# Patient Record
Sex: Female | Born: 1950 | Race: Black or African American | Hispanic: No | Smoking: Former smoker
Health system: Southern US, Community
[De-identification: ages and names within clinical notes are randomized; demographics above are authoritative.]

## PROBLEM LIST (undated history)

## (undated) DIAGNOSIS — I10 Essential (primary) hypertension: Secondary | ICD-10-CM

## (undated) DIAGNOSIS — E119 Type 2 diabetes mellitus without complications: Secondary | ICD-10-CM

## (undated) HISTORY — PX: REPLACEMENT TOTAL KNEE: SUR1224

## (undated) HISTORY — PX: TONSILLECTOMY: SUR1361

---

## 1998-08-17 ENCOUNTER — Encounter: Admission: RE | Admit: 1998-08-17 | Discharge: 1998-11-15 | Payer: Self-pay | Admitting: Internal Medicine

## 1999-12-26 ENCOUNTER — Encounter: Admission: RE | Admit: 1999-12-26 | Discharge: 1999-12-26 | Payer: Self-pay | Admitting: Orthopedic Surgery

## 1999-12-26 ENCOUNTER — Encounter: Payer: Self-pay | Admitting: Orthopedic Surgery

## 2001-02-07 ENCOUNTER — Emergency Department (HOSPITAL_COMMUNITY): Admission: EM | Admit: 2001-02-07 | Discharge: 2001-02-07 | Payer: Self-pay | Admitting: Emergency Medicine

## 2001-02-07 ENCOUNTER — Encounter: Payer: Self-pay | Admitting: Emergency Medicine

## 2001-02-19 ENCOUNTER — Encounter: Payer: Self-pay | Admitting: Internal Medicine

## 2001-02-19 ENCOUNTER — Ambulatory Visit (HOSPITAL_COMMUNITY): Admission: RE | Admit: 2001-02-19 | Discharge: 2001-02-19 | Payer: Self-pay | Admitting: Internal Medicine

## 2001-06-11 ENCOUNTER — Encounter: Payer: Self-pay | Admitting: Internal Medicine

## 2001-06-11 ENCOUNTER — Encounter: Admission: RE | Admit: 2001-06-11 | Discharge: 2001-06-11 | Payer: Self-pay | Admitting: Internal Medicine

## 2001-09-16 ENCOUNTER — Emergency Department (HOSPITAL_COMMUNITY): Admission: EM | Admit: 2001-09-16 | Discharge: 2001-09-16 | Payer: Self-pay | Admitting: Emergency Medicine

## 2001-10-19 ENCOUNTER — Emergency Department (HOSPITAL_COMMUNITY): Admission: EM | Admit: 2001-10-19 | Discharge: 2001-10-19 | Payer: Self-pay | Admitting: *Deleted

## 2002-05-11 ENCOUNTER — Ambulatory Visit (HOSPITAL_COMMUNITY): Admission: RE | Admit: 2002-05-11 | Discharge: 2002-05-11 | Payer: Self-pay | Admitting: Oncology

## 2002-05-11 ENCOUNTER — Encounter (HOSPITAL_COMMUNITY): Payer: Self-pay | Admitting: Oncology

## 2002-05-27 ENCOUNTER — Ambulatory Visit: Admission: RE | Admit: 2002-05-27 | Discharge: 2002-05-27 | Payer: Self-pay | Admitting: Oncology

## 2002-07-03 ENCOUNTER — Ambulatory Visit (HOSPITAL_BASED_OUTPATIENT_CLINIC_OR_DEPARTMENT_OTHER): Admission: RE | Admit: 2002-07-03 | Discharge: 2002-07-03 | Payer: Self-pay | Admitting: Oncology

## 2002-09-09 ENCOUNTER — Encounter: Payer: Self-pay | Admitting: Internal Medicine

## 2002-09-09 ENCOUNTER — Encounter: Admission: RE | Admit: 2002-09-09 | Discharge: 2002-09-09 | Payer: Self-pay | Admitting: Internal Medicine

## 2002-09-23 ENCOUNTER — Other Ambulatory Visit: Admission: RE | Admit: 2002-09-23 | Discharge: 2002-09-23 | Payer: Self-pay | Admitting: Internal Medicine

## 2003-08-19 ENCOUNTER — Encounter: Admission: RE | Admit: 2003-08-19 | Discharge: 2003-08-19 | Payer: Self-pay | Admitting: Orthopedic Surgery

## 2003-11-14 ENCOUNTER — Ambulatory Visit: Admission: RE | Admit: 2003-11-14 | Discharge: 2004-01-08 | Payer: Self-pay | Admitting: *Deleted

## 2003-11-21 ENCOUNTER — Emergency Department (HOSPITAL_COMMUNITY): Admission: EM | Admit: 2003-11-21 | Discharge: 2003-11-21 | Payer: Self-pay | Admitting: *Deleted

## 2005-04-08 ENCOUNTER — Emergency Department (HOSPITAL_COMMUNITY): Admission: EM | Admit: 2005-04-08 | Discharge: 2005-04-08 | Payer: Self-pay | Admitting: Emergency Medicine

## 2007-01-07 ENCOUNTER — Emergency Department (HOSPITAL_COMMUNITY): Admission: EM | Admit: 2007-01-07 | Discharge: 2007-01-07 | Payer: Self-pay | Admitting: Emergency Medicine

## 2007-01-25 ENCOUNTER — Emergency Department (HOSPITAL_COMMUNITY): Admission: EM | Admit: 2007-01-25 | Discharge: 2007-01-25 | Payer: Self-pay | Admitting: Emergency Medicine

## 2007-12-26 ENCOUNTER — Encounter: Admission: RE | Admit: 2007-12-26 | Discharge: 2007-12-26 | Payer: Self-pay | Admitting: Orthopaedic Surgery

## 2008-08-19 ENCOUNTER — Encounter: Admission: RE | Admit: 2008-08-19 | Discharge: 2008-08-19 | Payer: Self-pay | Admitting: Orthopaedic Surgery

## 2008-12-21 ENCOUNTER — Encounter: Admission: RE | Admit: 2008-12-21 | Discharge: 2008-12-21 | Payer: Self-pay | Admitting: Internal Medicine

## 2009-06-17 ENCOUNTER — Emergency Department (HOSPITAL_COMMUNITY): Admission: EM | Admit: 2009-06-17 | Discharge: 2009-06-17 | Payer: Self-pay | Admitting: Emergency Medicine

## 2009-10-23 ENCOUNTER — Inpatient Hospital Stay (HOSPITAL_COMMUNITY): Admission: RE | Admit: 2009-10-23 | Discharge: 2009-10-28 | Payer: Self-pay | Admitting: Orthopaedic Surgery

## 2009-12-25 ENCOUNTER — Ambulatory Visit (HOSPITAL_COMMUNITY): Admission: RE | Admit: 2009-12-25 | Discharge: 2009-12-26 | Payer: Self-pay | Admitting: Orthopaedic Surgery

## 2010-10-21 LAB — COMPREHENSIVE METABOLIC PANEL
ALT: 24 U/L (ref 0–35)
CO2: 27 mEq/L (ref 19–32)
Calcium: 9.1 mg/dL (ref 8.4–10.5)
Chloride: 102 mEq/L (ref 96–112)
Creatinine, Ser: 0.72 mg/dL (ref 0.4–1.2)
GFR calc non Af Amer: >60 mL/min (ref >60)
Glucose, Bld: 167 mg/dL — ABNORMAL HIGH (ref 70–99)
Total Bilirubin: 0.6 mg/dL (ref 0.3–1.2)

## 2010-10-21 LAB — GLUCOSE, CAPILLARY
Glucose-Capillary: 109 mg/dL — ABNORMAL HIGH (ref 70–99)
Glucose-Capillary: 142 mg/dL — ABNORMAL HIGH (ref 70–99)

## 2010-10-25 LAB — CBC
HCT: 40 % (ref 36.0–46.0)
HCT: 42.1 % (ref 36.0–46.0)
HCT: 49.4 % — ABNORMAL HIGH (ref 36.0–46.0)
Hemoglobin: 13.2 g/dL (ref 12.0–15.0)
MCHC: 33.8 g/dL (ref 30.0–36.0)
MCV: 88.4 fL (ref 78.0–100.0)
MCV: 89.1 fL (ref 78.0–100.0)
MCV: 89.4 fL (ref 78.0–100.0)
Platelets: 185 10*3/uL (ref 150–400)
Platelets: 203 10*3/uL (ref 150–400)
Platelets: 223 10*3/uL (ref 150–400)
RBC: 4.11 MIL/uL (ref 3.87–5.11)
RDW: 12.4 % (ref 11.5–15.5)
RDW: 12.7 % (ref 11.5–15.5)
RDW: 12.9 % (ref 11.5–15.5)
WBC: 11.6 10*3/uL — ABNORMAL HIGH (ref 4.0–10.5)
WBC: 12.9 10*3/uL — ABNORMAL HIGH (ref 4.0–10.5)
WBC: 9.1 10*3/uL (ref 4.0–10.5)

## 2010-10-25 LAB — DIFFERENTIAL
Basophils Absolute: 0 10*3/uL (ref 0.0–0.1)
Basophils Relative: 0 % (ref 0–1)
Eosinophils Absolute: 0.1 10*3/uL (ref 0.0–0.7)
Eosinophils Relative: 1 % (ref 0–5)
Lymphocytes Relative: 19 % (ref 12–46)
Lymphs Abs: 1.7 10*3/uL (ref 0.7–4.0)
Monocytes Absolute: 0.6 10*3/uL (ref 0.1–1.0)
Monocytes Relative: 7 % (ref 3–12)
Neutro Abs: 6.7 10*3/uL (ref 1.7–7.7)
Neutrophils Relative %: 73 % (ref 43–77)

## 2010-10-25 LAB — GLUCOSE, CAPILLARY
Glucose-Capillary: 102 mg/dL — ABNORMAL HIGH (ref 70–99)
Glucose-Capillary: 143 mg/dL — ABNORMAL HIGH (ref 70–99)
Glucose-Capillary: 152 mg/dL — ABNORMAL HIGH (ref 70–99)
Glucose-Capillary: 181 mg/dL — ABNORMAL HIGH (ref 70–99)
Glucose-Capillary: 197 mg/dL — ABNORMAL HIGH (ref 70–99)
Glucose-Capillary: 221 mg/dL — ABNORMAL HIGH (ref 70–99)
Glucose-Capillary: 251 mg/dL — ABNORMAL HIGH (ref 70–99)
Glucose-Capillary: 253 mg/dL — ABNORMAL HIGH (ref 70–99)
Glucose-Capillary: 253 mg/dL — ABNORMAL HIGH (ref 70–99)
Glucose-Capillary: 257 mg/dL — ABNORMAL HIGH (ref 70–99)
Glucose-Capillary: 327 mg/dL — ABNORMAL HIGH (ref 70–99)
Glucose-Capillary: 65 mg/dL — ABNORMAL LOW (ref 70–99)
Glucose-Capillary: 77 mg/dL (ref 70–99)
Glucose-Capillary: 90 mg/dL (ref 70–99)
Glucose-Capillary: 92 mg/dL (ref 70–99)

## 2010-10-25 LAB — PROTIME-INR
INR: 1.19 (ref 0.00–1.49)
INR: 2.29 — ABNORMAL HIGH (ref 0.00–1.49)
Prothrombin Time: 14.5 seconds (ref 11.6–15.2)
Prothrombin Time: 15 seconds (ref 11.6–15.2)
Prothrombin Time: 18.7 seconds — ABNORMAL HIGH (ref 11.6–15.2)
Prothrombin Time: 20.8 seconds — ABNORMAL HIGH (ref 11.6–15.2)

## 2010-10-25 LAB — BASIC METABOLIC PANEL
BUN: 14 mg/dL (ref 6–23)
BUN: 14 mg/dL (ref 6–23)
BUN: 9 mg/dL (ref 6–23)
CO2: 30 mEq/L (ref 19–32)
Calcium: 8.5 mg/dL (ref 8.4–10.5)
Calcium: 9.8 mg/dL (ref 8.4–10.5)
Chloride: 97 mEq/L (ref 96–112)
Chloride: 98 mEq/L (ref 96–112)
Creatinine, Ser: 0.66 mg/dL (ref 0.4–1.2)
Creatinine, Ser: 0.86 mg/dL (ref 0.4–1.2)
GFR calc non Af Amer: >60 mL/min (ref >60)
GFR calc non Af Amer: >60 mL/min (ref >60)
GFR calc non Af Amer: >60 mL/min (ref >60)
Glucose, Bld: 144 mg/dL — ABNORMAL HIGH (ref 70–99)
Glucose, Bld: 269 mg/dL — ABNORMAL HIGH (ref 70–99)
Glucose, Bld: 270 mg/dL — ABNORMAL HIGH (ref 70–99)
Potassium: 4 mEq/L (ref 3.5–5.1)
Potassium: 5.3 mEq/L — ABNORMAL HIGH (ref 3.5–5.1)
Sodium: 135 mEq/L (ref 135–145)
Sodium: 136 mEq/L (ref 135–145)

## 2010-10-25 LAB — URINALYSIS, ROUTINE W REFLEX MICROSCOPIC
Bilirubin Urine: NEGATIVE
Glucose, UA: NEGATIVE mg/dL
Hgb urine dipstick: NEGATIVE
Ketones, ur: NEGATIVE mg/dL
Nitrite: NEGATIVE
Protein, ur: NEGATIVE mg/dL
Specific Gravity, Urine: 1.021 (ref 1.005–1.030)
Urobilinogen, UA: 1 mg/dL (ref 0.0–1.0)
pH: 6 (ref 5.0–8.0)

## 2010-10-25 LAB — URINE MICROSCOPIC-ADD ON

## 2011-05-21 LAB — URINALYSIS, ROUTINE W REFLEX MICROSCOPIC
Bilirubin Urine: NEGATIVE
Ketones, ur: NEGATIVE
Nitrite: NEGATIVE
Specific Gravity, Urine: 1.035 — ABNORMAL HIGH
Urobilinogen, UA: 0.2

## 2011-05-21 LAB — BASIC METABOLIC PANEL
BUN: 13
Chloride: 98
Glucose, Bld: 307 — ABNORMAL HIGH
Potassium: 4.4

## 2011-07-10 ENCOUNTER — Encounter (INDEPENDENT_AMBULATORY_CARE_PROVIDER_SITE_OTHER): Payer: Federal, State, Local not specified - PPO | Admitting: Ophthalmology

## 2011-07-10 DIAGNOSIS — H43819 Vitreous degeneration, unspecified eye: Secondary | ICD-10-CM

## 2011-07-10 DIAGNOSIS — E11319 Type 2 diabetes mellitus with unspecified diabetic retinopathy without macular edema: Secondary | ICD-10-CM

## 2011-07-10 DIAGNOSIS — E1139 Type 2 diabetes mellitus with other diabetic ophthalmic complication: Secondary | ICD-10-CM

## 2012-02-10 ENCOUNTER — Other Ambulatory Visit (HOSPITAL_COMMUNITY)
Admission: RE | Admit: 2012-02-10 | Discharge: 2012-02-10 | Disposition: A | Discharge status: Home / Self Care | Payer: Federal, State, Local not specified - PPO | Source: Ambulatory Visit | Attending: Internal Medicine | Admitting: Internal Medicine

## 2012-02-10 DIAGNOSIS — Z01419 Encounter for gynecological examination (general) (routine) without abnormal findings: Secondary | ICD-10-CM | POA: Insufficient documentation

## 2013-07-28 ENCOUNTER — Emergency Department (HOSPITAL_COMMUNITY)
Admission: EM | Admit: 2013-07-28 | Discharge: 2013-07-28 | Disposition: A | Discharge status: Home / Self Care | Payer: Federal, State, Local not specified - PPO | Attending: Emergency Medicine | Admitting: Emergency Medicine

## 2013-07-28 ENCOUNTER — Emergency Department (HOSPITAL_COMMUNITY): Payer: Federal, State, Local not specified - PPO

## 2013-07-28 ENCOUNTER — Encounter (HOSPITAL_COMMUNITY): Payer: Self-pay | Admitting: Emergency Medicine

## 2013-07-28 DIAGNOSIS — Z794 Long term (current) use of insulin: Secondary | ICD-10-CM | POA: Insufficient documentation

## 2013-07-28 DIAGNOSIS — J069 Acute upper respiratory infection, unspecified: Secondary | ICD-10-CM | POA: Insufficient documentation

## 2013-07-28 DIAGNOSIS — Z9089 Acquired absence of other organs: Secondary | ICD-10-CM | POA: Insufficient documentation

## 2013-07-28 DIAGNOSIS — E119 Type 2 diabetes mellitus without complications: Secondary | ICD-10-CM | POA: Insufficient documentation

## 2013-07-28 DIAGNOSIS — I10 Essential (primary) hypertension: Secondary | ICD-10-CM | POA: Insufficient documentation

## 2013-07-28 DIAGNOSIS — Z880 Allergy status to penicillin: Secondary | ICD-10-CM | POA: Insufficient documentation

## 2013-07-28 DIAGNOSIS — R110 Nausea: Secondary | ICD-10-CM | POA: Insufficient documentation

## 2013-07-28 DIAGNOSIS — Z79899 Other long term (current) drug therapy: Secondary | ICD-10-CM | POA: Insufficient documentation

## 2013-07-28 DIAGNOSIS — R0789 Other chest pain: Secondary | ICD-10-CM | POA: Insufficient documentation

## 2013-07-28 HISTORY — DX: Essential (primary) hypertension: I10

## 2013-07-28 HISTORY — DX: Type 2 diabetes mellitus without complications: E11.9

## 2013-07-28 LAB — BASIC METABOLIC PANEL
BUN: 15 mg/dL (ref 6–23)
Creatinine, Ser: 0.69 mg/dL (ref 0.50–1.10)
GFR calc Af Amer: >90 mL/min (ref >90)
GFR calc non Af Amer: >90 mL/min (ref >90)

## 2013-07-28 LAB — CBC
HCT: 48.6 % — ABNORMAL HIGH (ref 36.0–46.0)
MCH: 30.3 pg (ref 26.0–34.0)
MCHC: 34.8 g/dL (ref 30.0–36.0)
MCV: 87.3 fL (ref 78.0–100.0)
RDW: 12.1 % (ref 11.5–15.5)

## 2013-07-28 MED ORDER — IPRATROPIUM BROMIDE 0.02 % IN SOLN
0.5000 mg | Freq: Once | RESPIRATORY_TRACT | Status: AC
  Administered 2013-07-28: 0.5 mg via RESPIRATORY_TRACT
  Filled 2013-07-28: qty 2.5

## 2013-07-28 MED ORDER — BENZONATATE 100 MG PO CAPS
200.0000 mg | ORAL_CAPSULE | Freq: Once | ORAL | Status: AC
  Administered 2013-07-28: 200 mg via ORAL
  Filled 2013-07-28: qty 2

## 2013-07-28 MED ORDER — ALBUTEROL SULFATE HFA 108 (90 BASE) MCG/ACT IN AERS: 2.0000 | INHALATION_SPRAY | RESPIRATORY_TRACT | Status: DC | PRN

## 2013-07-28 MED ORDER — ASPIRIN 325 MG PO TABS
325.0000 mg | ORAL_TABLET | ORAL | Status: AC
  Administered 2013-07-28: 325 mg via ORAL
  Filled 2013-07-28: qty 1

## 2013-07-28 MED ORDER — ALBUTEROL SULFATE (5 MG/ML) 0.5% IN NEBU
2.5000 mg | INHALATION_SOLUTION | Freq: Once | RESPIRATORY_TRACT | Status: AC
  Administered 2013-07-28: 2.5 mg via RESPIRATORY_TRACT
  Filled 2013-07-28: qty 0.5

## 2013-07-28 MED ORDER — HYDROCOD POLST-CHLORPHEN POLST 10-8 MG/5ML PO LQCR: 5.0000 mL | Freq: Two times a day (BID) | ORAL | Status: DC | PRN

## 2013-07-28 NOTE — ED Notes (Signed)
Family at bedside. 

## 2013-07-28 NOTE — ED Notes (Signed)
Patient presents today with a chief complaint of shortness of breath, chest congestion, chest tightness, and nausea since Monday 07/25/13. Patient denies fever, vomiting, abdominal pain.  Patient has been taking Delsym OTC without relief of cough.

## 2013-07-28 NOTE — ED Provider Notes (Signed)
CSN: [id number]     Arrival date & time 07/28/13  1643 History   First MD Initiated Contact with Patient 07/28/13 1709     Chief Complaint  Patient presents with  . Chest Pain  . Cough   (Consider location/radiation/quality/duration/timing/severity/associated sxs/prior Treatment) HPI  62 year old female with a history of hypertension and diabetes presents today with chief complaint of shortness of breath chest congestion and chest tightness and nausea since Monday. Patient has had these symptoms ongoing for approximately 4 days now. She has a cough with no production. With her cough she feels a tightness in her chest and has had some mild nausea. Also with her cuff has had some associated shortness of breath. Fever or vomiting. She denies any abdominal pain. She's been taking over-the-counter decongestants without any relief. She presents today for continued symptoms. Patient has also had some generalized myalgias which she states are chronic.  Past Medical History  Diagnosis Date  . Hypertension   . Diabetes mellitus without complication    Past Surgical History  Procedure Laterality Date  . Replacement total knee    . Tonsillectomy     No family history on file. History  Substance Use Topics  . Smoking status: Never Smoker   . Smokeless tobacco: Not on file  . Alcohol Use: Yes   OB History   Grav Para Term Preterm Abortions TAB SAB Ect Mult Living                 Review of Systems  Constitutional: Negative for fever and chills.  HENT: Negative for sore throat.   Eyes: Negative for pain.  Respiratory: Positive for cough, chest tightness and shortness of breath.   Cardiovascular: Negative for chest pain.  Gastrointestinal: Positive for nausea. Negative for vomiting, abdominal pain and diarrhea.  Genitourinary: Negative for dysuria.  Musculoskeletal: Negative for back pain.  Skin: Negative for rash.  Neurological: Negative for numbness and headaches.    Allergies   Amoxicillin and Penicillins  Home Medications   Current Outpatient Rx  Name  Route  Sig  Dispense  Refill  . amLODipine (NORVASC) 10 MG tablet   Oral   Take 10 mg by mouth daily.         Marland Kitchen dextromethorphan (DELSYM) 30 MG/5ML liquid   Oral   Take 30 mg by mouth as needed for cough.         . Diclofenac Sodium POWD   Topical   Apply 1 application topically 3 (three) times daily as needed.         . glyBURIDE-metformin (GLUCOVANCE) 5-500 MG per tablet   Oral   Take 1 tablet by mouth 3 (three) times daily.         Marland Kitchen guaiFENesin (MUCINEX) 600 MG 12 hr tablet   Oral   Take by mouth 2 (two) times daily.         Marland Kitchen LANTUS SOLOSTAR 100 UNIT/ML SOPN   Subcutaneous   Inject 50 Units into the skin at bedtime.         Marland Kitchen lisinopril (PRINIVIL,ZESTRIL) 20 MG tablet   Oral   Take 20 mg by mouth daily.         . pravastatin (PRAVACHOL) 40 MG tablet   Oral   Take 40 mg by mouth daily.         Marland Kitchen albuterol (PROVENTIL HFA;VENTOLIN HFA) 108 (90 BASE) MCG/ACT inhaler   Inhalation   Inhale 2 puffs into the lungs every 4 (four) hours as  needed for wheezing or shortness of breath.   1 Inhaler   1   . chlorpheniramine-HYDROcodone (TUSSIONEX PENNKINETIC ER) 10-8 MG/5ML LQCR   Oral   Take 5 mLs by mouth every 12 (twelve) hours as needed for cough.   115 mL   0    BP 146/62  Pulse 70  Temp(Src) 98.4 F (36.9 C) (Oral)  Resp 9  Ht 5\' 3"  (1.6 m)  Wt 264 lb 12.8 oz (120.112 kg)  BMI 46.92 kg/m2  SpO2 98% Physical Exam  Constitutional: She is oriented to person, place, and time. She appears well-developed and well-nourished. No distress.  HENT:  Head: Normocephalic and atraumatic.  Nose: Mucosal edema present.  Eyes: Pupils are equal, round, and reactive to light. Right eye exhibits no discharge. Left eye exhibits no discharge.  Neck: Normal range of motion.  Cardiovascular: Normal rate, regular rhythm and normal heart sounds.   Pulmonary/Chest: Effort normal and  breath sounds normal. Not tachypneic. No respiratory distress. She has no decreased breath sounds. She has no wheezes. She has no rhonchi.  Abdominal: Soft. She exhibits no distension. There is no tenderness.  Musculoskeletal: Normal range of motion.  Neurological: She is alert and oriented to person, place, and time.  Skin: Skin is warm. She is not diaphoretic.   ED Course  Procedures (including critical care time) Labs Review Labs Reviewed  CBC - Abnormal; Notable for the following:    RBC 5.57 (*)    Hemoglobin 16.9 (*)    HCT 48.6 (*)    All other components within normal limits  BASIC METABOLIC PANEL - Abnormal; Notable for the following:    Glucose, Bld 236 (*)    All other components within normal limits  PRO B NATRIURETIC PEPTIDE  POCT I-STAT TROPONIN I   Imaging Review Dg Chest 2 View  07/28/2013   CLINICAL DATA:  Chest tightness.  Cough for 3 days.  EXAM: CHEST  2 VIEW  COMPARISON:  None.  FINDINGS: Cardiopericardial silhouette within normal limits. Aortic arch atherosclerosis is present. There is no airspace disease or pleural effusion. Trachea midline.  IMPRESSION: No active cardiopulmonary disease.   Electronically Signed   By: Andreas Newport M.D.   On: 07/28/2013 18:01    EKG Interpretation    Date/Time:  Thursday July 28 2013 16:48:08 EST Ventricular Rate:  90 PR Interval:  178 QRS Duration: 72 QT Interval:  340 QTC Calculation: 415 R Axis:   81 Text Interpretation:  Sinus rhythm with occasional Premature ventricular complexes ST \\T \ T wave abnormality, consider inferior ischemia ST \\T \ T wave abnormality, consider anterolateral ischemia Abnormal ECG Baseline wander When compared with ECG of 10/18/2009, T wave inversion is now Present Inferior leads Confirmed by [facility]  MD, DAVID ([id number]) on 07/28/2013 5:26:50 PM            MDM   1. Viral URI with cough    62 year old female with a history of diabetes presents today with complaints of shortness of  breath chest congestion, chest tightness, nausea. Patient without any fever, vomiting, abdominal pain.  Patient with a mild prolonged exhalation phase and some mild wheezing appreciated on exam. EKG as above demonstrates a new T wave inversions as compared to EKG from 3 years ago. Patient given trial of duoneb, with mild improvement in symptoms. Patient feels comfortable with discharge. Likely viral URI. Doubt ACS, PNA, esophageal pathology, PE. Recommended albuterol inhaler, though patient states she does not want to use it. Discharged with tussinex, albuterol  inhaler rxs. Strict return precautions given. Patient seen and evaluated by myself and my attending, Dr. Preston Fleeting.     Imagene Sheller, MD 07/28/13 [phone]

## 2013-07-28 NOTE — ED Notes (Signed)
Patient transported to X-ray 

## 2013-07-28 NOTE — ED Notes (Signed)
Pt ambulated to restroom and back to room unassisted with steady gait. Pt denies increased shortness of breath, pt O2 saturation 95% on RA

## 2013-07-28 NOTE — ED Provider Notes (Signed)
62 year old female history of diabetes comes in with a three-day history of a nonproductive cough and tight feeling in her chest. A tight feeling is constant her cough is worse when supine. She denies fever chills. No sick contacts. There was some slight improvement with over-the-counter Delsym, but symptoms started getting worse in spite of continuing the medication. On exam, lungs have a slightly prolonged exhalation phase and some wheezing is noted with forced exhalation. Chest x-ray is unremarkable as is laboratory workup. ECG has new T-wave inversions the inferior leads but this is compared to a tracing of several years old. Her symptoms are not consistent with cardiac disease. You begin a therapeutic trial of albuterol with ipratropium. However, she should have followup with regarding the change in her ECG and may need to consider outpatient stress testing.  I saw and evaluated the patient, reviewed the resident's note and I agree with the findings and plan.  EKG Interpretation    Date/Time:  Thursday July 28 2013 16:48:08 EST Ventricular Rate:  90 PR Interval:  178 QRS Duration: 72 QT Interval:  340 QTC Calculation: 415 R Axis:   81 Text Interpretation:  Sinus rhythm with occasional Premature ventricular complexes ST \\T \ T wave abnormality, consider inferior ischemia ST \\T \ T wave abnormality, consider anterolateral ischemia Abnormal ECG Baseline wander When compared with ECG of 10/18/2009, T wave inversion is now Present Inferior leads Confirmed by Preston Fleeting  MD, Essynce Munsch ([id number]) on 07/28/2013 5:26:50 PM              Dione Booze, MD 07/28/13 1940

## 2014-03-01 DIAGNOSIS — B182 Chronic viral hepatitis C: Secondary | ICD-10-CM | POA: Insufficient documentation

## 2014-05-01 ENCOUNTER — Other Ambulatory Visit: Payer: Self-pay | Admitting: Internal Medicine

## 2014-05-01 DIAGNOSIS — N644 Mastodynia: Secondary | ICD-10-CM

## 2014-05-05 ENCOUNTER — Ambulatory Visit
Admission: RE | Admit: 2014-05-05 | Discharge: 2014-05-05 | Disposition: A | Discharge status: Home / Self Care | Payer: Federal, State, Local not specified - PPO | Source: Ambulatory Visit | Attending: Internal Medicine | Admitting: Internal Medicine

## 2014-05-05 DIAGNOSIS — N644 Mastodynia: Secondary | ICD-10-CM

## 2016-01-24 DIAGNOSIS — E785 Hyperlipidemia, unspecified: Secondary | ICD-10-CM | POA: Insufficient documentation

## 2016-01-24 DIAGNOSIS — I10 Essential (primary) hypertension: Secondary | ICD-10-CM | POA: Insufficient documentation

## 2016-01-24 DIAGNOSIS — E782 Mixed hyperlipidemia: Secondary | ICD-10-CM | POA: Insufficient documentation

## 2016-02-06 ENCOUNTER — Ambulatory Visit (HOSPITAL_COMMUNITY)
Admission: RE | Admit: 2016-02-06 | Discharge: 2016-02-06 | Disposition: A | Discharge status: Home / Self Care | Payer: Federal, State, Local not specified - PPO | Source: Ambulatory Visit | Attending: Orthopaedic Surgery | Admitting: Orthopaedic Surgery

## 2016-02-06 ENCOUNTER — Other Ambulatory Visit (HOSPITAL_COMMUNITY): Payer: Self-pay | Admitting: Orthopaedic Surgery

## 2016-02-06 DIAGNOSIS — I10 Essential (primary) hypertension: Secondary | ICD-10-CM | POA: Insufficient documentation

## 2016-02-06 DIAGNOSIS — E119 Type 2 diabetes mellitus without complications: Secondary | ICD-10-CM | POA: Diagnosis not present

## 2016-02-06 DIAGNOSIS — M7989 Other specified soft tissue disorders: Secondary | ICD-10-CM | POA: Diagnosis not present

## 2016-02-06 DIAGNOSIS — M79604 Pain in right leg: Secondary | ICD-10-CM | POA: Diagnosis not present

## 2016-02-06 DIAGNOSIS — M79661 Pain in right lower leg: Secondary | ICD-10-CM

## 2016-02-06 NOTE — Progress Notes (Signed)
*  Preliminary Results* Right lower extremity venous duplex completed. Study was technically difficult due to edema and patient body habitus. Visualized veins of the right lower extremity are negative for deep vein thrombosis. There is no evidence of right Baker's cyst.  Preliminary results discussed with Caryl Pina of Dr. Trevor Mace office.  02/06/2016 11:16 AM  Maudry Mayhew, RVT, RDCS, RDMS

## 2017-04-14 ENCOUNTER — Encounter: Payer: Federal, State, Local not specified - PPO | Attending: Internal Medicine | Admitting: Internal Medicine

## 2017-04-14 ENCOUNTER — Other Ambulatory Visit
Admission: RE | Admit: 2017-04-14 | Discharge: 2017-04-14 | Disposition: A | Discharge status: Home / Self Care | Payer: Federal, State, Local not specified - PPO | Source: Ambulatory Visit | Attending: Internal Medicine | Admitting: Internal Medicine

## 2017-04-14 DIAGNOSIS — Y839 Surgical procedure, unspecified as the cause of abnormal reaction of the patient, or of later complication, without mention of misadventure at the time of the procedure: Secondary | ICD-10-CM | POA: Insufficient documentation

## 2017-04-14 DIAGNOSIS — L97111 Non-pressure chronic ulcer of right thigh limited to breakdown of skin: Secondary | ICD-10-CM | POA: Insufficient documentation

## 2017-04-14 DIAGNOSIS — E119 Type 2 diabetes mellitus without complications: Secondary | ICD-10-CM | POA: Insufficient documentation

## 2017-04-14 DIAGNOSIS — Z880 Allergy status to penicillin: Secondary | ICD-10-CM | POA: Insufficient documentation

## 2017-04-14 DIAGNOSIS — M1990 Unspecified osteoarthritis, unspecified site: Secondary | ICD-10-CM | POA: Diagnosis not present

## 2017-04-14 DIAGNOSIS — I10 Essential (primary) hypertension: Secondary | ICD-10-CM | POA: Insufficient documentation

## 2017-04-14 DIAGNOSIS — Z794 Long term (current) use of insulin: Secondary | ICD-10-CM | POA: Diagnosis not present

## 2017-04-14 DIAGNOSIS — B999 Unspecified infectious disease: Secondary | ICD-10-CM | POA: Insufficient documentation

## 2017-04-14 DIAGNOSIS — Z87891 Personal history of nicotine dependence: Secondary | ICD-10-CM | POA: Insufficient documentation

## 2017-04-14 DIAGNOSIS — B182 Chronic viral hepatitis C: Secondary | ICD-10-CM | POA: Diagnosis not present

## 2017-04-14 DIAGNOSIS — T8131XD Disruption of external operation (surgical) wound, not elsewhere classified, subsequent encounter: Secondary | ICD-10-CM | POA: Insufficient documentation

## 2017-04-14 DIAGNOSIS — L02415 Cutaneous abscess of right lower limb: Secondary | ICD-10-CM | POA: Diagnosis not present

## 2017-04-16 NOTE — Progress Notes (Signed)
SHAVELLE, RUNKEL ([id number]) Visit Report for 04/14/2017 Chief Complaint Document Details Patient Name: Regina Mckay, Regina Mckay. Date of Service: 04/14/2017 10:30 AM Medical Record Patient Account Number: [id number] Number: Treating RN: Ahmed Prima [birth year] (66 y.o. Other Clinician: Date of Birth/Sex: Female) Treating Shanik [place]shire Primary Care Provider: Jani Gravel Provider/Extender: G Referring Provider: Carney Bern in Treatment: 0 Information Obtained from: Patient Chief Complaint 04/14/17; patient is here for review of a surgical IandD site in her right medial upper thigh Electronic Signature(s) Signed: 04/14/2017 3:57:41 PM By: Linton Ham MD Entered By: Linton Ham on 04/14/2017 12:20:08 [name] ([id number]) -------------------------------------------------------------------------------- HPI Details Patient Name: Regina Mckay. Date of Service: 04/14/2017 10:30 AM Medical Record Patient Account Number: [id number] Number: Treating RN: Ahmed Prima 1951-01-29 (66 y.o. Other Clinician: Date of Birth/Sex: Female) Treating Kaisy Severino Primary Care Provider: Jani Gravel Provider/Extender: G Referring Provider: Carney Bern in Treatment: 0 History of Present Illness HPI Description: 04/14/17; this is a 66 year old woman who is a type II diabetic on insulin. She presented to an urgent care on [address] in [place] on 9/4 with a 4 day history of a painful area on the right upper thigh. This apparently was already draining and underwent an IandD with 2 meals of "frank pus" obtained. The wound was packed with dry gauze and a BD pads and Xeroform. She was prescribed doxycycline for 10 days which she is still taking. She is here for our review of the surgical IandD site. She apparently had a similar area in April and saw the same doctor in the same location. There is no reference to a culture result from that time  frame. Culture results from this occasion showed negative anaerobic culture and negative aerobic culture. She is still taken the doxycycline. The patient does not give a history of recurrent skin abscesses other than the problem in April of this year. No noted areas in her axilla inframammary or groin area. She is a type II diabetic apparently with a last hemoglobin A1c of 8. There was very little to see and [place] link on this patient. She had cellulitis in the left leg in 2017 and a vascular ultrasound that was negative for DVT in 2017. She is listed as having chronic hep C Electronic Signature(s) Signed: 04/14/2017 3:57:41 PM By: Linton Ham MD Entered By: Linton Ham on 04/14/2017 12:23:56 [name] ([id number]) -------------------------------------------------------------------------------- Physical Exam Details Patient Name: Regina Mckay. Date of Service: 04/14/2017 10:30 AM Medical Record Patient Account Number: [id number] Number: Treating RN: Ahmed Prima Jun 30, 1951 (66 y.o. Other Clinician: Date of Birth/Sex: Female) Treating Kayn Haymore Primary Care Provider: Jani Gravel Provider/Extender: G Referring Provider: Carney Bern in Treatment: 0 Constitutional Patient is hypertensive.. Pulse regular and within target range for patient.Marland Kitchen Respirations regular, non-labored and within target range.. Temperature is normal and within the target range for the patient.Marland Kitchen appears in no distress. Eyes Conjunctivae clear. No discharge. Respiratory Respiratory effort is easy and symmetric bilaterally. Rate is normal at rest and on room air.. Bilateral breath sounds are clear and equal in all lobes with no wheezes, rales or rhonchi.. Cardiovascular Heart rhythm and rate regular, without murmur or gallop.. Femoral arteries without bruits and pulses strong.. Gastrointestinal (GI) Abdomen is soft and non-distended without masses or tenderness. Bowel  sounds active in all quadrants.. No liver or spleen enlargement or tenderness.. Genitourinary (GU) Bladder without fullness, masses or tenderness.. Lymphatic None palpable in the right inguinal area. Neurological Vibration  sense seemed to be normal in the right leg. Psychiatric No evidence of depression, anxiety, or agitation. Calm, cooperative, and communicative. Appropriate interactions and affect.. Notes Wound exam; small probing IandD site in the right upper medial thigh. Below this she has what appears to be tape injury from removing the dressing this is superficial. The entire area is firm but nontender a bit unusual if this was a chronic infection. A specimen for CNS was obtained but no particular drainage or tenderness was noted Electronic Signature(s) Signed: 04/14/2017 3:57:41 PM By: Linton Ham MD Entered By: Linton Ham on 04/14/2017 12:25:36 [name] ([id number]) -------------------------------------------------------------------------------- Physician Orders Details Patient Name: Mckay, Regina. Date of Service: 04/14/2017 10:30 AM Medical Record Patient Account Number: [id number] Number: Treating RN: Ahmed Prima 1951/02/14 (66 y.o. Other Clinician: Date of Birth/Sex: Female) Treating Oluwafemi Villella Primary Care Provider: Jani Gravel Provider/Extender: G Referring Provider: Carney Bern in Treatment: 0 Verbal / Phone Orders: Yes Clinician: Pinkerton, Debi Read Back and Verified: Yes Diagnosis Coding Wound Cleansing Wound #1 Right,Proximal,Medial Upper Leg o Clean wound with Normal Saline. Wound #2 Right,Distal,Medial Upper Leg o Clean wound with Normal Saline. Anesthetic Wound #1 Right,Proximal,Medial Upper Leg o Topical Lidocaine 4% cream applied to wound bed prior to debridement Wound #2 Right,Distal,Medial Upper Leg o Topical Lidocaine 4% cream applied to wound bed prior to debridement Skin  Barriers/Peri-Wound Care Wound #1 Right,Proximal,Medial Upper Leg o Skin Prep Wound #2 Right,Distal,Medial Upper Leg o Skin Prep Primary Wound Dressing Wound #1 Right,Proximal,Medial Upper Leg o Other: - SilverCel Rope Wound #2 Right,Distal,Medial Upper Leg o Other: - SilverCel Secondary Dressing Wound #1 Right,Proximal,Medial Upper Leg o Dry Gauze o Non-adherent pad - telfa island Wound #2 Right,Distal,Medial Upper Leg TALAYAH, PICARDI. ([id number]) o Dry Gauze o Non-adherent pad - telfa island Dressing Change Frequency Wound #1 Right,Proximal,Medial Upper Leg o Change dressing every other day. Wound #2 Right,Distal,Medial Upper Leg o Change dressing every other day. Follow-up Appointments Wound #1 Right,Proximal,Medial Upper Leg o Return Appointment in 1 week. Wound #2 Right,Distal,Medial Upper Leg o Return Appointment in 1 week. Additional Orders / Instructions Wound #1 Right,Proximal,Medial Upper Leg o Increase protein intake. Wound #2 Right,Distal,Medial Upper Leg o Increase protein intake. Medications-please add to medication list. Wound #1 Right,Proximal,Medial Upper Leg o Other: - Vitamin C, Zinc, Multivitamin Wound #2 Right,Distal,Medial Upper Leg o Other: - Vitamin C, Zinc, Multivitamin Laboratory o Bacteria identified in Wound by Culture (MICRO) - right proximal medial upper leg oooo LOINC Code: 2536-6 oooo Convenience Name: Wound culture routine Electronic Signature(s) Signed: 04/15/2017 4:34:43 PM By: Linton Ham MD Signed: 04/15/2017 4:54:17 PM By: Alric Quan Previous Signature: 04/14/2017 3:57:41 PM Version By: Linton Ham MD Entered By: Alric Quan on 04/15/2017 14:21:54 [name] ([id number]) -------------------------------------------------------------------------------- Problem List Details Patient Name: KOSISOCHUKWU, GOLDBERG. Date of Service: 04/14/2017 10:30 AM Medical Record Patient Account Number:  [id number] Number: Treating RN: Ahmed Prima 1951/02/25 (66 y.o. 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 Date of Service: 04/14/2017 10:30 AM Medical Record Patient Account Number: [id number] Number: Treating RN: Ahmed Prima Jan 29, 1951 (66 y.o. Other Clinician: Date of Birth/Sex: Female) Treating Antonieta Slaven Primary Care Provider: Jani Gravel Provider/Extender: G Referring Provider: Carney Bern in Treatment: 0 Subjective Chief Complaint Information obtained from Patient 04/14/17; patient is here for review of a surgical IandD site in her right medial upper thigh History of Present Illness (HPI) 04/14/17; this is a 66 year old woman who is a type II diabetic on insulin. She presented to an urgent care on [address] in [place] on 9/4 with a 4 day history of a painful area on the right upper thigh. This apparently was already draining and underwent an IandD with 2 meals of "frank pus" obtained. The wound was packed with dry gauze and a BD pads and Xeroform. She was prescribed doxycycline for 10 days which she is still  taking. She is here for our review of the surgical IandD site. She apparently had a similar area in April and saw the same doctor in the same location. There is no reference to a culture result from that time frame. Culture results from this occasion showed negative anaerobic culture and negative aerobic culture. She is still taken the doxycycline. The patient does not give a history of recurrent skin abscesses other than the problem in April of this year. No noted areas in her axilla inframammary or groin area. She is a type II diabetic apparently with a last hemoglobin A1c of 8. There was very little to see and [place] link on this patient. She had cellulitis in the left leg in 2017 and a vascular ultrasound that was negative for DVT in 2017. She is listed as having chronic hep C Wound History Patient presents with 1 open wound that has been present for approximately 04/07/17. Patient has been treating wound in the following manner: packing at urgent care. Laboratory tests have not been performed in the last month. Patient reportedly has tested positive for an antibiotic resistant organism. Patient reportedly has not tested positive for osteomyelitis. Patient reportedly has not had testing performed to evaluate circulation in the legs. Patient experiences the following problems associated with their wounds: swelling. Patient History Information obtained from Patient. Allergies penicillin VICKYE, ASTORINO ([id number]) Family History Diabetes - Mother, Siblings, Hypertension - Father, Lung Disease - Father, Stroke - Mother, No family history of Cancer, Heart Disease, Hereditary Spherocytosis, Kidney Disease, Seizures, Thyroid Problems, Tuberculosis. Social History Former smoker - back when she was 56-19, Marital Status - Divorced, Alcohol Use - Rarely, Drug Use - No History, Caffeine Use - Daily. Medical History Eyes Patient has history of Cataracts - surgery Cardiovascular Patient has  history of Hypertension Endocrine Patient has history of Type II Diabetes Musculoskeletal Patient has history of Osteoarthritis Patient is treated with Insulin, Oral Agents. Blood sugar is tested. Review of Systems (ROS) Constitutional Symptoms (General Health) The patient has no complaints or symptoms. Eyes Complains or has symptoms of Glasses / Contacts. Ear/Nose/Mouth/Throat The patient has no complaints or symptoms. Hematologic/Lymphatic The patient has no complaints or symptoms. Respiratory The patient has no complaints or symptoms. Gastrointestinal The patient has no complaints or symptoms. Genitourinary The patient has no complaints or symptoms. Immunological The patient has no complaints or symptoms. Integumentary (Skin) Complains or has symptoms of Wounds. Neurologic The patient has no complaints or symptoms. Oncologic The patient has no complaints or symptoms. Psychiatric The patient  has no complaints or symptoms. LIVIA, TARR. ([id number]) Objective Constitutional Patient is hypertensive.. Pulse regular and within target range for patient.Marland Kitchen Respirations regular, non-labored and within target range.. Temperature is normal and within the target range for the patient.Marland Kitchen appears in no distress. Vitals Time Taken: 10:37 AM, Height: 63 in, Source: Stated, Weight: 255.1 lbs, Source: Measured, BMI: 45.2, Temperature: 98.5 F, Pulse: 76 bpm, Respiratory Rate: 18 breaths/min, Blood Pressure: 158/67 mmHg. Eyes Conjunctivae clear. No discharge. Respiratory Respiratory effort is easy and symmetric bilaterally. Rate is normal at rest and on room air.. Bilateral breath sounds are clear and equal in all lobes with no wheezes, rales or rhonchi.. Cardiovascular Heart rhythm and rate regular, without murmur or gallop.. Femoral arteries without bruits and pulses strong.. Gastrointestinal (GI) Abdomen is soft and non-distended without masses or tenderness. Bowel sounds active  in all quadrants.. No liver or spleen enlargement or tenderness.. Genitourinary (GU) Bladder without fullness, masses or tenderness.. Lymphatic None palpable in the right inguinal area. Neurological Vibration sense seemed to be normal in the right leg. Psychiatric No evidence of depression, anxiety, or agitation. Calm, cooperative, and communicative. Appropriate interactions and affect.. General Notes: Wound exam; small probing IandD site in the right upper medial thigh. Below this she has what appears to be tape injury from removing the dressing this is superficial. The entire area is firm but nontender a bit unusual if this was a chronic infection. A specimen for CNS was obtained but no particular drainage or tenderness was noted Integumentary (Hair, Skin) Wound #1 status is Open. Original cause of wound was Gradually Appeared. The wound is located on the JASMON, MATTICE. ([id number]) Right,Proximal,Medial Upper Leg. The wound measures 0.5cm length x 1.5cm width x 1.2cm depth; 0.589cm^2 area and 0.707cm^3 volume. There is no tunneling or undermining noted. There is a large amount of serosanguineous drainage noted. The wound margin is distinct with the outline attached to the wound base. There is medium (34-66%) red granulation within the wound bed. There is a medium (34-66%) amount of necrotic tissue within the wound bed including Adherent Slough. Periwound temperature was noted as No Abnormality. The periwound has tenderness on palpation. Wound #2 status is Open. Original cause of wound was Gradually Appeared. The wound is located on the Right,Distal,Medial Upper Leg. The wound measures 0.3cm length x 2cm width x 0.1cm depth; 0.471cm^2 area and 0.047cm^3 volume. There is a large amount of serosanguineous drainage noted. The wound margin is distinct with the outline attached to the wound base. There is large (67-100%) red granulation within the wound bed. There is no necrotic tissue  within the wound bed. Periwound temperature was noted as No Abnormality. The periwound has tenderness on palpation. 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 Wound #2 Right,Distal,Medial Upper Leg: Clean wound with Normal Saline. Anesthetic: Wound #1 Right,Proximal,Medial Upper Leg: Topical Lidocaine 4% cream applied to wound bed prior to debridement Wound #2 Right,Distal,Medial Upper Leg: Topical Lidocaine 4% cream applied to wound bed prior to debridement Skin Barriers/Peri-Wound Care: Wound #1 Right,Proximal,Medial Upper Leg: Skin Prep Wound #2 Right,Distal,Medial Upper Leg: Skin Prep Primary Wound Dressing: ASIYA, CUTBIRTH ([id number]) Wound #1 Right,Proximal,Medial Upper Leg: Other: - SilverCel Rope Wound #2 Right,Distal,Medial Upper Leg: Other: - SilverCel Secondary Dressing:  Wound #1 Right,Proximal,Medial Upper Leg: Dry Gauze Non-adherent pad - telfa island Wound #2 Right,Distal,Medial Upper Leg: Dry Gauze Non-adherent pad - telfa island Dressing Change Frequency: Wound #1 Right,Proximal,Medial Upper Leg: Change dressing every other day. Wound #2 Right,Distal,Medial Upper Leg: Change dressing every other day. Follow-up Appointments: Wound #1 Right,Proximal,Medial Upper Leg: Return Appointment in 1 week. Wound #2 Right,Distal,Medial Upper Leg: Return Appointment in 1 week. Additional Orders / Instructions: Wound #1 Right,Proximal,Medial Upper Leg: Increase protein intake. Wound #2 Right,Distal,Medial Upper Leg: Increase protein intake. Medications-please add to medication list.: Wound #1 Right,Proximal,Medial Upper Leg: Other: - Vitamin  C, Zinc, Multivitamin Wound #2 Right,Distal,Medial Upper Leg: Other: - Vitamin C, Zinc, Multivitamin #1 silver alginate packing and silver alginate to the tape injury below it. #2 Telfa was the secondary dressing #3 the area surrounding this was firm but without any tenderness. If this was not an abscess one would wonder about a sebaceous cyst. #4 the patient expressed concerns about whether she will be able to dress this area given her size and lack of a person at home to help her. Hopefully she'll be able to do this. Electronic Signature(s) VINISHA, FAXON ([id number]) Signed: 04/14/2017 3:57:41 PM By: Linton Ham MD Entered By: Linton Ham on 04/14/2017 12:27:35 FINA, HEIZER ([id number]) -------------------------------------------------------------------------------- ROS/PFSH Details Patient Name: NYX, KEADY. Date of Service: 04/14/2017 10:30 AM Medical Record Patient Account Number: [id number] Number: Treating RN: Ahmed Prima 12-02-1950 (66 y.o. Other Clinician: Date of Birth/Sex: Female) Treating Katerina Zurn, [place] Primary Care Provider: Jani Gravel Provider/Extender: G Referring Provider: Carney Bern in Treatment: 0 Information Obtained From Patient Wound History Do you currently have one or more open woundso Yes How many open wounds do you currently haveo 1 Approximately how long have you had your woundso 04/07/17 How have you been treating your wound(s) until nowo packing at urgent care Has your wound(s) ever healed and then re-openedo No Have you had any lab work done in the past montho No Have you tested positive for an antibiotic resistant organism (MRSA, VRE)o Yes Have you tested positive for osteomyelitis (bone infection)o No Have you had any tests for circulation on your legso No Have you had other problems associated with your woundso Swelling Eyes Complaints and Symptoms: Positive for: Glasses / Contacts Medical  History: Positive for: Cataracts - surgery Integumentary (Skin) Complaints and Symptoms: Positive for: Wounds Constitutional Symptoms (General Health) Complaints and Symptoms: No Complaints or Symptoms Ear/Nose/Mouth/Throat Complaints and Symptoms: No Complaints or Symptoms Hematologic/Lymphatic MARGRETTE, WYNIA ([id number]) Complaints and Symptoms: No Complaints or Symptoms Respiratory Complaints and Symptoms: No Complaints or Symptoms Cardiovascular Medical History: Positive for: Hypertension Gastrointestinal Complaints and Symptoms: No Complaints or Symptoms Endocrine Medical History: Positive for: Type II Diabetes Time with diabetes: 10 yrs Treated with: Insulin, Oral agents Blood sugar tested every day: Yes Tested : Genitourinary Complaints and Symptoms: No Complaints or Symptoms Immunological Complaints and Symptoms: No Complaints or Symptoms Musculoskeletal Medical History: Positive for: Osteoarthritis Neurologic Complaints and Symptoms: No Complaints or Symptoms Oncologic Complaints and Symptoms: No Complaints or Symptoms SHAKILA, MAK ([id number]) Psychiatric Complaints and Symptoms: No Complaints or Symptoms HBO Extended History Items Eyes: Cataracts Immunizations Pneumococcal Vaccine: Received Pneumococcal Vaccination: Yes Family and Social History Cancer: No; Diabetes: Yes - Mother, Siblings; Heart Disease: No; Hereditary Spherocytosis: No; Hypertension: Yes - Father; Kidney Disease: No; Lung Disease: Yes - Father; Seizures: No; Stroke: Yes - Mother; Thyroid Problems: No; Tuberculosis: No; Former smoker - back when she was 4-19; Marital Status -  Divorced; Alcohol Use: Rarely; Drug Use: No History; Caffeine Use: Daily; Financial Concerns: No; Food, Clothing or Shelter Needs: No; Support System Lacking: No; Transportation Concerns: No; Advanced Directives: No; Patient does not want information on Advanced Directives; Do not resuscitate: No;  Living Will: No; Medical Power of Attorney: No Electronic Signature(s) Signed: 04/14/2017 3:57:41 PM By: Linton Ham MD Signed: 04/15/2017 4:54:17 PM By: Alric Quan Entered By: Alric Quan on 04/14/2017 10:44:36 [name] ([id number]) -------------------------------------------------------------------------------- [place] Details Patient Name: KATERI, BALCH. Date of Service: 04/14/2017 Medical Record Patient Account Number: [id number] Number: Treating RN: Ahmed Prima 02-18-1951 (66 y.o. 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 L02.415 Cutaneous abscess of right lower limb Facility Procedures CPT4 Code: [id number] Description: 99214 - WOUND CARE VISIT-LEV 4 EST PT Modifier: Quantity: 1 Physician Procedures CPT4: Description Modifier Quantity Code [id number] WC PHYS LEVEL 3 o NEW PT 1 ICD-10 Description Diagnosis T81.31XD Disruption of external operation (surgical) wound, not elsewhere classified, subsequent encounter L97.111 Non-pressure chronic ulcer of  right thigh limited to breakdown of skin Electronic Signature(s) Signed: 04/14/2017 3:57:41 PM By: Linton Ham MD Signed: 04/15/2017 4:54:17 PM By: Alric Quan Entered By: Alric Quan on 04/14/2017 12:34:30

## 2017-04-16 NOTE — Progress Notes (Signed)
AGNES, BRIGHTBILL ([id number]) Visit Report for 04/14/2017 Abuse/Suicide Risk Screen Details Patient Name: Regina Mckay, Regina Mckay. Date of Service: 04/14/2017 10:30 AM Medical Record Patient Account Number: [id number] Number: Treating RN: Ahmed Prima Jan 14, 1951 (66 y.o. Other Clinician: Date of Birth/Sex: Female) Treating ROBSON, MICHAEL Primary Care Amberlee Garvey: Jani Gravel Connelly [place]/Extender: G Referring Shirley Bolle: Carney Bern in Treatment: 0 Abuse/Suicide Risk Screen Items Answer ABUSE/SUICIDE RISK SCREEN: Has anyone close to you tried to hurt or harm you recentlyo No Do you feel uncomfortable with anyone in your familyo No Has anyone forced you do things that you didnot want to doo No Do you have any thoughts of harming yourselfo No Patient displays signs or symptoms of abuse and/or neglect. No Electronic Signature(s) Signed: 04/15/2017 4:54:17 PM By: Alric Quan Entered By: Alric Quan on 04/14/2017 10:44:43 Regina Mckay ([id number]) -------------------------------------------------------------------------------- Activities of Daily Living Details Patient Name: Regina Mckay. Date of Service: 04/14/2017 10:30 AM Medical Record Patient Account Number: [id number] Number: Treating RN: Ahmed Prima 1951-07-01 (66 y.o. Other Clinician: Date of Birth/Sex: Female) Treating ROBSON, MICHAEL Primary Care Tristina Sahagian: Jani Gravel Lizann Edelman/Extender: G Referring Kimiyo Carmicheal: Carney Bern in Treatment: 0 Activities of Daily Living Items Answer Activities of Daily Living (Please select one for each item) Drive Automobile Completely Able Take Medications Completely Able Use Telephone Completely Able Care for Appearance Completely Able Use Toilet Completely Able Bath / Shower Completely Able Dress Self Completely Able Feed Self Completely Able Walk Completely Able Get In / Out Bed Completely Able Housework Completely Able Prepare Meals  Completely Able Handle Money Completely Able Shop for Self Completely Able Electronic Signature(s) Signed: 04/15/2017 4:54:17 PM By: Alric Quan Entered By: Alric Quan on 04/14/2017 10:45:02 Regina Mckay ([id number]) -------------------------------------------------------------------------------- Education Assessment Details Patient Name: Regina Mckay. Date of Service: 04/14/2017 10:30 AM Medical Record Patient Account Number: [id number] Number: Treating RN: Ahmed Prima 1950-08-31 (66 y.o. Other Clinician: Date of Birth/Sex: Female) Treating ROBSON, [place] Primary Care Gustavo Meditz: Jani Gravel Aline Wesche/Extender: G Referring Tashina Credit: Carney Bern in Treatment: 0 Primary Learner Assessed: Patient Learning Preferences/Education Level/Primary Language Learning Preference: Explanation, Printed Material Highest Education Level: College or Above Cognitive Barrier Assessment/Beliefs Language Barrier: No Translator Needed: No Memory Deficit: No Emotional Barrier: No Cultural/Religious Beliefs Affecting Medical No Care: Physical Barrier Assessment Impaired Vision: Yes Glasses Impaired Hearing: No Decreased Hand dexterity: No Knowledge/Comprehension Assessment Knowledge Level: Medium Comprehension Level: Medium Ability to understand written Medium instructions: Ability to understand verbal Medium instructions: Motivation Assessment Anxiety Level: Calm Cooperation: Cooperative Education Importance: Acknowledges Need Interest in Health Problems: Asks Questions Perception: Coherent Willingness to Engage in Self- Medium Management Activities: Readiness to Engage in Self- Medium Management Activities: Regina Mckay ([id number]) Electronic Signature(s) Signed: 04/15/2017 4:54:17 PM By: Alric Quan Entered By: Alric Quan on 04/14/2017 10:45:26 RYNA, BECKSTROM  ([id number]) -------------------------------------------------------------------------------- Fall Risk Assessment Details Patient Name: Regina Mckay. Date of Service: 04/14/2017 10:30 AM Medical Record Patient Account Number: [id number] Number: Treating RN: Ahmed Prima March 22, 1951 (66 y.o. Other Clinician: Date of Birth/Sex: Female) Treating ROBSON, MICHAEL Primary Care Jiali Linney: Jani Gravel Jhane Lorio/Extender: G Referring Beula Joyner: Carney Bern in Treatment: 0 Fall Risk Assessment Items Have you had 2 or more falls in the last 12 monthso 0 No Have you had any fall that resulted in injury in the last 12 monthso 0 No FALL RISK ASSESSMENT: History of falling - immediate or within 3 months 0 No Secondary diagnosis 0 No Ambulatory aid None/bed rest/wheelchair/nurse 0 No Crutches/cane/walker  0 No Furniture 0 No IV Access/Saline Lock 0 No Gait/Training Normal/bed rest/immobile 0 No Weak 0 No Impaired 0 No Mental Status Oriented to own ability 0 Yes Electronic Signature(s) Signed: 04/15/2017 4:54:17 PM By: Alric Quan Entered By: Alric Quan on 04/14/2017 10:45:37 Regina Mckay ([id number]) -------------------------------------------------------------------------------- Foot Assessment Details Patient Name: Regina Mckay. Date of Service: 04/14/2017 10:30 AM Medical Record Patient Account Number: [id number] Number: Treating RN: Ahmed Prima 11-19-1950 (66 y.o. Other Clinician: Date of Birth/Sex: Female) Treating ROBSON, MICHAEL Primary Care Faigy Stretch: Jani Gravel Koi Yarbro/Extender: G Referring Cadin Luka: Carney Bern in Treatment: 0 Foot Assessment Items Site Locations + = Sensation present, - = Sensation absent, C = Callus, U = Ulcer R = Redness, W = Warmth, M = Maceration, PU = Pre-ulcerative lesion F = Fissure, S = Swelling, D = Dryness Assessment Right: Left: Other Deformity: No No Prior Foot Ulcer: No  No Prior Amputation: No No Charcot Joint: No No Ambulatory Status: Gait: Electronic Signature(s) Signed: 04/15/2017 4:54:17 PM By: Alric Quan Entered By: Alric Quan on 04/14/2017 10:46:18 Regina Mckay ([id number]) -------------------------------------------------------------------------------- Nutrition Risk Assessment Details Patient Name: Regina Mckay. Date of Service: 04/14/2017 10:30 AM Medical Record Patient Account Number: [id number] Number: Treating RN: Ahmed Prima [birth year] (66 y.o. Other Clinician: Date of Birth/Sex: Female) Treating ROBSON, MICHAEL Primary Care Amaar Oshita: Jani Gravel Sakari Raisanen/Extender: G Referring Shalen Petrak: Carney Bern in Treatment: 0 Height (in): 63 Weight (lbs): 255.1 Body Mass Index (BMI): 45.2 Nutrition Risk Assessment Items NUTRITION RISK SCREEN: I have an illness or condition that made me change the kind and/or 2 Yes amount of food I eat I eat fewer than two meals per day 0 No I eat few fruits and vegetables, or milk products 0 No I have three or more drinks of beer, liquor or wine almost every day 0 No I have tooth or mouth problems that make it hard for me to eat 0 No I don't always have enough money to buy the food I need 0 No I eat alone most of the time 0 No I take three or more different prescribed or over-the-counter drugs a 1 Yes day Without wanting to, I have lost or gained 10 pounds in the last six 0 No months I am not always physically able to shop, cook and/or feed myself 0 No Nutrition Protocols Good Risk Protocol Moderate Risk Protocol Electronic Signature(s) Signed: 04/15/2017 4:54:17 PM By: Alric Quan Entered By: Alric Quan on 04/14/2017 10:46:04

## 2017-04-16 NOTE — Progress Notes (Signed)
MERIDIAN, SCHERGER ([id number]) Visit Report for 04/14/2017 Allergy List Details Patient Name: Regina Mckay, Regina Mckay. Date of Service: 04/14/2017 10:30 AM Medical Record Patient Account Number: [id number] Number: Treating RN: Ahmed Prima [birth year] (66 y.o. Other Clinician: Date of Birth/Sex: Female) Treating ROBSON, MICHAEL Primary Care Kristilyn Coltrane: Jani Gravel Junell Cullifer/Extender: G Referring Khary Mckay: Teola Bradley Weeks in Treatment: 0 Allergies Active Allergies penicillin Allergy Notes Electronic Signature(s) Signed: 04/15/2017 4:54:17 PM By: Alric Quan Entered By: Alric Quan on 04/14/2017 10:38:11 Regina Mckay ([id number]) -------------------------------------------------------------------------------- Arrival Information Details Patient Name: Regina, Mckay. Date of Service: 04/14/2017 10:30 AM Medical Record Patient Account Number: [id number] Number: Treating RN: Ahmed Prima [birth year] (66 y.o. Other Clinician: Date of Birth/Sex: Female) Treating ROBSON, MICHAEL Primary Care Theophilus Walz: Jani Gravel Jahkai Yandell/Extender: G Referring Clytie Shetley: Carney Bern in Treatment: 0 Visit Information Patient Arrived: Ambulatory Arrival Time: 10:31 Accompanied By: self Transfer Assistance: None Patient Identification Verified: Yes Secondary Verification Process Yes Completed: Patient Requires Transmission-Based No Precautions: Patient Has Alerts: Yes Patient Alerts: DM II Electronic Signature(s) Signed: 04/15/2017 4:54:17 PM By: Alric Quan Entered By: Alric Quan on 04/14/2017 10:37:03 Regina Mckay ([id number]) -------------------------------------------------------------------------------- Clinic Level of Care Assessment Details Patient Name: Regina Mckay. Date of Service: 04/14/2017 10:30 AM Medical Record Patient Account Number: [id number] Number: Treating RN: Ahmed Prima [birth year] (66 y.o. Other  Clinician: Date of Birth/Sex: Female) Treating ROBSON, MICHAEL Primary Care Itai Barbian: Jani Gravel Flossie Wexler/Extender: G Referring Elly Haffey: Carney Bern in Treatment: 0 Clinic Level of Care Assessment Items TOOL 2 Quantity Score X - Use when only an EandM is performed on the INITIAL visit 1 0 ASSESSMENTS - Nursing Assessment / Reassessment X - General Physical Exam (combine w/ comprehensive assessment (listed just 1 20 below) when performed on new pt. evals) X - Comprehensive Assessment (HX, ROS, Risk Assessments, Wounds Hx, etc.) 1 25 ASSESSMENTS - Wound and Skin Assessment / Reassessment []  - Simple Wound Assessment / Reassessment - one wound 0 X - Complex Wound Assessment / Reassessment - multiple wounds 2 5 []  - Dermatologic / Skin Assessment (not related to wound area) 0 ASSESSMENTS - Ostomy and/or Continence Assessment and Care []  - Incontinence Assessment and Management 0 []  - Ostomy Care Assessment and Management (repouching, etc.) 0 PROCESS - Coordination of Care X - Simple Patient / Family Education for ongoing care 1 15 []  - Complex (extensive) Patient / Family Education for ongoing care 0 []  - Staff obtains Programmer, systems, Records, Test Results / Process Orders 0 []  - Staff telephones HHA, Nursing Homes / Clarify orders / etc 0 []  - Routine Transfer to another Facility (non-emergent condition) 0 []  - Routine Hospital Admission (non-emergent condition) 0 []  - New Admissions / Biomedical engineer / Ordering NPWT, Apligraf, etc. 0 []  - Emergency Hospital Admission (emergent condition) 0 KHILA, PAPP. ([id number]) X - Simple Discharge Coordination 1 10 []  - Complex (extensive) Discharge Coordination 0 PROCESS - Special Needs []  - Pediatric / Minor Patient Management 0 []  - Isolation Patient Management 0 []  - Hearing / Language / Visual special needs 0 []  - Assessment of Community assistance (transportation, D/C planning, etc.) 0 []  - Additional assistance /  Altered mentation 0 []  - Support Surface(s) Assessment (bed, cushion, seat, etc.) 0 INTERVENTIONS - Wound Cleansing / Measurement X - Wound Imaging (photographs - any number of wounds) 1 5 []  - Wound Tracing (instead of photographs) 0 []  - Simple Wound Measurement - one wound 0 X - Complex Wound Measurement - multiple wounds 2  5 []  - Simple Wound Cleansing - one wound 0 X - Complex Wound Cleansing - multiple wounds 2 5 INTERVENTIONS - Wound Dressings X - Small Wound Dressing one or multiple wounds 2 10 []  - Medium Wound Dressing one or multiple wounds 0 []  - Large Wound Dressing one or multiple wounds 0 []  - Application of Medications - injection 0 INTERVENTIONS - Miscellaneous []  - External ear exam 0 []  - Specimen Collection (cultures, biopsies, blood, body fluids, etc.) 0 []  - Specimen(s) / Culture(s) sent or taken to Lab for analysis 0 []  - Patient Transfer (multiple staff / Harrel Lemon Lift / Similar devices) 0 []  - Simple Staple / Suture removal (25 or less) 0 LAURISSA, COWPER. ([id number]) []  - Complex Staple / Suture removal (26 or more) 0 []  - Hypo / Hyperglycemic Management (close monitor of Blood Glucose) 0 []  - Ankle / Brachial Index (ABI) - do not check if billed separately 0 Has the patient been seen at the hospital within the last three years: Yes Total Score: 125 Level Of Care: New/Established - Level 4 Electronic Signature(s) Signed: 04/15/2017 4:54:17 PM By: Alric Quan Entered By: Alric Quan on 04/14/2017 12:34:20 Regina Mckay ([id number]) -------------------------------------------------------------------------------- Encounter Discharge Information Details Patient Name: Regina, Mckay. Date of Service: 04/14/2017 10:30 AM Medical Record Patient Account Number: [id number] Number: Treating RN: Ahmed Prima Nov 29, 1950 (66 y.o. Other Clinician: Date of Birth/Sex: Female) Treating ROBSON, MICHAEL Primary Care Leetta Hendriks: Jani Gravel Taylyn Brame/Extender: G Referring Tearsa Kowalewski: Carney Bern in Treatment: 0 Encounter Discharge Information Items Discharge Pain Level: 0 Discharge Condition: Stable Ambulatory Status: Ambulatory Discharge Destination: Home Transportation: Private Auto Accompanied By: self Schedule Follow-up Appointment: Yes Medication Reconciliation completed and provided to Patient/Care No Warrick Llera: Provided on Clinical Summary of Care: 04/14/2017 Form Type Recipient Paper Patient JW Electronic Signature(s) Signed: 04/15/2017 9:21:03 AM By: Ruthine Dose Entered By: Ruthine Dose on 04/14/2017 11:33:45 Regina Mckay ([id number]) -------------------------------------------------------------------------------- Lower Extremity Assessment Details Patient Name: TABATHA, RAZZANO. Date of Service: 04/14/2017 10:30 AM Medical Record Patient Account Number: [id number] Number: Treating RN: Ahmed Prima 08/28/1950 (66 y.o. Other Clinician: Date of Birth/Sex: Female) Treating ROBSON, MICHAEL Primary Care Alisha Bacus: Jani Gravel Syniyah Bourne/Extender: G Referring Khyla Mccumbers: Teola Bradley Weeks in Treatment: 0 Electronic Signature(s) Signed: 04/15/2017 4:54:17 PM By: Alric Quan Entered By: Alric Quan on 04/14/2017 10:46:36 Regina Mckay ([id number]) -------------------------------------------------------------------------------- Multi Wound Chart Details Patient Name: TENESHA, GARZA. Date of Service: 04/14/2017 10:30 AM Medical Record Patient Account Number: [id number] Number: Treating RN: Ahmed Prima [birth year] (66 y.o. 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] [N/A:N/A] Secondary Etiology: [1:Abscess] [2:Skin Tear] [N/A:N/A] Comorbid History: [1:Cataracts, Hypertension, Type II Diabetes, Osteoarthritis] [2:Cataracts, Hypertension, Type II Diabetes, Osteoarthritis] [N/A:N/A] Date Acquired: [1:04/07/2017] [2:04/14/2017] [N/A:N/A] Weeks of Treatment: [1:0] [2:0] [N/A:N/A] Wound Status: [1:Open] [2:Open] [N/A:N/A] Measurements L x W x D 0.5x1.5x1.2 [2:0.3x2x0.1] [N/A:N/A] (cm) Area (cm) : [1:0.589] [2:0.471] [N/A:N/A] Volume (cm) : [1:0.707] [2:0.047] [N/A:N/A] Classification: [1:Grade 1] [2:Grade 1] [N/A:N/A] Exudate Amount: [1:Large] [2:Large] [N/A:N/A] Exudate Type: [1:Serosanguineous] [  2:Serosanguineous] [N/A:N/A] Exudate Color: [1:red, brown] [2:red, brown] [N/A:N/A] Wound Margin: [1:Distinct, outline attached] [2:Distinct, outline attached] [N/A:N/A] Granulation Amount: [1:Medium (34-66%)] [2:Large (67-100%)] [N/A:N/A] Granulation Quality: [1:Red] [2:Red] [N/A:N/A] Necrotic Amount: [1:Medium (34-66%)] [2:None Present (0%)] [N/A:N/A] Epithelialization: [1:None] [2:N/A] [N/A:N/A] Periwound Skin Texture: No Abnormalities Noted [1:No Abnormalities Noted] [2:No Abnormalities Noted No Abnormalities Noted] [N/A:N/A N/A] Periwound Skin Moisture: Periwound Skin Color: No Abnormalities Noted No Abnormalities Noted N/A Temperature: No Abnormality No Abnormality N/A Tenderness on Yes Yes N/A Palpation: Wound Preparation: Ulcer Cleansing: Ulcer Cleansing: N/A Rinsed/Irrigated with Rinsed/Irrigated with Saline Saline Topical Anesthetic Topical Anesthetic Applied: Other:  lidocaine Applied: Other: lidocaine 4% 4% Treatment Notes Wound #1 (Right, Proximal, Medial Upper Leg) 1. Cleansed with: Clean wound with Normal Saline 2. Anesthetic Topical Lidocaine 4% cream to wound bed prior to debridement 3. Peri-wound Care: Skin Prep 4. Dressing Applied: Other dressing (specify in notes) 5. Secondary Dressing Applied [place] Notes SilverCel rope Wound #2 (Right, Distal, Medial Upper Leg) 1. Cleansed with: Clean wound with Normal Saline 2. Anesthetic Topical Lidocaine 4% cream to wound bed prior to debridement 4. Dressing Applied: Other dressing (specify in notes) 5. Secondary Dressing Applied Dry Gauze [place] Notes SilverCel Electronic Signature(s) Signed: 04/14/2017 3:57:41 PM By: Linton Ham MD Regina Mckay ([id number]) Entered By: Linton Ham on 04/14/2017 12:19:26 CRYSTALYN, DELIA ([id number]) -------------------------------------------------------------------------------- [place] Details Patient Name: ALAISA, MOFFITT. Date of Service: 04/14/2017 10:30 AM Medical Record Patient Account Number: [id number] Number: Treating RN: Ahmed Prima October 10, 1950 (66 y.o. Other Clinician: Date of Birth/Sex: Female) Treating ROBSON, MICHAEL Primary Care Allien Melberg: Jani Gravel Ayoub Arey/Extender: G Referring Candid Bovey: Carney Bern in Treatment: 0 Active Inactive ` Orientation to the Wound Care Program Nursing Diagnoses: Knowledge deficit related to the wound healing center program Goals: Patient/caregiver will verbalize understanding of the [place] Program Date Initiated: 04/14/2017 Target Resolution Date: 05/09/2017 Goal Status: Active Interventions: Provide education on orientation to the wound center Notes: ` Pain, Acute or Chronic Nursing Diagnoses: Pain, acute or chronic: actual or potential Potential alteration in comfort, pain Goals: Patient/caregiver will  verbalize adequate pain control between visits Date Initiated: 04/14/2017 Target Resolution Date: 08/08/2017 Goal Status: Active Interventions: Complete pain assessment as per visit requirements Notes: ` Wound/Skin Impairment KAEGAN, STIGLER ([id number]) Nursing Diagnoses: Impaired tissue integrity Knowledge deficit related to ulceration/compromised skin integrity Goals: Ulcer/skin breakdown will have a volume reduction of 80% by week 12 Date Initiated: 04/14/2017 Target Resolution Date: 07/04/2017 Goal Status: Active Interventions: Assess patient/caregiver ability to perform ulcer/skin care regimen upon admission and as needed Notes: Electronic Signature(s) Signed: 04/15/2017 4:54:17 PM By: Alric Quan Entered By: Alric Quan on 04/14/2017 11:09:39 Regina Mckay ([id number]) -------------------------------------------------------------------------------- Pain Assessment Details Patient Name: Regina Mckay. Date of Service: 04/14/2017 10:30 AM Medical Record Patient Account Number: [id number] Number: Treating RN: Ahmed Prima Sep 03, 1950 (66 y.o. Other Clinician: Date of Birth/Sex: Female) Treating ROBSON, MICHAEL Primary Care Atticus Wedin: Jani Gravel Alexiah Koroma/Extender: G Referring Vignesh Willert: Carney Bern in Treatment: 0 Active Problems Location of Pain Severity and Description of Pain Patient Has Paino No Site Locations Pain Management and Medication Current Pain Management: Electronic Signature(s) Signed: 04/15/2017 4:54:17 PM By: Alric Quan Entered By: Alric Quan on 04/14/2017 10:37:09 Regina Mckay ([id number]) -------------------------------------------------------------------------------- Patient/Caregiver Education Details Patient Name: LIZBETT, GARCIAGARCIA. Date of Service: 04/14/2017 10:30 AM Medical Record Patient Account Number: [id number] Number: Treating RN: Ahmed Prima [birth year] (66 y.o. Other  Clinician: Date of Birth/Gender: Female) Treating ROBSON,  MICHAEL Primary Care Physician: Jani Gravel Physician/Extender: G Referring Physician: Carney Bern in Treatment: 0 Education Assessment Education Provided To: Patient Education Topics Provided Nutrition: Handouts: Nutrition Methods: Explain/Verbal Responses: State content correctly Welcome To [facility] [place]: Handouts: Welcome To [facility] [place] Methods: Explain/Verbal Responses: State content correctly Wound/Skin Impairment: Handouts: Other: change dressing as ordered Methods: Demonstration, Explain/Verbal Responses: State content correctly Electronic Signature(s) Signed: 04/15/2017 4:54:17 PM By: Alric Quan Entered By: Alric Quan on 04/14/2017 11:18:10 Regina Mckay ([id number]) -------------------------------------------------------------------------------- Wound Assessment Details Patient Name: JADAMARIE, BUTSON. Date of Service: 04/14/2017 10:30 AM Medical Record Patient Account Number: [id number] Number: Treating RN: Ahmed Prima 22-Mar-1951 (66 y.o. Other Clinician: Date of Birth/Sex: Female) Treating ROBSON, [place] Primary Care Iara Monds: Jani Gravel Kadan Millstein/Extender: G Referring Josejuan Hoaglin: Carney Bern in Treatment: 0 Wound Status Wound Number: 1 Primary Diabetic Wound/Ulcer of the Lower Etiology: Extremity Wound Location: Right Upper Leg - Medial, Proximal Secondary Abscess Etiology: Wounding Event: Gradually Appeared Wound Status: Open Date Acquired: 04/07/2017 Comorbid Cataracts, Hypertension, Type II Weeks Of Treatment: 0 History: Diabetes, Osteoarthritis Clustered Wound: No Photos Photo Uploaded By: Gretta Cool, BSN, RN, CWS, Kim on 04/14/2017 16:00:00 Wound Measurements Length: (cm) 0.5 Width: (cm) 1.5 Depth: (cm) 1.2 Area: (cm) 0.589 Volume: (cm) 0.707 % Reduction in Area: % Reduction in Volume: Epithelialization: None Tunneling:  No Undermining: No Wound Description Classification: Grade 1 Foul Odor Aft Wound Margin: Distinct, outline attached Slough/Fibrin Exudate Amount: Large Exudate Type: Serosanguineous Exudate Color: red, brown er Cleansing: No o Yes Wound Bed Granulation Amount: Medium (34-66%) Granulation Quality: Red TERETHA, CHALUPA ([id number]) Necrotic Amount: Medium (34-66%) Necrotic Quality: Adherent Slough Periwound Skin Texture Texture Color No Abnormalities Noted: No No Abnormalities Noted: No Moisture Temperature / Pain No Abnormalities Noted: No Temperature: No Abnormality Tenderness on Palpation: Yes Wound Preparation Ulcer Cleansing: Rinsed/Irrigated with Saline Topical Anesthetic Applied: Other: lidocaine 4%, Treatment Notes Wound #1 (Right, Proximal, Medial Upper Leg) 1. Cleansed with: Clean wound with Normal Saline 2. Anesthetic Topical Lidocaine 4% cream to wound bed prior to debridement 3. Peri-wound Care: Skin Prep 4. Dressing Applied: Other dressing (specify in notes) 5. Secondary Dressing Applied Dry Gauze [place] Notes SilverCel rope Electronic Signature(s) Signed: 04/15/2017 4:54:17 PM By: Alric Quan Entered By: Alric Quan on 04/14/2017 10:56:17 TAKIERA, MAYO ([id number]) -------------------------------------------------------------------------------- Wound Assessment Details Patient Name: BREYANA, FOLLANSBEE. Date of Service: 04/14/2017 10:30 AM Medical Record Patient Account Number: [id number] Number: Treating RN: Ahmed Prima [birth year] (66 y.o. Other Clinician: Date of Birth/Sex: Female) Treating ROBSON, [place] Primary Care Creola Krotz: Jani Gravel Tyrique Sporn/Extender: G Referring Timothy Trudell: Carney Bern in Treatment: 0 Wound Status Wound Number: 2 Primary Diabetic Wound/Ulcer of the Lower Etiology: Extremity Wound Location: Right Upper Leg - Medial, Distal Secondary Skin Tear Etiology: Wounding Event: Gradually  Appeared Wound Status: Open Date Acquired: 04/14/2017 Comorbid Cataracts, Hypertension, Type II Weeks Of Treatment: 0 History: Diabetes, Osteoarthritis Clustered Wound: No Photos Photo Uploaded By: Gretta Cool, BSN, RN, CWS, Kim on 04/14/2017 16:00:01 Wound Measurements Length: (cm) Width: (cm) Depth: (cm) Area: (cm) Volume: (cm) 0.3 % Reduction in Area: 2 % Reduction in Volume: 0.1 0.471 0.047 Wound Description Classification: Grade 1 Foul Odor Aft Wound Margin: Distinct, outline attached Slough/Fibrin Exudate Amount: Large Exudate Type: Serosanguineous Exudate Color: red, brown er Cleansing: No o No Wound Bed Granulation Amount: Large (67-100%) Granulation Quality: DESHONNA, TRNKA. ([id number]) Necrotic Amount: None Present (0%) Periwound Skin Texture Texture Color No Abnormalities Noted: No No Abnormalities Noted:  No Moisture Temperature / Pain No Abnormalities Noted: No Temperature: No Abnormality Tenderness on Palpation: Yes Wound Preparation Ulcer Cleansing: Rinsed/Irrigated with Saline Topical Anesthetic Applied: Other: lidocaine 4%, Treatment Notes Wound #2 (Right, Distal, Medial Upper Leg) 1. Cleansed with: Clean wound with Normal Saline 2. Anesthetic Topical Lidocaine 4% cream to wound bed prior to debridement 4. Dressing Applied: Other dressing (specify in notes) 5. Secondary Dressing Applied Dry [place] Notes SilverCel Electronic Signature(s) Signed: 04/15/2017 4:54:17 PM By: Alric Quan Entered By: Alric Quan on 04/14/2017 10:58:24 Regina Mckay ([id number]) -------------------------------------------------------------------------------- [place] Details Patient Name: TIMARA, LOMA. Date of Service: 04/14/2017 10:30 AM Medical Record Patient Account Number: [id number] Number: Treating RN: Ahmed Prima 05-09-1951 (66 y.o. Other Clinician: Date of Birth/Sex: Female) Treating ROBSON, MICHAEL Primary Care  Horatio Bertz: Jani Gravel Salley Boxley/Extender: G Referring Keyden Pavlov: Carney Bern in Treatment: 0 Vital Signs Time Taken: 10:37 Temperature (F): 98.5 Height (in): 63 Pulse (bpm): 76 Source: Stated Respiratory Rate (breaths/min): 18 Weight (lbs): 255.1 Blood Pressure (mmHg): 158/67 Source: Measured Reference Range: 80 - 120 mg / dl Body Mass Index (BMI): 45.2 Electronic Signature(s) Signed: 04/15/2017 4:54:17 PM By: Alric Quan Entered By: Alric Quan on 04/14/2017 10:37:38

## 2017-04-18 LAB — AEROBIC CULTURE W GRAM STAIN (SUPERFICIAL SPECIMEN)

## 2017-04-21 ENCOUNTER — Encounter: Payer: Federal, State, Local not specified - PPO | Admitting: Internal Medicine

## 2017-04-21 DIAGNOSIS — L97111 Non-pressure chronic ulcer of right thigh limited to breakdown of skin: Secondary | ICD-10-CM | POA: Diagnosis not present

## 2017-04-22 NOTE — Progress Notes (Addendum)
JOYIA, RIEHLE ([id number]) Visit Report for 04/21/2017 HPI Details Patient Name: Regina Mckay, Regina Mckay. Date of Service: 04/21/2017 3:00 PM Medical Record Patient Account Number: [id number] Number: Treating RN: Cornell Barman 11/17/1950 (66 y.o. Other Clinician: Date of Birth/Sex: Female) Treating ,  Primary Care Provider: Jani Gravel Provider/Extender: G Referring Provider: Liborio Nixon in Treatment: 1 History of Present Illness HPI Description: 04/14/17; this is a 66 year old woman who is a type II diabetic on insulin. She presented to an urgent care on [address] in [place] on 9/4 with a 4 day history of a painful area on the right upper thigh. This apparently was already draining and underwent an IandD with 2 mls of "frank pus" obtained. The wound was packed with dry gauze and a BD pads and Xeroform. She was prescribed doxycycline for 10 days which she is still taking. She is here for our review of the surgical IandD site. She apparently had a similar area in April and saw the same doctor in the same location. There is no reference to a culture result from that time frame. Culture results from this occasion showed negative anaerobic culture and negative aerobic culture. She is still taken the doxycycline. The patient does not give a history of recurrent skin abscesses other than the problem in April of this year. No noted areas in her axilla inframammary or groin area. She is a type II diabetic apparently with a last hemoglobin A1c of 8. There was very little to see and [place] link on this patient. She had cellulitis in the left leg in 2017 and a vascular ultrasound that was negative for DVT in 2017. She is listed as having chronic hep C 04/21/17; culture I did last week was negative. She's been using silver alginate packing that her friend is helping her with. Depth is down slightly Electronic Signature(s) Signed: 04/21/2017 4:13:20 PM By: Linton Ham MD Entered By: Linton Ham on 04/21/2017 15:26:45 [name] ([id number]) -------------------------------------------------------------------------------- Physical Exam Details Patient Name: Regina Mckay, Regina Mckay. Date of Service: 04/21/2017 3:00 PM Medical Record Patient Account Number: [id number] Number: Treating RN: Cornell Barman [birth year] (66 y.o. Other Clinician: Date of Birth/Sex: Female) Treating ,  Primary Care Provider: Jani Gravel Provider/Extender: G Referring Provider: Liborio Nixon in Treatment: 1 Constitutional Patient is hypertensive.. Pulse regular and within target range for patient.Marland Kitchen Respirations regular, non-labored and within target range.. Temperature is normal and within the target range for the patient.Marland Kitchen appears in no distress. Eyes Conjunctivae clear. No discharge. Ears, Nose, Mouth, and Throat Auditory canals and tympanic membranes with normal landmarks. No effusions or erythema.Marland Kitchen Respiratory Respiratory effort is easy and symmetric bilaterally. Rate is normal at rest and on room air.Marland Kitchen Lymphatic None palpable in the inguinal area. Psychiatric No evidence of depression, anxiety, or agitation. Calm, cooperative, and communicative. Appropriate interactions and affect.. Notes Wound exam; small IandD site on the wound. There is surrounding firmness especially medially but absolutely no tenderness no drainage no pain to palpation. There is no surrounding lymphadenopathy. She apparently has now recurrent abscesses in this area although I can see no good reason for this Electronic Signature(s) Signed: 04/21/2017 4:13:20 PM By: Linton Ham MD Entered By: Linton Ham on 04/21/2017 15:29:39 [name] ([id number]) -------------------------------------------------------------------------------- Physician Orders Details Patient Name: Regina Mckay, Regina Mckay. Date of Service: 04/21/2017 3:00 PM Medical Record Patient Account  Number: [id number] Number: Treating RN: Cornell Barman January 19, 1951 (66 y.o. 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 Anesthetic Wound #1 Right,Proximal,Medial Upper Leg o Topical Lidocaine 4% cream applied to wound bed prior to debridement Skin Barriers/Peri-Wound Care Wound #1 Right,Proximal,Medial Upper Leg o Skin Prep Primary Wound Dressing Wound #1 Right,Proximal,Medial Upper Leg o Other: - SilverCel Rope Secondary Dressing Wound #1 Right,Proximal,Medial Upper Leg o Dry Gauze o Non-adherent pad - telfa island Dressing Change Frequency Wound #1 Right,Proximal,Medial Upper Leg o Change dressing every other day. Follow-up Appointments Regina Mckay, Regina Mckay. ([id number]) Wound #1 Right,Proximal,Medial Upper Leg o Return Appointment in 1 week. Additional Orders / Instructions Wound #1 Right,Proximal,Medial Upper Leg o Increase protein intake. Medications-please add to medication list. Wound #1 Right,Proximal,Medial Upper Leg o Other: - Vitamin C, Zinc, Multivitamin Electronic Signature(s) Signed: 04/21/2017 5:04:14 PM By: Gretta Cool, BSN, RN, CWS, Kim RN, BSN Signed: 04/22/2017 5:33:43 PM By: Linton Ham MD Entered By: Gretta Cool, BSN, RN, CWS, Kim on 04/21/2017 17:02:16 Regina Mckay, Regina Mckay ([id number]) -------------------------------------------------------------------------------- Problem List Details Patient Name: Regina Mckay, Regina Mckay. Date of Service: 04/21/2017 3:00 PM Medical Record Patient Account Number: [id number] Number: Treating RN: Cornell Barman 1951-06-16 (66 y.o. 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 Date of Service: 04/21/2017 3:00 PM Medical Record Patient Account Number: [id number] Number: Treating RN: Cornell Barman [birth year] (66 y.o. Other Clinician: Date of Birth/Sex: Female) Treating ,  Primary Care Provider: Jani Gravel Provider/Extender: G Referring Provider: Liborio Nixon in Treatment: 1 Subjective History of Present Illness (HPI) 04/14/17; this is a 66 year old woman who is a type II diabetic on insulin. She presented to an urgent care on [address] in [place] on 9/4 with a 4 day history of a painful area on the right upper thigh. This apparently was already draining and underwent an IandD with 2 mls of "frank pus" obtained. The wound was packed with dry gauze and a BD pads and Xeroform. She was prescribed doxycycline for 10 days which she is still taking. She is here for our review of the surgical IandD site. She apparently had a similar area in  April and saw the same doctor in the same location. There is no reference to a culture result from that time frame. Culture results from this occasion showed negative anaerobic culture and negative aerobic culture. She is still taken the doxycycline. The patient does not give a history of recurrent skin abscesses other than the problem in April of this year. No noted areas in her axilla inframammary or groin area. She is a type II diabetic apparently with a last hemoglobin A1c of 8. There was very  little to see and [place] link on this patient. She had cellulitis in the left leg in 2017 and a vascular ultrasound that was negative for DVT in 2017. She is listed as having chronic hep C 04/21/17; culture I did last week was negative. She's been using silver alginate packing that her friend is helping her with. Depth is down slightly Objective Constitutional Patient is hypertensive.. Pulse regular and within target range for patient.Marland Kitchen Respirations regular, non-labored and within target range.. Temperature is normal and within the target range for the patient.Marland Kitchen appears in no distress. Vitals Time Taken: 3:03 PM, Height: 63 in, Weight: 255.1 lbs, BMI: 45.2, Temperature: 98.5 F, Pulse: 72 bpm, Respiratory Rate: 16 breaths/min, Blood Pressure: 176/78 mmHg. Regina Mckay, Regina Mckay ([id number]) Eyes Conjunctivae clear. No discharge. Ears, Nose, Mouth, and Throat Auditory canals and tympanic membranes with normal landmarks. No effusions or erythema.Marland Kitchen Respiratory Respiratory effort is easy and symmetric bilaterally. Rate is normal at rest and on room air.Marland Kitchen Lymphatic None palpable in the inguinal area. Psychiatric No evidence of depression, anxiety, or agitation. Calm, cooperative, and communicative. Appropriate interactions and affect.. General Notes: Wound exam; small IandD site on the wound. There is surrounding firmness especially medially but absolutely no tenderness no drainage no pain to  palpation. There is no surrounding lymphadenopathy. She apparently has now recurrent abscesses in this area although I can see no good reason for this Integumentary (Hair, Skin) Wound #1 status is Open. Original cause of wound was Gradually Appeared. The wound is located on the Right,Proximal,Medial Upper Leg. The wound measures 0.3cm length x 0.5cm width x 1.2cm depth; 0.118cm^2 area and 0.141cm^3 volume. There is no tunneling or undermining noted. There is a large amount of serosanguineous drainage noted. The wound margin is distinct with the outline attached to the wound base. There is no granulation within the wound bed. There is no necrotic tissue within the wound bed. Periwound temperature was noted as No Abnormality. The periwound has tenderness on palpation. General Notes: Unable to visualize wound bed due to size and location of wound opening. Wound #2 status is Healed - Epithelialized. Original cause of wound was Gradually Appeared. The wound is located on the Right,Distal,Medial Upper Leg. The wound measures 0cm length x 0cm width x 0cm depth; 0cm^2 area and 0cm^3 volume. There is a large amount of serosanguineous drainage noted. The wound margin is distinct with the outline attached to the wound base. There is large (67-100%) red granulation within the wound bed. There is no necrotic tissue within the wound bed. Periwound temperature was noted as No Abnormality. The periwound has tenderness on palpation. 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 ([id number]) Plan #1 we will continue with silver alginate packing #2 I reviewed the history here and available information she had 2 cc of purulent material aspirated but this did not culture.  This could be a recurrent sebaceous cyst if there is another reason for this I don't really see it at this point Electronic Signature(s) Signed: 04/21/2017 4:13:20 PM By: Linton Ham MD Entered By: Linton Ham on 04/21/2017 15:30:35 [name] ([id number]) -------------------------------------------------------------------------------- [place] Details Patient Name: Regina Mckay, Regina Mckay. Date of Service: 04/21/2017 Medical Record Patient Account Number: [id number] Number: Treating RN: Cornell Barman 12-Mar-1951 (66 y.o. 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 L02.415 Cutaneous abscess of right lower limb Facility Procedures CPT4 Code: [id number] Description: 14431 - WOUND CARE VISIT-LEV 2 EST PT Modifier: Quantity: 1 Physician Procedures CPT4: Description Modifier Quantity Code [id number] - WC PHYS LEVEL 3 - EST PT 1 ICD-10 Description Diagnosis T81.31XD Disruption of external operation (surgical) wound, not elsewhere classified, subsequent encounter L97.111 Non-pressure chronic ulcer  of right thigh limited to breakdown of skin Electronic Signature(s) Signed: 04/21/2017 5:04:14 PM By: Gretta Cool, BSN, RN, CWS, Kim RN, BSN Signed: 04/22/2017 5:33:43 PM By: Linton Ham MD Previous Signature: 04/21/2017 4:13:20 PM Version By: Linton Ham MD Entered By: Gretta Cool, BSN, RN, CWS, Kim on 04/21/2017 17:03:26

## 2017-04-22 NOTE — Progress Notes (Signed)
EARA, BURRUEL ([id number]) Visit Report for 04/21/2017 Arrival Information Details Patient Name: Regina Mckay, Regina Mckay. Date of Service: 04/21/2017 3:00 PM Medical Record Patient Account Number: [id number] Number: Treating RN: Cornell Barman 1951/01/10 (66 y.o. Other Clinician: Date of Birth/Sex: Female) Treating ROBSON, MICHAEL Primary Care Madelina Sanda: Jani Gravel Lucien Budney/Extender: G Referring Ramata Strothman: Liborio Nixon in Treatment: 1 Visit Information History Since Last Visit Added or deleted any medications: No Patient Arrived: Ambulatory Any new allergies or adverse reactions: No Arrival Time: 15:02 Had a fall or experienced change in No Accompanied By: self activities of daily living that may affect Transfer Assistance: None risk of falls: Patient Identification Verified: Yes Signs or symptoms of abuse/neglect since last No Secondary Verification Process Yes visito Completed: Hospitalized since last visit: No Patient Requires Transmission-Based No Has Dressing in Place as Prescribed: Yes Precautions: Pain Present Now: No Patient Has Alerts: Yes Patient Alerts: DM II Electronic Signature(s) Signed: 04/21/2017 4:59:35 PM By: Gretta Cool, BSN, RN, CWS, Kim RN, BSN Entered By: Gretta Cool, BSN, RN, CWS, Kim on 04/21/2017 15:03:20 [name] ([id number]) -------------------------------------------------------------------------------- Clinic Level of Care Assessment Details Patient Name: Regina Mckay, Regina Mckay. Date of Service: 04/21/2017 3:00 PM Medical Record Patient Account Number: [id number] Number: Treating RN: Cornell Barman 1951/07/09 (66 y.o. Other Clinician: Date of Birth/Sex: Female) Treating ROBSON, [place] Primary Care Analysia Dungee: Jani Gravel Laquandra Carrillo/Extender: G Referring Dannah Ryles: Liborio Nixon in Treatment: 1 Clinic Level of Care Assessment Items TOOL 4 Quantity Score []  - Use when only an EandM is performed on FOLLOW-UP visit 0 ASSESSMENTS - Nursing Assessment  / Reassessment []  - Reassessment of Co-morbidities (includes updates in patient status) 0 X - Reassessment of Adherence to Treatment Plan 1 5 ASSESSMENTS - Wound and Skin Assessment / Reassessment X - Simple Wound Assessment / Reassessment - one wound 1 5 []  - Complex Wound Assessment / Reassessment - multiple wounds 0 []  - Dermatologic / Skin Assessment (not related to wound area) 0 ASSESSMENTS - Focused Assessment []  - Circumferential Edema Measurements - multi extremities 0 []  - Nutritional Assessment / Counseling / Intervention 0 []  - Lower Extremity Assessment (monofilament, tuning fork, pulses) 0 []  - Peripheral Arterial Disease Assessment (using hand held doppler) 0 ASSESSMENTS - Ostomy and/or Continence Assessment and Care []  - Incontinence Assessment and Management 0 []  - Ostomy Care Assessment and Management (repouching, etc.) 0 PROCESS - Coordination of Care X - Simple Patient / Family Education for ongoing care 1 15 []  - Complex (extensive) Patient / Family Education for ongoing care 0 []  - Staff obtains Programmer, systems, Records, Test Results / Process Orders 0 []  - Staff telephones HHA, Nursing Homes / Clarify orders / etc 0 Regina Mckay, Regina Mckay ([id number]) []  - Routine Transfer to another Facility (non-emergent condition) 0 []  - Routine Hospital Admission (non-emergent condition) 0 []  - New Admissions / Biomedical engineer / Ordering NPWT, Apligraf, etc. 0 []  - Emergency Hospital Admission (emergent condition) 0 X - Simple Discharge Coordination 1 10 []  - Complex (extensive) Discharge Coordination 0 PROCESS - Special Needs []  - Pediatric / Minor Patient Management 0 []  - Isolation Patient Management 0 []  - Hearing / Language / Visual special needs 0 []  - Assessment of Community assistance (transportation, D/C planning, etc.) 0 []  - Additional assistance / Altered mentation 0 []  - Support Surface(s) Assessment (bed, cushion, seat, etc.) 0 INTERVENTIONS - Wound Cleansing /  Measurement X - Simple Wound Cleansing - one wound 1 5 []  - Complex Wound Cleansing - multiple wounds 0 X -  Wound Imaging (photographs - any number of wounds) 1 5 []  - Wound Tracing (instead of photographs) 0 X - Simple Wound Measurement - one wound 1 5 []  - Complex Wound Measurement - multiple wounds 0 INTERVENTIONS - Wound Dressings []  - Small Wound Dressing one or multiple wounds 0 X - Medium Wound Dressing one or multiple wounds 1 15 []  - Large Wound Dressing one or multiple wounds 0 []  - Application of Medications - topical 0 []  - Application of Medications - injection 0 Regina Mckay, Regina Mckay ([id number]) INTERVENTIONS - Miscellaneous []  - External ear exam 0 []  - Specimen Collection (cultures, biopsies, blood, body fluids, etc.) 0 []  - Specimen(s) / Culture(s) sent or taken to Lab for analysis 0 []  - Patient Transfer (multiple staff / Civil Service fast streamer / Similar devices) 0 []  - Simple Staple / Suture removal (25 or less) 0 []  - Complex Staple / Suture removal (26 or more) 0 []  - Hypo / Hyperglycemic Management (close monitor of Blood Glucose) 0 []  - Ankle / Brachial Index (ABI) - do not check if billed separately 0 X - Vital Signs 1 5 Has the patient been seen at the hospital within the last three years: Yes Total Score: 70 Level Of Care: New/Established - Level 2 Electronic Signature(s) Signed: 04/21/2017 5:04:14 PM By: Gretta Cool, BSN, RN, CWS, Kim RN, BSN Entered By: Gretta Cool, BSN, RN, CWS, Kim on 04/21/2017 17:03:14 [name] ([id number]) -------------------------------------------------------------------------------- Lower Extremity Assessment Details Patient Name: Regina Mckay, Regina Mckay. Date of Service: 04/21/2017 3:00 PM Medical Record Patient Account Number: [id number] Number: Treating RN: Cornell Barman February 12, 1951 (66 y.o. Other Clinician: Date of Birth/Sex: Female) Treating ROBSON, MICHAEL Primary Care Victorhugo Preis: Jani Gravel Leocadio Heal/Extender: G Referring Rubby Barbary: Liborio Nixon in Treatment: 1 Electronic Signature(s) Signed: 04/21/2017 5:04:14 PM By: Gretta Cool, BSN, RN, CWS, Kim RN, BSN Entered By: Gretta Cool, BSN, RN, CWS, Kim on 04/21/2017 17:01:38 ALAYSSA, FLINCHUM ([id number]) -------------------------------------------------------------------------------- Multi Wound Chart Details Patient Name: JUNG, INGERSON. Date of Service: 04/21/2017 3:00 PM Medical Record Patient Account Number: [id number] Number: Treating RN: Cornell Barman 1950/10/04 (66 y.o. Other Clinician: Date of Birth/Sex: Female) Treating ROBSON, MICHAEL Primary Care Kamrin Sibley: Jani Gravel Kurtis Anastasia/Extender: G Referring Wrigley Plasencia: Liborio Nixon in Treatment: 1 Vital Signs Height(in): 63 Pulse(bpm): 72 Weight(lbs): 255.1 Blood Pressure 176/78 (mmHg): Body Mass Index(BMI): 45 Temperature(F): 98.5 Respiratory Rate 16 (breaths/min): Photos: [N/A:N/A] Wound Location: Right Upper Leg - Medial, Right Upper Leg - Medial, N/A Proximal Distal Wounding Event: Gradually Appeared Gradually Appeared N/A Primary Etiology: Diabetic Wound/Ulcer of Diabetic Wound/Ulcer of N/A the Lower Extremity the Lower Extremity Secondary Etiology: Abscess Skin Tear N/A Comorbid History: Cataracts, Hypertension, Cataracts, Hypertension, N/A Type II Diabetes, Type II Diabetes, Osteoarthritis Osteoarthritis Date Acquired: 04/07/2017 04/14/2017 N/A Weeks of Treatment: 1 1 N/A Wound Status: Open Healed - Epithelialized N/A Measurements L x W x D 0.3x0.5x1.2 0x0x0 N/A (cm) Area (cm) : 0.118 0 N/A Volume (cm) : 0.141 0 N/A % Reduction in Area: 80.00% 100.00% N/A % Reduction in Volume: 80.10% 100.00% N/A Classification: Grade 1 Grade 1 N/A Exudate Amount: Large Large N/A Exudate Type: Serosanguineous Serosanguineous N/A Exudate Color: red, brown red, brown N/A ADDYSEN, LOUTH ([id number]) Wound Margin: Distinct, outline attached Distinct, outline attached N/A Granulation Amount: None Present (0%)  Large (67-100%) N/A Granulation Quality: N/A Red N/A Necrotic Amount: None Present (0%) None Present (0%) N/A Epithelialization: None N/A N/A Periwound Skin Texture: No Abnormalities Noted No Abnormalities Noted N/A Periwound Skin No Abnormalities Noted No Abnormalities  Noted N/A Moisture: Periwound Skin Color: No Abnormalities Noted No Abnormalities Noted N/A Temperature: No Abnormality No Abnormality N/A Tenderness on Yes Yes N/A Palpation: Wound Preparation: Ulcer Cleansing: Ulcer Cleansing: N/A Rinsed/Irrigated with Rinsed/Irrigated with Saline Saline Topical Anesthetic Topical Anesthetic Applied: Other: lidocaine Applied: Other: lidocaine 4% 4% Assessment Notes: Unable to visualize wound N/A N/A bed due to size and location of wound opening. Treatment Notes Electronic Signature(s) Signed: 04/21/2017 5:04:14 PM By: Gretta Cool, BSN, RN, CWS, Kim RN, BSN Previous Signature: 04/21/2017 4:13:20 PM Version By: Linton Ham MD Entered By: Gretta Cool, BSN, RN, CWS, Kim on 04/21/2017 17:01:56 Regina Mckay, Regina Mckay ([id number]) -------------------------------------------------------------------------------- [place] Details Patient Name: Regina Mckay, Regina Mckay. Date of Service: 04/21/2017 3:00 PM Medical Record Patient Account Number: [id number] Number: Treating RN: Cornell Barman [birth year] (66 y.o. Other Clinician: Date of Birth/Sex: Female) Treating ROBSON, MICHAEL Primary Care Carletha Dawn: Jani Gravel Hennessey Cantrell/Extender: G Referring Kamarah Bilotta: Liborio Nixon in Treatment: 1 Active Inactive ` Orientation to the Wound Care Program Nursing Diagnoses: Knowledge deficit related to the wound healing center program Goals: Patient/caregiver will verbalize understanding of the [place] Program Date Initiated: 04/14/2017 Target Resolution Date: 05/09/2017 Goal Status: Active Interventions: Provide education on orientation to the wound center Notes: ` Pain, Acute or  Chronic Nursing Diagnoses: Pain, acute or chronic: actual or potential Potential alteration in comfort, pain Goals: Patient/caregiver will verbalize adequate pain control between visits Date Initiated: 04/14/2017 Target Resolution Date: 08/08/2017 Goal Status: Active Interventions: Complete pain assessment as per visit requirements Notes: ` Wound/Skin Impairment Regina Mckay, Regina Mckay ([id number]) Nursing Diagnoses: Impaired tissue integrity Knowledge deficit related to ulceration/compromised skin integrity Goals: Ulcer/skin breakdown will have a volume reduction of 80% by week 12 Date Initiated: 04/14/2017 Target Resolution Date: 07/04/2017 Goal Status: Active Interventions: Assess patient/caregiver ability to perform ulcer/skin care regimen upon admission and as needed Notes: Electronic Signature(s) Signed: 04/21/2017 5:04:14 PM By: Gretta Cool, BSN, RN, CWS, Kim RN, BSN Entered By: Gretta Cool, BSN, RN, CWS, Kim on 04/21/2017 17:01:43 Regina Mckay, Regina Mckay ([id number]) -------------------------------------------------------------------------------- Pain Assessment Details Patient Name: Regina Mckay, Regina Mckay. Date of Service: 04/21/2017 3:00 PM Medical Record Patient Account Number: [id number] Number: Treating RN: Cornell Barman 01-Oct-1950 (66 y.o. Other Clinician: Date of Birth/Sex: Female) Treating ROBSON, [place] Primary Care Pricella Gaugh: Jani Gravel Mj Willis/Extender: G Referring Rhonin Trott: Liborio Nixon in Treatment: 1 Active Problems Location of Pain Severity and Description of Pain Patient Has Paino No Site Locations With Dressing Change: No Pain Management and Medication Current Pain Management: Goals for Pain Management Topical or injectable lidocaine is offered to patient for acute pain when surgical debridement is performed. If needed, Patient is instructed to use over the counter pain medication for the following 24-48 hours after debridement. Wound care MDs do not prescribed pain  medications. Patient has chronic pain or uncontrolled pain. Patient has been instructed to make an appointment with their Primary Care Physician for pain management. Electronic Signature(s) Signed: 04/21/2017 4:59:35 PM By: Gretta Cool, BSN, RN, CWS, Kim RN, BSN Entered By: Gretta Cool, BSN, RN, CWS, Kim on 04/21/2017 15:03:31 Regina Mckay, Regina Mckay ([id number]) -------------------------------------------------------------------------------- Wound Assessment Details Patient Name: Regina Mckay, Regina Mckay. Date of Service: 04/21/2017 3:00 PM Medical Record Patient Account Number: [id number] Number: Treating RN: Cornell Barman [birth year] (66 y.o. Other Clinician: Date of Birth/Sex: Female) Treating ROBSON, MICHAEL Primary Care Torra Pala: Jani Gravel Kobi Mario/Extender: G Referring Braeden Dolinski: Liborio Nixon in Treatment: 1 Wound Status Wound Number: 1 Primary Diabetic Wound/Ulcer of the Lower Etiology: Extremity Wound Location: Right Upper Leg - Medial,  Proximal Secondary Abscess Etiology: Wounding Event: Gradually Appeared Wound Status: Open Date Acquired: 04/07/2017 Comorbid Cataracts, Hypertension, Type II Weeks Of Treatment: 1 History: Diabetes, Osteoarthritis Clustered Wound: No Photos Wound Measurements Length: (cm) 0.3 Width: (cm) 0.5 Depth: (cm) 1.2 Area: (cm) 0.118 Volume: (cm) 0.141 % Reduction in Area: 80% % Reduction in Volume: 80.1% Epithelialization: None Tunneling: No Undermining: No Wound Description Classification: Grade 1 Foul Odor Afte Wound Margin: Distinct, outline attached Slough/Fibrino Exudate Amount: Large Exudate Type: Serosanguineous Exudate Color: red, brown r Cleansing: No No Wound Bed Granulation Amount: None Present (0%) Necrotic Amount: None Present (0%) Periwound Skin Texture Texture Color Regina Mckay, SILBA ([id number]) No Abnormalities Noted: No No Abnormalities Noted: No Moisture Temperature / Pain No Abnormalities Noted: No Temperature: No  Abnormality Tenderness on Palpation: Yes Wound Preparation Ulcer Cleansing: Rinsed/Irrigated with Saline Topical Anesthetic Applied: Other: lidocaine 4%, Assessment Notes Unable to visualize wound bed due to size and location of wound opening. Electronic Signature(s) Signed: 04/21/2017 4:59:35 PM By: Gretta Cool, BSN, RN, CWS, Kim RN, BSN Entered By: Gretta Cool, BSN, RN, CWS, Kim on 04/21/2017 15:13:29 KATELAN, HIRT ([id number]) -------------------------------------------------------------------------------- Wound Assessment Details Patient Name: KAYLYNN, CHAMBLIN. Date of Service: 04/21/2017 3:00 PM Medical Record Patient Account Number: [id number] Number: Treating RN: Cornell Barman 09/06/1950 (66 y.o. Other Clinician: Date of Birth/Sex: Female) Treating ROBSON, [place] Primary Care Roniesha Hollingshead: Jani Gravel Charm Stenner/Extender: G Referring Craig Ionescu: Liborio Nixon in Treatment: 1 Wound Status Wound Number: 2 Primary Diabetic Wound/Ulcer of the Lower Etiology: Extremity Wound Location: Right Upper Leg - Medial, Distal Secondary Skin Tear Etiology: Wounding Event: Gradually Appeared Wound Status: Healed - Epithelialized Date Acquired: 04/14/2017 Comorbid Cataracts, Hypertension, Type II Weeks Of Treatment: 1 History: Diabetes, Osteoarthritis Clustered Wound: No Photos Wound Measurements Length: (cm) 0 % Reducti Width: (cm) 0 % Reducti Depth: (cm) 0 Area: (cm) 0 Volume: (cm) 0 on in Area: 100% on in Volume: 100% Wound Description Classification: Grade 1 Wound Margin: Distinct, outline attached Exudate Amount: Large Exudate Type: Serosanguineous Exudate Color: red, brown Foul Odor After Cleansing: No Slough/Fibrino No Wound Bed Granulation Amount: Large (67-100%) Granulation Quality: Red Necrotic Amount: None Present (0%) Periwound Skin Texture TALAYAH, PICARDI ([id number]) Texture Color No Abnormalities Noted: No No Abnormalities Noted: No Moisture Temperature /  Pain No Abnormalities Noted: No Temperature: No Abnormality Tenderness on Palpation: Yes Wound Preparation Ulcer Cleansing: Rinsed/Irrigated with Saline Topical Anesthetic Applied: Other: lidocaine 4%, Electronic Signature(s) Signed: 04/21/2017 4:59:35 PM By: Gretta Cool, BSN, RN, CWS, Kim RN, BSN Entered By: Gretta Cool, BSN, RN, CWS, Kim on 04/21/2017 15:13:51 RAINNA, NEARHOOD ([id number]) -------------------------------------------------------------------------------- Vitals Details Patient Name: DERENDA, GIDDINGS. Date of Service: 04/21/2017 3:00 PM Medical Record Patient Account Number: [id number] Number: Treating RN: Cornell Barman [birth year] (66 y.o. Other Clinician: Date of Birth/Sex: Female) Treating ROBSON, MICHAEL Primary Care Lorana Maffeo: Jani Gravel Izetta Sakamoto/Extender: G Referring Tanza Pellot: Liborio Nixon in Treatment: 1 Vital Signs Time Taken: 15:03 Temperature (F): 98.5 Height (in): 63 Pulse (bpm): 72 Weight (lbs): 255.1 Respiratory Rate (breaths/min): 16 Body Mass Index (BMI): 45.2 Blood Pressure (mmHg): 176/78 Reference Range: 80 - 120 mg / dl Electronic Signature(s) Signed: 04/21/2017 4:59:35 PM By: Gretta Cool, BSN, RN, CWS, Kim RN, BSN Entered By: Gretta Cool, BSN, RN, CWS, Kim on 04/21/2017 15:04:13

## 2017-04-28 ENCOUNTER — Encounter: Payer: Federal, State, Local not specified - PPO | Admitting: Internal Medicine

## 2017-04-28 DIAGNOSIS — L97111 Non-pressure chronic ulcer of right thigh limited to breakdown of skin: Secondary | ICD-10-CM | POA: Diagnosis not present

## 2017-04-29 NOTE — Progress Notes (Signed)
Regina Mckay, Regina Mckay ([id number]) Visit Report for 04/28/2017 Arrival Information Details Patient Name: Regina Mckay, Regina Mckay. Date of Service: 04/28/2017 10:15 AM Medical Record Patient Account Number: [id number] Number: Treating RN: Regina Mckay 1950/08/28 (66 y.o. Other Clinician: Date of Birth/Sex: Female) Treating Regina Mckay Primary Care Regina Mckay: Regina Mckay Regina Mckay/Extender: Mckay Referring Regina Mckay: Regina Mckay in Treatment: 2 Visit Information History Since Last Visit Added or deleted any medications: No Patient Arrived: Ambulatory Any new allergies or adverse reactions: No Arrival Time: 10:10 Had a fall or experienced change in No Accompanied By: self activities of daily living that may affect Transfer Assistance: None risk of falls: Patient Identification Verified: Yes Signs or symptoms of abuse/neglect since last No Secondary Verification Process Yes visito Completed: Hospitalized since last visit: No Patient Requires Transmission-Based No Has Dressing in Place as Prescribed: Yes Precautions: Pain Present Now: No Patient Has Alerts: Yes Patient Alerts: DM II Electronic Signature(s) Signed: 04/28/2017 4:48:33 PM By: Regina Mckay, BSN, RN, CWS, Kim RN, BSN Entered By: Regina Mckay, BSN, RN, Regina Mckay on 04/28/2017 10:11:21 Regina Mckay ([id number]) -------------------------------------------------------------------------------- Clinic Level of Care Assessment Details Patient Name: [name], [name] Date of Service: 04/28/2017 10:15 AM Medical Record Patient Account Number: [id number] Number: Treating RN: Regina Mckay 1951/06/23 (66 y.o. Other Clinician: Date of Birth/Sex: Female) Treating Regina Mckay Primary Care Regina Mckay: Regina Mckay Regina Mckay Referring Eyanna Mcgonagle: Regina Mckay in Treatment: 2 Clinic Level of Care Assessment Items TOOL 4 Quantity Score []  - Use when only an EandM is performed on FOLLOW-UP visit 0 ASSESSMENTS - Nursing  Assessment / Reassessment []  - Reassessment of Co-morbidities (includes updates in patient status) 0 X - Reassessment of Adherence to Treatment Plan 1 5 ASSESSMENTS - Wound and Skin Assessment / Reassessment X - Simple Wound Assessment / Reassessment - one wound 1 5 []  - Complex Wound Assessment / Reassessment - multiple wounds 0 []  - Dermatologic / Skin Assessment (not related to wound area) 0 ASSESSMENTS - Focused Assessment []  - Circumferential Edema Measurements - multi extremities 0 []  - Nutritional Assessment / Counseling / Intervention 0 []  - Lower Extremity Assessment (monofilament, tuning fork, pulses) 0 []  - Peripheral Arterial Disease Assessment (using hand held doppler) 0 ASSESSMENTS - Ostomy and/or Continence Assessment and Care []  - Incontinence Assessment and Management 0 []  - Ostomy Care Assessment and Management (repouching, etc.) 0 PROCESS - Coordination of Care X - Simple Patient / Family Education for ongoing care 1 15 []  - Complex (extensive) Patient / Family Education for ongoing care 0 []  - Staff obtains Programmer, systems, Records, Test Results / Process Orders 0 []  - Staff telephones HHA, Nursing Homes / Clarify orders / etc 0 Regina Mckay, Regina Mckay ([id number]) []  - Routine Transfer to another Facility (non-emergent condition) 0 []  - Routine Hospital Admission (non-emergent condition) 0 []  - New Admissions / Biomedical engineer / Ordering NPWT, Apligraf, etc. 0 []  - Emergency Hospital Admission (emergent condition) 0 X - Simple Discharge Coordination 1 10 []  - Complex (extensive) Discharge Coordination 0 PROCESS - Special Needs []  - Pediatric / Minor Patient Management 0 []  - Isolation Patient Management 0 []  - Hearing / Language / Visual special needs 0 []  - Assessment of Community assistance (transportation, D/C planning, etc.) 0 []  - Additional assistance / Altered mentation 0 []  - Support Surface(s) Assessment (bed, cushion, seat, etc.) 0 INTERVENTIONS - Wound  Cleansing / Measurement X - Simple Wound Cleansing - one wound 1 5 []  - Complex Wound Cleansing - multiple wounds 0 X -  Wound Imaging (photographs - any number of wounds) 1 5 []  - Wound Tracing (instead of photographs) 0 X - Simple Wound Measurement - one wound 1 5 []  - Complex Wound Measurement - multiple wounds 0 INTERVENTIONS - Wound Dressings []  - Small Wound Dressing one or multiple wounds 0 X - Medium Wound Dressing one or multiple wounds 1 15 []  - Large Wound Dressing one or multiple wounds 0 []  - Application of Medications - topical 0 []  - Application of Medications - injection 0 Regina Mckay, Regina Mckay ([id number]) INTERVENTIONS - Miscellaneous []  - External ear exam 0 []  - Specimen Collection (cultures, biopsies, blood, body fluids, etc.) 0 []  - Specimen(s) / Culture(s) sent or taken to Lab for analysis 0 []  - Patient Transfer (multiple staff / Civil Service fast streamer / Similar devices) 0 []  - Simple Staple / Suture removal (25 or less) 0 []  - Complex Staple / Suture removal (26 or more) 0 []  - Hypo / Hyperglycemic Management (close monitor of Blood Glucose) 0 []  - Ankle / Brachial Index (ABI) - do not check if billed separately 0 X - Vital Signs 1 5 Has the patient been seen at the hospital within the last three years: Yes Total Score: 70 Level Of Care: New/Established - Level 2 Electronic Signature(s) Signed: 04/28/2017 4:48:33 PM By: Regina Mckay, BSN, RN, CWS, Kim RN, BSN Entered By: Regina Mckay, BSN, RN, Regina Mckay on 04/28/2017 10:55:22 Regina Mckay ([id number]) -------------------------------------------------------------------------------- Encounter Discharge Information Details Patient Name: [name], [name] Date of Service: 04/28/2017 10:15 AM Medical Record Patient Account Number: [id number] Number: Treating RN: Regina Mckay 05/31/1951 (66 y.o. Other Clinician: Date of Birth/Sex: Female) Treating Regina Mckay Primary Care Lillyanna Glandon: Regina Mckay Regina Mckay/Extender: Mckay Referring  Nazly Digilio: Regina Mckay in Treatment: 2 Encounter Discharge Information Items Discharge Pain Level: 0 Discharge Condition: Stable Ambulatory Status: Ambulatory Discharge Destination: Home Transportation: Private Auto Accompanied By: self Schedule Follow-up Appointment: Yes Medication Reconciliation completed and provided to Patient/Care Yes Katelyn Broadnax: Provided on Clinical Summary of Care: 04/28/2017 Form Type Recipient Paper Patient JW Electronic Signature(s) Signed: 04/28/2017 10:56:42 AM By: Regina Mckay, BSN, RN, CWS, Kim RN, BSN Entered By: Regina Mckay, BSN, RN, Regina Mckay on 04/28/2017 10:56:41 Regina Mckay ([id number]) -------------------------------------------------------------------------------- Lower Extremity Assessment Details Patient Name: COLANDRA, OHANIAN. Date of Service: 04/28/2017 10:15 AM Medical Record Patient Account Number: [id number] Number: Treating RN: Regina Mckay Dec 17, 1950 (66 y.o. Other Clinician: Date of Birth/Sex: Female) Treating Regina Mckay Primary Care Ritter Helsley: Regina Mckay Zanyla Klebba/Extender: Mckay Referring Lamin Chandley: Regina Mckay in Treatment: 2 Electronic Signature(s) Signed: 04/28/2017 4:48:33 PM By: Regina Mckay, BSN, RN, CWS, Kim RN, BSN Entered By: Regina Mckay, BSN, RN, Regina Mckay on 04/28/2017 10:18:17 Regina Mckay ([id number]) -------------------------------------------------------------------------------- Multi Wound Chart Details Patient Name: AUDREANA, HANCOX. Date of Service: 04/28/2017 10:15 AM Medical Record Patient Account Number: [id number] Number: Treating RN: Regina Mckay August 27, 1950 (66 y.o. Other Clinician: Date of Birth/Sex: Female) Treating Regina Mckay Primary Care Murl Golladay: Regina Mckay Dilynn Munroe/Extender: Mckay Referring Santresa Levett: Regina Mckay in Treatment: 2 Vital Signs Height(in): 63 Pulse(bpm): 80 Weight(lbs): 255.1 Blood Pressure 164/73 (mmHg): Body Mass Index(BMI): 45 Temperature(F): 98.5 Respiratory  Rate 16 (breaths/min): Photos: [N/A:N/A] Wound Location: Right Upper Leg - Medial, N/A N/A Proximal Wounding Event: Gradually Appeared N/A N/A Primary Etiology: Diabetic Wound/Ulcer of N/A N/A the Lower Extremity Secondary Etiology: Abscess N/A N/A Comorbid History: Cataracts, Hypertension, N/A N/A Type II Diabetes, Osteoarthritis Date Acquired: 04/07/2017 N/A N/A Weeks of Treatment: 2 N/A N/A Wound Status: Open N/A N/A Measurements L  x W x D 0.2x0.3x0.5 N/A N/A (cm) Area (cm) : 0.047 N/A N/A Volume (cm) : 0.024 N/A N/A % Reduction in Area: 92.00% N/A N/A % Reduction in Volume: 96.60% N/A N/A Classification: Grade 1 N/A N/A Exudate Amount: Medium N/A N/A Exudate Type: Serosanguineous N/A N/A Exudate Color: red, brown N/A N/A PAKOU, RAINBOW ([id number]) Wound Margin: Distinct, outline attached N/A N/A Granulation Amount: None Present (0%) N/A N/A Necrotic Amount: None Present (0%) N/A N/A Epithelialization: None N/A N/A Periwound Skin Texture: No Abnormalities Noted N/A N/A Periwound Skin No Abnormalities Noted N/A N/A Moisture: Periwound Skin Color: No Abnormalities Noted N/A N/A Temperature: No Abnormality N/A N/A Tenderness on Yes N/A N/A Palpation: Wound Preparation: Ulcer Cleansing: N/A N/A Rinsed/Irrigated with Saline Topical Anesthetic Applied: Other: lidocaine 4% Assessment Notes: Due to size and location, N/A N/A wound bed cannot be assessed. Treatment Notes Wound #1 (Right, Proximal, Medial Upper Leg) 1. Cleansed with: Clean wound with Normal Saline 3. Peri-wound Care: Skin Prep 4. Dressing Applied: Other dressing (specify in notes) 5. Secondary Dressing Applied Bordered Foam Dressing Notes SilverCel rope Electronic Signature(s) Signed: 04/28/2017 4:14:11 PM By: Linton Ham MD Entered By: Linton Ham on 04/28/2017 11:34:01 Regina Mckay, Regina Mckay  ([id number]) -------------------------------------------------------------------------------- [place] Details Patient Name: AMARRI, MICHAELSON. Date of Service: 04/28/2017 10:15 AM Medical Record Patient Account Number: [id number] Number: Treating RN: Regina Mckay [birth year] (66 y.o. Other Clinician: Date of Birth/Sex: Female) Treating Regina Mckay Primary Care Kolby Schara: Regina Mckay Weylin Plagge/Extender: Mckay Referring Kelicia Youtz: Regina Mckay in Treatment: 2 Active Inactive ` Orientation to the Wound Care Program Nursing Diagnoses: Knowledge deficit related to the wound healing center program Goals: Patient/caregiver will verbalize understanding of the [facility] Date Initiated: 04/14/2017 Target Resolution Date: 05/09/2017 Goal Status: Active Interventions: Provide education on orientation to the wound center Notes: ` Pain, Acute or Chronic Nursing Diagnoses: Pain, acute or chronic: actual or potential Potential alteration in comfort, pain Goals: Patient/caregiver will verbalize adequate pain control between visits Date Initiated: 04/14/2017 Target Resolution Date: 08/08/2017 Goal Status: Active Interventions: Complete pain assessment as per visit requirements Notes: ` Wound/Skin Impairment Regina Mckay, Regina Mckay ([id number]) Nursing Diagnoses: Impaired tissue integrity Knowledge deficit related to ulceration/compromised skin integrity Goals: Ulcer/skin breakdown will have a volume reduction of 80% by week 12 Date Initiated: 04/14/2017 Target Resolution Date: 07/04/2017 Goal Status: Active Interventions: Assess patient/caregiver ability to perform ulcer/skin care regimen upon admission and as needed Notes: Electronic Signature(s) Signed: 04/28/2017 4:48:33 PM By: Regina Mckay, BSN, RN, CWS, Kim RN, BSN Entered By: Regina Mckay, BSN, RN, Regina Mckay on 04/28/2017 10:18:34 Regina Mckay  ([id number]) -------------------------------------------------------------------------------- Pain Assessment Details Patient Name: Regina Mckay, Regina Mckay. Date of Service: 04/28/2017 10:15 AM Medical Record Patient Account Number: [id number] Number: Treating RN: Regina Mckay 1951/01/02 (66 y.o. Other Clinician: Date of Birth/Sex: Female) Treating ROBSON, Julian Primary Care Cystal Shannahan: Regina Mckay Gionni Vaca/Extender: Mckay Referring Markie Frith: Regina Mckay in Treatment: 2 Active Problems Location of Pain Severity and Description of Pain Patient Has Paino No Site Locations With Dressing Change: No Pain Management and Medication Current Pain Management: Goals for Pain Management Topical or injectable lidocaine is offered to patient for acute pain when surgical debridement is performed. If needed, Patient is instructed to use over the counter pain medication for the following 24-48 hours after debridement. Wound care MDs do not prescribed pain medications. Patient has chronic pain or uncontrolled pain. Patient has been instructed to make an appointment with their Primary Care Physician for pain management.  Electronic Signature(s) Signed: 04/28/2017 4:48:33 PM By: Regina Mckay, BSN, RN, CWS, Kim RN, BSN Entered By: Regina Mckay, BSN, RN, Regina Mckay on 04/28/2017 10:11:54 Regina Mckay ([id number]) -------------------------------------------------------------------------------- Patient/Caregiver Education Details Patient Name: Regina Mckay, Regina Mckay. Date of Service: 04/28/2017 10:15 AM Medical Record Patient Account Number: [id number] Number: Treating RN: Regina Mckay [birth year] (66 y.o. Other Clinician: Date of Birth/Gender: Female) Treating ROBSON, [place] Primary Care Physician: Regina Mckay Physician/Extender: Mckay Referring Physician: Liborio Mckay in Treatment: 2 Education Assessment Education Provided To: Caregiver Education Topics Provided Wound/Skin Impairment: Handouts: Caring for Your  Ulcer Methods: Demonstration Responses: State content correctly Electronic Signature(s) Signed: 04/28/2017 4:48:33 PM By: Regina Mckay, BSN, RN, CWS, Kim RN, BSN Entered By: Regina Mckay, BSN, RN, Regina Mckay on 04/28/2017 10:57:02 Regina Mckay, Regina Mckay ([id number]) -------------------------------------------------------------------------------- Wound Assessment Details Patient Name: Regina Mckay, Regina Mckay. Date of Service: 04/28/2017 10:15 AM Medical Record Patient Account Number: [id number] Number: Treating RN: Regina Mckay 11-28-1950 (66 y.o. Other Clinician: Date of Birth/Sex: Female) Treating ROBSON, [place] Primary Care Davianna Deutschman: Regina Mckay Aloha Bartok/Extender: Mckay Referring Elorah Dewing: Regina Mckay in Treatment: 2 Wound Status Wound Number: 1 Primary Diabetic Wound/Ulcer of the Lower Etiology: Extremity Wound Location: Right Upper Leg - Medial, Proximal Secondary Abscess Etiology: Wounding Event: Gradually Appeared Wound Status: Open Date Acquired: 04/07/2017 Comorbid Cataracts, Hypertension, Type II Weeks Of Treatment: 2 History: Diabetes, Osteoarthritis Clustered Wound: No Photos Photo Uploaded By: Regina Mckay, BSN, RN, Regina Mckay on 04/28/2017 10:50:34 Wound Measurements Length: (cm) 0.2 Width: (cm) 0.3 Depth: (cm) 0.5 Area: (cm) 0.047 Volume: (cm) 0.024 % Reduction in Area: 92% % Reduction in Volume: 96.6% Epithelialization: None Tunneling: No Undermining: No Wound Description Classification: Grade 1 Foul Odor Afte Wound Margin: Distinct, outline attached Slough/Fibrino Exudate Amount: Medium Exudate Type: Serosanguineous Exudate Color: red, brown r Cleansing: No No Wound Bed Granulation Amount: None Present (0%) Necrotic Amount: None Present (0%) Periwound Skin Texture Regina Mckay, Regina Mckay ([id number]) Texture Color No Abnormalities Noted: No No Abnormalities Noted: No Moisture Temperature / Pain No Abnormalities Noted: No Temperature: No Abnormality Tenderness on Palpation:  Yes Wound Preparation Ulcer Cleansing: Rinsed/Irrigated with Saline Topical Anesthetic Applied: Other: lidocaine 4%, Assessment Notes Due to size and location, wound bed cannot be assessed. Treatment Notes Wound #1 (Right, Proximal, Medial Upper Leg) 1. Cleansed with: Clean wound with Normal Saline 3. Peri-wound Care: Skin Prep 4. Dressing Applied: Other dressing (specify in notes) 5. Secondary Dressing Applied Bordered Foam Dressing Notes SilverCel rope Electronic Signature(s) Signed: 04/28/2017 4:48:33 PM By: Regina Mckay, BSN, RN, CWS, Kim RN, BSN Entered By: Regina Mckay, BSN, RN, Regina Mckay on 04/28/2017 10:18:10 Regina Mckay, Regina Mckay ([id number]) -------------------------------------------------------------------------------- [place] Details Patient Name: Regina Mckay, SCHIPPERS. Date of Service: 04/28/2017 10:15 AM Medical Record Patient Account Number: [id number] Number: Treating RN: Regina Mckay [birth year] (66 y.o. Other Clinician: Date of Birth/Sex: Female) Treating Regina Mckay Primary Care Antwain Caliendo: Regina Mckay Tanyla Stege/Extender: Mckay Referring Cebastian Neis: Regina Mckay in Treatment: 2 Vital Signs Time Taken: 10:11 Temperature (F): 98.5 Height (in): 63 Pulse (bpm): 80 Weight (lbs): 255.1 Respiratory Rate (breaths/min): 16 Body Mass Index (BMI): 45.2 Blood Pressure (mmHg): 164/73 Reference Range: 80 - 120 mg / dl Electronic Signature(s) Signed: 04/28/2017 4:48:33 PM By: Regina Mckay, BSN, RN, CWS, Kim RN, BSN Entered By: Regina Mckay, BSN, RN, Regina Mckay on 04/28/2017 10:12:12

## 2017-04-29 NOTE — Progress Notes (Signed)
Regina Mckay, Regina Mckay ([id number]) Visit Report for 04/28/2017 HPI Details Patient Name: Regina Mckay, Regina Mckay. Date of Service: 04/28/2017 10:15 AM Medical Record Patient Account Number: [id number] Number: Treating RN: Cornell Barman [birth year] (66 y.o. Other Clinician: Date of Birth/Sex: Female) Treating ROBSON, MICHAEL Primary Care Provider: Jani Gravel Provider/Extender: G Referring Provider: Liborio Nixon in Treatment: 2 History of Present Illness HPI Description: 04/14/17; this is a 66 year old woman who is a type II diabetic on insulin. She presented to an urgent care on [address] in [place] on 9/4 with a 4 day history of a painful area on the right upper thigh. This apparently was already draining and underwent an IandD with 2 mls of "frank pus" obtained. The wound was packed with dry gauze and a BD pads and Xeroform. She was prescribed doxycycline for 10 days which she is still taking. She is here for our review of the surgical IandD site. She apparently had a similar area in April and saw the same doctor in the same location. There is no reference to a culture result from that time frame. Culture results from this occasion showed negative anaerobic culture and negative aerobic culture. She is still taken the doxycycline. The patient does not give a history of recurrent skin abscesses other than the problem in April of this year. No noted areas in her axilla inframammary or groin area. She is a type II diabetic apparently with a last hemoglobin A1c of 8. There was very little to see and [place] link on this patient. She had cellulitis in the left leg in 2017 and a vascular ultrasound that was negative for DVT in 2017. She is listed as having chronic hep C 04/21/17; culture I did last week was negative. She's been using silver alginate packing that her friend is helping her with. Depth is down slightly fine 04/28/17; wound which is a small surgical area on the inner right  thigh for what was described as an abscess is now down to 0.5 cm. Nice improvement from the 1.2 cm last week Electronic Signature(s) Signed: 04/28/2017 4:14:11 PM By: Linton Ham MD Entered By: Linton Ham on 04/28/2017 11:34:56 Regina Mckay ([id number]) -------------------------------------------------------------------------------- Physical Exam Details Patient Name: Regina Mckay, Regina Mckay. Date of Service: 04/28/2017 10:15 AM Medical Record Patient Account Number: [id number] Number: Treating RN: Cornell Barman 05-13-1951 (66 y.o. Other Clinician: Date of Birth/Sex: Female) Treating ROBSON, MICHAEL Primary Care Provider: Jani Gravel Provider/Extender: G Referring Provider: Liborio Nixon in Treatment: 2 Constitutional Patient is hypertensive.. Pulse regular and within target range for patient.Marland Kitchen Respirations regular, non-labored and within target range.. Temperature is normal and within the target range for the patient.Marland Kitchen appears in no distress. Eyes Conjunctivae clear. No discharge. Respiratory Respiratory effort is easy and symmetric bilaterally. Rate is normal at rest and on room air.Marland Kitchen Lymphatic . None palpable in the right inguinal area. Psychiatric No evidence of depression, anxiety, or agitation. Calm, cooperative, and communicative. Appropriate interactions and affect.. Notes Wound exam; small IandD site wound on the upper right medial thigh. There is much less induration around the wound i.e. less firm tissue. The wound itself is smaller with a depth of 0.5 cm. No reason to alter current dressing Electronic Signature(s) Signed: 04/28/2017 4:14:11 PM By: Linton Ham MD Entered By: Linton Ham on 04/28/2017 11:36:24 Regina Mckay ([id number]) -------------------------------------------------------------------------------- Physician Orders Details Patient Name: Regina Mckay, Regina Mckay. Date of Service: 04/28/2017 10:15 AM Medical Record Patient Account Number:  [id number] Number: Treating RN: Gretta Cool,  Kim August 17, 1950 (66 y.o. Other Clinician: Date of Birth/Sex: Female) Treating ROBSON, MICHAEL Primary Care Provider: Jani Gravel Provider/Extender: G Referring Provider: Liborio Nixon in Treatment: 2 Verbal / Phone Orders: No Diagnosis Coding Wound Cleansing Wound #1 Right,Proximal,Medial Upper Leg o Clean wound with Normal Saline. Skin Barriers/Peri-Wound Care Wound #1 Right,Proximal,Medial Upper Leg o Skin Prep Primary Wound Dressing Wound #1 Right,Proximal,Medial Upper Leg o Other: - SilverCel Rope Secondary Dressing Wound #1 Right,Proximal,Medial Upper Leg o Boardered Foam Dressing Dressing Change Frequency Wound #1 Right,Proximal,Medial Upper Leg o Change dressing every other day. Follow-up Appointments Wound #1 Right,Proximal,Medial Upper Leg o Return Appointment in 2 weeks. Additional Orders / Instructions Wound #1 Right,Proximal,Medial Upper Leg o Increase protein intake. Medications-please add to medication list. Wound #1 Right,Proximal,Medial Upper Leg o Other: - Vitamin C, Zinc, Multivitamin Regina Mckay, Regina Mckay ([id number]) Electronic Signature(s) Signed: 04/28/2017 4:14:11 PM By: Linton Ham MD Signed: 04/28/2017 4:48:33 PM By: Gretta Cool, BSN, RN, CWS, [place] RN, BSN Entered By: Gretta Cool, BSN, RN, CWS, Kim on 04/28/2017 10:22:38 Regina Mckay, Regina Mckay ([id number]) -------------------------------------------------------------------------------- Problem List Details Patient Name: Regina Mckay, Regina Mckay. Date of Service: 04/28/2017 10:15 AM Medical Record Patient Account Number: [id number] Number: Treating RN: Cornell Barman Sep 15, 1950 (66 y.o. 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 Date of Service: 04/28/2017 10:15 AM Medical Record Patient Account Number: [id number] Number: Treating RN: Cornell Barman [birth year] (66 y.o. Other Clinician: Date of Birth/Sex: Female) Treating ROBSON, MICHAEL Primary Care Provider: Jani Gravel Provider/Extender: G Referring Provider: Liborio Nixon in Treatment: 2 Subjective History of Present Illness (HPI) 04/14/17; this is a 66 year old woman who is a type II diabetic on insulin. She presented to an urgent care on [address] in [place] on 9/4 with a 4 day history of a painful area on the right upper thigh. This apparently was already draining and underwent an IandD with 2 mls of "frank pus" obtained. The wound was packed with dry gauze and a BD pads and Xeroform. She was prescribed doxycycline for 10 days which she is still taking. She is here for our review of the surgical IandD site. She apparently had a similar area in April and saw the same doctor in the same location. There is no reference to a culture result from that time frame. Culture results from this occasion showed negative anaerobic culture and negative aerobic culture. She is still taken the doxycycline. The patient does not give a history of recurrent skin abscesses other than the problem in April of this year. No noted areas in her axilla inframammary or groin area. She is a  type II diabetic apparently with a last hemoglobin A1c of 8. There was very little to see and [place] link on this patient. She had cellulitis in the left leg in 2017 and a vascular ultrasound that was negative for DVT in 2017. She is listed as having chronic hep C 04/21/17; culture I did last week  was negative. She's been using silver alginate packing that her friend is helping her with. Depth is down slightly fine 04/28/17; wound which is a small surgical area on the inner right thigh for what was described as an abscess is now down to 0.5 cm. Nice improvement from the 1.2 cm last week Objective Constitutional Patient is hypertensive.. Pulse regular and within target range for patient.Marland Kitchen Respirations regular, non-labored and within target range.. Temperature is normal and within the target range for the patient.Marland Kitchen appears in no distress. Vitals Time Taken: 10:11 AM, Height: 63 in, Weight: 255.1 lbs, BMI: 45.2, Temperature: 98.5 F, Pulse: 80 bpm, Respiratory Rate: 16 breaths/min, Blood Pressure: 164/73 mmHg. Regina Mckay, Regina Mckay ([id number]) Eyes Conjunctivae clear. No discharge. Respiratory Respiratory effort is easy and symmetric bilaterally. Rate is normal at rest and on room air.Marland Kitchen Lymphatic None palpable in the right inguinal area. Psychiatric No evidence of depression, anxiety, or agitation. Calm, cooperative, and communicative. Appropriate interactions and affect.. General Notes: Wound exam; small IandD site wound on the upper right medial thigh. There is much less induration around the wound i.e. less firm tissue. The wound itself is smaller with a depth of 0.5 cm. No reason to alter current dressing Integumentary (Hair, Skin) Wound #1 status is Open. Original cause of wound was Gradually Appeared. The wound is located on the Right,Proximal,Medial Upper Leg. The wound measures 0.2cm length x 0.3cm width x 0.5cm depth; 0.047cm^2 area and 0.024cm^3 volume. There is no tunneling or  undermining noted. There is a medium amount of serosanguineous drainage noted. The wound margin is distinct with the outline attached to the wound base. There is no granulation within the wound bed. There is no necrotic tissue within the wound bed. Periwound temperature was noted as No Abnormality. The periwound has tenderness on palpation. General Notes: Due to size and location, wound bed cannot be assessed. 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 ([id number]) Wound #1 Right,Proximal,Medial Upper Leg: Clean wound with Normal Saline. Skin Barriers/Peri-Wound Care: Wound #1 Right,Proximal,Medial Upper Leg: Skin Prep Primary Wound Dressing: Wound #1 Right,Proximal,Medial Upper Leg: Other: - SilverCel Rope Secondary Dressing: Wound #1 Right,Proximal,Medial Upper Leg: Boardered Foam Dressing Dressing Change Frequency: Wound #1 Right,Proximal,Medial Upper Leg: Change dressing every other day. Follow-up Appointments: Wound #1 Right,Proximal,Medial Upper Leg: Return Appointment in 2 weeks. Additional Orders / Instructions: Wound #1 Right,Proximal,Medial Upper Leg: Increase protein intake. Medications-please add to medication list.: Wound #1 Right,Proximal,Medial Upper Leg: Other: - Vitamin C, Zinc, Multivitamin 1 continue silver alginate packing 2 Will follow the patient in 2 weeks' time Electronic Signature(s) Signed: 04/28/2017 4:14:11 PM By: Linton Ham MD Entered By: Linton Ham on 04/28/2017 11:37:06 Regina Mckay ([id number]) -------------------------------------------------------------------------------- SuperBill Details Patient Name: Regina Mckay. Date of Service: 04/28/2017 Medical Record  Patient Account Number: [id number] Number: Treating RN: Cornell Barman [birth year] (66 y.o. 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

## 2017-05-12 ENCOUNTER — Encounter: Payer: Federal, State, Local not specified - PPO | Attending: Internal Medicine | Admitting: Internal Medicine

## 2017-05-12 DIAGNOSIS — Z880 Allergy status to penicillin: Secondary | ICD-10-CM | POA: Diagnosis not present

## 2017-05-12 DIAGNOSIS — M1990 Unspecified osteoarthritis, unspecified site: Secondary | ICD-10-CM | POA: Insufficient documentation

## 2017-05-12 DIAGNOSIS — I10 Essential (primary) hypertension: Secondary | ICD-10-CM | POA: Insufficient documentation

## 2017-05-12 DIAGNOSIS — T8131XD Disruption of external operation (surgical) wound, not elsewhere classified, subsequent encounter: Secondary | ICD-10-CM | POA: Insufficient documentation

## 2017-05-12 DIAGNOSIS — B182 Chronic viral hepatitis C: Secondary | ICD-10-CM | POA: Diagnosis not present

## 2017-05-12 DIAGNOSIS — E119 Type 2 diabetes mellitus without complications: Secondary | ICD-10-CM | POA: Insufficient documentation

## 2017-05-12 DIAGNOSIS — L97111 Non-pressure chronic ulcer of right thigh limited to breakdown of skin: Secondary | ICD-10-CM | POA: Diagnosis not present

## 2017-05-12 DIAGNOSIS — Y839 Surgical procedure, unspecified as the cause of abnormal reaction of the patient, or of later complication, without mention of misadventure at the time of the procedure: Secondary | ICD-10-CM | POA: Insufficient documentation

## 2017-05-12 DIAGNOSIS — L02415 Cutaneous abscess of right lower limb: Secondary | ICD-10-CM | POA: Diagnosis not present

## 2017-05-12 DIAGNOSIS — Z87891 Personal history of nicotine dependence: Secondary | ICD-10-CM | POA: Diagnosis not present

## 2017-05-12 DIAGNOSIS — Z794 Long term (current) use of insulin: Secondary | ICD-10-CM | POA: Diagnosis not present

## 2017-05-13 NOTE — Progress Notes (Signed)
Regina Mckay, Regina Mckay ([id number]) Visit Report for 05/12/2017 HPI Details Patient Name: Regina Mckay, Regina Mckay. Date of Service: 05/12/2017 11:00 AM Medical Record Patient Account Number: [id number] Number: Treating RN: Regina Mckay [birth year] (66 y.o. Other Clinician: Date of Birth/Sex: Female) Treating Regina Mckay Primary Care Provider: Jani Mckay Provider/Extender: Regina Mckay Referring Provider: Liborio Mckay in Treatment: 4 History of Present Illness HPI Description: 04/14/17; this is a 66 year old woman who is a type II diabetic on insulin. She presented to an urgent care on [address] in [place] on 9/4 with a 4 day history of a painful area on the right upper thigh. This apparently was already draining and underwent an IandD with 2 mls of "frank pus" obtained. The wound was packed with dry gauze and a BD pads and Xeroform. She was prescribed doxycycline for 10 days which she is still taking. She is here for our review of the surgical IandD site. She apparently had a similar area in April and saw the same doctor in the same location. There is no reference to a culture result from that time frame. Culture results from this occasion showed negative anaerobic culture and negative aerobic culture. She is still taken the doxycycline. The patient does not give a history of recurrent skin abscesses other than the problem in April of this year. No noted areas in her axilla inframammary or groin area. She is a type II diabetic apparently with a last hemoglobin A1c of 8. There was very little to see and [place] link on this patient. She had cellulitis in the left leg in 2017 and a vascular ultrasound that was negative for DVT in 2017. She is listed as having chronic hep C 04/21/17; culture I did last week was negative. She's been using silver alginate packing that her friend is helping her with. Depth is down slightly fine 04/28/17; wound which is a small surgical area on the inner right  thigh for what was described as an abscess is now down to 0.5 cm. Nice improvement from the 1.2 cm last week 05/12/18; surgical area on the right inner thigh has closed. This was initially felt to be an abscess although no culture was positive. She took doxycycline initially. The area has closed with silver alginate packing. She is a type II diabetic. She had a previous area in the same location. Electronic Signature(s) Signed: 05/12/2017 4:33:30 PM By: Regina Ham MD Entered By: Regina Mckay on 05/12/2017 12:05:11 Regina Mckay ([id number]) -------------------------------------------------------------------------------- Physical Exam Details Patient Name: Regina Mckay, Regina Mckay. Date of Service: 05/12/2017 11:00 AM Medical Record Patient Account Number: [id number] Number: Treating RN: Regina Mckay [birth year] (66 y.o. Other Clinician: Date of Birth/Sex: Female) Treating Regina Mckay Primary Care Provider: Jani Mckay Provider/Extender: Regina Mckay Referring Provider: Liborio Mckay in Treatment: 4 Constitutional Patient is hypertensive.. Pulse regular and within target range for patient.Marland Kitchen Respirations regular, non-labored and within target range.. Temperature is normal and within the target range for the patient.Marland Kitchen appears in no distress. Eyes Conjunctivae clear. No discharge. Respiratory Respiratory effort is easy and symmetric bilaterally. Rate is normal at rest and on room air.. Genitourinary (GU) External genitalia carefully inspected there is no open lesions here, none in the groin area. Musculoskeletal Gait and station stable.. Integumentary (Hair, Skin) No skin rashes seen. No clear evidence of hidradenitis no scars. Psychiatric No evidence of depression, anxiety, or agitation. Calm, cooperative, and communicative. Appropriate interactions and affect.. Notes Wound exam; the surgical IandD site in the upper right medial thigh  is closed. There is no subcutaneous  firm tissue. We carefully looked around and compliment of our intake nurse no other worrisome areas were seen Electronic Signature(s) Signed: 05/12/2017 4:33:30 PM By: Regina Ham MD Entered By: Regina Mckay on 05/12/2017 12:11:52 Regina Mckay ([id number]) -------------------------------------------------------------------------------- Physician Orders Details Patient Name: Regina Mckay, Regina Mckay. Date of Service: 05/12/2017 11:00 AM Medical Record Patient Account Number: [id number] Number: Treating RN: Regina Mckay 1951/04/27 (66 y.o. Other Clinician: Date of Birth/Sex: Female) Treating Regina Mckay Primary Care Provider: Jani Mckay Provider/Extender: Regina Mckay Referring Provider: Liborio Mckay in Treatment: 4 Verbal / Phone Orders: No Diagnosis Coding Discharge From [facility] Services o Discharge from [place] complete Electronic Signature(s) Signed: 05/12/2017 11:00:30 AM By: Regina Cool, BSN, RN, CWS, Kim RN, BSN Signed: 05/12/2017 4:33:30 PM By: Regina Ham MD Entered By: Regina Mckay on 05/12/2017 11:00:29 Regina Mckay, Regina Mckay ([id number]) -------------------------------------------------------------------------------- Problem List Details Patient Name: [name], [name]. Date of Service: 05/12/2017 11:00 AM Medical Record Patient Account Number: [id number] Number: Treating RN: Regina Mckay 1950-11-20 (66 y.o. 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 Date of Service: 05/12/2017 11:00 AM Medical Record Patient Account Number: [id number] Number: Treating RN: Regina Mckay 1950/09/13 (66 y.o. Other Clinician: Date of Birth/Sex: Female) Treating Regina Mckay Primary Care Provider: Jani Mckay Provider/Extender: Regina Mckay Referring Provider: Liborio Mckay in Treatment: 4 Subjective History of Present Illness (HPI) 04/14/17; this is a 66 year old woman who is a type II diabetic on insulin. She presented to an urgent care on [address] in [place] on 9/4 with a 4 day history of a painful area on the right upper thigh. This apparently was already draining and underwent an IandD with 2 mls of "frank pus" obtained. The wound was packed with dry gauze and a BD pads and Xeroform. She was prescribed doxycycline for 10 days which she is still taking. She is here for our review of the surgical IandD site. She apparently had a similar area in April and saw the same doctor in the same location. There is no reference to a culture result from that time frame. Culture results from this occasion showed negative anaerobic culture and negative aerobic culture. She is still taken the doxycycline. The patient does not give a history of recurrent skin abscesses other than the problem in April of this year. No noted areas in her axilla inframammary or groin area. She is a type II diabetic apparently with a last hemoglobin A1c of 8. There was very little to see and  link on this patient. She had cellulitis in the left leg in 2017 and a vascular ultrasound that was negative for DVT in 2017. She is listed as having chronic hep  C 04/21/17; culture I did last week was negative. She's been using silver alginate packing that her friend is helping her with. Depth is down slightly fine 04/28/17; wound which is  a small surgical area on the inner right thigh for what was described as an abscess is now down to 0.5 cm. Nice improvement from the 1.2 cm last week 05/12/18; surgical area on the right inner thigh has closed. This was initially felt to be an abscess although no culture was positive. She took doxycycline initially. The area has closed with silver alginate packing. She is a type II diabetic. She had a previous area in the same location. Objective Constitutional Patient is hypertensive.. Pulse regular and within target range for patient.Marland Kitchen Respirations regular, non-labored and within target range.. Temperature is normal and within the target range for the patient.Marland Kitchen appears in no distress. Regina Mckay, Regina Mckay. ([id number]) Vitals Time Taken: 10:48 AM, Height: 63 in, Weight: 255.1 lbs, BMI: 45.2, Temperature: 98.2 F, Pulse: 82 bpm, Respiratory Rate: 16 breaths/min, Blood Pressure: 158/69 mmHg. Eyes Conjunctivae clear. No discharge. Respiratory Respiratory effort is easy and symmetric bilaterally. Rate is normal at rest and on room air.. Genitourinary (GU) External genitalia carefully inspected there is no open lesions here, none in the groin area. Musculoskeletal Gait and station stable.Marland Kitchen Psychiatric No evidence of depression, anxiety, or agitation. Calm, cooperative, and communicative. Appropriate interactions and affect.. General Notes: Wound exam; the surgical IandD site in the upper right medial thigh is closed. There is no subcutaneous firm tissue. We carefully looked around and compliment of our intake nurse no other worrisome areas were seen Integumentary (Hair, Skin) No skin rashes seen. No clear evidence of hidradenitis no scars. Wound #1 status is Healed - Epithelialized. Original cause of wound was  Gradually Appeared. The wound is located on the Right,Proximal,Medial Upper Leg. The wound measures 0cm length x 0cm width x 0cm depth; 0cm^2 area and 0cm^3 volume. There is no tunneling or undermining noted. There is a none present amount of drainage noted. The wound margin is indistinct and nonvisible. There is no granulation within the wound bed. There is no necrotic tissue within the wound bed. 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 There seems little reason to suspect hidradenitis Electronic Signature(s) Signed: 05/12/2017 4:33:30 PM By: Regina Ham MD Entered By: Regina Mckay on 05/12/2017 12:14:05 Regina Mckay ([id number]) -------------------------------------------------------------------------------- [place] Details Patient Name: Regina Mckay, Regina Mckay. Date of Service: 05/12/2017 Medical Record Patient Account Number: [id number] Number: Treating RN: Regina Mckay 07/23/1951 (66 y.o. 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  of skin L02.415 Cutaneous abscess of right lower limb Facility Procedures CPT4 Code: [id number] Description: 77824 - WOUND CARE VISIT-LEV 2 EST PT Modifier: Quantity: 1 Physician Procedures CPT4: Description Modifier Quantity Code [id number] 99213 - WC PHYS LEVEL 3 - EST PT 1 ICD-10 Description Diagnosis T81.31XD Disruption of external operation (surgical) wound, not elsewhere classified, subsequent encounter L97.111 Non-pressure  chronic ulcer  of right thigh limited to breakdown of skin Electronic Signature(s) Signed: 05/12/2017 12:28:13 PM By: Regina Cool, BSN, RN, CWS, Kim RN, BSN Signed: 05/12/2017 4:33:30 PM By: Regina Ham MD Entered By: Regina Mckay on 05/12/2017 12:28:13

## 2017-05-13 NOTE — Progress Notes (Signed)
DIJON, KOHLMAN ([id number]) Visit Report for 05/12/2017 Arrival Information Details Patient Name: Regina Mckay, Regina Mckay. Date of Service: 05/12/2017 11:00 AM Medical Record Patient Account Number: [id number] Number: Treating RN: Cornell Barman 11-Jan-1951 (66 y.o. Other Clinician: Date of Birth/Sex: Female) Treating ROBSON, MICHAEL Primary Care Seon Gaertner: Jani Gravel Lizett Chowning/Extender: G Referring Jay Kempe: Liborio Nixon in Treatment: 4 Visit Information History Since Last Visit Added or deleted any medications: No Patient Arrived: Ambulatory Any new allergies or adverse reactions: No Arrival Time: 10:46 Had a fall or experienced change in No Accompanied By: self activities of daily living that may affect Transfer Assistance: None risk of falls: Patient Identification Verified: Yes Signs or symptoms of abuse/neglect since last No Secondary Verification Process Yes visito Completed: Hospitalized since last visit: No Patient Requires Transmission-Based No Has Dressing in Place as Prescribed: Yes Precautions: Pain Present Now: No Patient Has Alerts: Yes Patient Alerts: DM II Electronic Signature(s) Signed: 05/12/2017 5:44:12 PM By: Gretta Cool, BSN, RN, CWS, Kim RN, BSN Entered By: Gretta Cool, BSN, RN, CWS, Kim on 05/12/2017 10:47:20 [name] ([id number]) -------------------------------------------------------------------------------- Clinic Level of Care Assessment Details Patient Name: Regina Mckay. Date of Service: 05/12/2017 11:00 AM Medical Record Patient Account Number: [id number] Number: Treating RN: Cornell Barman [birth year] (66 y.o. Other Clinician: Date of Birth/Sex: Female) Treating ROBSON, [place] Primary Care Jilleen Essner: Jani Gravel Matisse Roskelley/Extender: G Referring Diya Gervasi: Liborio Nixon in Treatment: 4 Clinic Level of Care Assessment Items TOOL 4 Quantity Score []  - Use when only an EandM is performed on FOLLOW-UP visit 0 ASSESSMENTS - Nursing  Assessment / Reassessment []  - Reassessment of Co-morbidities (includes updates in patient status) 0 X - Reassessment of Adherence to Treatment Plan 1 5 ASSESSMENTS - Wound and Skin Assessment / Reassessment X - Simple Wound Assessment / Reassessment - one wound 1 5 []  - Complex Wound Assessment / Reassessment - multiple wounds 0 []  - Dermatologic / Skin Assessment (not related to wound area) 0 ASSESSMENTS - Focused Assessment []  - Circumferential Edema Measurements - multi extremities 0 []  - Nutritional Assessment / Counseling / Intervention 0 []  - Lower Extremity Assessment (monofilament, tuning fork, pulses) 0 []  - Peripheral Arterial Disease Assessment (using hand held doppler) 0 ASSESSMENTS - Ostomy and/or Continence Assessment and Care []  - Incontinence Assessment and Management 0 []  - Ostomy Care Assessment and Management (repouching, etc.) 0 PROCESS - Coordination of Care X - Simple Patient / Family Education for ongoing care 1 15 []  - Complex (extensive) Patient / Family Education for ongoing care 0 X - Staff obtains Programmer, systems, Records, Test Results / Process Orders 1 10 []  - Staff telephones HHA, Nursing Homes / Clarify orders / etc 0 TAIYA, NUTTING ([id number]) []  - Routine Transfer to another Facility (non-emergent condition) 0 []  - Routine Hospital Admission (non-emergent condition) 0 []  - New Admissions / Biomedical engineer / Ordering NPWT, Apligraf, etc. 0 []  - Emergency Hospital Admission (emergent condition) 0 X - Simple Discharge Coordination 1 10 []  - Complex (extensive) Discharge Coordination 0 PROCESS - Special Needs []  - Pediatric / Minor Patient Management 0 []  - Isolation Patient Management 0 []  - Hearing / Language / Visual special needs 0 []  - Assessment of Community assistance (transportation, D/C planning, etc.) 0 []  - Additional assistance / Altered mentation 0 []  - Support Surface(s) Assessment (bed, cushion, seat, etc.) 0 INTERVENTIONS - Wound  Cleansing / Measurement X - Simple Wound Cleansing - one wound 1 5 []  - Complex Wound Cleansing - multiple wounds 0 X -  Wound Imaging (photographs - any number of wounds) 1 5 []  - Wound Tracing (instead of photographs) 0 X - Simple Wound Measurement - one wound 1 5 []  - Complex Wound Measurement - multiple wounds 0 INTERVENTIONS - Wound Dressings []  - Small Wound Dressing one or multiple wounds 0 []  - Medium Wound Dressing one or multiple wounds 0 []  - Large Wound Dressing one or multiple wounds 0 []  - Application of Medications - topical 0 []  - Application of Medications - injection 0 KARILYN, WIND ([id number]) INTERVENTIONS - Miscellaneous []  - External ear exam 0 []  - Specimen Collection (cultures, biopsies, blood, body fluids, etc.) 0 []  - Specimen(s) / Culture(s) sent or taken to Lab for analysis 0 []  - Patient Transfer (multiple staff / Civil Service fast streamer / Similar devices) 0 []  - Simple Staple / Suture removal (25 or less) 0 []  - Complex Staple / Suture removal (26 or more) 0 []  - Hypo / Hyperglycemic Management (close monitor of Blood Glucose) 0 []  - Ankle / Brachial Index (ABI) - do not check if billed separately 0 X - Vital Signs 1 5 Has the patient been seen at the hospital within the last three years: Yes Total Score: 65 Level Of Care: New/Established - Level 2 Electronic Signature(s) Signed: 05/12/2017 5:44:12 PM By: Gretta Cool, BSN, RN, CWS, Kim RN, BSN Entered By: Gretta Cool, BSN, RN, CWS, Kim on 05/12/2017 11:03:07 [name] ([id number]) -------------------------------------------------------------------------------- Encounter Discharge Information Details Patient Name: Regina Mckay. Date of Service: 05/12/2017 11:00 AM Medical Record Patient Account Number: [id number] Number: Treating RN: Cornell Barman [birth year] (66 y.o. Other Clinician: Date of Birth/Sex: Female) Treating ROBSON, MICHAEL Primary Care Kiylah Loyer: Jani Gravel Rea Kalama/Extender: G Referring Katheryn Culliton:  Liborio Nixon in Treatment: 4 Encounter Discharge Information Items Discharge Pain Level: 0 Discharge Condition: Stable Ambulatory Status: Ambulatory Discharge Destination: Home Transportation: Private Auto Accompanied By: self Schedule Follow-up Appointment: Yes Medication Reconciliation completed and provided to Patient/Care Yes Terresa Marlett: Provided on Clinical Summary of Care: 05/12/2017 Form Type Recipient Paper Patient JW Electronic Signature(s) Signed: 05/12/2017 11:03:35 AM By: Gretta Cool, BSN, RN, CWS, Kim RN, BSN Entered By: Gretta Cool, BSN, RN, CWS, Kim on 05/12/2017 11:03:35 [name] ([id number]) -------------------------------------------------------------------------------- Lower Extremity Assessment Details Patient Name: IDABELLE, MCPETERS. Date of Service: 05/12/2017 11:00 AM Medical Record Patient Account Number: [id number] Number: Treating RN: Cornell Barman [birth year] (66 y.o. Other Clinician: Date of Birth/Sex: Female) Treating ROBSON, MICHAEL Primary Care Hermelinda Diegel: Jani Gravel Yoel Kaufhold/Extender: G Referring Novak Stgermaine: Liborio Nixon in Treatment: 4 Electronic Signature(s) Signed: 05/12/2017 5:44:12 PM By: Gretta Cool, BSN, RN, CWS, Kim RN, BSN Entered By: Gretta Cool, BSN, RN, CWS, Kim on 05/12/2017 10:57:59 [name] ([id number]) -------------------------------------------------------------------------------- Multi Wound Chart Details Patient Name: TAYSHA, MAJEWSKI. Date of Service: 05/12/2017 11:00 AM Medical Record Patient Account Number: [id number] Number: Treating RN: Cornell Barman [birth year] (66 y.o. Other Clinician: Date of Birth/Sex: Female) Treating ROBSON, MICHAEL Primary Care Matasha Smigelski: Jani Gravel Nashawn Hillock/Extender: G Referring Virgil Lightner: Liborio Nixon in Treatment: 4 Vital Signs Height(in): 63 Pulse(bpm): 82 Weight(lbs): 255.1 Blood Pressure 158/69 (mmHg): Body Mass Index(BMI): 45 Temperature(F): 98.2 Respiratory  Rate 16 (breaths/min): Photos: [N/A:N/A] Wound Location: Right Upper Leg - Medial, N/A N/A Proximal Wounding Event: Gradually Appeared N/A N/A Primary Etiology: Diabetic Wound/Ulcer of N/A N/A the Lower Extremity Secondary Etiology: Abscess N/A N/A Comorbid History: Cataracts, Hypertension, N/A N/A Type II Diabetes, Osteoarthritis Date Acquired: 04/07/2017 N/A N/A Weeks of Treatment: 4 N/A N/A Wound Status: Healed - Epithelialized N/A N/A Measurements  L x W x D 0x0x0 N/A N/A (cm) Area (cm) : 0 N/A N/A Volume (cm) : 0 N/A N/A % Reduction in Area: 100.00% N/A N/A % Reduction in Volume: 100.00% N/A N/A Classification: Grade 1 N/A N/A Exudate Amount: None Present N/A N/A Wound Margin: Indistinct, nonvisible N/A N/A Granulation Amount: None Present (0%) N/A N/A MATTELYN, IMHOFF ([id number]) Necrotic Amount: None Present (0%) N/A N/A Exposed Structures: Fascia: No N/A N/A Fat Layer (Subcutaneous Tissue) Exposed: No Tendon: No Muscle: No Joint: No Bone: No Epithelialization: Large (67-100%) N/A N/A Periwound Skin Texture: No Abnormalities Noted N/A N/A Periwound Skin No Abnormalities Noted N/A N/A Moisture: Periwound Skin Color: No Abnormalities Noted N/A N/A Tenderness on No N/A N/A Palpation: Treatment Notes Electronic Signature(s) Signed: 05/12/2017 4:33:30 PM By: Linton Ham MD Previous Signature: 05/12/2017 11:00:37 AM Version By: Gretta Cool, BSN, RN, CWS, Kim RN, BSN Entered By: Linton Ham on 05/12/2017 12:03:49 [name] ([id number]) -------------------------------------------------------------------------------- Multi-Disciplinary Care Plan Details Patient Name: KRYSTIAN, FERRENTINO. Date of Service: 05/12/2017 11:00 AM Medical Record Patient Account Number: [id number] Number: Treating RN: Cornell Barman 03/02/1951 (66 y.o. Other Clinician: Date of Birth/Sex: Female) Treating ROBSON, MICHAEL Primary Care Machai Desmith: Jani Gravel Torey Reinard/Extender:  G Referring Jaedah Lords: Liborio Nixon in Treatment: 4 Active Inactive Electronic Signature(s) Signed: 05/12/2017 5:44:12 PM By: Gretta Cool, BSN, RN, CWS, Kim RN, BSN Entered By: Gretta Cool, BSN, RN, CWS, Kim on 05/12/2017 10:58:14 [name] ([id number]) -------------------------------------------------------------------------------- Pain Assessment Details Patient Name: ARNITRA, SOKOLOSKI. Date of Service: 05/12/2017 11:00 AM Medical Record Patient Account Number: [id number] Number: Treating RN: Cornell Barman February 04, 1951 (66 y.o. Other Clinician: Date of Birth/Sex: Female) Treating ROBSON, [place] Primary Care Sakura Denis: Jani Gravel Dominga Mcduffie/Extender: G Referring Liron Eissler: Liborio Nixon in Treatment: 4 Active Problems Location of Pain Severity and Description of Pain Patient Has Paino No Site Locations With Dressing Change: No Pain Management and Medication Current Pain Management: Goals for Pain Management Topical or injectable lidocaine is offered to patient for acute pain when surgical debridement is performed. If needed, Patient is instructed to use over the counter pain medication for the following 24-48 hours after debridement. Wound care MDs do not prescribed pain medications. Patient has chronic pain or uncontrolled pain. Patient has been instructed to make an appointment with their Primary Care Physician for pain management. Electronic Signature(s) Signed: 05/12/2017 5:44:12 PM By: Gretta Cool, BSN, RN, CWS, Kim RN, BSN Entered By: Gretta Cool, BSN, RN, CWS, Kim on 05/12/2017 10:47:31 [name] ([id number]) -------------------------------------------------------------------------------- Patient/Caregiver Education Details Patient Name: TIONDRA, FANG. Date of Service: 05/12/2017 11:00 AM Medical Record Patient Account Number: [id number] Number: Treating RN: Cornell Barman 07/31/1951 (66 y.o. Other Clinician: Date of Birth/Gender: Female) Treating ROBSON,  [place] Primary Care Physician: Jani Gravel Physician/Extender: G Referring Physician: Liborio Nixon in Treatment: 4 Education Assessment Education Provided To: Patient Education Topics Provided Basic Hygiene: Handouts: Other: keep skin clean and dry Methods: Explain/Verbal Responses: State content correctly Electronic Signature(s) Signed: 05/12/2017 5:44:12 PM By: Gretta Cool, BSN, RN, CWS, Kim RN, BSN Entered By: Gretta Cool, BSN, RN, CWS, Kim on 05/12/2017 11:05:58 [name] ([id number]) -------------------------------------------------------------------------------- Wound Assessment Details Patient Name: ROSELLEN, LICHTENBERGER. Date of Service: 05/12/2017 11:00 AM Medical Record Patient Account Number: [id number] Number: Treating RN: Cornell Barman 24-Aug-1950 (66 y.o. Other Clinician: Date of Birth/Sex: Female) Treating ROBSON, MICHAEL Primary Care Kaliope Quinonez: Jani Gravel Milayna Rotenberg/Extender: G Referring Alexandera Kuntzman: Liborio Nixon in Treatment: 4 Wound Status Wound Number: 1 Primary Diabetic Wound/Ulcer of the Lower Etiology:  Extremity Wound Location: Right Upper Leg - Medial, Proximal Secondary Abscess Etiology: Wounding Event: Gradually Appeared Wound Status: Healed - Epithelialized Date Acquired: 04/07/2017 Comorbid Cataracts, Hypertension, Type II Weeks Of Treatment: 4 History: Diabetes, Osteoarthritis Clustered Wound: No Photos Wound Measurements Length: (cm) 0 % Reduction i Width: (cm) 0 % Reduction i Depth: (cm) 0 Epithelializa Area: (cm) 0 Tunneling: Volume: (cm) 0 Undermining: n Area: 100% n Volume: 100% tion: Large (67-100%) No No Wound Description Classification: Grade 1 Wound Margin: Indistinct, nonvisible Exudate Amount: None Present Foul Odor After Cleansing: No Slough/Fibrino No Wound Bed Granulation Amount: None Present (0%) Exposed Structure Necrotic Amount: None Present (0%) Fascia Exposed: No Fat Layer (Subcutaneous Tissue) Exposed:  No Tendon Exposed: No Muscle Exposed: No Joint Exposed: No Bone Exposed: No NORA, SABEY ([id number]) Periwound Skin Texture Texture Color No Abnormalities Noted: No No Abnormalities Noted: No Moisture No Abnormalities Noted: No Electronic Signature(s) Signed: 05/12/2017 5:44:12 PM By: Gretta Cool, BSN, RN, CWS, Kim RN, BSN Entered By: Gretta Cool, BSN, RN, CWS, Kim on 05/12/2017 10:57:45 [name] ([id number]) -------------------------------------------------------------------------------- [place] Details Patient Name: [name] Date of Service: 05/12/2017 11:00 AM Medical Record Patient Account Number: [id number] Number: Treating RN: Cornell Barman 05/15/1951 (66 y.o. Other Clinician: Date of Birth/Sex: Female) Treating ROBSON, MICHAEL Primary Care Azelyn Batie: Jani Gravel Crosley Stejskal/Extender: G Referring Zofia Peckinpaugh: Liborio Nixon in Treatment: 4 Vital Signs Time Taken: 10:48 Temperature (F): 98.2 Height (in): 63 Pulse (bpm): 82 Weight (lbs): 255.1 Respiratory Rate (breaths/min): 16 Body Mass Index (BMI): 45.2 Blood Pressure (mmHg): 158/69 Reference Range: 80 - 120 mg / dl Electronic Signature(s) Signed: 05/12/2017 5:44:12 PM By: Gretta Cool, BSN, RN, CWS, Kim RN, BSN Entered By: Gretta Cool, BSN, RN, CWS, Kim on 05/12/2017 10:49:27

## 2019-05-17 ENCOUNTER — Emergency Department (HOSPITAL_COMMUNITY): Payer: Federal, State, Local not specified - PPO

## 2019-05-17 ENCOUNTER — Other Ambulatory Visit: Payer: Self-pay

## 2019-05-17 ENCOUNTER — Emergency Department (HOSPITAL_COMMUNITY)
Admission: EM | Admit: 2019-05-17 | Discharge: 2019-05-17 | Disposition: A | Discharge status: Home / Self Care | Payer: Federal, State, Local not specified - PPO | Attending: Emergency Medicine | Admitting: Emergency Medicine

## 2019-05-17 ENCOUNTER — Encounter (HOSPITAL_COMMUNITY): Payer: Self-pay | Admitting: Emergency Medicine

## 2019-05-17 DIAGNOSIS — I10 Essential (primary) hypertension: Secondary | ICD-10-CM | POA: Diagnosis not present

## 2019-05-17 DIAGNOSIS — R1031 Right lower quadrant pain: Secondary | ICD-10-CM | POA: Insufficient documentation

## 2019-05-17 DIAGNOSIS — Z79899 Other long term (current) drug therapy: Secondary | ICD-10-CM | POA: Diagnosis not present

## 2019-05-17 DIAGNOSIS — E860 Dehydration: Secondary | ICD-10-CM | POA: Diagnosis not present

## 2019-05-17 DIAGNOSIS — R1084 Generalized abdominal pain: Secondary | ICD-10-CM

## 2019-05-17 DIAGNOSIS — Z794 Long term (current) use of insulin: Secondary | ICD-10-CM | POA: Diagnosis not present

## 2019-05-17 DIAGNOSIS — E669 Obesity, unspecified: Secondary | ICD-10-CM | POA: Diagnosis not present

## 2019-05-17 DIAGNOSIS — E119 Type 2 diabetes mellitus without complications: Secondary | ICD-10-CM | POA: Insufficient documentation

## 2019-05-17 DIAGNOSIS — R112 Nausea with vomiting, unspecified: Secondary | ICD-10-CM

## 2019-05-17 LAB — URINALYSIS, ROUTINE W REFLEX MICROSCOPIC
Bilirubin Urine: NEGATIVE
Glucose, UA: >=500 mg/dL — AB
Hgb urine dipstick: NEGATIVE
Ketones, ur: 20 mg/dL — AB
Leukocytes,Ua: NEGATIVE
Nitrite: NEGATIVE
Protein, ur: >=300 mg/dL — AB
Specific Gravity, Urine: 1.022 (ref 1.005–1.030)
pH: 5 (ref 5.0–8.0)

## 2019-05-17 LAB — COMPREHENSIVE METABOLIC PANEL
ALT: 15 U/L (ref 0–44)
AST: 18 U/L (ref 15–41)
Albumin: 4.6 g/dL (ref 3.5–5.0)
Alkaline Phosphatase: 77 U/L (ref 38–126)
Anion gap: 17 — ABNORMAL HIGH (ref 5–15)
BUN: 21 mg/dL (ref 8–23)
CO2: 22 mmol/L (ref 22–32)
Calcium: 9.6 mg/dL (ref 8.9–10.3)
Chloride: 94 mmol/L — ABNORMAL LOW (ref 98–111)
Creatinine, Ser: 0.84 mg/dL (ref 0.44–1.00)
GFR calc Af Amer: >60 mL/min (ref >60)
GFR calc non Af Amer: >60 mL/min (ref >60)
Glucose, Bld: 293 mg/dL — ABNORMAL HIGH (ref 70–99)
Potassium: 4.2 mmol/L (ref 3.5–5.1)
Sodium: 133 mmol/L — ABNORMAL LOW (ref 135–145)
Total Bilirubin: 3.2 mg/dL — ABNORMAL HIGH (ref 0.3–1.2)
Total Protein: 8.7 g/dL — ABNORMAL HIGH (ref 6.5–8.1)

## 2019-05-17 LAB — CBC
HCT: 55.4 % — ABNORMAL HIGH (ref 36.0–46.0)
Hemoglobin: 18.8 g/dL — ABNORMAL HIGH (ref 12.0–15.0)
MCH: 29.2 pg (ref 26.0–34.0)
MCHC: 33.9 g/dL (ref 30.0–36.0)
MCV: 86.2 fL (ref 80.0–100.0)
Platelets: 262 10*3/uL (ref 150–400)
RBC: 6.43 MIL/uL — ABNORMAL HIGH (ref 3.87–5.11)
RDW: 11.8 % (ref 11.5–15.5)
WBC: 9.9 10*3/uL (ref 4.0–10.5)
nRBC: 0 % (ref 0.0–0.2)

## 2019-05-17 LAB — LIPASE, BLOOD: Lipase: 23 U/L (ref 11–51)

## 2019-05-17 MED ORDER — ONDANSETRON HCL 4 MG/2ML IJ SOLN
4.0000 mg | Freq: Once | INTRAMUSCULAR | Status: AC
Start: 2019-05-17 — End: 2019-05-17
  Administered 2019-05-17: 06:00:00 4 mg via INTRAVENOUS
  Filled 2019-05-17: qty 2

## 2019-05-17 MED ORDER — ONDANSETRON 8 MG PO TBDP: 8.0000 mg | ORAL_TABLET | Freq: Three times a day (TID) | ORAL | 0 refills | Status: DC | PRN

## 2019-05-17 MED ORDER — SODIUM CHLORIDE 0.9 % IV BOLUS
1000.0000 mL | Freq: Once | INTRAVENOUS | Status: AC
  Administered 2019-05-17: 1000 mL via INTRAVENOUS

## 2019-05-17 MED ORDER — SODIUM CHLORIDE 0.9% FLUSH
3.0000 mL | Freq: Once | INTRAVENOUS | Status: AC
  Administered 2019-05-17: 06:00:00 3 mL via INTRAVENOUS

## 2019-05-17 MED ORDER — SODIUM CHLORIDE (PF) 0.9 % IJ SOLN
INTRAMUSCULAR | Status: AC
  Filled 2019-05-17: qty 50

## 2019-05-17 MED ORDER — IOHEXOL 300 MG/ML  SOLN
100.0000 mL | Freq: Once | INTRAMUSCULAR | Status: AC | PRN
  Administered 2019-05-17: 07:00:00 100 mL via INTRAVENOUS

## 2019-05-17 MED ORDER — MORPHINE SULFATE (PF) 4 MG/ML IV SOLN
4.0000 mg | Freq: Once | INTRAVENOUS | Status: AC
Start: 2019-05-17 — End: 2019-05-17
  Administered 2019-05-17: 4 mg via INTRAVENOUS
  Filled 2019-05-17: qty 1

## 2019-05-17 NOTE — ED Provider Notes (Signed)
  Physical Exam  BP (!) 194/87   Pulse 80   Temp 98.9 F (37.2 C) (Oral)   Resp 13   SpO2 99%   Physical Exam  ED Course/Procedures     Procedures   Patient was awaiting completion of her bolus.  Dr. Dina Rich had already informed the patient of the CT findings and lab results, and she was aware that she will be discharged after IV fluids. IV fluids have been completed.  Patient's BP is elevated, I have advised her to go home and take BP meds.  She has no chest pain, neurologic symptoms, headache.       Varney Biles, MD 05/17/19 [phone]

## 2019-05-17 NOTE — ED Notes (Signed)
Pt ambulated to the bathroom without assistance. Gait steady  

## 2019-05-17 NOTE — ED Notes (Signed)
Urine and culture sent to lab  

## 2019-05-17 NOTE — ED Triage Notes (Signed)
Patient here from home with complaints of right lower quadrant abd pain, n/v that started Saturday.

## 2019-05-17 NOTE — Discharge Instructions (Addendum)
We saw you in the ER for  abdominal pain, nausea. All the results in the ER are normal, labs and CT scan imaging. We are not sure what is causing your symptoms. We did note that you were dehydrated and gave you some fluid.  The workup in the ER is not complete, and is limited to screening for life threatening and emergent conditions only, so please see a primary care doctor for further evaluation.  Please return to the ER if your symptoms worsen; you have increased pain, fevers, chills, inability to keep any medications down, confusion. Otherwise see the outpatient doctor as requested.

## 2019-05-17 NOTE — ED Provider Notes (Signed)
[facility] DEPT Provider Note   CSN: [id number] Arrival date & time: 05/17/19  0145     History   Chief Complaint Chief Complaint  Patient presents with   Abdominal Pain   Nausea   Emesis    HPI Regina Mckay is a 68 y.o. female.     HPI  This is a 68 year old female with a history of diabetes and hypertension who presents with abdominal pain.  Patient reports worsening abdominal pain over the last 2 to 3 days.  She reports that she has had multiple episodes of nonbilious, nonbloody emesis.  She initially states that over 1 week ago she was having issues with constipation.  She took something for her constipation and then had diarrhea that "would not stop."  She then took an Imodium and has not had a bowel movement since.  She reports right-sided back pain that migrated to the right lower quadrant.  She denies hematuria or dysuria.  No known history of kidney stones.  She denies fever.  Past Medical History:  Diagnosis Date   Diabetes mellitus without complication ([place])    Hypertension     There are no active problems to display for this patient.   Past Surgical History:  Procedure Laterality Date   REPLACEMENT TOTAL KNEE     TONSILLECTOMY       OB History   No obstetric history on file.      Home Medications    Prior to Admission medications   Medication Sig Start Date End Date Taking? Authorizing Provider  albuterol (PROVENTIL HFA;VENTOLIN HFA) 108 (90 BASE) MCG/ACT inhaler Inhale 2 puffs into the lungs every 4 (four) hours as needed for wheezing or shortness of breath. 07/28/13   Freddi Che, MD  amLODipine (NORVASC) 10 MG tablet Take 10 mg by mouth daily. 06/21/13   [provider]  chlorpheniramine-HYDROcodone (TUSSIONEX PENNKINETIC ER) 10-8 MG/5ML LQCR Take 5 mLs by mouth every 12 (twelve) hours as needed for cough. 07/28/13   Freddi Che, MD  dextromethorphan (DELSYM) 30 MG/5ML liquid Take 30 mg by mouth  as needed for cough.    [provider]  Diclofenac Sodium POWD Apply 1 application topically 3 (three) times daily as needed. 07/14/13   [provider]  glyBURIDE-metformin (GLUCOVANCE) 5-500 MG per tablet Take 1 tablet by mouth 3 (three) times daily. 05/04/13   [provider]  guaiFENesin (MUCINEX) 600 MG 12 hr tablet Take by mouth 2 (two) times daily.    [provider]  LANTUS SOLOSTAR 100 UNIT/ML SOPN Inject 50 Units into the skin at bedtime. 06/23/13   [provider]  lisinopril (PRINIVIL,ZESTRIL) 20 MG tablet Take 20 mg by mouth daily. 05/03/13   [provider]  pravastatin (PRAVACHOL) 40 MG tablet Take 40 mg by mouth daily. 06/21/13   [provider]    Family History No family history on file.  Social History Social History   Tobacco Use   Smoking status: Never Smoker   Smokeless tobacco: Never Used  Substance Use Topics   Alcohol use: Yes   Drug use: No     Allergies   Amoxicillin and Penicillins   Review of Systems Review of Systems  Constitutional: Negative for fever.  Respiratory: Negative for shortness of breath.   Cardiovascular: Negative for chest pain.  Gastrointestinal: Positive for abdominal pain, nausea and vomiting. Negative for constipation and diarrhea.  Genitourinary: Negative for dysuria and hematuria.  Musculoskeletal: Positive for back pain.  All  other systems reviewed and are negative.    Physical Exam Updated Vital Signs BP (!) 186/99 (BP Location: Right Arm)    Pulse 100    Temp 98.9 F (37.2 C) (Oral)    Resp 15    SpO2 98%   Physical Exam Vitals signs and nursing note reviewed.  Constitutional:      Appearance: She is well-developed. She is obese. She is not ill-appearing.  HENT:     Head: Normocephalic and atraumatic.  Neck:     Musculoskeletal: Neck supple.  Cardiovascular:     Rate and Rhythm: Normal rate and regular rhythm.     Heart sounds: Normal heart  sounds.  Pulmonary:     Effort: Pulmonary effort is normal. No respiratory distress.     Breath sounds: No wheezing.  Abdominal:     General: Bowel sounds are normal.     Palpations: Abdomen is soft.     Tenderness: There is abdominal tenderness in the right lower quadrant. There is no right CVA tenderness, left CVA tenderness, guarding or rebound. Negative signs include Rovsing's sign.  Musculoskeletal:     Right lower leg: No edema.     Left lower leg: No edema.  Skin:    General: Skin is warm and dry.  Neurological:     Mental Status: She is alert and oriented to person, place, and time.  Psychiatric:        Mood and Affect: Mood normal.      ED Treatments / Results  Labs (all labs ordered are listed, but only abnormal results are displayed) Labs Reviewed  COMPREHENSIVE METABOLIC PANEL - Abnormal; Notable for the following components:      Result Value   Sodium 133 (*)    Chloride 94 (*)    Glucose, Bld 293 (*)    Total Protein 8.7 (*)    Total Bilirubin 3.2 (*)    Anion gap 17 (*)    All other components within normal limits  CBC - Abnormal; Notable for the following components:   RBC 6.43 (*)    Hemoglobin 18.8 (*)    HCT 55.4 (*)    All other components within normal limits  URINALYSIS, ROUTINE W REFLEX MICROSCOPIC - Abnormal; Notable for the following components:   APPearance HAZY (*)    Glucose, UA >=500 (*)    Ketones, ur 20 (*)    Protein, ur >=300 (*)    Bacteria, UA RARE (*)    All other components within normal limits  LIPASE, BLOOD    EKG None  Radiology Ct Abdomen Pelvis W Contrast  Result Date: 05/17/2019 CLINICAL DATA:  Right lower quadrant pain, nausea and vomiting for 3-4 days. EXAM: CT ABDOMEN AND PELVIS WITH CONTRAST TECHNIQUE: Multidetector CT imaging of the abdomen and pelvis was performed using the standard protocol following bolus administration of intravenous contrast. CONTRAST:  100 ML OMNIPAQUE IOHEXOL 300 MG/ML  SOLN COMPARISON:  CT  abdomen and pelvis 01/27/2014. FINDINGS: Lower chest: Lung bases clear.  No pleural or pericardial effusion. Hepatobiliary: There is sludge and a few small stones in the gallbladder but no evidence of cholecystitis. Single calcification in left hepatic lobe is incidentally noted. The liver otherwise appears normal. Biliary tree is unremarkable. Pancreas: Unremarkable. No pancreatic ductal dilatation or surrounding inflammatory changes. Spleen: Normal in size without focal abnormality. Adrenals/Urinary Tract: Single small nonobstructing stones are seen in the lower pole of each kidneys. There is a small left renal cyst. The kidneys otherwise appear normal.  Ureters and urinary bladder are unremarkable. The adrenal glands appear normal. Stomach/Bowel: Stomach is within normal limits. Appendix appears normal. No evidence of bowel wall thickening, distention, or inflammatory changes. Small hiatal hernia noted. Vascular/Lymphatic: Aortic atherosclerosis. No enlarged abdominal or pelvic lymph nodes. Reproductive: Uterus and bilateral adnexa are unremarkable. Other: Small fat containing umbilical hernia is seen. Musculoskeletal: No acute abnormality. No lytic or sclerotic lesion. Degenerative disc disease T12-L1 and L4-5 noted. IMPRESSION: No acute abnormality or finding to explain the patient's symptoms. Small hiatal hernia. Single, small bilateral nonobstructing renal stones. Atherosclerosis. Tiny gallstones and gallbladder sludge without evidence of cholecystitis. Thoracic and lumbar degenerative disease. Electronically Signed   By: Inge Rise M.D.   On: 05/17/2019 07:39    Procedures Procedures (including critical care time)  Medications Ordered in ED Medications  sodium chloride (PF) 0.9 % injection (has no administration in time range)  sodium chloride 0.9 % bolus 1,000 mL (has no administration in time range)  sodium chloride flush (NS) 0.9 % injection 3 mL (3 mLs Intravenous Given 05/17/19 0553)    morphine 4 MG/ML injection 4 mg (4 mg Intravenous Given 05/17/19 0625)  ondansetron (ZOFRAN) injection 4 mg (4 mg Intravenous Given 05/17/19 0623)  iohexol (OMNIPAQUE) 300 MG/ML solution 100 mL (100 mLs Intravenous Contrast Given 05/17/19 0659)     Initial Impression / Assessment and Plan / ED Course  I have reviewed the triage vital signs and the nursing notes.  Pertinent labs & imaging results that were available during my care of the patient were reviewed by me and considered in my medical decision making (see chart for details).        Patient presents with right lower quadrant pain, back pain, nausea, vomiting.  She is overall nontoxic-appearing.  She does have some tenderness in the right lower quadrant.  No CVA tenderness.  Considerations include appendicitis, kidney stone, UTI.  Doubt ovarian pathology.  She could also have a more generalized gastritis.  She does not have any tenderness in the right upper quadrant.  Labs obtained.  Patient given pain and nausea medication.  Lab work notable for mild hyponatremia and hypochloremia resulting in an anion gap of 16.  Suspect this is related to dehydration and vomiting.  Bicarb is normal.  Doubt DKA.  She also has a hemoglobin of 18.8 which will suggest dehydration.  CT scan of the abdomen obtained.  She has nonobstructing stones and evidence of gallbladder sludge but no evidence of cholecystitis.  No evidence of appendicitis.  On reexam, patient states she feels somewhat better after pain medication.  I have ordered for fluids for dehydration.  Do not see an indication for obtaining abdominal ultrasound at this time.  After fluids the patient remains comfortable, she can be discharged home with follow-up with her primary physician.  Final Clinical Impressions(s) / ED Diagnoses   Final diagnoses:  None    ED Discharge Orders    None       Moana Munford, Barbette Hair, MD 05/17/19 [phone]

## 2020-10-09 ENCOUNTER — Emergency Department (HOSPITAL_COMMUNITY): Payer: Medicare Other

## 2020-10-09 ENCOUNTER — Encounter (HOSPITAL_COMMUNITY): Payer: Self-pay

## 2020-10-09 ENCOUNTER — Other Ambulatory Visit: Payer: Self-pay

## 2020-10-09 ENCOUNTER — Inpatient Hospital Stay (HOSPITAL_COMMUNITY)
Admission: EM | Admit: 2020-10-09 | Discharge: 2020-10-20 | DRG: 025 | Disposition: A | Discharge status: Home Health | Payer: Medicare Other | Attending: Internal Medicine | Admitting: Internal Medicine

## 2020-10-09 ENCOUNTER — Ambulatory Visit: Payer: Self-pay

## 2020-10-09 DIAGNOSIS — Z6841 Body Mass Index (BMI) 40.0 and over, adult: Secondary | ICD-10-CM

## 2020-10-09 DIAGNOSIS — R2990 Unspecified symptoms and signs involving the nervous system: Secondary | ICD-10-CM

## 2020-10-09 DIAGNOSIS — D329 Benign neoplasm of meninges, unspecified: Secondary | ICD-10-CM | POA: Diagnosis not present

## 2020-10-09 DIAGNOSIS — I10 Essential (primary) hypertension: Secondary | ICD-10-CM | POA: Diagnosis present

## 2020-10-09 DIAGNOSIS — E1165 Type 2 diabetes mellitus with hyperglycemia: Secondary | ICD-10-CM

## 2020-10-09 DIAGNOSIS — D320 Benign neoplasm of cerebral meninges: Secondary | ICD-10-CM | POA: Diagnosis not present

## 2020-10-09 DIAGNOSIS — Z7984 Long term (current) use of oral hypoglycemic drugs: Secondary | ICD-10-CM

## 2020-10-09 DIAGNOSIS — F419 Anxiety disorder, unspecified: Secondary | ICD-10-CM | POA: Diagnosis present

## 2020-10-09 DIAGNOSIS — H6121 Impacted cerumen, right ear: Secondary | ICD-10-CM | POA: Diagnosis present

## 2020-10-09 DIAGNOSIS — Z01810 Encounter for preprocedural cardiovascular examination: Secondary | ICD-10-CM

## 2020-10-09 DIAGNOSIS — I160 Hypertensive urgency: Secondary | ICD-10-CM | POA: Diagnosis not present

## 2020-10-09 DIAGNOSIS — R479 Unspecified speech disturbances: Secondary | ICD-10-CM | POA: Diagnosis present

## 2020-10-09 DIAGNOSIS — I611 Nontraumatic intracerebral hemorrhage in hemisphere, cortical: Secondary | ICD-10-CM | POA: Diagnosis not present

## 2020-10-09 DIAGNOSIS — D751 Secondary polycythemia: Secondary | ICD-10-CM

## 2020-10-09 DIAGNOSIS — R4701 Aphasia: Secondary | ICD-10-CM | POA: Diagnosis present

## 2020-10-09 DIAGNOSIS — E113211 Type 2 diabetes mellitus with mild nonproliferative diabetic retinopathy with macular edema, right eye: Secondary | ICD-10-CM | POA: Diagnosis present

## 2020-10-09 DIAGNOSIS — Z96659 Presence of unspecified artificial knee joint: Secondary | ICD-10-CM | POA: Diagnosis present

## 2020-10-09 DIAGNOSIS — R4702 Dysphasia: Secondary | ICD-10-CM | POA: Diagnosis present

## 2020-10-09 DIAGNOSIS — Z79899 Other long term (current) drug therapy: Secondary | ICD-10-CM

## 2020-10-09 DIAGNOSIS — G40209 Localization-related (focal) (partial) symptomatic epilepsy and epileptic syndromes with complex partial seizures, not intractable, without status epilepticus: Secondary | ICD-10-CM | POA: Diagnosis present

## 2020-10-09 DIAGNOSIS — Z794 Long term (current) use of insulin: Secondary | ICD-10-CM

## 2020-10-09 DIAGNOSIS — T380X5A Adverse effect of glucocorticoids and synthetic analogues, initial encounter: Secondary | ICD-10-CM | POA: Diagnosis not present

## 2020-10-09 DIAGNOSIS — Z20822 Contact with and (suspected) exposure to covid-19: Secondary | ICD-10-CM | POA: Diagnosis present

## 2020-10-09 LAB — CBC
HCT: 53.9 % — ABNORMAL HIGH (ref 36.0–46.0)
Hemoglobin: 17.9 g/dL — ABNORMAL HIGH (ref 12.0–15.0)
MCH: 28.7 pg (ref 26.0–34.0)
MCHC: 33.2 g/dL (ref 30.0–36.0)
MCV: 86.4 fL (ref 80.0–100.0)
Platelets: 218 10*3/uL (ref 150–400)
RBC: 6.24 MIL/uL — ABNORMAL HIGH (ref 3.87–5.11)
RDW: 11.7 % (ref 11.5–15.5)
WBC: 7.8 10*3/uL (ref 4.0–10.5)
nRBC: 0 % (ref 0.0–0.2)

## 2020-10-09 LAB — DIFFERENTIAL
Abs Immature Granulocytes: 0.03 10*3/uL (ref 0.00–0.07)
Basophils Absolute: 0 10*3/uL (ref 0.0–0.1)
Basophils Relative: 0 %
Eosinophils Absolute: 0 10*3/uL (ref 0.0–0.5)
Eosinophils Relative: 0 %
Immature Granulocytes: 0 %
Lymphocytes Relative: 21 %
Lymphs Abs: 1.6 10*3/uL (ref 0.7–4.0)
Monocytes Absolute: 0.6 10*3/uL (ref 0.1–1.0)
Monocytes Relative: 8 %
Neutro Abs: 5.5 10*3/uL (ref 1.7–7.7)
Neutrophils Relative %: 71 %

## 2020-10-09 LAB — RESP PANEL BY RT-PCR (FLU A&B, COVID) ARPGX2
Influenza A by PCR: NEGATIVE
Influenza B by PCR: NEGATIVE
SARS Coronavirus 2 by RT PCR: NEGATIVE

## 2020-10-09 LAB — COMPREHENSIVE METABOLIC PANEL
ALT: 18 U/L (ref 0–44)
AST: 17 U/L (ref 15–41)
Albumin: 4.1 g/dL (ref 3.5–5.0)
Alkaline Phosphatase: 67 U/L (ref 38–126)
Anion gap: 15 (ref 5–15)
BUN: 9 mg/dL (ref 8–23)
CO2: 24 mmol/L (ref 22–32)
Calcium: 9.4 mg/dL (ref 8.9–10.3)
Chloride: 94 mmol/L — ABNORMAL LOW (ref 98–111)
Creatinine, Ser: 0.89 mg/dL (ref 0.44–1.00)
GFR, Estimated: >60 mL/min (ref >60)
Glucose, Bld: 416 mg/dL — ABNORMAL HIGH (ref 70–99)
Potassium: 3.8 mmol/L (ref 3.5–5.1)
Sodium: 133 mmol/L — ABNORMAL LOW (ref 135–145)
Total Bilirubin: 1.4 mg/dL — ABNORMAL HIGH (ref 0.3–1.2)
Total Protein: 7.7 g/dL (ref 6.5–8.1)

## 2020-10-09 LAB — APTT: aPTT: 26 seconds (ref 24–36)

## 2020-10-09 LAB — CBG MONITORING, ED
Glucose-Capillary: 419 mg/dL — ABNORMAL HIGH (ref 70–99)
Glucose-Capillary: 297 mg/dL — ABNORMAL HIGH (ref 70–99)

## 2020-10-09 LAB — PROTIME-INR
INR: 1.1 (ref 0.8–1.2)
Prothrombin Time: 13.3 seconds (ref 11.4–15.2)

## 2020-10-09 MED ORDER — INSULIN ASPART 100 UNIT/ML ~~LOC~~ SOLN
0.0000 [IU] | Freq: Three times a day (TID) | SUBCUTANEOUS | Status: DC
  Administered 2020-10-10 (×2): 5 [IU] via SUBCUTANEOUS
  Administered 2020-10-10: 11 [IU] via SUBCUTANEOUS

## 2020-10-09 MED ORDER — GADOBUTROL 1 MMOL/ML IV SOLN
10.5000 mL | Freq: Once | INTRAVENOUS | Status: AC | PRN
  Administered 2020-10-09: 10.5 mL via INTRAVENOUS

## 2020-10-09 MED ORDER — AMLODIPINE BESYLATE 10 MG PO TABS
10.0000 mg | ORAL_TABLET | Freq: Every day | ORAL | Status: DC
  Administered 2020-10-09 – 2020-10-20 (×11): 10 mg via ORAL
  Filled 2020-10-09 (×8): qty 1
  Filled 2020-10-09: qty 2
  Filled 2020-10-09: qty 1
  Filled 2020-10-09: qty 2

## 2020-10-09 MED ORDER — LORAZEPAM 2 MG/ML IJ SOLN: 0.5000 mg | INTRAMUSCULAR | Status: DC | PRN

## 2020-10-09 MED ORDER — ACETAMINOPHEN 650 MG RE SUPP: 650.0000 mg | Freq: Four times a day (QID) | RECTAL | Status: DC | PRN

## 2020-10-09 MED ORDER — LISINOPRIL 20 MG PO TABS
20.0000 mg | ORAL_TABLET | Freq: Every day | ORAL | Status: DC
  Administered 2020-10-09 – 2020-10-10 (×2): 20 mg via ORAL
  Filled 2020-10-09 (×2): qty 1

## 2020-10-09 MED ORDER — POLYETHYLENE GLYCOL 3350 17 G PO PACK: 17.0000 g | PACK | Freq: Every day | ORAL | Status: DC | PRN

## 2020-10-09 MED ORDER — INSULIN ASPART 100 UNIT/ML ~~LOC~~ SOLN
10.0000 [IU] | Freq: Once | SUBCUTANEOUS | Status: AC
  Administered 2020-10-09: 10 [IU] via SUBCUTANEOUS

## 2020-10-09 MED ORDER — AMLODIPINE BESYLATE 5 MG PO TABS: 10.0000 mg | ORAL_TABLET | Freq: Once | ORAL | Status: DC

## 2020-10-09 MED ORDER — ACETAMINOPHEN 325 MG PO TABS: 650.0000 mg | ORAL_TABLET | Freq: Four times a day (QID) | ORAL | Status: DC | PRN

## 2020-10-09 MED ORDER — PRAVASTATIN SODIUM 40 MG PO TABS
40.0000 mg | ORAL_TABLET | Freq: Every day | ORAL | Status: DC
  Administered 2020-10-10 – 2020-10-19 (×9): 40 mg via ORAL
  Filled 2020-10-09 (×9): qty 1

## 2020-10-09 MED ORDER — SODIUM CHLORIDE 0.9 % IV SOLN: 250.0000 mL | INTRAVENOUS | Status: DC | PRN

## 2020-10-09 MED ORDER — LABETALOL HCL 5 MG/ML IV SOLN: 10.0000 mg | INTRAVENOUS | Status: DC | PRN

## 2020-10-09 MED ORDER — SODIUM CHLORIDE 0.9% FLUSH
3.0000 mL | Freq: Once | INTRAVENOUS | Status: AC
Start: 2020-10-09 — End: 2020-10-09
  Administered 2020-10-09: 3 mL via INTRAVENOUS

## 2020-10-09 MED ORDER — INSULIN GLARGINE 100 UNIT/ML ~~LOC~~ SOLN
25.0000 [IU] | Freq: Every day | SUBCUTANEOUS | Status: DC
  Filled 2020-10-09 (×2): qty 0.25

## 2020-10-09 MED ORDER — ONDANSETRON HCL 4 MG/2ML IJ SOLN
4.0000 mg | Freq: Four times a day (QID) | INTRAMUSCULAR | Status: DC | PRN
  Administered 2020-10-11: 4 mg via INTRAVENOUS
  Filled 2020-10-09: qty 2

## 2020-10-09 MED ORDER — INSULIN ASPART 100 UNIT/ML ~~LOC~~ SOLN
0.0000 [IU] | Freq: Every day | SUBCUTANEOUS | Status: DC
  Administered 2020-10-09: 3 [IU] via SUBCUTANEOUS
  Administered 2020-10-10: 5 [IU] via SUBCUTANEOUS

## 2020-10-09 MED ORDER — LORAZEPAM 2 MG/ML IJ SOLN
0.5000 mg | Freq: Once | INTRAMUSCULAR | Status: AC | PRN
  Administered 2020-10-09: 0.5 mg via INTRAVENOUS
  Filled 2020-10-09: qty 1

## 2020-10-09 MED ORDER — ONDANSETRON HCL 4 MG PO TABS: 4.0000 mg | ORAL_TABLET | Freq: Four times a day (QID) | ORAL | Status: DC | PRN

## 2020-10-09 MED ORDER — INSULIN ASPART 100 UNIT/ML ~~LOC~~ SOLN
4.0000 [IU] | Freq: Three times a day (TID) | SUBCUTANEOUS | Status: DC
  Administered 2020-10-10 (×3): 4 [IU] via SUBCUTANEOUS

## 2020-10-09 MED ORDER — LISINOPRIL 10 MG PO TABS: 10.0000 mg | ORAL_TABLET | Freq: Once | ORAL | Status: DC

## 2020-10-09 MED ORDER — LABETALOL HCL 5 MG/ML IV SOLN: 10.0000 mg | Freq: Once | INTRAVENOUS | Status: DC

## 2020-10-09 NOTE — ED Notes (Signed)
Patient transported to MRI 

## 2020-10-09 NOTE — H&P (Signed)
History and Physical    MARELI ANTUNES [id number] DOB: [birth year] DOA: 10/09/2020  PCP: Jani Gravel, MD   Patient coming from: home   Chief Complaint: Right hand weakness, speech difficulty   HPI: Regina Mckay is a very pleasant right-handed 70 y.o. female with medical history significant for type 2 diabetes mellitus, hypertension, and BMI 41, now presenting to the emergency department for evaluation of right hand weakness and speech disturbance.  Patient reports noticing weakness involving her right hand over the past 3 days that seems to wax and wane, and she has also had numerous episodes each of the past 3 days marked by a sensation of breathlessness and difficulty speaking.  The episodes last roughly 3 to 4 minutes at a time and she feels back to baseline between them.  There is no associated chest pain or palpitations, no associated lightheadedness or loss of consciousness, and no associated falls or shaking.  She denies any headaches, change in vision or hearing, or numbness or tingling.  She reports some chronic leg weakness that is unchanged and states that the new weakness seems to be isolated to the right hand.  She denies any recent fevers or chills.  She had never experienced these episodes until 3 days ago.  She has been out some of her medications for the past couple weeks including her antihypertensives.  ED Course: Upon arrival to the ED, patient is found to be afebrile, saturating well on room air, tachycardic in the 110s initially, and with blood pressure as high as 250/110.  EKG features sinus tachycardia with rate 112 and nonspecific ST-T abnormality similar to prior.  Chemistry panel notable for glucose of 116 and CBC features a hemoglobin of 17.9.  CT head was notable for abnormal appearance of the left temporal lobe and this was followed by MRI brain that reveals a dural based lesion in the left middle cranial fossa most consistent with meningioma and causing mild displacement  of adjacent temporal lobe.  Neurology was consulted by the ED physician and the patient was treated with 10 units NovoLog and she was given 0.5 mg IV Ativan for the MRI.    Review of Systems:  All other systems reviewed and apart from HPI, are negative.  Past Medical History:  Diagnosis Date  . Diabetes mellitus without complication ([place])   . Hypertension     Past Surgical History:  Procedure Laterality Date  . REPLACEMENT TOTAL KNEE    . TONSILLECTOMY      Social History:   reports that she has never smoked. She has never used smokeless tobacco. She reports current alcohol use. She reports that she does not use drugs.  Allergies  Allergen Reactions  . Amoxicillin Shortness Of Breath  . Penicillins Shortness Of Breath    Did it involve swelling of the face/tongue/throat, SOB, or low BP? Yes Did it involve sudden or severe rash/hives, skin peeling, or any reaction on the inside of your mouth or nose? No Did you need to seek medical attention at a hospital or doctor's office? Yes When did it last happen? If all above answers are "NO", may proceed with cephalosporin use.     History reviewed. No pertinent family history.   Prior to Admission medications   Medication Sig Start Date End Date Taking? Authorizing Provider  acetaminophen (TYLENOL) 500 MG tablet Take 500 mg by mouth every 6 (six) hours as needed.   Yes [provider]  glyBURIDE-metformin (GLUCOVANCE) 5-500 MG per tablet Take  1 tablet by mouth 3 (three) times daily. 05/04/13  Yes [provider]  Insulin Glargine (BASAGLAR KWIKPEN) 100 UNIT/ML Inject 50 Units into the skin at bedtime.   Yes [provider]  lisinopril (PRINIVIL,ZESTRIL) 20 MG tablet Take 20 mg by mouth daily. 05/03/13  Yes [provider]  Multiple Vitamins-Minerals (ZINC PO) Take 1 tablet by mouth daily.   Yes [provider]  pravastatin (PRAVACHOL) 40 MG tablet Take 40 mg by mouth daily. 06/21/13   Yes [provider]  vitamin B-12 (CYANOCOBALAMIN) 1000 MCG tablet Take 1,000 mcg by mouth daily.   Yes [provider]  vitamin C (ASCORBIC ACID) 500 MG tablet Take 500 mg by mouth daily.   Yes [provider]  amLODipine (NORVASC) 10 MG tablet Take 10 mg by mouth daily. 06/21/13   [provider]    Physical Exam: Vitals:   10/09/20 1806 10/09/20 1931 10/09/20 2045 10/09/20 2115  BP: (!) 174/76 (!) 186/92 (!) 198/141 (!) 195/87  Pulse: 85 80 81 83  Resp: 16 17 11 13   Temp: 98.5 F (36.9 C) 98.4 F (36.9 C)    TempSrc: Oral Oral    SpO2: 99% 97% 97% 97%  Weight:      Height:        Constitutional: NAD, calm  Eyes: PERTLA, lids and conjunctivae normal ENMT: Mucous membranes are moist. Posterior pharynx clear of any exudate or lesions.   Neck: normal, supple, no masses, no thyromegaly Respiratory: no wheezing, no crackles. No accessory muscle use.  Cardiovascular: S1 & S2 heard, regular rate and rhythm. No extremity edema.   Abdomen: No distension, no tenderness, soft. Bowel sounds active.  Musculoskeletal: no clubbing / cyanosis. No joint deformity upper and lower extremities.   Skin: no significant rashes, lesions, ulcers. Warm, dry, well-perfused. Neurologic: CN 2-12 grossly intact. Sensation intact. Strength 5/5 in all 4 limbs.  Psychiatric: Alert and oriented to person, place, and situation. Very pleasant and cooperative.    Labs and Imaging on Admission: I have personally reviewed following labs and imaging studies  CBC: Recent Labs  Lab 10/09/20 1337  WBC 7.8  NEUTROABS 5.5  HGB 17.9*  HCT 53.9*  MCV 86.4  PLT 885   Basic Metabolic Panel: Recent Labs  Lab 10/09/20 1337  NA 133*  K 3.8  CL 94*  CO2 24  GLUCOSE 416*  BUN 9  CREATININE 0.89  CALCIUM 9.4   GFR: Estimated Creatinine Clearance: 69.6 mL/min (by C-G formula based on SCr of 0.89 mg/dL). Liver Function Tests: Recent Labs  Lab 10/09/20 1337  AST 17  ALT  18  ALKPHOS 67  BILITOT 1.4*  PROT 7.7  ALBUMIN 4.1   No results for input(s): LIPASE, AMYLASE in the last 168 hours. No results for input(s): AMMONIA in the last 168 hours. Coagulation Profile: Recent Labs  Lab 10/09/20 1337  INR 1.1   Cardiac Enzymes: No results for input(s): CKTOTAL, CKMB, CKMBINDEX, TROPONINI in the last 168 hours. BNP (last 3 results) No results for input(s): PROBNP in the last 8760 hours. HbA1C: No results for input(s): HGBA1C in the last 72 hours. CBG: Recent Labs  Lab 10/09/20 1404  GLUCAP 419*   Lipid Profile: No results for input(s): CHOL, HDL, LDLCALC, TRIG, CHOLHDL, LDLDIRECT in the last 72 hours. Thyroid Function Tests: No results for input(s): TSH, T4TOTAL, FREET4, T3FREE, THYROIDAB in the last 72 hours. Anemia Panel: No results for input(s): VITAMINB12, FOLATE, FERRITIN, TIBC, IRON, RETICCTPCT in the last 72  hours. Urine analysis:    Component Value Date/Time   COLORURINE YELLOW 05/17/2019 0555   APPEARANCEUR HAZY (A) 05/17/2019 0555   LABSPEC 1.022 05/17/2019 0555   PHURINE 5.0 05/17/2019 0555   GLUCOSEU >=500 (A) 05/17/2019 0555   HGBUR NEGATIVE 05/17/2019 0555   BILIRUBINUR NEGATIVE 05/17/2019 0555   KETONESUR 20 (A) 05/17/2019 0555   PROTEINUR >=300 (A) 05/17/2019 0555   UROBILINOGEN 1.0 10/18/2009 1052   NITRITE NEGATIVE 05/17/2019 0555   LEUKOCYTESUR NEGATIVE 05/17/2019 0555   Sepsis Labs: @LABRCNTIP (procalcitonin:4,lacticidven:4) )No results found for this or any previous visit (from the past 240 hour(s)).   Radiological Exams on Admission: CT HEAD WO CONTRAST  Result Date: 10/09/2020 CLINICAL DATA:  Three day history of right-sided weakness. History of hypertension. EXAM: CT HEAD WITHOUT CONTRAST TECHNIQUE: Contiguous axial images were obtained from the base of the skull through the vertex without intravenous contrast. COMPARISON:  Prior study from 2006. FINDINGS: Brain: The ventricles are in the midline without mass effect  or shift. They are normal in size and configuration for the patient's age and degree of cerebral atrophy. No extra-axial fluid collections are identified. The gray-white differentiation is maintained. There is an abnormal appearance of the left temporal lobe. The sulci are compressed and the left temporal horn is also compressed. Vague low-attenuation noted in the central portion of the temporal lobe. This could be a subacute infarct. Neoplasm would be another possibility. In the right clinical situation herpes would be a consideration also. Recommend MRI brain without and with contrast for further evaluation. Vascular: No hyperdense vessels or obvious aneurysm. Moderate atherosclerotic calcifications. Skull: No skull lesions or fractures. Mild hyperostosis frontalis interna. Sinuses/Orbits: The paranasal sinuses and mastoid air cells are clear. The globes are intact. Other: No scalp lesions or scalp hematoma. IMPRESSION: 1. Abnormal appearance of the left temporal lobe. This could be a subacute infarct. Neoplasm would be another possibility. In the right clinical situation herpes would be a consideration also. Recommend MRI brain without and with contrast for further evaluation. 2. No other significant findings. Electronically Signed   By: Marijo Sanes M.D.   On: 10/09/2020 15:10   MR Brain W and Wo Contrast  Result Date: 10/09/2020 CLINICAL DATA:  Abnormal CT, hypertensive with right-sided weakness EXAM: MRI HEAD WITHOUT AND WITH CONTRAST TECHNIQUE: Multiplanar, multiecho pulse sequences of the brain and surrounding structures were obtained without and with intravenous contrast. CONTRAST:  10.24mL GADAVIST GADOBUTROL 1 MMOL/ML IV SOLN COMPARISON:  Correlation made with same day head CT FINDINGS: Brain: Relatively homogeneously enhancing dural-based lesion is present within the left middle cranial fossa. Measures approximately 3.6 x 3.8 x 2.1 cm. There is some associated susceptibility medially likely reflecting  mineralization. Dural enhancement is present along the adjacent tentorial incisura. No evidence of cavernous sinus or orbital apex involvement. Mild mass effect on the left temporal lobe but no apparent parenchymal edema. There is no acute infarction. No additional mass or abnormal enhancement. Ventricles and sulci are normal in size and configuration. Minimal patchy foci of T2 hyperintensity in the supratentorial white matter are nonspecific but may reflect minor chronic microvascular ischemic changes. Vascular: Major vessel flow voids at the skull base are preserved. Skull and upper cervical spine: Normal marrow signal is preserved. Sinuses/Orbits: Paranasal sinuses are aerated. Orbits are unremarkable. Other: Sella is unremarkable.  Mastoid air cells are clear. IMPRESSION: Dural-based lesion of the left middle cranial fossa most consistent with a meningioma. Mild displacement of the adjacent temporal lobe without parenchymal edema. Electronically Signed  By: Macy Mis M.D.   On: 10/09/2020 20:30    EKG: Independently reviewed. Sinus tachycardia, rate 112, non-specific ST-T abnormalities similar to prior.   Assessment/Plan   1. Transient speech disturbance; right hand weakness; left middle cranial fossa lesion  - Presents with 3 days of right hand weakness and recurrent episodes of speech difficulty and is found to have dural-based lesion with mild displacement of left temporal lobe  - Neurology consulting and much appreciated, will follow-up on recommendations     2. Hypertensive urgency  - BP as high as 250/110 in ED in setting of anxiety and running out of her antihypertensives  - Improved with Ativan in ED  - Resume Norvasc and lisinopril now, then use labetalol IVPs if needed    3. Uncontrolled type II DM  - Serum glucose is 416 in ED without DKA  - She has been out of her oral diabetes medications and had not used insulin in a couple days  - She was given 10 units Novolog in ED  -  CBG checks, insulin   4. Polycythemia  - Hgb 17.9 on admission  - Patient not hypovolemic, denies smoking or lung disease, denies chest pain, and no CVA on MRI  - Check serum epo    DVT prophylaxis: SCDs  Code Status: Full  Level of Care: Level of care: Telemetry Medical Family Communication: Family on speaker phone in ED  Disposition Plan:  Patient is from: home  Anticipated d/c is to: Home  Anticipated d/c date is: Possibly as early as 10/10/20 Patient currently: Pending neurology consultation, BP control, EEG, and possibly NSG consultation  Consults called: Neurology  Admission status: Observation     Vianne Bulls, MD [facility]  10/09/2020, 10:04 PM

## 2020-10-09 NOTE — ED Triage Notes (Addendum)
Pt reports she started having stroke like s/s x3 days. Pt is hypertensive in triage. Pt reports she is weaker on the right side. Pt reports she also having trouble finding her words.

## 2020-10-09 NOTE — ED Notes (Signed)
Back to room at this time.

## 2020-10-09 NOTE — ED Provider Notes (Addendum)
[facility] EMERGENCY DEPARTMENT Provider Note   CSN: [id number] Arrival date & time: 10/09/20  1322     History Chief Complaint  Patient presents with  . stroke like s/s    Regina Mckay is a 70 y.o. female.  HPI Patient reports about 3 days she has not been feeling right.  She reports she is getting episodes where she will feel somewhat short of breath and like someone that has "sucked the wind out of her".  She reports when that happens she cannot seem to verbalize anything.  She reports she can kind of stutter or clear her throat until the episode passes.  She reports this happened maybe 8 times in the past 3 days.  She denies she is experiencing any chest pain.  She does feel extra fatigue but does not notice that she is getting significant exertional shortness of breath.  No fever no cough.  Patient denies she is getting blurred vision or double vision.  No partial loss of vision.  No focal weakness numbness or tingling of extremities.  Patient reports that she has been off of several medications for about 2 weeks.  She reports that she ran out and then her doctors office told her that she had not been seen for a long time and that her doctor left, thereby she was no longer patient with the clinic.  She has been able to establish a follow-up appointment coming up in the next 6 days.  She did run out of her glyburide\Metformin and amlodipine.  She has been able to continue with her injectable insulin and lisinopril.  Patient also notes that she has made some dietary changes.  She has eliminated a lot of meat from her diet.  She reports she has had some loose stools since that time.  She has not been having any abdominal pain.    Past Medical History:  Diagnosis Date  . Diabetes mellitus without complication ([place])   . Hypertension     There are no problems to display for this patient.   Past Surgical History:  Procedure Laterality Date  . REPLACEMENT TOTAL KNEE     . TONSILLECTOMY       OB History   No obstetric history on file.     History reviewed. No pertinent family history.  Social History   Tobacco Use  . Smoking status: Never Smoker  . Smokeless tobacco: Never Used  Substance Use Topics  . Alcohol use: Yes    Comment: occassional  . Drug use: No    Comment: occassional just a puff    Home Medications Prior to Admission medications   Medication Sig Start Date End Date Taking? Authorizing Provider  acetaminophen (TYLENOL) 500 MG tablet Take 500 mg by mouth every 6 (six) hours as needed.   Yes [provider]  glyBURIDE-metformin (GLUCOVANCE) 5-500 MG per tablet Take 1 tablet by mouth 3 (three) times daily. 05/04/13  Yes [provider]  Insulin Glargine (BASAGLAR KWIKPEN) 100 UNIT/ML Inject 50 Units into the skin at bedtime.   Yes [provider]  lisinopril (PRINIVIL,ZESTRIL) 20 MG tablet Take 20 mg by mouth daily. 05/03/13  Yes [provider]  Multiple Vitamins-Minerals (ZINC PO) Take 1 tablet by mouth daily.   Yes [provider]  pravastatin (PRAVACHOL) 40 MG tablet Take 40 mg by mouth daily. 06/21/13  Yes [provider]  vitamin B-12 (CYANOCOBALAMIN) 1000 MCG tablet Take 1,000 mcg by mouth daily.   Yes  [provider]  vitamin C (ASCORBIC ACID) 500 MG tablet Take 500 mg by mouth daily.   Yes [provider]  albuterol (PROVENTIL HFA;VENTOLIN HFA) 108 (90 BASE) MCG/ACT inhaler Inhale 2 puffs into the lungs every 4 (four) hours as needed for wheezing or shortness of breath. Patient not taking: No sig reported 07/28/13   Freddi Che, MD  amLODipine (NORVASC) 10 MG tablet Take 10 mg by mouth daily. 06/21/13   [provider]  chlorpheniramine-HYDROcodone (TUSSIONEX PENNKINETIC ER) 10-8 MG/5ML LQCR Take 5 mLs by mouth every 12 (twelve) hours as needed for cough. Patient not taking: No sig reported 07/28/13   Freddi Che, MD  fluticasone [facility]) 50  MCG/ACT nasal spray Place 2 sprays into both nostrils at bedtime. Patient not taking: No sig reported 03/18/19   [provider]  ondansetron (ZOFRAN ODT) 8 MG disintegrating tablet Take 1 tablet (8 mg total) by mouth every 8 (eight) hours as needed for nausea. Patient not taking: No sig reported 05/17/19   Varney Biles, MD    Allergies    Amoxicillin and Penicillins  Review of Systems   Review of Systems 10 systems reviewed and negative except as per HPI Physical Exam Updated Vital Signs BP (!) 166/97   Pulse 92   Temp 98.6 F (37 C)   Resp 17   Ht 5\' 4"  (1.626 m)   Wt 108 kg   SpO2 96%   BMI 40.85 kg/m   Physical Exam Constitutional:      Comments: Alert nontoxic.  No respiratory distress.  Mental status clear.  HENT:     Head: Normocephalic and atraumatic.     Mouth/Throat:     Mouth: Mucous membranes are moist.     Pharynx: Oropharynx is clear.  Eyes:     Extraocular Movements: Extraocular movements intact.     Pupils: Pupils are equal, round, and reactive to light.  Cardiovascular:     Rate and Rhythm: Normal rate and regular rhythm.  Pulmonary:     Effort: Pulmonary effort is normal.     Breath sounds: Normal breath sounds.  Abdominal:     General: There is no distension.     Palpations: Abdomen is soft.     Tenderness: There is no abdominal tenderness. There is no guarding.  Musculoskeletal:        General: No swelling or tenderness. Normal range of motion.     Right lower leg: No edema.     Left lower leg: No edema.  Skin:    General: Skin is warm and dry.  Neurological:     General: No focal deficit present.     Mental Status: She is oriented to person, place, and time.     Cranial Nerves: No cranial nerve deficit.     Motor: No weakness.     Coordination: Coordination normal.     Comments: Speech is clear with normal content.  Shock" no tach.  No cranial nerve deficit.  Motor strength 5\5 upper and lower extremities.  Movements are  coordinated purposeful symmetric.  Psychiatric:        Mood and Affect: Mood normal.     ED Results / Procedures / Treatments   Labs (all labs ordered are listed, but only abnormal results are displayed) Labs Reviewed  CBC - Abnormal; Notable for the following components:      Result Value   RBC 6.24 (*)    Hemoglobin 17.9 (*)    HCT 53.9 (*)  All other components within normal limits  COMPREHENSIVE METABOLIC PANEL - Abnormal; Notable for the following components:   Sodium 133 (*)    Chloride 94 (*)    Glucose, Bld 416 (*)    Total Bilirubin 1.4 (*)    All other components within normal limits  CBG MONITORING, ED - Abnormal; Notable for the following components:   Glucose-Capillary 419 (*)    All other components within normal limits  PROTIME-INR  APTT  DIFFERENTIAL    EKG EKG Interpretation  Date/Time:  Tuesday October 09 2020 13:26:09 EST Ventricular Rate:  112 PR Interval:  176 QRS Duration: 64 QT Interval:  314 QTC Calculation: 428 R Axis:   72 Text Interpretation: Sinus tachycardia ST & T wave abnormality, consider inferior ischemia Abnormal ECG no change from previous Confirmed by Charlesetta Shanks [phone]) on 10/09/2020 3:05:22 PM   Radiology CT HEAD WO CONTRAST  Result Date: 10/09/2020 CLINICAL DATA:  Three day history of right-sided weakness. History of hypertension. EXAM: CT HEAD WITHOUT CONTRAST TECHNIQUE: Contiguous axial images were obtained from the base of the skull through the vertex without intravenous contrast. COMPARISON:  Prior study from 2006. FINDINGS: Brain: The ventricles are in the midline without mass effect or shift. They are normal in size and configuration for the patient's age and degree of cerebral atrophy. No extra-axial fluid collections are identified. The gray-white differentiation is maintained. There is an abnormal appearance of the left temporal lobe. The sulci are compressed and the left temporal horn is also compressed. Vague  low-attenuation noted in the central portion of the temporal lobe. This could be a subacute infarct. Neoplasm would be another possibility. In the right clinical situation herpes would be a consideration also. Recommend MRI brain without and with contrast for further evaluation. Vascular: No hyperdense vessels or obvious aneurysm. Moderate atherosclerotic calcifications. Skull: No skull lesions or fractures. Mild hyperostosis frontalis interna. Sinuses/Orbits: The paranasal sinuses and mastoid air cells are clear. The globes are intact. Other: No scalp lesions or scalp hematoma. IMPRESSION: 1. Abnormal appearance of the left temporal lobe. This could be a subacute infarct. Neoplasm would be another possibility. In the right clinical situation herpes would be a consideration also. Recommend MRI brain without and with contrast for further evaluation. 2. No other significant findings. Electronically Signed   By: Marijo Sanes M.D.   On: 10/09/2020 15:10    Procedures Procedures   Medications Ordered in ED Medications  LORazepam (ATIVAN) injection 0.5 mg (has no administration in time range)  LORazepam (ATIVAN) injection 0.5 mg (has no administration in time range)  sodium chloride flush (NS) 0.9 % injection 3 mL (3 mLs Intravenous Given 10/09/20 1625)  insulin aspart (novoLOG) injection 10 Units (10 Units Subcutaneous Given 10/09/20 1600)    ED Course  I have reviewed the triage vital signs and the nursing notes.  Pertinent labs & imaging results that were available during my care of the patient were reviewed by me and considered in my medical decision making (see chart for details).    MDM Rules/Calculators/A&P                           Patient has had waxing and waning symptoms over the past 3 days.  She is experiencing periods where she feels that her breath is leaving her and she cannot generate speech.  During examination, patient is alert with clear speech and intact cognitive function.  She  has not exhibiting any  expressive or receptive aphasia.  On arrival, blood pressures are significantly elevated.  Patient reports she has been out of medications for several days.  Examining her pill bottles, she is out of amlodipine but does still have lisinopril.  She is also out of her glyburide\Metformin but reports she does have her insulin autoinjector that she is still using.   Blood pressures have been trending down to systolics in the 471E and 170s without medication.  Will hold BP meds until diagnostic evaluation for stroke is completed.  Patient CT head shows area of concern for possible subacute stroke versus other lesion with recommendation for MRI.  I have rechecked on the patient, she remains alert and appropriate.  She does not have any focal neurologic deficits.  Blood pressures are in the 160s over 90s.  She is stable for MRI.  She reports she would like a sedative medication for MRI feeling that she will be claustrophobic.  Dr. Melina Copa will review MRI results and assess patient for final disposition.   Final Clinical Impression(s) / ED Diagnoses Final diagnoses:  Essential hypertension  Abnormal CT scan of head    Rx / DC Orders ED Discharge Orders    None       Charlesetta Shanks, MD 10/09/20 1659    Charlesetta Shanks, MD 10/09/20 1701

## 2020-10-09 NOTE — ED Notes (Addendum)
Messaged Dr opyd regarding 6hr covid swab on pt.

## 2020-10-09 NOTE — Consult Note (Signed)
NEURO HOSPITALIST CONSULT NOTE   Requestig physician: Dr. Melina Copa  Reason for Consult: Transient spells of speechlessness  History obtained from:  Patient and Chart     HPI:                                                                                                                                          Regina Mckay is an 70 y.o. female with a PMHx of DM and HTN who presents to the ED with recurrent spells of expressive dysphasia over the past 6 days. She was hypertensive in Triage and recurrent TIAs were initially suspected. However, MRI revealed a large left anterior temporal lobe dural based, lobulated and avidly enhancing mass appearing most consistent with a meningioma.   She describes the dysphasic spells as beginning with a sense of inability to get a full breath followed by word-finding difficulty and halting speech. She estimates that she has had about 15 such spells since her symptoms started on Thursday. The spells have varied in duration, the longest lasting for about 4 minutes. She endorses having right hand weakness and incoordination at the same time as the spells, but this symptom has been longer in duration, lasting up to 30 minutes. She endorses some left hand incoordination as well, but it is not as prominent as on the right. She denies any jerking, twitching, facial droop, lip smacking or other automatisms, leg weakness or sensory numbness.   Additional history obtained by EDP has been reviewed: "Patient reports about 3 days she has not been feeling right.  She reports she is getting episodes where she will feel somewhat short of breath and like someone that has "sucked the wind out of her".  She reports when that happens she cannot seem to verbalize anything.  She reports she can kind of stutter or clear her throat until the episode passes.  She reports this happened maybe 8 times in the past 3 days.  She denies she is experiencing any chest pain.  She  does feel extra fatigue but does not notice that she is getting significant exertional shortness of breath.  No fever no cough.  Patient denies she is getting blurred vision or double vision.  No partial loss of vision.  No focal weakness numbness or tingling of extremities.  Patient reports that she has been off of several medications for about 2 weeks.  She reports that she ran out and then her doctors office told her that she had not been seen for a long time and that her doctor left, thereby she was no longer patient with the clinic.  She has been able to establish a follow-up appointment coming up in the next 6 days.  She did run out of her glyburide\Metformin and amlodipine.  She  has been able to continue with her injectable insulin and lisinopril.  Patient also notes that she has made some dietary changes.  She has eliminated a lot of meat from her diet.  She reports she has had some loose stools since that time.  She has not been having any abdominal pain."  Past Medical History:  Diagnosis Date  . Diabetes mellitus without complication ([place])   . Hypertension     Past Surgical History:  Procedure Laterality Date  . REPLACEMENT TOTAL KNEE    . TONSILLECTOMY      History reviewed. No pertinent family history.            Social History:  reports that she has never smoked. She has never used smokeless tobacco. She reports current alcohol use. She reports that she does not use drugs.  Allergies  Allergen Reactions  . Amoxicillin Shortness Of Breath  . Penicillins Shortness Of Breath    Did it involve swelling of the face/tongue/throat, SOB, or low BP? Yes Did it involve sudden or severe rash/hives, skin peeling, or any reaction on the inside of your mouth or nose? No Did you need to seek medical attention at a hospital or doctor's office? Yes When did it last happen? If all above answers are "NO", may proceed with cephalosporin use.     MEDICATIONS:                                                                                                                      No current facility-administered medications on file prior to encounter.   Current Outpatient Medications on File Prior to Encounter  Medication Sig Dispense Refill  . acetaminophen (TYLENOL) 500 MG tablet Take 500 mg by mouth every 6 (six) hours as needed.    . glyBURIDE-metformin (GLUCOVANCE) 5-500 MG per tablet Take 1 tablet by mouth 3 (three) times daily.    . Insulin Glargine (BASAGLAR KWIKPEN) 100 UNIT/ML Inject 50 Units into the skin at bedtime.    Marland Kitchen lisinopril (PRINIVIL,ZESTRIL) 20 MG tablet Take 20 mg by mouth daily.    . Multiple Vitamins-Minerals (ZINC PO) Take 1 tablet by mouth daily.    . pravastatin (PRAVACHOL) 40 MG tablet Take 40 mg by mouth daily.    . vitamin B-12 (CYANOCOBALAMIN) 1000 MCG tablet Take 1,000 mcg by mouth daily.    . vitamin C (ASCORBIC ACID) 500 MG tablet Take 500 mg by mouth daily.    Marland Kitchen amLODipine (NORVASC) 10 MG tablet Take 10 mg by mouth daily.       ROS:  Has had bilateral hand weakness and incoordination with the spells of dysphasia, right hand worse than left. Has also had some symptoms of dysphagia for about 2 months. Denies vision changes, facial weakness, leg weakness, proximal arm weakness, limb numbness, gait unsteadiness and incoordination. Other symptoms as per HPI with comprehensive ROS otherwise negative.   Blood pressure (!) 198/141, pulse 81, temperature 98.4 F (36.9 C), temperature source Oral, resp. rate 11, height 5\' 4"  (1.626 m), weight 108 kg, SpO2 97 %.   General Examination:                                                                                                       Physical Exam  General - Morbidly obese HEENT-  [place]/AT   Lungs- Respirations unlabored Extremities- No edema  Neurological  Examination Mental Status: Alert, fully oriented, thought content appropriate.  Speech fluent without evidence of aphasia at the time of exam.  Able to follow all commands without difficulty. Cranial Nerves: II: Temporal viisual fields intact with no extinction to DSS. PERRL III,IV, VI: No ptosis. EOMI. No nystagmus.  V,VII: Smile symmetric, facial temp sensation decreased on the right VIII: Hearing intact to conversation IX,X: No hoarseness or hypophonia XI: Symmetric XII: Midline tongue extension Motor: Right : Upper extremity   5/5    Left:     Upper extremity   5/5  Lower extremity   5/5     Lower extremity   5/5 Normal tone throughout; no atrophy noted Sensory: Light touch intact throughout, bilaterally. No extinction to DSS. Temp sensation equal to BUE, but decreased to RLE relative to the left.  Deep Tendon Reflexes: 1+ bilateral brachioradialis. Unable to elicit patellar or achilles reflexes.  Plantars: Right: downgoing   Left: downgoing Cerebellar: No ataxia with FNF bilaterally  Gait: Deferred   Lab Results: Basic Metabolic Panel: Recent Labs  Lab 10/09/20 1337  NA 133*  K 3.8  CL 94*  CO2 24  GLUCOSE 416*  BUN 9  CREATININE 0.89  CALCIUM 9.4    CBC: Recent Labs  Lab 10/09/20 1337  WBC 7.8  NEUTROABS 5.5  HGB 17.9*  HCT 53.9*  MCV 86.4  PLT 218    Cardiac Enzymes: No results for input(s): CKTOTAL, CKMB, CKMBINDEX, TROPONINI in the last 168 hours.  Lipid Panel: No results for input(s): CHOL, TRIG, HDL, CHOLHDL, VLDL, LDLCALC in the last 168 hours.  Imaging: CT HEAD WO CONTRAST  Result Date: 10/09/2020 CLINICAL DATA:  Three day history of right-sided weakness. History of hypertension. EXAM: CT HEAD WITHOUT CONTRAST TECHNIQUE: Contiguous axial images were obtained from the base of the skull through the vertex without intravenous contrast. COMPARISON:  Prior study from 2006. FINDINGS: Brain: The ventricles are in the midline without mass effect or shift.  They are normal in size and configuration for the patient's age and degree of cerebral atrophy. No extra-axial fluid collections are identified. The gray-white differentiation is maintained. There is an abnormal appearance of the left temporal lobe. The sulci are compressed and the left temporal horn is also compressed. Vague low-attenuation  noted in the central portion of the temporal lobe. This could be a subacute infarct. Neoplasm would be another possibility. In the right clinical situation herpes would be a consideration also. Recommend MRI brain without and with contrast for further evaluation. Vascular: No hyperdense vessels or obvious aneurysm. Moderate atherosclerotic calcifications. Skull: No skull lesions or fractures. Mild hyperostosis frontalis interna. Sinuses/Orbits: The paranasal sinuses and mastoid air cells are clear. The globes are intact. Other: No scalp lesions or scalp hematoma. IMPRESSION: 1. Abnormal appearance of the left temporal lobe. This could be a subacute infarct. Neoplasm would be another possibility. In the right clinical situation herpes would be a consideration also. Recommend MRI brain without and with contrast for further evaluation. 2. No other significant findings. Electronically Signed   By: Marijo Sanes M.D.   On: 10/09/2020 15:10   MR Brain W and Wo Contrast  Result Date: 10/09/2020 CLINICAL DATA:  Abnormal CT, hypertensive with right-sided weakness EXAM: MRI HEAD WITHOUT AND WITH CONTRAST TECHNIQUE: Multiplanar, multiecho pulse sequences of the brain and surrounding structures were obtained without and with intravenous contrast. CONTRAST:  10.88mL GADAVIST GADOBUTROL 1 MMOL/ML IV SOLN COMPARISON:  Correlation made with same day head CT FINDINGS: Brain: Relatively homogeneously enhancing dural-based lesion is present within the left middle cranial fossa. Measures approximately 3.6 x 3.8 x 2.1 cm. There is some associated susceptibility medially likely reflecting  mineralization. Dural enhancement is present along the adjacent tentorial incisura. No evidence of cavernous sinus or orbital apex involvement. Mild mass effect on the left temporal lobe but no apparent parenchymal edema. There is no acute infarction. No additional mass or abnormal enhancement. Ventricles and sulci are normal in size and configuration. Minimal patchy foci of T2 hyperintensity in the supratentorial white matter are nonspecific but may reflect minor chronic microvascular ischemic changes. Vascular: Major vessel flow voids at the skull base are preserved. Skull and upper cervical spine: Normal marrow signal is preserved. Sinuses/Orbits: Paranasal sinuses are aerated. Orbits are unremarkable. Other: Sella is unremarkable.  Mastoid air cells are clear. IMPRESSION: Dural-based lesion of the left middle cranial fossa most consistent with a meningioma. Mild displacement of the adjacent temporal lobe without parenchymal edema. Electronically Signed   By: Macy Mis M.D.   On: 10/09/2020 20:30    Assessment: 70 year old female with a PMHx of DM and HTN who presents to the ED with recurrent spells of expressive dysphasia over the past 6 days.  1. Neurological exam is nonfocal. 2. MRI brain: Dural-based lesion of the left middle cranial fossa most consistent with a meningioma. Mild displacement of the adjacent temporal lobe without parenchymal edema. 3. 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   Recommendations: 1. EEG in the AM 2. Seizure precautions 3. Neurosurgery consult 4. Most likely will need to start an anticonvulsant after EEG, as the stereotyped semiology of her events in conjunction with the imaged left temporal lobe lesion most likely presents sufficient clinical information to diagnose her with partial complex seizures, even if the EEG is negative. However we are holding any anticonvulsant  until after EEG in order to increase the sensitivity of this test.   Electronically signed: Dr. Kerney Elbe 10/09/2020, 9:01 PM

## 2020-10-09 NOTE — ED Notes (Signed)
Pt IS NOT IN MRI at this time. Still waiting for transport.

## 2020-10-09 NOTE — ED Provider Notes (Signed)
Signout from Dr. Vallery Ridge.  70 year old female complaining of difficulty getting out her words.  She had some subtle abnormality in her head CT and is pending an MRI.  Disposition depends on results of MRI.  If no significant abnormalities can be discharged to follow-up with PCP. Physical Exam  BP (!) 186/92 (BP Location: Left Arm)   Pulse 80   Temp 98.4 F (36.9 C) (Oral)   Resp 17   Ht 5\' 4"  (1.626 m)   Wt 108 kg   SpO2 97%   BMI 40.85 kg/m   Physical Exam  ED Course/Procedures     Procedures  MDM  Reviewed case and image findings with Dr. Cheral Marker from neurology.  He is recommending admission to the hospital, EEG in the morning, neuro consult to follow probably will also need neurosurgery input tomorrow.  Patient is updated and agreeable to plan.  9:30 PM discussed with [facility] hospitalist Dr. Myna Hidalgo who will evaluate the patient for admission.       Hayden Rasmussen, MD 10/09/20 2130

## 2020-10-10 ENCOUNTER — Observation Stay (HOSPITAL_COMMUNITY): Payer: Medicare Other

## 2020-10-10 ENCOUNTER — Inpatient Hospital Stay (HOSPITAL_COMMUNITY): Payer: Medicare Other

## 2020-10-10 ENCOUNTER — Other Ambulatory Visit: Payer: Self-pay | Admitting: Neurosurgery

## 2020-10-10 DIAGNOSIS — D329 Benign neoplasm of meninges, unspecified: Secondary | ICD-10-CM | POA: Diagnosis not present

## 2020-10-10 DIAGNOSIS — Z79899 Other long term (current) drug therapy: Secondary | ICD-10-CM | POA: Diagnosis not present

## 2020-10-10 DIAGNOSIS — R479 Unspecified speech disturbances: Secondary | ICD-10-CM | POA: Diagnosis present

## 2020-10-10 DIAGNOSIS — R2990 Unspecified symptoms and signs involving the nervous system: Secondary | ICD-10-CM | POA: Diagnosis not present

## 2020-10-10 DIAGNOSIS — Z7984 Long term (current) use of oral hypoglycemic drugs: Secondary | ICD-10-CM | POA: Diagnosis not present

## 2020-10-10 DIAGNOSIS — Z96659 Presence of unspecified artificial knee joint: Secondary | ICD-10-CM | POA: Diagnosis present

## 2020-10-10 DIAGNOSIS — G40209 Localization-related (focal) (partial) symptomatic epilepsy and epileptic syndromes with complex partial seizures, not intractable, without status epilepticus: Secondary | ICD-10-CM | POA: Diagnosis not present

## 2020-10-10 DIAGNOSIS — F419 Anxiety disorder, unspecified: Secondary | ICD-10-CM | POA: Diagnosis present

## 2020-10-10 DIAGNOSIS — I160 Hypertensive urgency: Secondary | ICD-10-CM | POA: Diagnosis not present

## 2020-10-10 DIAGNOSIS — E1165 Type 2 diabetes mellitus with hyperglycemia: Secondary | ICD-10-CM | POA: Diagnosis present

## 2020-10-10 DIAGNOSIS — R4701 Aphasia: Secondary | ICD-10-CM | POA: Diagnosis not present

## 2020-10-10 DIAGNOSIS — Z6841 Body Mass Index (BMI) 40.0 and over, adult: Secondary | ICD-10-CM | POA: Diagnosis not present

## 2020-10-10 DIAGNOSIS — I10 Essential (primary) hypertension: Secondary | ICD-10-CM | POA: Diagnosis not present

## 2020-10-10 DIAGNOSIS — D751 Secondary polycythemia: Secondary | ICD-10-CM | POA: Diagnosis not present

## 2020-10-10 DIAGNOSIS — Z20822 Contact with and (suspected) exposure to covid-19: Secondary | ICD-10-CM | POA: Diagnosis not present

## 2020-10-10 DIAGNOSIS — R4702 Dysphasia: Secondary | ICD-10-CM | POA: Diagnosis present

## 2020-10-10 DIAGNOSIS — I611 Nontraumatic intracerebral hemorrhage in hemisphere, cortical: Secondary | ICD-10-CM | POA: Diagnosis not present

## 2020-10-10 DIAGNOSIS — D320 Benign neoplasm of cerebral meninges: Secondary | ICD-10-CM | POA: Diagnosis not present

## 2020-10-10 DIAGNOSIS — H6121 Impacted cerumen, right ear: Secondary | ICD-10-CM | POA: Diagnosis present

## 2020-10-10 DIAGNOSIS — T380X5A Adverse effect of glucocorticoids and synthetic analogues, initial encounter: Secondary | ICD-10-CM | POA: Diagnosis not present

## 2020-10-10 DIAGNOSIS — Z794 Long term (current) use of insulin: Secondary | ICD-10-CM | POA: Diagnosis not present

## 2020-10-10 LAB — BASIC METABOLIC PANEL
Anion gap: 12 (ref 5–15)
BUN: 10 mg/dL (ref 8–23)
CO2: 25 mmol/L (ref 22–32)
Calcium: 9.5 mg/dL (ref 8.9–10.3)
Chloride: 96 mmol/L — ABNORMAL LOW (ref 98–111)
Creatinine, Ser: 0.8 mg/dL (ref 0.44–1.00)
GFR, Estimated: >60 mL/min (ref >60)
Glucose, Bld: 325 mg/dL — ABNORMAL HIGH (ref 70–99)
Potassium: 3.8 mmol/L (ref 3.5–5.1)
Sodium: 133 mmol/L — ABNORMAL LOW (ref 135–145)

## 2020-10-10 LAB — CBG MONITORING, ED
Glucose-Capillary: 322 mg/dL — ABNORMAL HIGH (ref 70–99)
Glucose-Capillary: 221 mg/dL — ABNORMAL HIGH (ref 70–99)
Glucose-Capillary: 246 mg/dL — ABNORMAL HIGH (ref 70–99)
Glucose-Capillary: 242 mg/dL — ABNORMAL HIGH (ref 70–99)
Glucose-Capillary: 389 mg/dL — ABNORMAL HIGH (ref 70–99)

## 2020-10-10 LAB — CBC
HCT: 51.2 % — ABNORMAL HIGH (ref 36.0–46.0)
Hemoglobin: 17.2 g/dL — ABNORMAL HIGH (ref 12.0–15.0)
MCH: 29 pg (ref 26.0–34.0)
MCHC: 33.6 g/dL (ref 30.0–36.0)
MCV: 86.2 fL (ref 80.0–100.0)
Platelets: 203 10*3/uL (ref 150–400)
RBC: 5.94 MIL/uL — ABNORMAL HIGH (ref 3.87–5.11)
RDW: 11.9 % (ref 11.5–15.5)
WBC: 6.1 10*3/uL (ref 4.0–10.5)
nRBC: 0 % (ref 0.0–0.2)

## 2020-10-10 LAB — HEMOGLOBIN A1C
Hgb A1c MFr Bld: 13.6 % — ABNORMAL HIGH (ref 4.8–5.6)
Mean Plasma Glucose: 343.62 mg/dL

## 2020-10-10 MED ORDER — GADOBUTROL 1 MMOL/ML IV SOLN
10.0000 mL | Freq: Once | INTRAVENOUS | Status: AC | PRN
  Administered 2020-10-10: 10 mL via INTRAVENOUS

## 2020-10-10 MED ORDER — LORAZEPAM 0.5 MG PO TABS
0.5000 mg | ORAL_TABLET | Freq: Four times a day (QID) | ORAL | Status: AC | PRN
  Administered 2020-10-10 – 2020-10-14 (×2): 0.5 mg via ORAL
  Filled 2020-10-10 (×2): qty 1

## 2020-10-10 MED ORDER — LEVETIRACETAM 500 MG PO TABS
500.0000 mg | ORAL_TABLET | Freq: Two times a day (BID) | ORAL | Status: DC
  Administered 2020-10-10 – 2020-10-20 (×19): 500 mg via ORAL
  Filled 2020-10-10 (×19): qty 1

## 2020-10-10 MED ORDER — POTASSIUM CHLORIDE IN NACL 20-0.9 MEQ/L-% IV SOLN
INTRAVENOUS | Status: DC
  Filled 2020-10-10: qty 1000

## 2020-10-10 MED ORDER — INSULIN GLARGINE 100 UNIT/ML ~~LOC~~ SOLN
25.0000 [IU] | Freq: Every day | SUBCUTANEOUS | Status: DC
  Administered 2020-10-10 – 2020-10-12 (×2): 25 [IU] via SUBCUTANEOUS
  Filled 2020-10-10 (×4): qty 0.25

## 2020-10-10 MED ORDER — VANCOMYCIN HCL 1500 MG/300ML IV SOLN
1500.0000 mg | INTRAVENOUS | Status: DC
  Filled 2020-10-10: qty 300

## 2020-10-10 NOTE — Consult Note (Signed)
CC: speech difficulty  HPI:     Patient is a 70 y.o. female with DM presented to the ER for transient episodes of expressive dysphasia and right hand weakness for the past 6 days.  These episodes would last 3-4 minutes long and she would return to normal in between them.  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  She was found to be hypertensive in the ER.   Patient Active Problem List   Diagnosis Date Noted  . Episode of transient neurologic symptoms 10/09/2020  . Uncontrolled diabetes mellitus with hyperglycemia ([place]) 10/09/2020  . Polycythemia 10/09/2020  . Hypertensive urgency 10/09/2020  . Meningioma [facility])    Past Medical History:  Diagnosis Date  . Diabetes mellitus without complication ([place])   . Hypertension     Past Surgical History:  Procedure Laterality Date  . REPLACEMENT TOTAL KNEE    . TONSILLECTOMY      (Not in a hospital admission)  Allergies  Allergen Reactions  . Amoxicillin Shortness Of Breath  . Penicillins Shortness Of Breath    Did it involve swelling of the face/tongue/throat, SOB, or low BP? Yes Did it involve sudden or severe rash/hives, skin peeling, or any reaction on the inside of your mouth or nose? No Did you need to seek medical attention at a hospital or doctor's office? Yes When did it last happen? If all above answers are "NO", may proceed with cephalosporin use.     Social History   Tobacco Use  . Smoking status: Never Smoker  . Smokeless tobacco: Never Used  Substance Use Topics  . Alcohol use: Yes    Comment: occassional    History reviewed. No pertinent family history.   Review of Systems Pertinent items are noted in HPI.  Objective:   Patient Vitals for the past 8 hrs:  BP Temp Temp src Pulse Resp SpO2  10/10/20 1040 (!) 171/73 98.1 F (36.7 C) Oral 94 16 98 %  10/10/20 0935 (!) 162/77 - - - - -  10/10/20 0933 (!) 162/77 - - - - -  10/10/20 0833 - 98.7 F (37.1 C) Oral  - - -  10/10/20 0630 137/65 - - 81 20 94 %  10/10/20 0533 (!) 144/54 - - 81 (!) 21 95 %   I/O last 3 completed shifts: In: 3 [I.V.:3] Out: -  No intake/output data recorded.      General : Alert, cooperative, no distress, appears stated age   Head:  Normocephalic/atraumatic    Eyes: PERRL, conjunctiva/corneas clear, EOM's intact. Fundi could not be visualized Neck: Supple Chest:  Respirations unlabored Chest wall: no tenderness or deformity Heart: Regular rate and rhythm Abdomen: Soft, nontender and nondistended Extremities: warm and well-perfused Skin: normal turgor, color and texture Neurologic:  Alert, oriented x 3.  Eyes open spontaneously. PERRL, EOMI, VFC, no facial droop. V1-3 intact.  No dysarthria, tongue protrusion symmetric.  CNII-XII intact. Normal strength, sensation and reflexes throughout.  Mild right drift.        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 Fairly broad dural attachment seen.  Assessment:  70 yo F with DM, HTN who presented with seizures who has a left middle fossa meningioma   Principal Problem:   Episode of transient neurologic symptoms Active Problems:   Uncontrolled diabetes mellitus with hyperglycemia (HCC)   Polycythemia   Hypertensive urgency   Plan:   -  I had a long discussion with the patient.  I discussed the natural history of meningiomas and treatment options and rationale.  Given the significant size of the meningioma, I do recommend resection.  Risks, benefits, alternatives, and expected convalescence were discussed.  She wished to proceed.

## 2020-10-10 NOTE — Progress Notes (Signed)
NEUROLOGY PROGRESS NOTE  Subjective: Patient resting comfortably in the room. She states that earlier today she had an episode of speech difficulty when the diabetic educator was in the room. Different AED options were discussed. She was informed about Keppra and possible side effects including irritation, drowsiness and exacerbation of depression. She states that she has been depressed in the past but never clinically diagnosed or placed on medication for it. Being depressed has never left her debilitated and unable to function/complete ADLs.   Exam: Vitals:   10/10/20 1400 10/10/20 1708  BP: (!) 128/52 140/68  Pulse: 91 91  Resp: (!) 22 18  Temp:    SpO2: 92% 97%   Physical Exam  Constitutional: Appears well-developed and well-nourished.  Psych: Affect appropriate to situation Cardiovascular: Normal rate and regular rhythm.   Neuro:  Mental Status: Alert, oriented, thought content appropriate, follows commands  Speech: Fluent with repetition and naming intact  Cranial Nerves: EOMI, VFF, Face symmetric, Sensation intact to touch to V1/V2/V3 Tongue midline, Shoulder shrug intact  Tone and bulk: Normal tone throughout; no atrophy noted Sensory: Pinprick and light touch intact throughout, bilaterally Deep Tendon Reflexes: Deferred  Cerebellar: FNF intact  Gait: Deferred    Pertinent Labs/Diagnostics:  CT Head WO Contrast 10/09/20 1. Abnormal appearance of the left temporal lobe. This could be a subacute infarct. Neoplasm would be another possibility. In the right clinical situation herpes would be a consideration also. Recommend MRI brain without and with contrast for further evaluation. 2. No other significant findings.  Routine EEG 10/10/20 This study is suggestive of nonspecific cortical dysfunction in left temporal region.  No seizures or epileptiform discharges were seen throughout the recording.  MRI Brain 10/09/20 Dural-based lesion of the left middle cranial fossa most  consistent with a meningioma. Mild displacement of the adjacent temporal lobe without parenchymal edema.  Assessment and Plan:  Regina Mckay is a 70 year old female w/pmh DMII and HTN who presents with focal seizures w/expressive aphasia + right hand weakness and incoordination in the setting of a large left middle fossa meningioma that was found this admission via CTH/MRI Brain. Though her routine EEG does not show epileptiform discharges her meningioma raises her propensity to seize and she has continued to have focal seizures.   Recommendations - Initiate Keppra 500 mg BID, patient instructed that she can take B6 100 mg QD with Keppra if she experiences irritability  - Outpatient neurology follow up at discharge with [facility] (referral placed) for future AED management   Neurology will sign off, thank you for consulting. Please reach out via Amion with any questions.   Horrace Hanak,NP  [facility] 10/10/2020, 5:42 PM

## 2020-10-10 NOTE — ED Notes (Signed)
To MRI via stretcher  

## 2020-10-10 NOTE — Progress Notes (Signed)
Inpatient Diabetes Program Recommendations  AACE/ADA: New Consensus Statement on Inpatient Glycemic Control (2015)  Target Ranges:  Prepandial:   less than 140 mg/dL      Peak postprandial:   less than 180 mg/dL (1-2 hours)      Critically ill patients:  140 - 180 mg/dL   Lab Results  Component Value Date   GLUCAP 221 (H) 10/10/2020   HGBA1C 13.6 (H) 10/10/2020    Review of Glycemic Control  Diabetes history: DM 2 (was seeing Dr. Maudie Mercury, will make new appt with PCP in that office) Outpatient Diabetes medications: Glucovance 5-500 mg tid, Basaglar 50 units qhs Current orders for Inpatient glycemic control:  Lantus 25 units Novolog 0-15 units tid + hs Novolog 4 units tid meal coverage  A1c 13.6% this admission  Spoke with pt at bedside. Pt reports needing a new PCP after not seeing hers for awhile. Had a new appt scheduled that she has to cancel due to surgery and recovery. Pt confirmed being out of medications and not being able to obtain refills due to the length of time it had been seeing a doctor.  Pt reports having plenty of Basaglar but is out of her Glucovance and Amlodipine. Pt will need refills for discharge. Pt also mentioned she needs a new meter at time of d/c.  Discussed A1c to pt. Spoke with pt about glucose control while in the hospital and for recovery at time of d/c.  D/c: Blood glucose meter kit order # [id number] Refill on Glucovance Refill on Amlodipine  Thanks,  Tama Headings RN, MSN, BC-ADM Inpatient Diabetes Coordinator Team Pager [phone] (8a-5p)

## 2020-10-10 NOTE — Procedures (Signed)
Patient Name: IRAN KIEVIT  MRN: [id number]  Epilepsy Attending: Lora Havens  Referring Physician/Provider: Dr. Mitzi Hansen Date: 10/10/2020 Duration: 22.40 mins  Patient history: 70 year old female with a PMHx of DM and HTN who presents to the ED with recurrent spells of expressive dysphasia over the past 6 days. EEG to evaluate for seizures  Level of alertness: Awake, asleep  AEDs during EEG study: None  Technical aspects: This EEG study was done with scalp electrodes positioned according to the 10-20 International system of electrode placement. Electrical activity was acquired at a sampling rate of 500Hz  and reviewed with a high frequency filter of 70Hz  and a low frequency filter of 1Hz . EEG data were recorded continuously and digitally stored.   Description: The posterior dominant rhythm consists of 9-10 Hz activity of moderate voltage (25-35 uV) seen predominantly in posterior head regions, symmetric and reactive to eye opening and eye closing. Sleep was characterized by vertex waves, sleep spindles (12 to 14 Hz), maximal frontocentral region. EEG showed intermittent  3 to 6 Hz theta-delta slowing in left temporal region. Hyperventilation did not show any EEG change. Physiologic photic driving was not seen during photic stimulation.   ABNORMALITY -Intermittent slow, left temporal region  IMPRESSION: This study is suggestive of nonspecific cortical dysfunction in left temporal region.  No seizures or epileptiform discharges were seen throughout the recording.  Amedee Cerrone Barbra Sarks

## 2020-10-10 NOTE — ED Notes (Signed)
EEG in process via EEG staff

## 2020-10-10 NOTE — Progress Notes (Signed)
PROGRESS NOTE    Regina Mckay  NLG:[id number] DOB: 03/30/1951 DOA: 10/09/2020 PCP: Jani Gravel, MD   Chief Complaint  Patient presents with  . stroke like s/s    Brief Narrative:   Regina Mckay is Regina Mckay very pleasant right-handed 70 y.o. female with medical history significant for type 2 diabetes mellitus, hypertension, and BMI 36, now presenting to the emergency department for evaluation of right hand weakness and speech disturbance.   Imaging notable for dural based lesion of the left middle cranial fossal most c/w meningioma.  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  Neurosurgery c/s pending.    Assessment & Plan:   Principal Problem:   Episode of transient neurologic symptoms Active Problems:   Uncontrolled diabetes mellitus with hyperglycemia (HCC)   Polycythemia   Hypertensive urgency  1. Transient speech disturbance  right hand weakness  left middle cranial fossa lesion  - Neurology consulting and much appreciated -> concerning for new onset of partial complex seizures secondary to left temporal lobe meningioma - recommended EEG in AM, seizure precautions, neurosurgery consult  - MRI with dural based lesion of left middle cranial fossa most c/w meningioma, mild displacement of adjacent temporal lobe without parenchymal edema - appreciate neurosurgery recommendations  2. Hypertensive urgency  - BP as high as 250/110 in ED in setting of anxiety and running out of her antihypertensives  - Improved with Ativan in ED  - Resume Norvasc and lisinopril now - improved at this time, follow   3. Uncontrolled type II DM  - Serum glucose is 416 in ED without DKA  - A1c 13.6 - She has been out of her oral diabetes medications and had not used insulin in [place] couple days  - resume home insulin regimen, adjust and follow   4. Polycythemia  - Hgb 17.9 on admission  - Patient not hypovolemic, denies smoking or  lung disease, denies chest pain, and no CVA on MRI  - Check serum epo  - workup further outpatient  Hasn't seen her PCP for several years, will probably need refills of most meds at discharged.  She's got appt with new doc in practice.   DVT prophylaxis: SCD Code Status: full  Family Communication: none at bedside Disposition:   Status is: Observation  The patient will require care spanning > 2 midnights and should be moved to inpatient because: Inpatient level of care appropriate due to severity of illness  Dispo: The patient is from: Home              Anticipated d/c is to: Home              Patient currently is not medically stable to d/c.   Difficult to place patient No       Consultants:   Neurology  neurosurgery  Procedures:  EEG pending  Antimicrobials: Anti-infectives (From admission, onward)   None         Subjective: Notes R hand weakness, transient this AM Speech difficulties intermittently since Thursday  Objective: Vitals:   10/10/20 0315 10/10/20 0533 10/10/20 0630 10/10/20 0833  BP: (!) 129/55 (!) 144/54 137/65   Pulse: 85 81 81   Resp: 18 (!) 21 20   Temp:    98.7 F (37.1 C)  TempSrc:    Oral  SpO2: 98% 95% 94%   Weight:      Height:        Intake/Output  Summary (Last 24 hours) at 10/10/2020 0834 Last data filed at 10/09/2020 1625 Gross per 24 hour  Intake 3 ml  Output --  Net 3 ml   Filed Weights   10/09/20 1401  Weight: 108 kg    Examination:  General exam: Appears calm and comfortable  Respiratory system: Clear to auscultation. Respiratory effort normal. Cardiovascular system: S1 & S2 heard, RRR.  Gastrointestinal system: Abdomen is nondistended, soft and nontender.  Central nervous system: Alert and oriented. CN 2-12 intact.  No sensory deficits to light touch. Extremities: Symmetric 5 x 5 power. Skin: No rashes, lesions or ulcers Psychiatry: Judgement and insight appear normal. Mood & affect appropriate.     Data  Reviewed: I have personally reviewed following labs and imaging studies  CBC: Recent Labs  Lab 10/09/20 1337 10/10/20 0443  WBC 7.8 6.1  NEUTROABS 5.5  --   HGB 17.9* 17.2*  HCT 53.9* 51.2*  MCV 86.4 86.2  PLT 218 431    Basic Metabolic Panel: Recent Labs  Lab 10/09/20 1337 10/10/20 0443  NA 133* 133*  K 3.8 3.8  CL 94* 96*  CO2 24 25  GLUCOSE 416* 325*  BUN 9 10  CREATININE 0.89 0.80  CALCIUM 9.4 9.5    GFR: Estimated Creatinine Clearance: 77.4 mL/min (by C-G formula based on SCr of 0.8 mg/dL).  Liver Function Tests: Recent Labs  Lab 10/09/20 1337  AST 17  ALT 18  ALKPHOS 67  BILITOT 1.4*  PROT 7.7  ALBUMIN 4.1    CBG: Recent Labs  Lab 10/09/20 1404 10/09/20 2204 10/10/20 0745  GLUCAP 419* 297* 322*     Recent Results (from the past 240 hour(s))  Resp Panel by RT-PCR (Flu Aquarius Latouche&B, Covid) Nasopharyngeal Swab     Status: None   Collection Time: 10/09/20  9:09 PM   Specimen: Nasopharyngeal Swab; Nasopharyngeal(NP) swabs in vial transport medium  Result Value Ref Range Status   SARS Coronavirus 2 by RT PCR NEGATIVE NEGATIVE Final    Comment: (NOTE) SARS-CoV-2 target nucleic acids are NOT DETECTED.  The SARS-CoV-2 RNA is generally detectable in upper respiratory specimens during the acute phase of infection. The lowest concentration of SARS-CoV-2 viral copies this assay can detect is 138 copies/mL. Emran Molzahn negative result does not preclude SARS-Cov-2 infection and should not be used as the sole basis for treatment or other patient management decisions. Marsheila Alejo negative result may occur with  improper specimen collection/handling, submission of specimen other than nasopharyngeal swab, presence of viral mutation(s) within the areas targeted by this assay, and inadequate number of viral copies(<138 copies/mL). Aitanna Haubner negative result must be combined with clinical observations, patient history, and epidemiological information. The expected result is Negative.  Fact  Sheet for Patients:  EntrepreneurPulse.com.au  Fact Sheet for Healthcare Providers:  IncredibleEmployment.be  This test is no t yet approved or cleared by the [place] FDA and  has been authorized for detection and/or diagnosis of SARS-CoV-2 by FDA under an Emergency Use Authorization (EUA). This EUA will remain  in effect (meaning this test can be used) for the duration of the COVID-19 declaration under Section 564(b)(1) of the Act, 21 U.S.C.section 360bbb-3(b)(1), unless the authorization is terminated  or revoked sooner.       Influenza Allysa Governale by PCR NEGATIVE NEGATIVE Final   Influenza B by PCR NEGATIVE NEGATIVE Final    Comment: (NOTE) The Xpert Xpress SARS-CoV-2/FLU/RSV plus assay is intended as an aid in the diagnosis of influenza from Nasopharyngeal swab specimens and should not  be used as Tiearra Colwell sole basis for treatment. Nasal washings and aspirates are unacceptable for Xpert Xpress SARS-CoV-2/FLU/RSV testing.  Fact Sheet for Patients: EntrepreneurPulse.com.au  Fact Sheet for Healthcare Providers: IncredibleEmployment.be  This test is not yet approved or cleared by the [place] FDA and has been authorized for detection and/or diagnosis of SARS-CoV-2 by FDA under an Emergency Use Authorization (EUA). This EUA will remain in effect (meaning this test can be used) for the duration of the COVID-19 declaration under Section 564(b)(1) of the Act, 21 U.S.C. section 360bbb-3(b)(1), unless the authorization is terminated or revoked.  Performed at [facility], [place] [address] [place], [place] [address]          Radiology Studies: CT HEAD WO CONTRAST  Result Date: 10/09/2020 CLINICAL DATA:  Three day history of right-sided weakness. History of hypertension. EXAM: CT HEAD WITHOUT CONTRAST TECHNIQUE: Contiguous axial images were obtained from the base of the skull through the vertex without  intravenous contrast. COMPARISON:  Prior study from 2006. FINDINGS: Brain: The ventricles are in the midline without mass effect or shift. They are normal in size and configuration for the patient's age and degree of cerebral atrophy. No extra-axial fluid collections are identified. The gray-white differentiation is maintained. There is an abnormal appearance of the left temporal lobe. The sulci are compressed and the left temporal horn is also compressed. Vague low-attenuation noted in the central portion of the temporal lobe. This could be Delsin Copen subacute infarct. Neoplasm would be another possibility. In the right clinical situation herpes would be Kinzey Sheriff consideration also. Recommend MRI brain without and with contrast for further evaluation. Vascular: No hyperdense vessels or obvious aneurysm. Moderate atherosclerotic calcifications. Skull: No skull lesions or fractures. Mild hyperostosis frontalis interna. Sinuses/Orbits: The paranasal sinuses and mastoid air cells are clear. The globes are intact. Other: No scalp lesions or scalp hematoma. IMPRESSION: 1. Abnormal appearance of the left temporal lobe. This could be Jamelyn Bovard subacute infarct. Neoplasm would be another possibility. In the right clinical situation herpes would be Schneur Crowson consideration also. Recommend MRI brain without and with contrast for further evaluation. 2. No other significant findings. Electronically Signed   By: Marijo Sanes M.D.   On: 10/09/2020 15:10   MR Brain W and Wo Contrast  Result Date: 10/09/2020 CLINICAL DATA:  Abnormal CT, hypertensive with right-sided weakness EXAM: MRI HEAD WITHOUT AND WITH CONTRAST TECHNIQUE: Multiplanar, multiecho pulse sequences of the brain and surrounding structures were obtained without and with intravenous contrast. CONTRAST:  10.40mL GADAVIST GADOBUTROL 1 MMOL/ML IV SOLN COMPARISON:  Correlation made with same day head CT FINDINGS: Brain: Relatively homogeneously enhancing dural-based lesion is present within the left  middle cranial fossa. Measures approximately 3.6 x 3.8 x 2.1 cm. There is some associated susceptibility medially likely reflecting mineralization. Dural enhancement is present along the adjacent tentorial incisura. No evidence of cavernous sinus or orbital apex involvement. Mild mass effect on the left temporal lobe but no apparent parenchymal edema. There is no acute infarction. No additional mass or abnormal enhancement. Ventricles and sulci are normal in size and configuration. Minimal patchy foci of T2 hyperintensity in the supratentorial white matter are nonspecific but may reflect minor chronic microvascular ischemic changes. Vascular: Major vessel flow voids at the skull base are preserved. Skull and upper cervical spine: Normal marrow signal is preserved. Sinuses/Orbits: Paranasal sinuses are aerated. Orbits are unremarkable. Other: Sella is unremarkable.  Mastoid air cells are clear. IMPRESSION: Dural-based lesion of the left middle cranial fossa most consistent  with Gale Klar meningioma. Mild displacement of the adjacent temporal lobe without parenchymal edema. Electronically Signed   By: Macy Mis M.D.   On: 10/09/2020 20:30        Scheduled Meds: . amLODipine  10 mg Oral Daily  . insulin aspart  0-15 Units Subcutaneous TID WC  . insulin aspart  0-5 Units Subcutaneous QHS  . insulin aspart  4 Units Subcutaneous TID WC  . insulin glargine  25 Units Subcutaneous QHS  . lisinopril  20 mg Oral Daily  . pravastatin  40 mg Oral q1800   Continuous Infusions: . sodium chloride       LOS: 0 days    Time spent: over 30 min    Fayrene Helper, MD [facility]   To contact the attending provider between 7A-7P or the covering provider during after hours 7P-7A, please log into the web site [email or web address] and access using universal [place] password for that web site. If you do not have the password, please call the hospital operator.  10/10/2020, 8:34 AM

## 2020-10-10 NOTE — Progress Notes (Signed)
EEG complete - results pending 

## 2020-10-10 NOTE — ED Notes (Signed)
States doesn't need anything now.  "  Going to MRI soon"

## 2020-10-11 ENCOUNTER — Inpatient Hospital Stay (HOSPITAL_COMMUNITY): Payer: Medicare Other | Admitting: Certified Registered"

## 2020-10-11 ENCOUNTER — Encounter (HOSPITAL_COMMUNITY): Payer: Self-pay | Admitting: Family Medicine

## 2020-10-11 ENCOUNTER — Inpatient Hospital Stay (HOSPITAL_COMMUNITY)
Admission: EM | Disposition: A | Discharge status: Home Health | Payer: Self-pay | Source: Home / Self Care | Attending: Family Medicine

## 2020-10-11 DIAGNOSIS — I160 Hypertensive urgency: Secondary | ICD-10-CM | POA: Diagnosis not present

## 2020-10-11 HISTORY — PX: CRANIOTOMY: SHX93

## 2020-10-11 HISTORY — PX: APPLICATION OF CRANIAL NAVIGATION: SHX6578

## 2020-10-11 LAB — POCT I-STAT, CHEM 8
BUN: 17 mg/dL (ref 8–23)
Calcium, Ion: 1.16 mmol/L (ref 1.15–1.40)
Chloride: 101 mmol/L (ref 98–111)
Creatinine, Ser: 0.6 mg/dL (ref 0.44–1.00)
Glucose, Bld: 267 mg/dL — ABNORMAL HIGH (ref 70–99)
HCT: 45 % (ref 36.0–46.0)
Hemoglobin: 15.3 g/dL — ABNORMAL HIGH (ref 12.0–15.0)
Potassium: 4.3 mmol/L (ref 3.5–5.1)
Sodium: 137 mmol/L (ref 135–145)
TCO2: 23 mmol/L (ref 22–32)

## 2020-10-11 LAB — GLUCOSE, CAPILLARY
Glucose-Capillary: 208 mg/dL — ABNORMAL HIGH (ref 70–99)
Glucose-Capillary: 217 mg/dL — ABNORMAL HIGH (ref 70–99)
Glucose-Capillary: 248 mg/dL — ABNORMAL HIGH (ref 70–99)
Glucose-Capillary: 248 mg/dL — ABNORMAL HIGH (ref 70–99)
Glucose-Capillary: 280 mg/dL — ABNORMAL HIGH (ref 70–99)
Glucose-Capillary: 283 mg/dL — ABNORMAL HIGH (ref 70–99)
Glucose-Capillary: 299 mg/dL — ABNORMAL HIGH (ref 70–99)

## 2020-10-11 LAB — CBC WITH DIFFERENTIAL/PLATELET
Abs Immature Granulocytes: 0.01 10*3/uL (ref 0.00–0.07)
Basophils Absolute: 0 10*3/uL (ref 0.0–0.1)
Basophils Relative: 0 %
Eosinophils Absolute: 0.1 10*3/uL (ref 0.0–0.5)
Eosinophils Relative: 2 %
HCT: 52.6 % — ABNORMAL HIGH (ref 36.0–46.0)
Hemoglobin: 17.3 g/dL — ABNORMAL HIGH (ref 12.0–15.0)
Immature Granulocytes: 0 %
Lymphocytes Relative: 20 %
Lymphs Abs: 1.2 10*3/uL (ref 0.7–4.0)
MCH: 28.9 pg (ref 26.0–34.0)
MCHC: 32.9 g/dL (ref 30.0–36.0)
MCV: 87.8 fL (ref 80.0–100.0)
Monocytes Absolute: 0.7 10*3/uL (ref 0.1–1.0)
Monocytes Relative: 11 %
Neutro Abs: 4 10*3/uL (ref 1.7–7.7)
Neutrophils Relative %: 67 %
Platelets: 224 10*3/uL (ref 150–400)
RBC: 5.99 MIL/uL — ABNORMAL HIGH (ref 3.87–5.11)
RDW: 11.9 % (ref 11.5–15.5)
WBC: 6 10*3/uL (ref 4.0–10.5)
nRBC: 0 % (ref 0.0–0.2)

## 2020-10-11 LAB — COMPREHENSIVE METABOLIC PANEL
ALT: 16 U/L (ref 0–44)
AST: 13 U/L — ABNORMAL LOW (ref 15–41)
Albumin: 3.3 g/dL — ABNORMAL LOW (ref 3.5–5.0)
Alkaline Phosphatase: 52 U/L (ref 38–126)
Anion gap: 9 (ref 5–15)
BUN: 16 mg/dL (ref 8–23)
CO2: 27 mmol/L (ref 22–32)
Calcium: 9.1 mg/dL (ref 8.9–10.3)
Chloride: 99 mmol/L (ref 98–111)
Creatinine, Ser: 0.88 mg/dL (ref 0.44–1.00)
GFR, Estimated: >60 mL/min (ref >60)
Glucose, Bld: 385 mg/dL — ABNORMAL HIGH (ref 70–99)
Potassium: 4.3 mmol/L (ref 3.5–5.1)
Sodium: 135 mmol/L (ref 135–145)
Total Bilirubin: 1.4 mg/dL — ABNORMAL HIGH (ref 0.3–1.2)
Total Protein: 6.5 g/dL (ref 6.5–8.1)

## 2020-10-11 LAB — POCT I-STAT 7, (LYTES, BLD GAS, ICA,H+H)
Acid-base deficit: 1 mmol/L (ref 0.0–2.0)
Bicarbonate: 24.3 mmol/L (ref 20.0–28.0)
Calcium, Ion: 1.13 mmol/L — ABNORMAL LOW (ref 1.15–1.40)
HCT: 45 % (ref 36.0–46.0)
Hemoglobin: 15.3 g/dL — ABNORMAL HIGH (ref 12.0–15.0)
O2 Saturation: 99 %
Patient temperature: 35.8
Potassium: 4.3 mmol/L (ref 3.5–5.1)
Sodium: 136 mmol/L (ref 135–145)
TCO2: 26 mmol/L (ref 22–32)
pCO2 arterial: 39.9 mmHg (ref 32.0–48.0)
pH, Arterial: 7.387 (ref 7.350–7.450)
pO2, Arterial: 129 mmHg — ABNORMAL HIGH (ref 83.0–108.0)

## 2020-10-11 LAB — MAGNESIUM: Magnesium: 1.8 mg/dL (ref 1.7–2.4)

## 2020-10-11 LAB — CBG MONITORING, ED: Glucose-Capillary: 350 mg/dL — ABNORMAL HIGH (ref 70–99)

## 2020-10-11 LAB — ERYTHROPOIETIN: Erythropoietin: 28.8 m[IU]/mL — ABNORMAL HIGH (ref 2.6–18.5)

## 2020-10-11 LAB — PHOSPHORUS: Phosphorus: 4.6 mg/dL (ref 2.5–4.6)

## 2020-10-11 LAB — PREPARE RBC (CROSSMATCH)

## 2020-10-11 LAB — ABO/RH: ABO/RH(D): O POS

## 2020-10-11 SURGERY — CRANIOTOMY TUMOR EXCISION
Anesthesia: General | Site: Head

## 2020-10-11 MED ORDER — LABETALOL HCL 5 MG/ML IV SOLN
10.0000 mg | INTRAVENOUS | Status: DC | PRN
  Administered 2020-10-11: 10 mg via INTRAVENOUS
  Filled 2020-10-11: qty 4

## 2020-10-11 MED ORDER — VANCOMYCIN HCL 1000 MG IV SOLR
INTRAVENOUS | Status: DC | PRN
  Administered 2020-10-11: 1500 mg via INTRAVENOUS

## 2020-10-11 MED ORDER — HYDRALAZINE HCL 20 MG/ML IJ SOLN: 5.0000 mg | Freq: Once | INTRAMUSCULAR | Status: AC

## 2020-10-11 MED ORDER — ENALAPRILAT 1.25 MG/ML IV SOLN: 1.2500 mg | Freq: Four times a day (QID) | INTRAVENOUS | Status: DC | PRN

## 2020-10-11 MED ORDER — HEMOSTATIC AGENTS (NO CHARGE) OPTIME
TOPICAL | Status: DC | PRN
  Administered 2020-10-11: 1 via TOPICAL

## 2020-10-11 MED ORDER — ONDANSETRON HCL 4 MG/2ML IJ SOLN
INTRAMUSCULAR | Status: DC | PRN
  Administered 2020-10-11: 4 mg via INTRAVENOUS

## 2020-10-11 MED ORDER — DOCUSATE SODIUM 100 MG PO CAPS
100.0000 mg | ORAL_CAPSULE | Freq: Two times a day (BID) | ORAL | Status: DC
  Administered 2020-10-11 – 2020-10-20 (×18): 100 mg via ORAL
  Filled 2020-10-11 (×18): qty 1

## 2020-10-11 MED ORDER — BACITRACIN ZINC 500 UNIT/GM EX OINT
TOPICAL_OINTMENT | CUTANEOUS | Status: AC
  Filled 2020-10-11: qty 28.35

## 2020-10-11 MED ORDER — THROMBIN 5000 UNITS EX SOLR: OROMUCOSAL | Status: DC | PRN

## 2020-10-11 MED ORDER — LABETALOL HCL 5 MG/ML IV SOLN
INTRAVENOUS | Status: DC | PRN
  Administered 2020-10-11: 5 mg via INTRAVENOUS
  Administered 2020-10-11: 10 mg via INTRAVENOUS
  Administered 2020-10-11: 5 mg via INTRAVENOUS

## 2020-10-11 MED ORDER — OXYCODONE HCL 5 MG/5ML PO SOLN: 5.0000 mg | Freq: Once | ORAL | Status: DC | PRN

## 2020-10-11 MED ORDER — THROMBIN 20000 UNITS EX SOLR: CUTANEOUS | Status: DC | PRN

## 2020-10-11 MED ORDER — ROCURONIUM BROMIDE 10 MG/ML (PF) SYRINGE
PREFILLED_SYRINGE | INTRAVENOUS | Status: AC
  Filled 2020-10-11: qty 10

## 2020-10-11 MED ORDER — DEXAMETHASONE 0.5 MG PO TABS: 1.0000 mg | ORAL_TABLET | Freq: Two times a day (BID) | ORAL | Status: DC

## 2020-10-11 MED ORDER — PROPOFOL 10 MG/ML IV BOLUS
INTRAVENOUS | Status: DC | PRN
  Administered 2020-10-11: 100 mg via INTRAVENOUS
  Administered 2020-10-11: 20 mg via INTRAVENOUS
  Administered 2020-10-11: 10 mg via INTRAVENOUS
  Administered 2020-10-11: 40 mg via INTRAVENOUS

## 2020-10-11 MED ORDER — PHENYLEPHRINE HCL-NACL 10-0.9 MG/250ML-% IV SOLN
INTRAVENOUS | Status: DC | PRN
  Administered 2020-10-11: 100 ug/min via INTRAVENOUS

## 2020-10-11 MED ORDER — DEXAMETHASONE 4 MG PO TABS: 2.0000 mg | ORAL_TABLET | Freq: Three times a day (TID) | ORAL | Status: DC

## 2020-10-11 MED ORDER — POLYETHYLENE GLYCOL 3350 17 G PO PACK
17.0000 g | PACK | Freq: Every day | ORAL | Status: DC | PRN
  Filled 2020-10-11: qty 1

## 2020-10-11 MED ORDER — CHLORHEXIDINE GLUCONATE CLOTH 2 % EX PADS: 6.0000 | MEDICATED_PAD | Freq: Once | CUTANEOUS | Status: DC

## 2020-10-11 MED ORDER — BACITRACIN ZINC 500 UNIT/GM EX OINT
TOPICAL_OINTMENT | CUTANEOUS | Status: DC | PRN
  Administered 2020-10-11: 1 via TOPICAL

## 2020-10-11 MED ORDER — CHLORHEXIDINE GLUCONATE CLOTH 2 % EX PADS
6.0000 | MEDICATED_PAD | Freq: Every day | CUTANEOUS | Status: DC
  Administered 2020-10-11 – 2020-10-18 (×8): 6 via TOPICAL

## 2020-10-11 MED ORDER — THROMBIN (RECOMBINANT) 5000 UNITS EX SOLR
CUTANEOUS | Status: AC
  Filled 2020-10-11: qty 5000

## 2020-10-11 MED ORDER — LIDOCAINE 2% (20 MG/ML) 5 ML SYRINGE
INTRAMUSCULAR | Status: AC
  Filled 2020-10-11: qty 5

## 2020-10-11 MED ORDER — HYDROCODONE-ACETAMINOPHEN 5-325 MG PO TABS
1.0000 | ORAL_TABLET | ORAL | Status: DC | PRN
  Administered 2020-10-12 – 2020-10-20 (×7): 1 via ORAL
  Filled 2020-10-11 (×6): qty 1

## 2020-10-11 MED ORDER — ROCURONIUM BROMIDE 10 MG/ML (PF) SYRINGE
PREFILLED_SYRINGE | INTRAVENOUS | Status: DC | PRN
  Administered 2020-10-11: 70 mg via INTRAVENOUS
  Administered 2020-10-11 (×3): 10 mg via INTRAVENOUS

## 2020-10-11 MED ORDER — THROMBIN (RECOMBINANT) 5000 UNITS EX SOLR
CUTANEOUS | Status: AC
  Filled 2020-10-11: qty 10000

## 2020-10-11 MED ORDER — LABETALOL HCL 5 MG/ML IV SOLN
INTRAVENOUS | Status: AC
  Filled 2020-10-11: qty 4

## 2020-10-11 MED ORDER — HEPARIN SODIUM (PORCINE) 5000 UNIT/ML IJ SOLN
5000.0000 [IU] | Freq: Three times a day (TID) | INTRAMUSCULAR | Status: DC
  Administered 2020-10-13 – 2020-10-15 (×7): 5000 [IU] via SUBCUTANEOUS
  Filled 2020-10-11 (×8): qty 1

## 2020-10-11 MED ORDER — MORPHINE SULFATE (PF) 2 MG/ML IV SOLN
1.0000 mg | INTRAVENOUS | Status: DC | PRN
  Administered 2020-10-12: 1 mg via INTRAVENOUS
  Filled 2020-10-11 (×2): qty 1

## 2020-10-11 MED ORDER — ORAL CARE MOUTH RINSE: 15.0000 mL | Freq: Once | OROMUCOSAL | Status: DC

## 2020-10-11 MED ORDER — ACETAMINOPHEN 160 MG/5ML PO SOLN: 1000.0000 mg | Freq: Once | ORAL | Status: DC | PRN

## 2020-10-11 MED ORDER — ONDANSETRON HCL 4 MG PO TABS
4.0000 mg | ORAL_TABLET | ORAL | Status: DC | PRN
  Administered 2020-10-17: 4 mg via ORAL
  Filled 2020-10-11: qty 1

## 2020-10-11 MED ORDER — DEXAMETHASONE 4 MG PO TABS
4.0000 mg | ORAL_TABLET | Freq: Three times a day (TID) | ORAL | Status: AC
  Administered 2020-10-14 – 2020-10-15 (×6): 4 mg via ORAL
  Filled 2020-10-11 (×4): qty 1

## 2020-10-11 MED ORDER — POTASSIUM CHLORIDE IN NACL 20-0.9 MEQ/L-% IV SOLN
INTRAVENOUS | Status: DC
  Filled 2020-10-11 (×3): qty 1000

## 2020-10-11 MED ORDER — ONDANSETRON HCL 4 MG/2ML IJ SOLN
INTRAMUSCULAR | Status: AC
  Filled 2020-10-11: qty 2

## 2020-10-11 MED ORDER — ACETAMINOPHEN 10 MG/ML IV SOLN: 1000.0000 mg | Freq: Once | INTRAVENOUS | Status: DC | PRN

## 2020-10-11 MED ORDER — SUFENTANIL CITRATE 50 MCG/ML IV SOLN
INTRAVENOUS | Status: AC
  Filled 2020-10-11: qty 1

## 2020-10-11 MED ORDER — INSULIN ASPART 100 UNIT/ML ~~LOC~~ SOLN
0.0000 [IU] | SUBCUTANEOUS | Status: DC
  Administered 2020-10-11: 11 [IU] via SUBCUTANEOUS
  Administered 2020-10-12: 10 [IU] via SUBCUTANEOUS
  Administered 2020-10-12: 3 [IU] via SUBCUTANEOUS
  Administered 2020-10-12: 5 [IU] via SUBCUTANEOUS

## 2020-10-11 MED ORDER — DEXAMETHASONE 0.5 MG PO TABS: 1.0000 mg | ORAL_TABLET | Freq: Every day | ORAL | Status: DC

## 2020-10-11 MED ORDER — SODIUM CHLORIDE 0.9% IV SOLUTION: Freq: Once | INTRAVENOUS | Status: DC

## 2020-10-11 MED ORDER — INSULIN REGULAR(HUMAN) IN NACL 100-0.9 UT/100ML-% IV SOLN
INTRAVENOUS | Status: AC
  Administered 2020-10-11: 20:00:00 21 [IU]/h via INTRAVENOUS
  Filled 2020-10-11 (×2): qty 100

## 2020-10-11 MED ORDER — PROPOFOL 10 MG/ML IV BOLUS
INTRAVENOUS | Status: AC
  Filled 2020-10-11: qty 20

## 2020-10-11 MED ORDER — INSULIN ASPART 100 UNIT/ML ~~LOC~~ SOLN: 0.0000 [IU] | Freq: Three times a day (TID) | SUBCUTANEOUS | Status: DC

## 2020-10-11 MED ORDER — DEXAMETHASONE SODIUM PHOSPHATE 10 MG/ML IJ SOLN
INTRAMUSCULAR | Status: DC | PRN
  Administered 2020-10-11: 10 mg via INTRAVENOUS

## 2020-10-11 MED ORDER — DEXAMETHASONE SODIUM PHOSPHATE 10 MG/ML IJ SOLN
INTRAMUSCULAR | Status: AC
  Filled 2020-10-11: qty 1

## 2020-10-11 MED ORDER — MANNITOL 20 % IV SOLN: INTRAVENOUS | Status: DC | PRN

## 2020-10-11 MED ORDER — CHLORHEXIDINE GLUCONATE 0.12 % MT SOLN: 15.0000 mL | Freq: Once | OROMUCOSAL | Status: DC

## 2020-10-11 MED ORDER — INSULIN ASPART 100 UNIT/ML ~~LOC~~ SOLN: 0.0000 [IU] | Freq: Every day | SUBCUTANEOUS | Status: DC

## 2020-10-11 MED ORDER — SUFENTANIL CITRATE 50 MCG/ML IV SOLN
INTRAVENOUS | Status: DC | PRN
  Administered 2020-10-11: 5 ug via INTRAVENOUS
  Administered 2020-10-11: 10 ug via INTRAVENOUS
  Administered 2020-10-11: 5 ug via INTRAVENOUS
  Administered 2020-10-11: 15 ug via INTRAVENOUS

## 2020-10-11 MED ORDER — HYDRALAZINE HCL 20 MG/ML IJ SOLN
INTRAMUSCULAR | Status: AC
  Administered 2020-10-11: 5 mg
  Filled 2020-10-11: qty 1

## 2020-10-11 MED ORDER — SODIUM CHLORIDE 0.9 % IV SOLN: INTRAVENOUS | Status: DC | PRN

## 2020-10-11 MED ORDER — DEXAMETHASONE 4 MG PO TABS: 2.0000 mg | ORAL_TABLET | Freq: Two times a day (BID) | ORAL | Status: DC

## 2020-10-11 MED ORDER — ACETAMINOPHEN 500 MG PO TABS: 1000.0000 mg | ORAL_TABLET | Freq: Once | ORAL | Status: DC | PRN

## 2020-10-11 MED ORDER — DEXAMETHASONE 4 MG PO TABS
4.0000 mg | ORAL_TABLET | Freq: Four times a day (QID) | ORAL | Status: AC
  Administered 2020-10-12 – 2020-10-13 (×8): 4 mg via ORAL
  Filled 2020-10-11 (×8): qty 1

## 2020-10-11 MED ORDER — LIDOCAINE-EPINEPHRINE 1 %-1:100000 IJ SOLN
INTRAMUSCULAR | Status: AC
  Filled 2020-10-11: qty 1

## 2020-10-11 MED ORDER — ACETAMINOPHEN 650 MG RE SUPP: 650.0000 mg | RECTAL | Status: DC | PRN

## 2020-10-11 MED ORDER — DEXTROSE 50 % IV SOLN: 0.0000 mL | INTRAVENOUS | Status: DC | PRN

## 2020-10-11 MED ORDER — MANNITOL 20 % IV SOLN
INTRAVENOUS | Status: AC
  Filled 2020-10-11: qty 500

## 2020-10-11 MED ORDER — ACETAMINOPHEN 325 MG PO TABS
650.0000 mg | ORAL_TABLET | ORAL | Status: DC | PRN
  Administered 2020-10-12 – 2020-10-17 (×6): 650 mg via ORAL
  Filled 2020-10-11 (×7): qty 2

## 2020-10-11 MED ORDER — LIDOCAINE 2% (20 MG/ML) 5 ML SYRINGE
INTRAMUSCULAR | Status: DC | PRN
  Administered 2020-10-11: 60 mg via INTRAVENOUS

## 2020-10-11 MED ORDER — ONDANSETRON HCL 4 MG/2ML IJ SOLN
4.0000 mg | INTRAMUSCULAR | Status: DC | PRN
  Administered 2020-10-12 – 2020-10-16 (×2): 4 mg via INTRAVENOUS
  Filled 2020-10-11 (×2): qty 2

## 2020-10-11 MED ORDER — LIDOCAINE-EPINEPHRINE 1 %-1:100000 IJ SOLN
INTRAMUSCULAR | Status: DC | PRN
  Administered 2020-10-11: 10 mL

## 2020-10-11 MED ORDER — NICARDIPINE HCL IN NACL 20-0.86 MG/200ML-% IV SOLN
3.0000 mg/h | INTRAVENOUS | Status: DC
  Administered 2020-10-11 – 2020-10-12 (×2): 5 mg/h via INTRAVENOUS
  Administered 2020-10-12 (×4): 10 mg/h via INTRAVENOUS
  Filled 2020-10-11 (×6): qty 200

## 2020-10-11 MED ORDER — THROMBIN 20000 UNITS EX SOLR
CUTANEOUS | Status: AC
  Filled 2020-10-11: qty 20000

## 2020-10-11 MED ORDER — PANTOPRAZOLE SODIUM 40 MG PO TBEC
40.0000 mg | DELAYED_RELEASE_TABLET | Freq: Every day | ORAL | Status: DC
  Administered 2020-10-11 – 2020-10-20 (×10): 40 mg via ORAL
  Filled 2020-10-11 (×10): qty 1

## 2020-10-11 MED ORDER — OXYCODONE HCL 5 MG PO TABS: 5.0000 mg | ORAL_TABLET | Freq: Once | ORAL | Status: DC | PRN

## 2020-10-11 MED ORDER — LACTATED RINGERS IV SOLN: INTRAVENOUS | Status: DC

## 2020-10-11 MED ORDER — CHLORHEXIDINE GLUCONATE 0.12 % MT SOLN
OROMUCOSAL | Status: AC
  Filled 2020-10-11: qty 15

## 2020-10-11 MED ORDER — FLEET ENEMA 7-19 GM/118ML RE ENEM: 1.0000 | ENEMA | Freq: Once | RECTAL | Status: DC | PRN

## 2020-10-11 MED ORDER — FENTANYL CITRATE (PF) 100 MCG/2ML IJ SOLN: 25.0000 ug | INTRAMUSCULAR | Status: DC | PRN

## 2020-10-11 MED ORDER — SUGAMMADEX SODIUM 200 MG/2ML IV SOLN
INTRAVENOUS | Status: DC | PRN
  Administered 2020-10-11: 200 mg via INTRAVENOUS

## 2020-10-11 MED ORDER — PROMETHAZINE HCL 25 MG PO TABS: 12.5000 mg | ORAL_TABLET | ORAL | Status: DC | PRN

## 2020-10-11 MED ORDER — VANCOMYCIN HCL 750 MG/150ML IV SOLN
750.0000 mg | Freq: Two times a day (BID) | INTRAVENOUS | Status: AC
  Administered 2020-10-12 – 2020-10-13 (×3): 750 mg via INTRAVENOUS
  Filled 2020-10-11 (×3): qty 150

## 2020-10-11 MED ORDER — FUROSEMIDE 10 MG/ML IJ SOLN
INTRAMUSCULAR | Status: DC | PRN
  Administered 2020-10-11: 10 mg via INTRAMUSCULAR

## 2020-10-11 MED ORDER — ALBUMIN HUMAN 5 % IV SOLN: INTRAVENOUS | Status: DC | PRN

## 2020-10-11 SURGICAL SUPPLY — 62 items
BAND INSRT 18 STRL LF DISP RB (MISCELLANEOUS) ×2
BAND RUBBER #18 3X1/16 STRL (MISCELLANEOUS) ×6 IMPLANT
BLADE CLIPPER SURG (BLADE) ×3 IMPLANT
BUR ACORN 9.0 PRECISION (BURR) ×2 IMPLANT
BUR ACORN 9.0MM PRECISION (BURR) ×1
BUR SPIRAL ROUTER 2.3 (BUR) ×2 IMPLANT
BUR SPIRAL ROUTER 2.3MM (BUR) ×1
CANISTER SUCT 3000ML PPV (MISCELLANEOUS) ×6 IMPLANT
CNTNR URN SCR LID CUP LEK RST (MISCELLANEOUS) ×1 IMPLANT
CONT SPEC 4OZ STRL OR WHT (MISCELLANEOUS) ×3
COVER BURR HOLE UNIV 10 (Orthopedic Implant) ×9 IMPLANT
DECANTER SPIKE VIAL GLASS SM (MISCELLANEOUS) ×3 IMPLANT
DRAPE HALF SHEET 40X57 (DRAPES) ×3 IMPLANT
DRAPE MICROSCOPE LEICA (MISCELLANEOUS) ×3 IMPLANT
DRAPE NEUROLOGICAL W/INCISE (DRAPES) ×3 IMPLANT
DRAPE WARM FLUID 44X44 (DRAPES) ×3 IMPLANT
DRSG AQUACEL AG ADV 3.5X 6 (GAUZE/BANDAGES/DRESSINGS) ×6 IMPLANT
DURAPREP 6ML APPLICATOR 50/CS (WOUND CARE) ×3 IMPLANT
ELECT COATED BLADE 2.86 ST (ELECTRODE) ×3 IMPLANT
ELECT REM PT RETURN 9FT ADLT (ELECTROSURGICAL) ×3
ELECTRODE REM PT RTRN 9FT ADLT (ELECTROSURGICAL) ×1 IMPLANT
FORCEPS BIPO MALIS IRRIG 9X1.5 (NEUROSURGERY SUPPLIES) ×3 IMPLANT
GAUZE XEROFORM 1X8 LF (GAUZE/BANDAGES/DRESSINGS) ×3 IMPLANT
GLOVE BIO SURGEON STRL SZ7.5 (GLOVE) ×6 IMPLANT
GLOVE BIOGEL PI IND STRL 7.5 (GLOVE) ×4 IMPLANT
GLOVE BIOGEL PI INDICATOR 7.5 (GLOVE) ×8
GLOVE ECLIPSE 7.5 STRL STRAW (GLOVE) ×6 IMPLANT
GOWN STRL REUS W/ TWL LRG LVL3 (GOWN DISPOSABLE) IMPLANT
GOWN STRL REUS W/ TWL XL LVL3 (GOWN DISPOSABLE) ×2 IMPLANT
GOWN STRL REUS W/TWL 2XL LVL3 (GOWN DISPOSABLE) ×6 IMPLANT
GOWN STRL REUS W/TWL LRG LVL3 (GOWN DISPOSABLE)
GOWN STRL REUS W/TWL XL LVL3 (GOWN DISPOSABLE) ×6
GRAFT DURAGEN MATRIX 3WX3L (Graft) ×3 IMPLANT
GRAFT DURAGEN MATRIX 3X3 SNGL (Graft) ×1 IMPLANT
HEMOSTAT POWDER KIT SURGIFOAM (HEMOSTASIS) ×6 IMPLANT
HEMOSTAT SURGICEL 2X14 (HEMOSTASIS) ×3 IMPLANT
HOOK DURA 1/2IN (MISCELLANEOUS) ×3 IMPLANT
IV NS 1000ML (IV SOLUTION) ×3
IV NS 1000ML BAXH (IV SOLUTION) ×1 IMPLANT
KIT BASIN OR (CUSTOM PROCEDURE TRAY) ×3 IMPLANT
KIT TURNOVER KIT B (KITS) ×3 IMPLANT
MARKER SPHERE PSV REFLC 13MM (MARKER) ×6 IMPLANT
NEEDLE HYPO 22GX1.5 SAFETY (NEEDLE) ×3 IMPLANT
NS IRRIG 1000ML POUR BTL (IV SOLUTION) ×9 IMPLANT
PACK CRANIOTOMY CUSTOM (CUSTOM PROCEDURE TRAY) ×3 IMPLANT
PIN MAYFIELD SKULL DISP (PIN) ×3 IMPLANT
SCREW UNIII AXS SD 1.5X4 (Screw) ×39 IMPLANT
SET TUBING IRRIGATION DISP (TUBING) ×3 IMPLANT
SPONGE NEURO XRAY DETECT 1X3 (DISPOSABLE) IMPLANT
SPONGE SURGIFOAM ABS GEL 100 (HEMOSTASIS) ×3 IMPLANT
STAPLER VISISTAT 35W (STAPLE) ×6 IMPLANT
SUT ETHILON 3 0 FSL (SUTURE) ×3 IMPLANT
SUT ETHILON 3 0 PS 1 (SUTURE) IMPLANT
SUT MNCRL AB 3-0 PS2 18 (SUTURE) ×3 IMPLANT
SUT NURALON 4 0 TR CR/8 (SUTURE) ×3 IMPLANT
SUT VIC AB 2-0 CP2 18 (SUTURE) ×3 IMPLANT
TOWEL GREEN STERILE (TOWEL DISPOSABLE) ×3 IMPLANT
TOWEL GREEN STERILE FF (TOWEL DISPOSABLE) ×3 IMPLANT
TRAY FOLEY MTR SLVR 16FR STAT (SET/KITS/TRAYS/PACK) ×3 IMPLANT
TUBE CONNECTING 12'X1/4 (SUCTIONS) ×1
TUBE CONNECTING 12X1/4 (SUCTIONS) ×2 IMPLANT
WATER STERILE IRR 1000ML POUR (IV SOLUTION) ×3 IMPLANT

## 2020-10-11 NOTE — Op Note (Signed)
Regina Mckay female 70 y.o. 10/11/2020  Procedure(s) and Anesthesia Type:    * Left temporal CRANIOTOMY FOR RESECTION OF middle fossa MENINGIOMA - General    * APPLICATION OF CRANIAL NAVIGATION - General     Use of microscope for microdissection  Surgeon(s) and Role:    Marcello Moores, Dorcas Carrow, MD - Primary    Ashok Pall, MD - Assisting   Indications: This is a 70 year old woman who presented to the hospital after having numerous episodes of speech arrest and right-sided weakness over the past several weeks that would fully resolve after several minutes.  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  I had a long discussion with the patient regarding treatment options.  Given the significant size of the meningioma and its mass-effect on the temporal lobe, I did recommend surgical resection.  Risks, benefits, alternatives, and expected convalescence were discussed.  Risks discussed included, but were not limited to, bleeding, pain, infection, seizure, scar, stroke, recurrence, cerebrospinal fluid leak, neurologic deficit, coma, and death.  Informed consent was obtained and she wished to proceed.        Surgeon: [name]   Assistants: Ashok Pall, MD. please note there were no qualified trainees available to assist with the procedure.  Assistance was required for assistance with retraction of the brain.  Anesthesia: General endotracheal anesthesia   Procedure Detail  CRANIOTOMY FOR RESECTION OF MENINGIOMA, APPLICATION OF CRANIAL NAVIGATION  The patient was brought to the operating room.  After appropriate lines and monitors were placed, general anesthesia was induced and patient was intubated by the anesthesia service.  Patient was positioned supine with head turned to the right with all pressure points padded and eyes protected.  The head was  placed in a Mayfield head clamp and affixed the bed.  A preoperative thin cut MRI was used to create a 3D image, allowing for surface match registration and cranial neuro navigation throughout the case.  The scalp was clipped, preprepped with alcohol prepped and draped in sterile fashion.  A timeout was performed.  Preoperative antibiotics, dexamethasone, and mannitol were given.  The skin and the underlying temporalis muscle was incised and a question mark shaped temporal craniotomy myocutaneous flap was raised and held in place with fishhooks.  The root of the zygoma was exposed in order to allow for a subtemporal approach to the tumor.  Bur holes were placed at the frontal keyhole, above the root of the zygoma, and one more posteriorly and were used to dissect the dura from the inner table of the skull.  Craniotome was used to complete the craniotomy.  A large middle meningeal artery was coagulated.  Meticulous epidural hemostasis was obtained.  The dura was opened in C-shaped manner and flapped forward, with tack up stitches placed along the inferior dural flaps to allow for subtemporal approach.  The craniotome was flushed with the floor of the middle fossa and the dural tail was seen along with fossa floor.  Fortunately, the temporal lobe was fairly relaxed and slight gentle retraction of the temporal lobe allowed exposure of the lateral surface of the mass.  The mass was then devascularized and debulked, detaching it from its middle fossa dura and coagulating the small feeders.  The tumor was then debulked internally and was fairly soft in consistency, though the more medial portion was more firm and almost  calcified.  Circumferential dissection of the arachnoid plane around the tumor from the temporal lobe was performed.  The bulk of the tumor was then removed.  The microscope was then introduced into the field to allow for microdissection of the remaining tumor.  The remaining tumor was able to be peeled  from the lateral cavernous sinus wall.  The tentorial incisura was inspected.  There was small amount of tumor emanating from the tentorial edge.  Some of this was able to be resected, though there was a tiny component that appeared to be stuck to the 4th nerve as well as to a large draining vein from the base of the temporal lobe into the medial dura.  This tiny remnant tumor was coagulated.  The middle fossa floor attachment was then bipolared thoroughly. in the end, a Simpson grade 2 resection was achieved.  Meticulous hemostasis was obtained.  The dura was then closed with 4-0 Nurolon stitches.  However, as the dura was fairly thready and was not holding stitches well, a DuraGen plus onlay was then placed.  The cranial flap was replaced with Stryker cranial plating system.  A medium Hemovac subgaleal drain was placed and tunneled out the skin and secured with a stitch.  The wound was irrigated thoroughly.  The temporalis muscle was closed with 2-0 Vicryl stitches.  The galeal layer was closed 2-0 Vicryl stitches.  The skin was closed with staples.  Sterile dressing was then placed.  Patient was removed from Mayfield head holder and extubated by the anesthesia service.  All counts were correct at the end of surgery.   Findings: Large middle fossa meningioma with involvement of lateral cavernous sinus wall and tentorial edge  Estimated Blood Loss:  150 ml         Drains: HEMOVAC subgaleal drain               Specimens: Left middle fossa mass         Implants: Stryker cranial plating system        Complications:  * No complications entered in OR log *         Disposition: ICU - extubated and stable.         Condition: stable

## 2020-10-11 NOTE — Anesthesia Preprocedure Evaluation (Addendum)
Anesthesia Evaluation  Patient identified by MRN, date of birth, ID band Patient awake    Reviewed: Allergy & Precautions, NPO status , Patient's Chart, lab work & pertinent test results  History of Anesthesia Complications Negative for: history of anesthetic complications  Airway Mallampati: II  TM Distance: >3 FB Neck ROM: Full    Dental  (+) Dental Advisory Given   Pulmonary neg pulmonary ROS, neg shortness of breath, neg sleep apnea, neg COPD, neg recent URI,  Covid-19 Nucleic Acid Test Results Lab Results      Component                Value               Date                      SARSCOV2NAA              NEGATIVE            10/09/2020              breath sounds clear to auscultation       Cardiovascular hypertension, Pt. on medications (-) angina(-) Past MI and (-) CHF  Rhythm:Regular     Neuro/Psych MENINGIOMA negative psych ROS   GI/Hepatic negative GI ROS, Neg liver ROS,   Endo/Other  diabetes, Poorly ControlledMorbid obesityLab Results      Component                Value               Date                      HGBA1C                   13.6 (H)            10/10/2020             Renal/GU negative Renal ROSLab Results      Component                Value               Date                      CREATININE               0.88                10/11/2020                Musculoskeletal   Abdominal   Peds  Hematology negative hematology ROS (+) Lab Results      Component                Value               Date                      WBC                      6.0                 10/11/2020                HGB  17.3 (H)            10/11/2020                HCT                      52.6 (H)            10/11/2020                MCV                      87.8                10/11/2020                PLT                      224                 10/11/2020              Anesthesia Other Findings    Reproductive/Obstetrics                            Anesthesia Physical Anesthesia Plan  ASA: III  Anesthesia Plan: General   Post-op Pain Management:    Induction: Intravenous  PONV Risk Score and Plan: 3 and Ondansetron  Airway Management Planned: Oral ETT  Additional Equipment: Arterial line, CVP and Ultrasound Guidance Line Placement  Intra-op Plan:   Post-operative Plan: Extubation in OR  Informed Consent: I have reviewed the patients History and Physical, chart, labs and discussed the procedure including the risks, benefits and alternatives for the proposed anesthesia with the patient or authorized representative who has indicated his/her understanding and acceptance.     Dental advisory given  Plan Discussed with: CRNA and Surgeon  Anesthesia Plan Comments:        Anesthesia Quick Evaluation

## 2020-10-11 NOTE — Progress Notes (Signed)
Inpatient Diabetes Program Recommendations  AACE/ADA: New Consensus Statement on Inpatient Glycemic Control (2015)  Target Ranges:  Prepandial:   less than 140 mg/dL      Peak postprandial:   less than 180 mg/dL (1-2 hours)      Critically ill patients:  140 - 180 mg/dL   Lab Results  Component Value Date   GLUCAP 350 (H) 10/11/2020   HGBA1C 13.6 (H) 10/10/2020    Review of Glycemic Control  Diabetes history: DM2 Outpatient Diabetes medications: Glucovance 5-500 mg TID, Basaglar 50 units  Current orders for Inpatient glycemic control: Lantus 25 units, Novolog 0-15 units TID + HS  Inpatient Diabetes Program Recommendations:     Consider increasing Lantus to 30 units QD Novolog 0-20 units Q4H while NPO.  Will follow.  Thank you. Lorenda Peck, RD, LDN, CDE Inpatient Diabetes Coordinator [phone]

## 2020-10-11 NOTE — Progress Notes (Signed)
Neurosurgery  Pt remains neurologically stable.  Risks, benefits, alternatives, and expected convalescence were discussed.  All questions answered.  She wishes to proceed with surgery.

## 2020-10-11 NOTE — Progress Notes (Addendum)
Pharmacy Antibiotic Note  Regina Mckay is a 70 y.o. female admitted on 10/09/2020 with right hand weakness and speech difficulty, found to have meningioma with mass.  Now s/p left temporal craniotomy for resection of middle fossa.  Pharmacy has been consulted for vancomycin dosing x36 hours for surgical prophylaxis.  Patient received vancomycin 1gm IV around 1500 today.  PCN allergy.  SCr 0.6, CrCL 77 ml/min  Plan: Vancomycin 750mg  IV Q12H x 3 doses, start tomorrow F/U with drain removal and the need to continue vanc post the 36 hours  Height: 5\' 4"  (162.6 cm) Weight: 108 kg (238 lb) IBW/kg (Calculated) : 54.7  Temp (24hrs), Avg:97.9 F (36.6 C), Min:97.4 F (36.3 C), Max:98.5 F (36.9 C)  Recent Labs  Lab 10/09/20 1337 10/10/20 0443 10/11/20 0603 10/11/20 1833  WBC 7.8 6.1 6.0  --   CREATININE 0.89 0.80 0.88 0.60    Estimated Creatinine Clearance: 77.4 mL/min (by C-G formula based on SCr of 0.6 mg/dL).    Allergies  Allergen Reactions  . Amoxicillin Shortness Of Breath  . Penicillins Shortness Of Breath    Did it involve swelling of the face/tongue/throat, SOB, or low BP? Yes Did it involve sudden or severe rash/hives, skin peeling, or any reaction on the inside of your mouth or nose? No Did you need to seek medical attention at a hospital or doctor's office? Yes When did it last happen? If all above answers are "NO", may proceed with cephalosporin use.     Regina Mckay, PharmD, BCPS, [place] 10/11/2020, 8:56 PM

## 2020-10-11 NOTE — Transfer of Care (Signed)
Immediate Anesthesia Transfer of Care Note  Patient: Regina Mckay  Procedure(s) Performed: CRANIOTOMY FOR RESECTION OF MENINGIOMA (N/A Head) APPLICATION OF CRANIAL NAVIGATION (N/A Head)  Patient Location: PACU  Anesthesia Type:General  Level of Consciousness: drowsy and patient cooperative  Airway & Oxygen Therapy: Patient Spontanous Breathing and Patient connected to nasal cannula oxygen  Post-op Assessment: Report given to RN, Post -op Vital signs reviewed and stable and Patient moving all extremities  Post vital signs: Reviewed and stable  Last Vitals:  Vitals Value Taken Time  BP 174/76 10/11/20 1857  Temp    Pulse 71 10/11/20 1901  Resp 17 10/11/20 1901  SpO2 98 % 10/11/20 1901  Vitals shown include unvalidated device data.  Last Pain:  Vitals:   10/11/20 1136  TempSrc: Oral  PainSc:          Complications: No complications documented.

## 2020-10-11 NOTE — Anesthesia Procedure Notes (Signed)
Arterial Line Insertion Start/End3/05/2021 2:50 PM, 10/11/2020 3:00 PM Performed by: Moshe Salisbury, CRNA, CRNA  Patient location: Pre-op. Preanesthetic checklist: patient identified, IV checked, site marked, risks and benefits discussed, surgical consent, monitors and equipment checked, pre-op evaluation, timeout performed and anesthesia consent Lidocaine 1% used for infiltration Right, radial was placed Catheter size: 20 G Hand hygiene performed , maximum sterile barriers used  and Seldinger technique used  Attempts: 1 Procedure performed without using ultrasound guided technique. Following insertion, dressing applied and Biopatch. Post procedure assessment: normal  Patient tolerated the procedure well with no immediate complications.

## 2020-10-11 NOTE — Anesthesia Postprocedure Evaluation (Signed)
Anesthesia Post Note  Patient: [name]  Procedure(s) Performed: CRANIOTOMY FOR RESECTION OF MENINGIOMA (N/A Head) APPLICATION OF CRANIAL NAVIGATION (N/A Head)     Patient location during evaluation: PACU Anesthesia Type: General Level of consciousness: awake and alert Pain management: pain level controlled Vital Signs Assessment: post-procedure vital signs reviewed and stable Respiratory status: spontaneous breathing, nonlabored ventilation, respiratory function stable and patient connected to nasal cannula oxygen Cardiovascular status: blood pressure returned to baseline and stable Postop Assessment: no apparent nausea or vomiting Anesthetic complications: no   No complications documented.  Last Vitals:  Vitals:   10/11/20 2026 10/11/20 2045  BP: (!) 157/73 138/63  Pulse: 83 84  Resp: 20 (!) 0  Temp:    SpO2: 94% 94%    Last Pain:  Vitals:   10/11/20 2000  TempSrc:   PainSc: Asleep    LLE Motor Response: Purposeful movement (10/11/20 2100)   RLE Motor Response: Purposeful movement (10/11/20 2100)        Regina Mckay

## 2020-10-11 NOTE — Anesthesia Procedure Notes (Signed)
Procedure Name: Intubation Date/Time: 10/11/2020 3:26 PM Performed by: Moshe Salisbury, CRNA Pre-anesthesia Checklist: Patient identified, Emergency Drugs available, Suction available and Patient being monitored Patient Re-evaluated:Patient Re-evaluated prior to induction Oxygen Delivery Method: Circle System Utilized Preoxygenation: Pre-oxygenation with 100% oxygen Induction Type: IV induction Ventilation: Mask ventilation without difficulty Laryngoscope Size: Mac and 3 Grade View: Grade II Tube type: Oral Tube size: 7.5 mm Number of attempts: 1 Airway Equipment and Method: Stylet Placement Confirmation: ETT inserted through vocal cords under direct vision,  positive ETCO2 and breath sounds checked- equal and bilateral Secured at: 22 cm Tube secured with: Tape Dental Injury: Teeth and Oropharynx as per pre-operative assessment

## 2020-10-11 NOTE — Progress Notes (Signed)
PROGRESS NOTE    Regina Mckay  ZOX:[id number] DOB: October 12, 1950 DOA: 10/09/2020 PCP: Jani Gravel, MD   Chief Complaint  Patient presents with  . stroke like s/s    Brief Narrative:   Regina Mckay is a very pleasant right-handed 70 y.o. female with medical history significant for type 2 diabetes mellitus, hypertension, and BMI 58, now presenting to the emergency department for evaluation of right hand weakness and speech disturbance.   Imaging notable for dural based lesion of the left middle cranial fossal most c/w meningioma.  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  Neurosurgery c/s pending.    Assessment & Plan:   Principal Problem:   Episode of transient neurologic symptoms Active Problems:   Uncontrolled diabetes mellitus with hyperglycemia (HCC)   Polycythemia   Hypertensive urgency   Speech abnormality  1. Transient speech disturbance  right hand weakness  left middle cranial fossa lesion  - Neurology consulting and much appreciated -> concerning for new onset of partial complex seizures secondary to left temporal lobe meningioma  - Started on keppra 500 mg BID - witnessed event this AM, will discuss with neurology - EEG without seizure or epileptiform discharges - neurosurgery planning for resection today - MRI with meningoma in L middle cranial fossa extending to tentorium and cavernous sinus - RCRI 1 for DM/insulin - EKG appears similar to priors, CXR with subsegmental atelectasis.  She's unable to do stairs due to hx knee surgery, denies CP or SOB with ambulation.  2. Hypertensive urgency  - BP as high as 250/110 in ED in setting of anxiety and running out of her antihypertensives  - Improved with Ativan in ED  - Continue norvasc - lisinopril on hold with surgery  3. Uncontrolled type II DM  - A1c 13.6 - She has been out of her oral diabetes medications and had not used insulin in a couple  days  - continue lantus, SSI q4 while NPO  4. Polycythemia  - Hgb 17.9 on admission  - Patient not hypovolemic, denies smoking or lung disease, denies chest pain, and no CVA on MRI  - Check serum epo (pending) - workup further outpatient  Hasn't seen her PCP for several years, will probably need refills of most meds at discharged.  She's got appt with new doc in practice.   DVT prophylaxis: SCD Code Status: full  Family Communication: none at bedside - sister over phone Disposition:   Status is: Observation  The patient will require care spanning > 2 midnights and should be moved to inpatient because: Inpatient level of care appropriate due to severity of illness  Dispo: The patient is from: Home              Anticipated d/c is to: Home              Patient currently is not medically stable to d/c.   Difficult to place patient No       Consultants:   Neurology  neurosurgery  Procedures:  EEG pending  Antimicrobials: Anti-infectives (From admission, onward)   Start     Dose/Rate Route Frequency Ordered Stop   10/11/20 0600  vancomycin (VANCOREADY) IVPB 1500 mg/300 mL        1,500 mg 150 mL/hr over 120 Minutes Intravenous On call to O.R. 10/10/20 1319 10/12/20 0559         Subjective: No complaints this AM Has episode where she's \  unable to speakl. Lasts 2-3 min.  Objective: Vitals:   10/11/20 0000 10/11/20 0200 10/11/20 0400 10/11/20 0600  BP: 96/62 117/67 127/70 134/75  Pulse: 84 80 81 81  Resp: (!) 21 (!) 24 20 19   Temp:      TempSrc:      SpO2: 94% 94% 93% 95%  Weight:      Height:       No intake or output data in the 24 hours ending 10/11/20 0822 Filed Weights   10/09/20 1401  Weight: 108 kg    Examination:  General: No acute distress. Cardiovascular: Heart sounds show a regular rate, and rhythm. Lungs: Clear to auscultation bilaterally  Abdomen: Soft, nontender, nondistended Neurological: Alert and oriented 3. Moves all extremities  4 with equal strength. Cranial nerves II through XII grossly intact.  Episode of expressive aphasia and R sided weakness which lasts 2-3 minutes - resolved.   Skin: Warm and dry. No rashes or lesions. Extremities: No clubbing or cyanosis. No edema.   Data Reviewed: I have personally reviewed following labs and imaging studies  CBC: Recent Labs  Lab 10/09/20 1337 10/10/20 0443 10/11/20 0603  WBC 7.8 6.1 6.0  NEUTROABS 5.5  --  4.0  HGB 17.9* 17.2* 17.3*  HCT 53.9* 51.2* 52.6*  MCV 86.4 86.2 87.8  PLT 218 203 440    Basic Metabolic Panel: Recent Labs  Lab 10/09/20 1337 10/10/20 0443 10/11/20 0603  NA 133* 133* 135  K 3.8 3.8 4.3  CL 94* 96* 99  CO2 24 25 27   GLUCOSE 416* 325* 385*  BUN 9 10 16   CREATININE 0.89 0.80 0.88  CALCIUM 9.4 9.5 9.1  MG  --   --  1.8  PHOS  --   --  4.6    GFR: Estimated Creatinine Clearance: 70.4 mL/min (by C-G formula based on SCr of 0.88 mg/dL).  Liver Function Tests: Recent Labs  Lab 10/09/20 1337 10/11/20 0603  AST 17 13*  ALT 18 16  ALKPHOS 67 52  BILITOT 1.4* 1.4*  PROT 7.7 6.5  ALBUMIN 4.1 3.3*    CBG: Recent Labs  Lab 10/10/20 0745 10/10/20 1212 10/10/20 1658 10/10/20 1818 10/10/20 2250  GLUCAP 322* 221* 246* 242* 389*     Recent Results (from the past 240 hour(s))  Resp Panel by RT-PCR (Flu A&B, Covid) Nasopharyngeal Swab     Status: None   Collection Time: 10/09/20  9:09 PM   Specimen: Nasopharyngeal Swab; Nasopharyngeal(NP) swabs in vial transport medium  Result Value Ref Range Status   SARS Coronavirus 2 by RT PCR NEGATIVE NEGATIVE Final    Comment: (NOTE) SARS-CoV-2 target nucleic acids are NOT DETECTED.  The SARS-CoV-2 RNA is generally detectable in upper respiratory specimens during the acute phase of infection. The lowest concentration of SARS-CoV-2 viral copies this assay can detect is 138 copies/mL. A negative result does not preclude SARS-Cov-2 infection and should not be used as the sole basis  for treatment or other patient management decisions. A negative result may occur with  improper specimen collection/handling, submission of specimen other than nasopharyngeal swab, presence of viral mutation(s) within the areas targeted by this assay, and inadequate number of viral copies(<138 copies/mL). A negative result must be combined with clinical observations, patient history, and epidemiological information. The expected result is Negative.  Fact Sheet for Patients:  EntrepreneurPulse.com.au  Fact Sheet for Healthcare Providers:  IncredibleEmployment.be  This test is no t yet approved or cleared by the [place] and  has been authorized for detection and/or diagnosis of SARS-CoV-2 by FDA under an Emergency Use Authorization (EUA). This EUA will remain  in effect (meaning this test can be used) for the duration of the COVID-19 declaration under Section 564(b)(1) of the Act, 21 U.S.C.section 360bbb-3(b)(1), unless the authorization is terminated  or revoked sooner.       Influenza A by PCR NEGATIVE NEGATIVE Final   Influenza B by PCR NEGATIVE NEGATIVE Final    Comment: (NOTE) The Xpert Xpress SARS-CoV-2/FLU/RSV plus assay is intended as an aid in the diagnosis of influenza from Nasopharyngeal swab specimens and should not be used as a sole basis for treatment. Nasal washings and aspirates are unacceptable for Xpert Xpress SARS-CoV-2/FLU/RSV testing.  Fact Sheet for Patients: EntrepreneurPulse.com.au  Fact Sheet for Healthcare Providers: IncredibleEmployment.be  This test is not yet approved or cleared by the [place] FDA and has been authorized for detection and/or diagnosis of SARS-CoV-2 by FDA under an Emergency Use Authorization (EUA). This EUA will remain in effect (meaning this test can be used) for the duration of the COVID-19 declaration under Section 564(b)(1) of the Act, 21  U.S.C. section 360bbb-3(b)(1), unless the authorization is terminated or revoked.  Performed at [facility], [place] [address], [place], [place] [address]          Radiology Studies: CT HEAD WO CONTRAST  Result Date: 10/09/2020 CLINICAL DATA:  Three day history of right-sided weakness. History of hypertension. EXAM: CT HEAD WITHOUT CONTRAST TECHNIQUE: Contiguous axial images were obtained from the base of the skull through the vertex without intravenous contrast. COMPARISON:  Prior study from 2006. FINDINGS: Brain: The ventricles are in the midline without mass effect or shift. They are normal in size and configuration for the patient's age and degree of cerebral atrophy. No extra-axial fluid collections are identified. The gray-white differentiation is maintained. There is an abnormal appearance of the left temporal lobe. The sulci are compressed and the left temporal horn is also compressed. Vague low-attenuation noted in the central portion of the temporal lobe. This could be a subacute infarct. Neoplasm would be another possibility. In the right clinical situation herpes would be a consideration also. Recommend MRI brain without and with contrast for further evaluation. Vascular: No hyperdense vessels or obvious aneurysm. Moderate atherosclerotic calcifications. Skull: No skull lesions or fractures. Mild hyperostosis frontalis interna. Sinuses/Orbits: The paranasal sinuses and mastoid air cells are clear. The globes are intact. Other: No scalp lesions or scalp hematoma. IMPRESSION: 1. Abnormal appearance of the left temporal lobe. This could be a subacute infarct. Neoplasm would be another possibility. In the right clinical situation herpes would be a consideration also. Recommend MRI brain without and with contrast for further evaluation. 2. No other significant findings. Electronically Signed   By: Marijo Sanes M.D.   On: 10/09/2020 15:10   MR BRAIN W WO CONTRAST  Result Date:  10/10/2020 CLINICAL DATA:  Preop tumor evaluation. EXAM: MRI HEAD WITHOUT AND WITH CONTRAST TECHNIQUE: Multiplanar, multiecho pulse sequences of the brain and surrounding structures were obtained without and with intravenous contrast. CONTRAST:  63mL GADAVIST GADOBUTROL 1 MMOL/ML IV SOLN COMPARISON:  MRI head 10/09/2020 FINDINGS: Brain: BrainLAB protocol for surgical planning purposes. Enhancing mass lesion in the left middle cranial fossa is extra-axial and dural based. The mass measures 29 x 41 mm on axial postcontrast images. This is underneath the left temporal lobe and extends to the left cavernous sinus. Mild susceptibility due to mineralization. No significant edema. No change  from yesterday. There is mild thickening enhancement of the tentorium adjacent to the tumor. Ventricle size normal. No midline shift. Scattered small white matter hyperintensities, mild. No acute infarct. Vascular: Normal arterial flow voids. Skull and upper cervical spine: No skeletal lesion. Sinuses/Orbits: Paranasal sinuses clear. Bilateral cataract extraction. Other: None IMPRESSION: Meningioma in the left middle cranial fossa extending to the tentorium and cavernous sinus. This is under the left temporal lobe. No significant brain edema. Electronically Signed   By: Franchot Gallo M.D.   On: 10/10/2020 18:11   MR Brain W and Wo Contrast  Result Date: 10/09/2020 CLINICAL DATA:  Abnormal CT, hypertensive with right-sided weakness EXAM: MRI HEAD WITHOUT AND WITH CONTRAST TECHNIQUE: Multiplanar, multiecho pulse sequences of the brain and surrounding structures were obtained without and with intravenous contrast. CONTRAST:  10.53mL GADAVIST GADOBUTROL 1 MMOL/ML IV SOLN COMPARISON:  Correlation made with same day head CT FINDINGS: Brain: Relatively homogeneously enhancing dural-based lesion is present within the left middle cranial fossa. Measures approximately 3.6 x 3.8 x 2.1 cm. There is some associated susceptibility medially likely  reflecting mineralization. Dural enhancement is present along the adjacent tentorial incisura. No evidence of cavernous sinus or orbital apex involvement. Mild mass effect on the left temporal lobe but no apparent parenchymal edema. There is no acute infarction. No additional mass or abnormal enhancement. Ventricles and sulci are normal in size and configuration. Minimal patchy foci of T2 hyperintensity in the supratentorial white matter are nonspecific but may reflect minor chronic microvascular ischemic changes. Vascular: Major vessel flow voids at the skull base are preserved. Skull and upper cervical spine: Normal marrow signal is preserved. Sinuses/Orbits: Paranasal sinuses are aerated. Orbits are unremarkable. Other: Sella is unremarkable.  Mastoid air cells are clear. IMPRESSION: Dural-based lesion of the left middle cranial fossa most consistent with a meningioma. Mild displacement of the adjacent temporal lobe without parenchymal edema. Electronically Signed   By: Macy Mis M.D.   On: 10/09/2020 20:30   DG CHEST PORT 1 VIEW  Result Date: 10/10/2020 CLINICAL DATA:  Preop. EXAM: PORTABLE CHEST 1 VIEW COMPARISON:  None. FINDINGS: The cardiomediastinal contours are normal. Linear subsegmental atelectasis at the left lung base. Pulmonary vasculature is normal. No consolidation, pleural effusion, or pneumothorax. No acute osseous abnormalities are seen. IMPRESSION: Linear subsegmental atelectasis at the left lung base. Electronically Signed   By: Keith Rake M.D.   On: 10/10/2020 21:17   EEG adult  Result Date: 10/10/2020 Lora Havens, MD     10/10/2020 11:54 AM Patient Name: ADDYLYNN BALIN MRN: [id number] Epilepsy Attending: Lora Havens Referring Physician/Provider: Dr. Mitzi Hansen Date: 10/10/2020 Duration: 22.40 mins Patient history: 70 year old female with a PMHx of DM and HTN who presents to the ED with recurrent spells of expressive dysphasia over the past 6 days. EEG to evaluate for  seizures Level of alertness: Awake, asleep AEDs during EEG study: None Technical aspects: This EEG study was done with scalp electrodes positioned according to the 10-20 International system of electrode placement. Electrical activity was acquired at a sampling rate of 500Hz  and reviewed with a high frequency filter of 70Hz  and a low frequency filter of 1Hz . EEG data were recorded continuously and digitally stored. Description: The posterior dominant rhythm consists of 9-10 Hz activity of moderate voltage (25-35 uV) seen predominantly in posterior head regions, symmetric and reactive to eye opening and eye closing. Sleep was characterized by vertex waves, sleep spindles (12 to 14 Hz), maximal frontocentral region. EEG showed  intermittent  3 to 6 Hz theta-delta slowing in left temporal region. Hyperventilation did not show any EEG change. Physiologic photic driving was not seen during photic stimulation. ABNORMALITY -Intermittent slow, left temporal region IMPRESSION: This study is suggestive of nonspecific cortical dysfunction in left temporal region.  No seizures or epileptiform discharges were seen throughout the recording. [name]        Scheduled Meds: . amLODipine  10 mg Oral Daily  . insulin aspart  0-15 Units Subcutaneous Q4H  . insulin glargine  25 Units Subcutaneous Daily  . levETIRAcetam  500 mg Oral BID  . pravastatin  40 mg Oral q1800   Continuous Infusions: . sodium chloride    . 0.9 % NaCl with KCl 20 mEq / L 75 mL/hr at 10/11/20 0253  . vancomycin       LOS: 1 day    Time spent: over 30 min    Fayrene Helper, MD [facility]   To contact the attending provider between 7A-7P or the covering provider during after hours 7P-7A, please log into the web site [email or web address] and access using universal [place] password for that web site. If you do not have the password, please call the hospital operator.  10/11/2020, 8:22 AM

## 2020-10-12 ENCOUNTER — Encounter (HOSPITAL_COMMUNITY): Payer: Self-pay | Admitting: Neurosurgery

## 2020-10-12 ENCOUNTER — Inpatient Hospital Stay (HOSPITAL_COMMUNITY): Payer: Medicare Other

## 2020-10-12 DIAGNOSIS — I160 Hypertensive urgency: Secondary | ICD-10-CM | POA: Diagnosis not present

## 2020-10-12 LAB — TYPE AND SCREEN
ABO/RH(D): O POS
Antibody Screen: NEGATIVE
Unit division: 0
Unit division: 0
Unit division: 0
Unit division: 0

## 2020-10-12 LAB — GLUCOSE, CAPILLARY
Glucose-Capillary: 104 mg/dL — ABNORMAL HIGH (ref 70–99)
Glucose-Capillary: 145 mg/dL — ABNORMAL HIGH (ref 70–99)
Glucose-Capillary: 151 mg/dL — ABNORMAL HIGH (ref 70–99)
Glucose-Capillary: 151 mg/dL — ABNORMAL HIGH (ref 70–99)
Glucose-Capillary: 153 mg/dL — ABNORMAL HIGH (ref 70–99)
Glucose-Capillary: 160 mg/dL — ABNORMAL HIGH (ref 70–99)
Glucose-Capillary: 230 mg/dL — ABNORMAL HIGH (ref 70–99)
Glucose-Capillary: 277 mg/dL — ABNORMAL HIGH (ref 70–99)
Glucose-Capillary: 287 mg/dL — ABNORMAL HIGH (ref 70–99)

## 2020-10-12 LAB — BPAM RBC
Blood Product Expiration Date: 202204142359
Blood Product Expiration Date: 202204142359
Blood Product Expiration Date: 202204142359
Blood Product Expiration Date: 202204142359
ISSUE DATE / TIME: 202203101649
ISSUE DATE / TIME: 202203101649
Unit Type and Rh: 5100
Unit Type and Rh: 5100
Unit Type and Rh: 5100
Unit Type and Rh: 5100

## 2020-10-12 MED ORDER — INSULIN GLARGINE 100 UNIT/ML ~~LOC~~ SOLN
10.0000 [IU] | Freq: Once | SUBCUTANEOUS | Status: AC
  Administered 2020-10-12: 10 [IU] via SUBCUTANEOUS
  Filled 2020-10-12: qty 0.1

## 2020-10-12 MED ORDER — GADOBUTROL 1 MMOL/ML IV SOLN
10.0000 mL | Freq: Once | INTRAVENOUS | Status: AC | PRN
  Administered 2020-10-12: 10 mL via INTRAVENOUS

## 2020-10-12 MED ORDER — LORAZEPAM 2 MG/ML IJ SOLN
1.0000 mg | Freq: Once | INTRAMUSCULAR | Status: AC
  Administered 2020-10-12: 1 mg via INTRAVENOUS

## 2020-10-12 MED ORDER — LORAZEPAM 2 MG/ML IJ SOLN
INTRAMUSCULAR | Status: AC
  Filled 2020-10-12: qty 1

## 2020-10-12 MED ORDER — INSULIN ASPART 100 UNIT/ML ~~LOC~~ SOLN: 0.0000 [IU] | Freq: Three times a day (TID) | SUBCUTANEOUS | Status: DC

## 2020-10-12 MED ORDER — INSULIN ASPART 100 UNIT/ML ~~LOC~~ SOLN: 4.0000 [IU] | Freq: Three times a day (TID) | SUBCUTANEOUS | Status: DC

## 2020-10-12 MED ORDER — INSULIN ASPART 100 UNIT/ML ~~LOC~~ SOLN
0.0000 [IU] | Freq: Every day | SUBCUTANEOUS | Status: DC
  Administered 2020-10-12: 3 [IU] via SUBCUTANEOUS

## 2020-10-12 MED ORDER — LABETALOL HCL 5 MG/ML IV SOLN: 10.0000 mg | INTRAVENOUS | Status: DC | PRN

## 2020-10-12 MED ORDER — ENALAPRILAT 1.25 MG/ML IV SOLN
1.2500 mg | Freq: Four times a day (QID) | INTRAVENOUS | Status: DC | PRN
  Filled 2020-10-12: qty 1

## 2020-10-12 MED ORDER — LISINOPRIL 20 MG PO TABS
20.0000 mg | ORAL_TABLET | Freq: Every day | ORAL | Status: DC
  Administered 2020-10-12 – 2020-10-19 (×8): 20 mg via ORAL
  Filled 2020-10-12 (×7): qty 1

## 2020-10-12 MED FILL — Thrombin (Recombinant) For Soln 5000 Unit: CUTANEOUS | Qty: 5000 | Status: AC

## 2020-10-12 MED FILL — Thrombin For Soln 5000 Unit: CUTANEOUS | Qty: 5000 | Status: AC

## 2020-10-12 NOTE — Progress Notes (Signed)
PROGRESS NOTE    Regina Mckay  XAJ:[id number] DOB: 1950/09/11 DOA: 10/09/2020 PCP: Jani Gravel, MD   Chief Complaint  Patient presents with  . stroke like s/s    Brief Narrative:   EVOLETH NORDMEYER is Regina Mckay very pleasant right-handed 70 y.o. female with medical history significant for type 2 diabetes mellitus, hypertension, and BMI 41, now presenting to the emergency department for evaluation of right hand weakness and speech disturbance.   Imaging notable for dural based lesion of the left middle cranial fossal most c/w meningioma.  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  Neurosurgery c/s pending.    Assessment & Plan:   Principal Problem:   Episode of transient neurologic symptoms Active Problems:   Uncontrolled diabetes mellitus with hyperglycemia (HCC)   Polycythemia   Hypertensive urgency   Speech abnormality  1. 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  - continue keppra 500 mg BID  -  S/p craniotomy for resection of meningioma, application of cranial navigation 3/11 - MRI 3/11 follow up with increased dural thickness and contrast enhancement in the anterior left middle cranial fossa extending along the sphenoid wing and tentorial incisura - postsurgical changes - neurosurgery c/s, appreciate recs - on dex taper, d/c foley, aline - transfer to progressive today if able - EEG without seizure or epileptiform discharges  2. Hypertensive urgency  - BP as high as 250/110 in ED in setting of anxiety and running out of her antihypertensives  - wean cardene as able - per discussion with neurosurgery, BP goal < 094 systolic - restart lisinopril, continue amlodipine - prn enalaprilat and labetalol   3. Uncontrolled type II  DM  - A1c 13.6 - lantus, basal, bolus, SSI   4. Polycythemia  - Hgb 17.9 on admission  - Patient not hypovolemic, denies smoking or lung disease, denies chest pain, and no CVA on MRI  - Check serum epo (elevated, will consider discussion with heme) - workup further outpatient  Hasn't seen her PCP for several years, will probably need refills of most meds at discharged.  She's got appt with new doc in practice.   DVT prophylaxis: SCD Code Status: full  Family Communication: daughter at bedside Disposition:   Status is: Observation  The patient will require care spanning > 2 midnights and should be moved to inpatient because: Inpatient level of care appropriate due to severity of illness  Dispo: The patient is from: Home              Anticipated d/c is to: Home              Patient currently is not medically stable to d/c.   Difficult to place patient No       Consultants:   Neurology  neurosurgery  Procedures:  EEG pending  Antimicrobials: Anti-infectives (From admission, onward)   Start     Dose/Rate Route Frequency Ordered Stop   10/12/20 0300  vancomycin (VANCOREADY) IVPB 750 mg/150 mL        750 mg 150 mL/hr over 60 Minutes Intravenous Every 12 hours 10/11/20 2057 10/13/20 1459   10/11/20 0600  vancomycin (VANCOREADY) IVPB 1500 mg/300 mL  Status:  Discontinued        1,500 mg 150 mL/hr over 120 Minutes  Intravenous On call to O.R. 10/10/20 1319 10/11/20 2017         Subjective: Feeling ok after surgery  Objective: Vitals:   10/12/20 1315 10/12/20 1330 10/12/20 1515 10/12/20 1530  BP: (!) 135/54 (!) 146/51 112/68 (!) 127/52  Pulse: (!) 107 (!) 106 (!) 104 (!) 104  Resp: 20 19 19  (!) 21  Temp:      TempSrc:      SpO2: 96% 95% 96% 92%  Weight:      Height:        Intake/Output Summary (Last 24 hours) at 10/12/2020 1632 Last data filed at 10/12/2020 1521 Gross per 24 hour  Intake 3751.33 ml  Output 2425 ml  Net 1326.33 ml   Filed Weights    10/09/20 1401  Weight: 108 kg    Examination:  General: No acute distress. HEENT: dressing intact surgical site Cardiovascular: Heart sounds show Jeremias Broyhill regular rate, and rhythm. . Lungs: Clear to auscultation bilaterally  Abdomen: Soft, nontender, nondistended  Neurological: Alert and oriented 3. Moves all extremities 4. Cranial nerves II through XII grossly intact. Skin: Warm and dry. No rashes or lesions. Extremities: No clubbing or cyanosis. No edema.   Data Reviewed: I have personally reviewed following labs and imaging studies  CBC: Recent Labs  Lab 10/09/20 1337 10/10/20 0443 10/11/20 0603 10/11/20 1827 10/11/20 1833  WBC 7.8 6.1 6.0  --   --   NEUTROABS 5.5  --  4.0  --   --   HGB 17.9* 17.2* 17.3* 15.3* 15.3*  HCT 53.9* 51.2* 52.6* 45.0 45.0  MCV 86.4 86.2 87.8  --   --   PLT 218 203 224  --   --     Basic Metabolic Panel: Recent Labs  Lab 10/09/20 1337 10/10/20 0443 10/11/20 0603 10/11/20 1827 10/11/20 1833  NA 133* 133* 135 136 137  K 3.8 3.8 4.3 4.3 4.3  CL 94* 96* 99  --  101  CO2 24 25 27   --   --   GLUCOSE 416* 325* 385*  --  267*  BUN 9 10 16   --  17  CREATININE 0.89 0.80 0.88  --  0.60  CALCIUM 9.4 9.5 9.1  --   --   MG  --   --  1.8  --   --   PHOS  --   --  4.6  --   --     GFR: Estimated Creatinine Clearance: 77.4 mL/min (by C-G formula based on SCr of 0.6 mg/dL).  Liver Function Tests: Recent Labs  Lab 10/09/20 1337 10/11/20 0603  AST 17 13*  ALT 18 16  ALKPHOS 67 52  BILITOT 1.4* 1.4*  PROT 7.7 6.5  ALBUMIN 4.1 3.3*    CBG: Recent Labs  Lab 10/12/20 0342 10/12/20 0557 10/12/20 0826 10/12/20 1139 10/12/20 1515  GLUCAP 153* 104* 151* 230* 277*     Recent Results (from the past 240 hour(s))  Resp Panel by RT-PCR (Flu Sharaine Delange&B, Covid) Nasopharyngeal Swab     Status: None   Collection Time: 10/09/20  9:09 PM   Specimen: Nasopharyngeal Swab; Nasopharyngeal(NP) swabs in vial transport medium  Result Value Ref Range Status    SARS Coronavirus 2 by RT PCR NEGATIVE NEGATIVE Final    Comment: (NOTE) SARS-CoV-2 target nucleic acids are NOT DETECTED.  The SARS-CoV-2 RNA is generally detectable in upper respiratory specimens during the acute phase of infection. The lowest concentration of SARS-CoV-2 viral copies this assay can detect is 138 copies/mL.  Gursimran Litaker negative result does not preclude SARS-Cov-2 infection and should not be used as the sole basis for treatment or other patient management decisions. Mischa Pollard negative result may occur with  improper specimen collection/handling, submission of specimen other than nasopharyngeal swab, presence of viral mutation(s) within the areas targeted by this assay, and inadequate number of viral copies(<138 copies/mL). Teneil Shiller negative result must be combined with clinical observations, patient history, and epidemiological information. The expected result is Negative.  Fact Sheet for Patients:  EntrepreneurPulse.com.au  Fact Sheet for Healthcare Providers:  IncredibleEmployment.be  This test is no t yet approved or cleared by the [place] FDA and  has been authorized for detection and/or diagnosis of SARS-CoV-2 by FDA under an Emergency Use Authorization (EUA). This EUA will remain  in effect (meaning this test can be used) for the duration of the COVID-19 declaration under Section 564(b)(1) of the Act, 21 U.S.C.section 360bbb-3(b)(1), unless the authorization is terminated  or revoked sooner.       Influenza [employer] by PCR NEGATIVE NEGATIVE Final   Influenza B by PCR NEGATIVE NEGATIVE Final    Comment: (NOTE) The Xpert Xpress SARS-CoV-2/FLU/RSV plus assay is intended as an aid in the diagnosis of influenza from Nasopharyngeal swab specimens and should not be used as Ayven Pheasant sole basis for treatment. Nasal washings and aspirates are unacceptable for Xpert Xpress SARS-CoV-2/FLU/RSV testing.  Fact Sheet for  Patients: EntrepreneurPulse.com.au  Fact Sheet for Healthcare Providers: IncredibleEmployment.be  This test is not yet approved or cleared by the [place] FDA and has been authorized for detection and/or diagnosis of SARS-CoV-2 by FDA under an Emergency Use Authorization (EUA). This EUA will remain in effect (meaning this test can be used) for the duration of the COVID-19 declaration under Section 564(b)(1) of the Act, 21 U.S.C. section 360bbb-3(b)(1), unless the authorization is terminated or revoked.  Performed at [facility], [place] [address] [place], [place] [address]          Radiology Studies: MR BRAIN W WO CONTRAST  Result Date: 10/12/2020 CLINICAL DATA:  Brain mass or lesion. EXAM: MRI HEAD WITHOUT AND WITH CONTRAST TECHNIQUE: Multiplanar, multiecho pulse sequences of the brain and surrounding structures were obtained without and with intravenous contrast. CONTRAST:  62mL GADAVIST GADOBUTROL 1 MMOL/ML IV SOLN COMPARISON:  MRI of the brain October 10, 2020 FINDINGS: The study is degraded by motion. Brain: Subjacent to the area of recent craniotomy, there is increased dural thickness and contrast enhancement in the anterior left middle cranial fossa extending along the sphenoid wing and tentorial incisura. Findings reflect postsurgical changes although residual tumor cannot be entirely excluded. Continued follow-up suggested. The left temporal lobe appear preserved. No acute infarction, hemorrhage or hydrocephalus. Vascular: Normal flow voids. Skull and upper cervical spine: Postsurgical changes from left frontotemporal craniotomy. Marrow signal characteristics are otherwise maintained. Sinuses/Orbits: Bilateral lens surgery. Paranasal sinuses are clear. Other: Left mastoid effusion. IMPRESSION: 1. Increased dural thickness and contrast enhancement in the anterior left middle cranial fossa extending along the sphenoid wing and tentorial  incisura. Findings reflect postsurgical changes although residual tumor cannot be entirely excluded. Continued follow-up suggested. 2. Left mastoid effusion. Electronically Signed   By: Pedro Earls M.D.   On: 10/12/2020 11:36   MR BRAIN W WO CONTRAST  Result Date: 10/10/2020 CLINICAL DATA:  Preop tumor evaluation. EXAM: MRI HEAD WITHOUT AND WITH CONTRAST TECHNIQUE: Multiplanar, multiecho pulse sequences of the brain and surrounding structures were obtained without and with intravenous contrast. CONTRAST:  56mL  GADAVIST GADOBUTROL 1 MMOL/ML IV SOLN COMPARISON:  MRI head 10/09/2020 FINDINGS: Brain: BrainLAB protocol for surgical planning purposes. Enhancing mass lesion in the left middle cranial fossa is extra-axial and dural based. The mass measures 29 x 41 mm on axial postcontrast images. This is underneath the left temporal lobe and extends to the left cavernous sinus. Mild susceptibility due to mineralization. No significant edema. No change from yesterday. There is mild thickening enhancement of the tentorium adjacent to the tumor. Ventricle size normal. No midline shift. Scattered small white matter hyperintensities, mild. No acute infarct. Vascular: Normal arterial flow voids. Skull and upper cervical spine: No skeletal lesion. Sinuses/Orbits: Paranasal sinuses clear. Bilateral cataract extraction. Other: None IMPRESSION: Meningioma in the left middle cranial fossa extending to the tentorium and cavernous sinus. This is under the left temporal lobe. No significant brain edema. Electronically Signed   By: Franchot Gallo M.D.   On: 10/10/2020 18:11   DG CHEST PORT 1 VIEW  Result Date: 10/10/2020 CLINICAL DATA:  Preop. EXAM: PORTABLE CHEST 1 VIEW COMPARISON:  None. FINDINGS: The cardiomediastinal contours are normal. Linear subsegmental atelectasis at the left lung base. Pulmonary vasculature is normal. No consolidation, pleural effusion, or pneumothorax. No acute osseous abnormalities are  seen. IMPRESSION: Linear subsegmental atelectasis at the left lung base. Electronically Signed   By: Keith Rake M.D.   On: 10/10/2020 21:17        Scheduled Meds: . amLODipine  10 mg Oral Daily  . Chlorhexidine Gluconate Cloth  6 each Topical Daily  . dexamethasone  4 mg Oral Q6H   Followed by  . [START ON 10/14/2020] dexamethasone  4 mg Oral Q8H   Followed by  . [START ON 10/15/2020] dexamethasone  2 mg Oral Q8H   Followed by  . [START ON 10/17/2020] dexamethasone  2 mg Oral Q12H   Followed by  . [START ON 10/19/2020] dexamethasone  1 mg Oral Q12H   Followed by  . [START ON 10/22/2020] dexamethasone  1 mg Oral Daily  . docusate sodium  100 mg Oral BID  . [START ON 10/13/2020] heparin injection (subcutaneous)  5,000 Units Subcutaneous Q8H  . [START ON 10/13/2020] insulin aspart  0-15 Units Subcutaneous TID WC  . insulin aspart  0-5 Units Subcutaneous QHS  . [START ON 10/13/2020] insulin aspart  4 Units Subcutaneous TID WC  . insulin glargine  10 Units Subcutaneous Once  . insulin glargine  25 Units Subcutaneous Daily  . levETIRAcetam  500 mg Oral BID  . lisinopril  20 mg Oral Daily  . pantoprazole  40 mg Oral Daily  . pravastatin  40 mg Oral q1800   Continuous Infusions: . 0.9 % NaCl with KCl 20 mEq / L Stopped (10/12/20 1434)  . insulin Stopped (10/12/20 0801)  . niCARDipine 10 mg/hr (10/12/20 1521)  . vancomycin 150 mL/hr at 10/12/20 1521     LOS: 2 days    Time spent: over 30 min    Fayrene Helper, MD [facility]   To contact the attending provider between 7A-7P or the covering provider during after hours 7P-7A, please log into the web site [email or web address] and access using universal [place] password for that web site. If you do not have the password, please call the hospital operator.  10/12/2020, 4:32 PM

## 2020-10-12 NOTE — Progress Notes (Signed)
Subjective: Patient reports mild HA  Objective: Vital signs in last 24 hours: Temp:  [97.4 F (36.3 C)-99.8 F (37.7 C)] 98.7 F (37.1 C) (03/11 0800) Pulse Rate:  [72-100] 95 (03/11 0807) Resp:  [0-28] 17 (03/11 0700) BP: (103-174)/(56-80) 131/67 (03/11 0807) SpO2:  [91 %-99 %] 98 % (03/11 0807) Arterial Line BP: (61-200)/(56-102) 105/102 (03/11 0600)  Intake/Output from previous day: 03/10 0701 - 03/11 0700 In: 3163.1 [I.V.:2193.2; IV Piggyback:969.9] Out: 2425 [Urine:1925; Drains:150; Blood:350] Intake/Output this shift: Total I/O In: 389.1 [I.V.:389.1] Out: -   Speech slow but fluent A+Ox3 FC x 4, no drift  Lab Results: Recent Labs    10/10/20 0443 10/11/20 0603 10/11/20 1827 10/11/20 1833  WBC 6.1 6.0  --   --   HGB 17.2* 17.3* 15.3* 15.3*  HCT 51.2* 52.6* 45.0 45.0  PLT 203 224  --   --    BMET Recent Labs    10/10/20 0443 10/11/20 0603 10/11/20 1827 10/11/20 1833  NA 133* 135 136 137  K 3.8 4.3 4.3 4.3  CL 96* 99  --  101  CO2 25 27  --   --   GLUCOSE 325* 385*  --  267*  BUN 10 16  --  17  CREATININE 0.80 0.88  --  0.60  CALCIUM 9.5 9.1  --   --     Studies/Results: MR BRAIN W WO CONTRAST  Result Date: 10/10/2020 CLINICAL DATA:  Preop tumor evaluation. EXAM: MRI HEAD WITHOUT AND WITH CONTRAST TECHNIQUE: Multiplanar, multiecho pulse sequences of the brain and surrounding structures were obtained without and with intravenous contrast. CONTRAST:  4mL GADAVIST GADOBUTROL 1 MMOL/ML IV SOLN COMPARISON:  MRI head 10/09/2020 FINDINGS: Brain: BrainLAB protocol for surgical planning purposes. Enhancing mass lesion in the left middle cranial fossa is extra-axial and dural based. The mass measures 29 x 41 mm on axial postcontrast images. This is underneath the left temporal lobe and extends to the left cavernous sinus. Mild susceptibility due to mineralization. No significant edema. No change from yesterday. There is mild thickening enhancement of the tentorium  adjacent to the tumor. Ventricle size normal. No midline shift. Scattered small white matter hyperintensities, mild. No acute infarct. Vascular: Normal arterial flow voids. Skull and upper cervical spine: No skeletal lesion. Sinuses/Orbits: Paranasal sinuses clear. Bilateral cataract extraction. Other: None IMPRESSION: Meningioma in the left middle cranial fossa extending to the tentorium and cavernous sinus. This is under the left temporal lobe. No significant brain edema. Electronically Signed   By: Franchot Gallo M.D.   On: 10/10/2020 18:11   DG CHEST PORT 1 VIEW  Result Date: 10/10/2020 CLINICAL DATA:  Preop. EXAM: PORTABLE CHEST 1 VIEW COMPARISON:  None. FINDINGS: The cardiomediastinal contours are normal. Linear subsegmental atelectasis at the left lung base. Pulmonary vasculature is normal. No consolidation, pleural effusion, or pneumothorax. No acute osseous abnormalities are seen. IMPRESSION: Linear subsegmental atelectasis at the left lung base. Electronically Signed   By: Keith Rake M.D.   On: 10/10/2020 21:17   EEG adult  Result Date: 10/10/2020 Lora Havens, MD     10/10/2020 11:54 AM Patient Name: Regina Mckay MRN: [id number] Epilepsy Attending: Lora Havens Referring Physician/Provider: Dr. Mitzi Hansen Date: 10/10/2020 Duration: 22.40 mins Patient history: 70 year old female with a PMHx of DM and HTN who presents to the ED with recurrent spells of expressive dysphasia over the past 6 days. EEG to evaluate for seizures Level of alertness: Awake, asleep AEDs during EEG study: None  Technical aspects: This EEG study was done with scalp electrodes positioned according to the 10-20 International system of electrode placement. Electrical activity was acquired at a sampling rate of 500Hz  and reviewed with a high frequency filter of 70Hz  and a low frequency filter of 1Hz . EEG data were recorded continuously and digitally stored. Description: The posterior dominant rhythm consists of 9-10 Hz  activity of moderate voltage (25-35 uV) seen predominantly in posterior head regions, symmetric and reactive to eye opening and eye closing. Sleep was characterized by vertex waves, sleep spindles (12 to 14 Hz), maximal frontocentral region. EEG showed intermittent  3 to 6 Hz theta-delta slowing in left temporal region. Hyperventilation did not show any EEG change. Physiologic photic driving was not seen during photic stimulation. ABNORMALITY -Intermittent slow, left temporal region IMPRESSION: This study is suggestive of nonspecific cortical dysfunction in left temporal region.  No seizures or epileptiform discharges were seen throughout the recording. Regina Mckay    Assessment/Plan: S/p craniotomy for resection of L middle fossa meningioma via subtemporal approach - plan for MRI today - possible transfer to progressive care later today - PT/OT - d/c Foley and A-line - dex taper - Keppra given preoperative seizures   LOS: 2 days     Regina Mckay 10/12/2020, 10:02 AM

## 2020-10-12 NOTE — Progress Notes (Signed)
Neurosurgery  MRI reviewed.  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  Plan to transfer to PCU today.

## 2020-10-12 NOTE — Evaluation (Signed)
Physical Therapy Evaluation Patient Details Name: Regina Mckay MRN: [id number] DOB: [birth year] Today's Date: 10/12/2020   History of Present Illness  Pt is a 70 y.o. female who presented 10/09/20 with R hand weakness and speech disturbance. Per chart, weakness in hand began 3 days PTA, waxing and waning. MRI revealed dural based lesion in the L middle cranial fossa most consistent with meningioma and causing mild displacement of adjacent temporal lobe. EEG suggestive of nonspecific cortical dysfunction in L temporal region. 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 PMH: DM2, HTN, BMI 41.  Clinical Impression  Pt presents with condition above and deficits mentioned below, see PT Problem List. PTA, she was independent without an AD/AE for all functional mobility and living alone in a ground floor apartment. She does not negotiate stairs. Currently, pt very lethargic (likely due to medication), impacting her balance and placing her at a risk for falls. Pt demonstrates symmetrical and intact bil lower extremity strength, sensation, and coordination. She also displays almost negligible weakness in her R UE compared to her L and impaired proprioception in bil UEs (possibly also due to medication impacting her ability to concentrate on task). Pt performing bed mobility with mod I and transfers and bedroom mobility with a RW with min guard assist this date. Pt capable of ambulating without an AD but displays some balance deficits impacting her safety this date. Expecting pt will progress to baseline level of function as she becomes more awake/alert, thus not recommending follow-up PT or AD at d/c at this time. Will continue to follow acutely to maximize her safety and independence with all functional mobility.     Follow Up Recommendations No PT follow up;Supervision for mobility/OOB (as long as pt progresses as expected)    Equipment Recommendations  None recommended by PT     Recommendations for Other Services       Precautions / Restrictions Precautions Precautions: Fall Precaution Comments: Vacuum seal drain in head Restrictions Weight Bearing Restrictions: No      Mobility  Bed Mobility Overal bed mobility: Modified Independent             General bed mobility comments: Pt using rail and bed controls for all bed mobility.    Transfers Overall transfer level: Needs assistance Equipment used: Rolling walker (2 wheeled) Transfers: Sit to/from Stand Sit to Stand: Min guard;Min assist         General transfer comment: Min guard for safety and cues for hand placement to come to stand 1x from EOB and 1x from toilet to RW. Mild unsteadiness but no overt LOB noted.  Ambulation/Gait Ambulation/Gait assistance: Min guard Gait Distance (Feet): 20 Feet (x2 bouts) Assistive device: Rolling walker (2 wheeled);None Gait Pattern/deviations: Step-through pattern;Decreased stride length Gait velocity: reduced Gait velocity interpretation: <1.8 ft/sec, indicate of risk for recurrent falls General Gait Details: Pt ambulates at slow pace with mild unsteadiness but no overt LOB. Pt able to ambulate without AD towards end of session with min guard, but due to lethargy and unsteadiness likely from medications she benefits from RW at this time.  Stairs            Wheelchair Mobility    Modified Rankin (Stroke Patients Only) Modified Rankin (Stroke Patients Only) Pre-Morbid Rankin Score: No symptoms Modified Rankin: Moderate disability     Balance Overall balance assessment: Needs assistance Sitting-balance support: No upper extremity supported;Feet supported Sitting balance-Leahy Scale: Fair Sitting balance - Comments: Static sitting EOB no LOB,  supervision for safety.   Standing balance support: Bilateral upper extremity supported;No upper extremity supported;During functional activity Standing balance-Leahy Scale: Fair Standing balance  comment: Progressed from bil UEs on RW to no AD with mobility, slight unsteadiness noted but no overt LOB.                             Pertinent Vitals/Pain Pain Assessment: 0-10 Pain Score: 4  Pain Location: head Pain Descriptors / Indicators: Throbbing;Headache Pain Intervention(s): Limited activity within patient's tolerance;Monitored during session;Repositioned    Home Living Family/patient expects to be discharged to:: Private residence Living Arrangements: Alone Available Help at Discharge: Family;Available 24 hours/day Type of Home: Apartment (ground floor) Home Access: Level entry     Home Layout: One level Home Equipment: Grab bars - tub/shower;Hand held shower head;Cane - single point      Prior Function Level of Independence: Independent         Comments: Pt driving, cooking, cleaning, and mobile independently without AD/AE. Pt does not negotiate stairs.     Hand Dominance   Dominant Hand: Right    Extremity/Trunk Assessment   Upper Extremity Assessment Upper Extremity Assessment: RUE deficits/detail;LUE deficits/detail RUE Deficits / Details: Minimal if any weakness in R UE compared to L RUE Sensation: decreased proprioception (impaired static propioception, possibly due to medications impacting her ability to pay attention this date; reports intact sensation to touch) RUE Coordination: WNL LUE Sensation: decreased proprioception (impaired static propioception, possibly due to medications impacting her ability to pay attention this date; reports intact sensation to touch) LUE Coordination: WNL    Lower Extremity Assessment Lower Extremity Assessment: Overall WFL for tasks assessed (hx of peripheral neuropathy with pt denying any new changes to sensation with touch in bil legs; Symmetrical 4+ to 5 strength grossly in bil legs; intact bil leg coordination)    Cervical / Trunk Assessment Cervical / Trunk Assessment: Kyphotic  Communication    Communication: No difficulties  Cognition Arousal/Alertness: Lethargic;Suspect due to medications Behavior During Therapy: [facility] for tasks assessed/performed Overall Cognitive Status: Within Functional Limits for tasks assessed                                 General Comments: Pt lethargic, likely due to medications, falling asleep on and off during session.      General Comments General comments (skin integrity, edema, etc.): VSS, RN cleared pt to be unhooked from monitor during session    Exercises     Assessment/Plan    PT Assessment Patient needs continued PT services  PT Problem List Decreased strength;Decreased activity tolerance;Decreased balance;Decreased mobility       PT Treatment Interventions DME instruction;Gait training;Functional mobility training;Therapeutic exercise;Therapeutic activities;Balance training;Neuromuscular re-education;Patient/family education    PT Goals (Current goals can be found in the Care Plan section)  Acute Rehab PT Goals Patient Stated Goal: to go home PT Goal Formulation: With patient/family Time For Goal Achievement: 10/26/20 Potential to Achieve Goals: Good    Frequency Min 4X/week   Barriers to discharge        Co-evaluation               AM-PAC PT "6 Clicks" Mobility  Outcome Measure Help needed turning from your back to your side while in a flat bed without using bedrails?: None Help needed moving from lying on your back to sitting on the side of a  flat bed without using bedrails?: None Help needed moving to and from a bed to a chair (including a wheelchair)?: A Little Help needed standing up from a chair using your arms (e.g., wheelchair or bedside chair)?: A Little Help needed to walk in hospital room?: A Little Help needed climbing 3-5 steps with a railing? : Total 6 Click Score: 18    End of Session Equipment Utilized During Treatment: Gait belt Activity Tolerance: Patient limited by lethargy;Patient  tolerated treatment well Patient left: in bed;with call bell/phone within reach;with bed alarm set;with SCD's reapplied;with family/visitor present Nurse Communication: Mobility status;Other (comment) (slight pink hue to bowel movement; lethargy) PT Visit Diagnosis: Unsteadiness on feet (R26.81);Other abnormalities of gait and mobility (R26.89);Muscle weakness (generalized) (M62.81)    Time: 5486-2824 PT Time Calculation (min) (ACUTE ONLY): 56 min   Charges:   PT Evaluation $PT Eval Low Complexity: 1 Low PT Treatments $Gait Training: 8-22 mins $Therapeutic Activity: 23-37 mins        Moishe Spice, PT, DPT Acute Rehabilitation Services  Pager: [phone] Office: [place] 10/12/2020, 4:39 PM

## 2020-10-13 DIAGNOSIS — I160 Hypertensive urgency: Secondary | ICD-10-CM | POA: Diagnosis not present

## 2020-10-13 LAB — COMPREHENSIVE METABOLIC PANEL
ALT: 14 U/L (ref 0–44)
AST: 17 U/L (ref 15–41)
Albumin: 3.5 g/dL (ref 3.5–5.0)
Alkaline Phosphatase: 62 U/L (ref 38–126)
Anion gap: 14 (ref 5–15)
BUN: 21 mg/dL (ref 8–23)
CO2: 18 mmol/L — ABNORMAL LOW (ref 22–32)
Calcium: 8.7 mg/dL — ABNORMAL LOW (ref 8.9–10.3)
Chloride: 104 mmol/L (ref 98–111)
Creatinine, Ser: 0.89 mg/dL (ref 0.44–1.00)
GFR, Estimated: >60 mL/min (ref >60)
Glucose, Bld: 325 mg/dL — ABNORMAL HIGH (ref 70–99)
Potassium: 4.8 mmol/L (ref 3.5–5.1)
Sodium: 136 mmol/L (ref 135–145)
Total Bilirubin: 2.2 mg/dL — ABNORMAL HIGH (ref 0.3–1.2)
Total Protein: 6.8 g/dL (ref 6.5–8.1)

## 2020-10-13 LAB — CBC WITH DIFFERENTIAL/PLATELET
Abs Immature Granulocytes: 0.09 10*3/uL — ABNORMAL HIGH (ref 0.00–0.07)
Basophils Absolute: 0 10*3/uL (ref 0.0–0.1)
Basophils Relative: 0 %
Eosinophils Absolute: 0 10*3/uL (ref 0.0–0.5)
Eosinophils Relative: 0 %
HCT: 45.7 % (ref 36.0–46.0)
Hemoglobin: 15.2 g/dL — ABNORMAL HIGH (ref 12.0–15.0)
Immature Granulocytes: 1 %
Lymphocytes Relative: 2 %
Lymphs Abs: 0.4 10*3/uL — ABNORMAL LOW (ref 0.7–4.0)
MCH: 29.3 pg (ref 26.0–34.0)
MCHC: 33.3 g/dL (ref 30.0–36.0)
MCV: 88.1 fL (ref 80.0–100.0)
Monocytes Absolute: 0.3 10*3/uL (ref 0.1–1.0)
Monocytes Relative: 2 %
Neutro Abs: 17.9 10*3/uL — ABNORMAL HIGH (ref 1.7–7.7)
Neutrophils Relative %: 95 %
Platelets: 226 10*3/uL (ref 150–400)
RBC: 5.19 MIL/uL — ABNORMAL HIGH (ref 3.87–5.11)
RDW: 12 % (ref 11.5–15.5)
WBC: 18.8 10*3/uL — ABNORMAL HIGH (ref 4.0–10.5)
nRBC: 0 % (ref 0.0–0.2)

## 2020-10-13 LAB — GLUCOSE, CAPILLARY
Glucose-Capillary: 333 mg/dL — ABNORMAL HIGH (ref 70–99)
Glucose-Capillary: 300 mg/dL — ABNORMAL HIGH (ref 70–99)
Glucose-Capillary: 310 mg/dL — ABNORMAL HIGH (ref 70–99)
Glucose-Capillary: 285 mg/dL — ABNORMAL HIGH (ref 70–99)
Glucose-Capillary: 349 mg/dL — ABNORMAL HIGH (ref 70–99)

## 2020-10-13 MED ORDER — INSULIN GLARGINE 100 UNIT/ML ~~LOC~~ SOLN
45.0000 [IU] | Freq: Every day | SUBCUTANEOUS | Status: DC
  Administered 2020-10-13: 45 [IU] via SUBCUTANEOUS
  Filled 2020-10-13: qty 0.45

## 2020-10-13 MED ORDER — INSULIN ASPART 100 UNIT/ML ~~LOC~~ SOLN
6.0000 [IU] | Freq: Three times a day (TID) | SUBCUTANEOUS | Status: DC
  Administered 2020-10-13 (×3): 6 [IU] via SUBCUTANEOUS

## 2020-10-13 MED ORDER — INSULIN ASPART 100 UNIT/ML ~~LOC~~ SOLN
0.0000 [IU] | Freq: Every day | SUBCUTANEOUS | Status: DC
  Administered 2020-10-13 – 2020-10-14 (×2): 4 [IU] via SUBCUTANEOUS
  Administered 2020-10-16: 2 [IU] via SUBCUTANEOUS
  Administered 2020-10-18: 3 [IU] via SUBCUTANEOUS

## 2020-10-13 MED ORDER — INSULIN GLARGINE 100 UNIT/ML ~~LOC~~ SOLN
50.0000 [IU] | Freq: Every day | SUBCUTANEOUS | Status: DC
  Administered 2020-10-14: 50 [IU] via SUBCUTANEOUS
  Filled 2020-10-13: qty 0.5

## 2020-10-13 MED ORDER — INSULIN GLARGINE 100 UNIT/ML ~~LOC~~ SOLN
5.0000 [IU] | Freq: Once | SUBCUTANEOUS | Status: AC
  Administered 2020-10-13: 5 [IU] via SUBCUTANEOUS
  Filled 2020-10-13: qty 0.05

## 2020-10-13 MED ORDER — LACTATED RINGERS IV SOLN: INTRAVENOUS | Status: AC

## 2020-10-13 MED ORDER — INSULIN ASPART 100 UNIT/ML ~~LOC~~ SOLN
0.0000 [IU] | Freq: Three times a day (TID) | SUBCUTANEOUS | Status: DC
  Administered 2020-10-13: 15 [IU] via SUBCUTANEOUS
  Administered 2020-10-13: 11 [IU] via SUBCUTANEOUS
  Administered 2020-10-14: 7 [IU] via SUBCUTANEOUS
  Administered 2020-10-14: 11 [IU] via SUBCUTANEOUS
  Administered 2020-10-15: 7 [IU] via SUBCUTANEOUS
  Administered 2020-10-15: 4 [IU] via SUBCUTANEOUS
  Administered 2020-10-15: 3 [IU] via SUBCUTANEOUS
  Administered 2020-10-16 (×2): 7 [IU] via SUBCUTANEOUS
  Administered 2020-10-16 – 2020-10-17 (×2): 11 [IU] via SUBCUTANEOUS
  Administered 2020-10-17: 4 [IU] via SUBCUTANEOUS
  Administered 2020-10-17: 7 [IU] via SUBCUTANEOUS
  Administered 2020-10-18 (×2): 4 [IU] via SUBCUTANEOUS
  Administered 2020-10-18 – 2020-10-19 (×2): 7 [IU] via SUBCUTANEOUS
  Administered 2020-10-19: 15 [IU] via SUBCUTANEOUS
  Administered 2020-10-20: 4 [IU] via SUBCUTANEOUS

## 2020-10-13 NOTE — Progress Notes (Addendum)
Subjective: Patient reports some mild head aching but no real pain. No acute events overnight. Doing well.   Objective: Vital signs in last 24 hours: Temp:  [97.6 F (36.4 C)-99 F (37.2 C)] 99 F (37.2 C) (03/12 0719) Pulse Rate:  [99-108] 104 (03/12 0719) Resp:  [15-23] 19 (03/12 0719) BP: (90-162)/(50-70) 162/63 (03/12 0719) SpO2:  [88 %-96 %] 95 % (03/12 0719)  Intake/Output from previous day: 03/11 0701 - 03/12 0700 In: 1712.9 [I.V.:1413; IV Piggyback:299.9] Out: 150 [Drains:150] Intake/Output this shift: No intake/output data recorded.  Neurologic: Grossly normal  Lab Results: Lab Results  Component Value Date   WBC 6.0 10/11/2020   HGB 15.3 (H) 10/11/2020   HCT 45.0 10/11/2020   MCV 87.8 10/11/2020   PLT 224 10/11/2020   Lab Results  Component Value Date   INR 1.1 10/09/2020   BMET Lab Results  Component Value Date   NA 137 10/11/2020   K 4.3 10/11/2020   CL 101 10/11/2020   CO2 27 10/11/2020   GLUCOSE 267 (H) 10/11/2020   BUN 17 10/11/2020   CREATININE 0.60 10/11/2020   CALCIUM 9.1 10/11/2020    Studies/Results: MR BRAIN W WO CONTRAST  Result Date: 10/12/2020 CLINICAL DATA:  Brain mass or lesion. EXAM: MRI HEAD WITHOUT AND WITH CONTRAST TECHNIQUE: Multiplanar, multiecho pulse sequences of the brain and surrounding structures were obtained without and with intravenous contrast. CONTRAST:  98mL GADAVIST GADOBUTROL 1 MMOL/ML IV SOLN COMPARISON:  MRI of the brain October 10, 2020 FINDINGS: The study is degraded by motion. Brain: Subjacent to the area of recent craniotomy, there is increased dural thickness and contrast enhancement in the anterior left middle cranial fossa extending along the sphenoid wing and tentorial incisura. Findings reflect postsurgical changes although residual tumor cannot be entirely excluded. Continued follow-up suggested. The left temporal lobe appear preserved. No acute infarction, hemorrhage or hydrocephalus. Vascular: Normal flow  voids. Skull and upper cervical spine: Postsurgical changes from left frontotemporal craniotomy. Marrow signal characteristics are otherwise maintained. Sinuses/Orbits: Bilateral lens surgery. Paranasal sinuses are clear. Other: Left mastoid effusion. IMPRESSION: 1. Increased dural thickness and contrast enhancement in the anterior left middle cranial fossa extending along the sphenoid wing and tentorial incisura. Findings reflect postsurgical changes although residual tumor cannot be entirely excluded. Continued follow-up suggested. 2. Left mastoid effusion. Electronically Signed   By: Pedro Earls M.D.   On: 10/12/2020 11:36    Assessment/Plan: 70 year old s/p crani for meningioma 3/10. Would like her to ambulate 3 times a day. Hemovac removed.   LOS: 3 days    Ocie Cornfield [facility] 10/13/2020, 8:18 AM

## 2020-10-13 NOTE — Evaluation (Signed)
Occupational Therapy Evaluation Patient Details Name: Regina Mckay MRN: [id number] DOB: [birth year] Today's Date: 10/13/2020    History of Present Illness Pt is a 70 y.o. female who presented 10/09/20 with R hand weakness and speech disturbance. Per chart, weakness in hand began 3 days PTA, waxing and waning. MRI revealed dural based lesion in the L middle cranial fossa most consistent with meningioma and causing mild displacement of adjacent temporal lobe. EEG suggestive of nonspecific cortical dysfunction in L temporal region. 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 PMH: DM2, HTN, BMI 41.   Clinical Impression   Patient is s/p crani s/p L temporal meningioma resection surgery resulting in functional limitations due to the deficits listed below (see OT problem list). Pt currently with mild balance deficits and pathfinding challenges. Pt was able to problem solve with min cues and increased time back to room. Pt is able to recall long term memory with brisk response. Ot to visit 1 additional session for executive cognitive functional assessment such as pill box.  Patient will benefit from skilled OT acutely to increase independence and safety with ADLS to allow discharge home.     Follow Up Recommendations  No OT follow up    Equipment Recommendations  None recommended by OT    Recommendations for Other Services       Precautions / Restrictions Precautions Precautions: Fall Precaution Comments: hemovac removed ( bandage only present)      Mobility Bed Mobility Overal bed mobility: Modified Independent                  Transfers Overall transfer level: Needs assistance   Transfers: Sit to/from Stand Sit to Stand: Supervision              Balance           Standing balance support: During functional activity Standing balance-Leahy Scale: Fair                             ADL either performed or assessed with  clinical judgement   ADL Overall ADL's : Needs assistance/impaired   Eating/Feeding Details (indicate cue type and reason): ordered her own lunch with cue from therapist Grooming: Wash/dry face;Wash/dry hands;Supervision/safety;Standing                 Lower Body Dressing Details (indicate cue type and reason): able to reach feet with hip flexion at eob. pt is unable to figure 4 cross. pt declines any pain with head neck flexion changes Toilet Transfer: Supervision/safety   Toileting- Clothing Manipulation and Hygiene: Supervision/safety       Functional mobility during ADLs: Supervision/safety General ADL Comments: pt with good recall. daughter reports patient was verbalizing events from Thursday as "yesterday" but all the information was accurate. daughter also notes that she slept the entire day Friday     Vision   Vision Assessment?: No apparent visual deficits     Perception     Praxis      Pertinent Vitals/Pain Pain Assessment: No/denies pain     Hand Dominance Right   Extremity/Trunk Assessment Upper Extremity Assessment Upper Extremity Assessment: RUE deficits/detail RUE Deficits / Details: able to utilize for peri care and hand hygiene this session. pt with continued use will regain strength   Lower Extremity Assessment Lower Extremity Assessment: Overall WFL for tasks assessed   Cervical / Trunk Assessment Cervical / Trunk Assessment: Kyphotic  Communication Communication Communication: No difficulties   Cognition Arousal/Alertness: Awake/alert Behavior During Therapy: WFL for tasks assessed/performed Overall Cognitive Status: Within Functional Limits for tasks assessed                                 General Comments: able to name >13 animals in 1 minutes time. pt reports no trouble word finding now. Pt reports that she was having trouble expressing her words but that is not happening   General Comments  VSS    Exercises      Shoulder Instructions      Home Living Family/patient expects to be discharged to:: Private residence Living Arrangements: Alone Available Help at Discharge: Family;Available 24 hours/day Type of Home: Apartment Home Access: Level entry     Home Layout: One level     Bathroom Shower/Tub: Teacher, early years/pre: Standard     Home Equipment: Grab bars - tub/shower;Hand held shower head;Cane - single point   Additional Comments: daughter 72 year grandaughter present has 10 yo granddaughter as well      Prior Functioning/Environment Level of Independence: Independent        Comments: Pt driving, cooking, cleaning, and mobile independently without AD/AE. Pt does not negotiate stairs.        OT Problem List: Decreased activity tolerance;Impaired balance (sitting and/or standing)      OT Treatment/Interventions: Self-care/ADL training;Cognitive remediation/compensation;Balance training    OT Goals(Current goals can be found in the care plan section) Acute Rehab OT Goals Patient Stated Goal: to go home OT Goal Formulation: With patient Time For Goal Achievement: 10/27/20 Potential to Achieve Goals: Good  OT Frequency: Other (comment) (1 more visit)   Barriers to D/C:            Co-evaluation              AM-PAC OT "6 Clicks" Daily Activity     Outcome Measure Help from another person eating meals?: None Help from another person taking care of personal grooming?: None Help from another person toileting, which includes using toliet, bedpan, or urinal?: A Little Help from another person bathing (including washing, rinsing, drying)?: A Little Help from another person to put on and taking off regular upper body clothing?: None Help from another person to put on and taking off regular lower body clothing?: A Little 6 Click Score: 21   End of Session Nurse Communication: Mobility status;Precautions  Activity Tolerance: Patient tolerated treatment  well Patient left: in bed;with call bell/phone within reach;with family/visitor present  OT Visit Diagnosis: Unsteadiness on feet (R26.81)                Time: 6378-5885 OT Time Calculation (min): 39 min Charges:  OT General Charges $OT Visit: 1 Visit OT Evaluation $OT Eval Moderate Complexity: 1 Mod OT Treatments $Self Care/Home Management : 8-22 mins   Brynn, OTR/L  Acute Rehabilitation Services Pager: [phone] Office: [phone] .   Jeri Modena 10/13/2020, 3:13 PM

## 2020-10-13 NOTE — Progress Notes (Signed)
PROGRESS NOTE    Regina Mckay  GEX:[id number] DOB: [birth year] DOA: 10/09/2020 PCP: Regina Gravel, MD   Chief Complaint  Patient presents with  . stroke like s/s    Brief Narrative:   Regina Mckay is Regina Mckay very pleasant right-handed 70 y.o. female with medical history significant for type 2 diabetes mellitus, hypertension, and BMI 60, now presenting to the emergency department for evaluation of right hand weakness and speech disturbance.   Imaging notable for dural based lesion of the left middle cranial fossal most c/w meningioma.  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  Neurosurgery c/s pending.    Assessment & Plan:   Principal Problem:   Episode of transient neurologic symptoms Active Problems:   Uncontrolled diabetes mellitus with hyperglycemia (HCC)   Polycythemia   Hypertensive urgency   Speech abnormality  1. 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  - continue keppra 500 mg BID  -  S/p craniotomy for resection of meningioma, application of cranial navigation 3/11 - MRI 3/11 follow up with increased dural thickness and contrast enhancement in the anterior left middle cranial fossa extending along the sphenoid wing and tentorial incisura - postsurgical changes - neurosurgery c/s, appreciate recs  - transfer to progressive today if able - EEG without seizure or epileptiform discharges  2. Hypertensive urgency  - BP as high as 250/110 in ED in setting of anxiety and running out of her antihypertensives  - wean cardene as able - per discussion with neurosurgery, BP goal < 010 systolic - restart lisinopril, continue amlodipine (adjust prn) - prn enalaprilat and labetalol   3. Uncontrolled type II DM  - A1c 13.6 -  lantus, basal, bolus, SSI - adjust prn (will have hyperglycemia with steroids)  4. Polycythemia  - Hgb 17.9 on admission  - Patient not hypovolemic, denies smoking or lung disease, denies chest pain, and no CVA on MRI  - Check serum epo (elevated, will consider discussion with heme) - workup further outpatient  Hasn't seen her PCP for several years, will probably need refills of most meds at discharged.  She's got appt with new doc in practice.   DVT prophylaxis: SCD Code Status: full  Family Communication: daughter at bedside, granddaughter  Disposition:   Status is: Observation  The patient will require care spanning > 2 midnights and should be moved to inpatient because: Inpatient level of care appropriate due to severity of illness  Dispo: The patient is from: Home              Anticipated d/c is to: Home              Patient currently is not medically stable to d/c.   Difficult to place patient No       Consultants:   Neurology  neurosurgery  Procedures:  EEG pending  Antimicrobials: Anti-infectives (From admission, onward)   Start     Dose/Rate Route Frequency Ordered Stop   10/12/20 0300  vancomycin (VANCOREADY) IVPB 750 mg/150 mL        750 mg 150 mL/hr over 60 Minutes Intravenous Every 12 hours 10/11/20 2057 10/13/20 0422   10/11/20 0600  vancomycin (VANCOREADY) IVPB 1500 mg/300 mL  Status:  Discontinued        1,500 mg  150 mL/hr over 120 Minutes Intravenous On call to O.R. 10/10/20 1319 10/11/20 2017         Subjective: No new complaints No recurrent episodes  Objective: Vitals:   10/13/20 0311 10/13/20 0719 10/13/20 1106 10/13/20 1511  BP: (!) 141/63 (!) 162/63 (!) 165/72 (!) 125/58  Pulse: (!) 102 (!) 104 (!) 105 95  Resp: 20 19 15 19   Temp: 97.8 F (36.6 C) 99 F (37.2 C) 98.4 F (36.9 C) 98.1 F (36.7 C)  TempSrc: Axillary Oral Oral Oral  SpO2: 95% 95% 98% 96%  Weight:      Height:        Intake/Output Summary (Last 24 hours) at  10/13/2020 1835 Last data filed at 10/13/2020 0500 Gross per 24 hour  Intake 470.07 ml  Output --  Net 470.07 ml   Filed Weights   10/09/20 1401  Weight: 108 kg    Examination:  General: No acute distress. Dressing to surgical site  Cardiovascular: Heart sounds show Regina Mckay regular rate, and rhythm Lungs: Clear to auscultation bilaterally Abdomen: Soft, nontender, nondistended  Neurological: Alert and oriented 3. Moves all extremities 4 . Cranial nerves II through XII grossly intact. Skin: Warm and dry. No rashes or lesions. Extremities: No clubbing or cyanosis. No edema.   Data Reviewed: I have personally reviewed following labs and imaging studies  CBC: Recent Labs  Lab 10/09/20 1337 10/10/20 0443 10/11/20 0603 10/11/20 1827 10/11/20 1833 10/13/20 0921  WBC 7.8 6.1 6.0  --   --  18.8*  NEUTROABS 5.5  --  4.0  --   --  17.9*  HGB 17.9* 17.2* 17.3* 15.3* 15.3* 15.2*  HCT 53.9* 51.2* 52.6* 45.0 45.0 45.7  MCV 86.4 86.2 87.8  --   --  88.1  PLT 218 203 224  --   --  809    Basic Metabolic Panel: Recent Labs  Lab 10/09/20 1337 10/10/20 0443 10/11/20 0603 10/11/20 1827 10/11/20 1833 10/13/20 0921  NA 133* 133* 135 136 137 136  K 3.8 3.8 4.3 4.3 4.3 4.8  CL 94* 96* 99  --  101 104  CO2 24 25 27   --   --  18*  GLUCOSE 416* 325* 385*  --  267* 325*  BUN 9 10 16   --  17 21  CREATININE 0.89 0.80 0.88  --  0.60 0.89  CALCIUM 9.4 9.5 9.1  --   --  8.7*  MG  --   --  1.8  --   --   --   PHOS  --   --  4.6  --   --   --     GFR: Estimated Creatinine Clearance: 69.6 mL/min (by C-G formula based on SCr of 0.89 mg/dL).  Liver Function Tests: Recent Labs  Lab 10/09/20 1337 10/11/20 0603 10/13/20 0921  AST 17 13* 17  ALT 18 16 14   ALKPHOS 67 52 62  BILITOT 1.4* 1.4* 2.2*  PROT 7.7 6.5 6.8  ALBUMIN 4.1 3.3* 3.5    CBG: Recent Labs  Lab 10/12/20 2055 10/13/20 0338 10/13/20 0830 10/13/20 1222 10/13/20 1743  GLUCAP 287* 333* 300* 310* 285*     Recent  Results (from the past 240 hour(s))  Resp Panel by RT-PCR (Flu Othar Curto&B, Covid) Nasopharyngeal Swab     Status: None   Collection Time: 10/09/20  9:09 PM   Specimen: Nasopharyngeal Swab; Nasopharyngeal(NP) swabs in vial transport medium  Result Value Ref Range Status   SARS Coronavirus 2 by  RT PCR NEGATIVE NEGATIVE Final    Comment: (NOTE) SARS-CoV-2 target nucleic acids are NOT DETECTED.  The SARS-CoV-2 RNA is generally detectable in upper respiratory specimens during the acute phase of infection. The lowest concentration of SARS-CoV-2 viral copies this assay can detect is 138 copies/mL. Consuella Scurlock negative result does not preclude SARS-Cov-2 infection and should not be used as the sole basis for treatment or other patient management decisions. Aamna Mallozzi negative result may occur with  improper specimen collection/handling, submission of specimen other than nasopharyngeal swab, presence of viral mutation(s) within the areas targeted by this assay, and inadequate number of viral copies(<138 copies/mL). Arrow Tomko negative result must be combined with clinical observations, patient history, and epidemiological information. The expected result is Negative.  Fact Sheet for Patients:  EntrepreneurPulse.com.au  Fact Sheet for Healthcare Providers:  IncredibleEmployment.be  This test is no t yet approved or cleared by the [place] FDA and  has been authorized for detection and/or diagnosis of SARS-CoV-2 by FDA under an Emergency Use Authorization (EUA). This EUA will remain  in effect (meaning this test can be used) for the duration of the COVID-19 declaration under Section 564(b)(1) of the Act, 21 U.S.C.section 360bbb-3(b)(1), unless the authorization is terminated  or revoked sooner.       Influenza Rigel Filsinger by PCR NEGATIVE NEGATIVE Final   Influenza B by PCR NEGATIVE NEGATIVE Final    Comment: (NOTE) The Xpert Xpress SARS-CoV-2/FLU/RSV plus assay is intended as an aid in the  diagnosis of influenza from Nasopharyngeal swab specimens and should not be used as Maxene Byington sole basis for treatment. Nasal washings and aspirates are unacceptable for Xpert Xpress SARS-CoV-2/FLU/RSV testing.  Fact Sheet for Patients: EntrepreneurPulse.com.au  Fact Sheet for Healthcare Providers: IncredibleEmployment.be  This test is not yet approved or cleared by the [place] FDA and has been authorized for detection and/or diagnosis of SARS-CoV-2 by FDA under an Emergency Use Authorization (EUA). This EUA will remain in effect (meaning this test can be used) for the duration of the COVID-19 declaration under Section 564(b)(1) of the Act, 21 U.S.C. section 360bbb-3(b)(1), unless the authorization is terminated or revoked.  Performed at [facility], [address] [place], [place] [address]          Radiology Studies: MR BRAIN W WO CONTRAST  Result Date: 10/12/2020 CLINICAL DATA:  Brain mass or lesion. EXAM: MRI HEAD WITHOUT AND WITH CONTRAST TECHNIQUE: Multiplanar, multiecho pulse sequences of the brain and surrounding structures were obtained without and with intravenous contrast. CONTRAST:  66mL GADAVIST GADOBUTROL 1 MMOL/ML IV SOLN COMPARISON:  MRI of the brain October 10, 2020 FINDINGS: The study is degraded by motion. Brain: Subjacent to the area of recent craniotomy, there is increased dural thickness and contrast enhancement in the anterior left middle cranial fossa extending along the sphenoid wing and tentorial incisura. Findings reflect postsurgical changes although residual tumor cannot be entirely excluded. Continued follow-up suggested. The left temporal lobe appear preserved. No acute infarction, hemorrhage or hydrocephalus. Vascular: Normal flow voids. Skull and upper cervical spine: Postsurgical changes from left frontotemporal craniotomy. Marrow signal characteristics are otherwise maintained. Sinuses/Orbits: Bilateral lens  surgery. Paranasal sinuses are clear. Other: Left mastoid effusion. IMPRESSION: 1. Increased dural thickness and contrast enhancement in the anterior left middle cranial fossa extending along the sphenoid wing and tentorial incisura. Findings reflect postsurgical changes although residual tumor cannot be entirely excluded. Continued follow-up suggested. 2. Left mastoid effusion. Electronically Signed   By: Sandre Kitty.D.  On: 10/12/2020 11:36        Scheduled Meds: . amLODipine  10 mg Oral Daily  . Chlorhexidine Gluconate Cloth  6 each Topical Daily  . [START ON 10/14/2020] dexamethasone  4 mg Oral Q8H   Followed by  . [START ON 10/15/2020] dexamethasone  2 mg Oral Q8H   Followed by  . [START ON 10/17/2020] dexamethasone  2 mg Oral Q12H   Followed by  . [START ON 10/19/2020] dexamethasone  1 mg Oral Q12H   Followed by  . [START ON 10/22/2020] dexamethasone  1 mg Oral Daily  . docusate sodium  100 mg Oral BID  . heparin injection (subcutaneous)  5,000 Units Subcutaneous Q8H  . insulin aspart  0-20 Units Subcutaneous TID WC  . insulin aspart  0-5 Units Subcutaneous QHS  . insulin aspart  6 Units Subcutaneous TID WC  . insulin glargine  45 Units Subcutaneous Daily  . levETIRAcetam  500 mg Oral BID  . lisinopril  20 mg Oral Daily  . pantoprazole  40 mg Oral Daily  . pravastatin  40 mg Oral q1800   Continuous Infusions: . 0.9 % NaCl with KCl 20 mEq / L Stopped (10/13/20 1749)  . niCARDipine Stopped (10/12/20 1539)     LOS: 3 days    Time spent: over 30 min    Fayrene Helper, MD [facility]   To contact the attending provider between 7A-7P or the covering provider during after hours 7P-7A, please log into the web site [email or web address] and access using universal [place] password for that web site. If you do not have the password, please call the hospital operator.  10/13/2020, 6:35 PM

## 2020-10-14 DIAGNOSIS — I160 Hypertensive urgency: Secondary | ICD-10-CM | POA: Diagnosis not present

## 2020-10-14 LAB — CBC WITH DIFFERENTIAL/PLATELET
Abs Immature Granulocytes: 0.07 10*3/uL (ref 0.00–0.07)
Basophils Absolute: 0 10*3/uL (ref 0.0–0.1)
Basophils Relative: 0 %
Eosinophils Absolute: 0 10*3/uL (ref 0.0–0.5)
Eosinophils Relative: 0 %
HCT: 40.2 % (ref 36.0–46.0)
Hemoglobin: 13.6 g/dL (ref 12.0–15.0)
Immature Granulocytes: 1 %
Lymphocytes Relative: 3 %
Lymphs Abs: 0.3 10*3/uL — ABNORMAL LOW (ref 0.7–4.0)
MCH: 29.2 pg (ref 26.0–34.0)
MCHC: 33.8 g/dL (ref 30.0–36.0)
MCV: 86.5 fL (ref 80.0–100.0)
Monocytes Absolute: 0.5 10*3/uL (ref 0.1–1.0)
Monocytes Relative: 3 %
Neutro Abs: 12.4 10*3/uL — ABNORMAL HIGH (ref 1.7–7.7)
Neutrophils Relative %: 93 %
Platelets: 213 10*3/uL (ref 150–400)
RBC: 4.65 MIL/uL (ref 3.87–5.11)
RDW: 12 % (ref 11.5–15.5)
WBC: 13.2 10*3/uL — ABNORMAL HIGH (ref 4.0–10.5)
nRBC: 0 % (ref 0.0–0.2)

## 2020-10-14 LAB — COMPREHENSIVE METABOLIC PANEL
ALT: 14 U/L (ref 0–44)
AST: 15 U/L (ref 15–41)
Albumin: 3.1 g/dL — ABNORMAL LOW (ref 3.5–5.0)
Alkaline Phosphatase: 51 U/L (ref 38–126)
Anion gap: 8 (ref 5–15)
BUN: 29 mg/dL — ABNORMAL HIGH (ref 8–23)
CO2: 25 mmol/L (ref 22–32)
Calcium: 8.4 mg/dL — ABNORMAL LOW (ref 8.9–10.3)
Chloride: 103 mmol/L (ref 98–111)
Creatinine, Ser: 0.81 mg/dL (ref 0.44–1.00)
GFR, Estimated: >60 mL/min (ref >60)
Glucose, Bld: 292 mg/dL — ABNORMAL HIGH (ref 70–99)
Potassium: 4.2 mmol/L (ref 3.5–5.1)
Sodium: 136 mmol/L (ref 135–145)
Total Bilirubin: 2.5 mg/dL — ABNORMAL HIGH (ref 0.3–1.2)
Total Protein: 6.2 g/dL — ABNORMAL LOW (ref 6.5–8.1)

## 2020-10-14 LAB — GLUCOSE, CAPILLARY
Glucose-Capillary: 209 mg/dL — ABNORMAL HIGH (ref 70–99)
Glucose-Capillary: 212 mg/dL — ABNORMAL HIGH (ref 70–99)
Glucose-Capillary: 233 mg/dL — ABNORMAL HIGH (ref 70–99)
Glucose-Capillary: 234 mg/dL — ABNORMAL HIGH (ref 70–99)
Glucose-Capillary: 239 mg/dL — ABNORMAL HIGH (ref 70–99)
Glucose-Capillary: 275 mg/dL — ABNORMAL HIGH (ref 70–99)
Glucose-Capillary: 302 mg/dL — ABNORMAL HIGH (ref 70–99)
Glucose-Capillary: 303 mg/dL — ABNORMAL HIGH (ref 70–99)
Glucose-Capillary: 308 mg/dL — ABNORMAL HIGH (ref 70–99)
Glucose-Capillary: 321 mg/dL — ABNORMAL HIGH (ref 70–99)

## 2020-10-14 LAB — MAGNESIUM: Magnesium: 2 mg/dL (ref 1.7–2.4)

## 2020-10-14 LAB — PHOSPHORUS: Phosphorus: 2.6 mg/dL (ref 2.5–4.6)

## 2020-10-14 MED ORDER — INSULIN GLARGINE 100 UNIT/ML ~~LOC~~ SOLN
10.0000 [IU] | Freq: Once | SUBCUTANEOUS | Status: AC
  Administered 2020-10-14: 10 [IU] via SUBCUTANEOUS
  Filled 2020-10-14: qty 0.1

## 2020-10-14 MED ORDER — HYDROCODONE-ACETAMINOPHEN 5-325 MG PO TABS
1.0000 | ORAL_TABLET | Freq: Four times a day (QID) | ORAL | Status: DC | PRN
  Administered 2020-10-14: 1 via ORAL
  Administered 2020-10-16 – 2020-10-18 (×4): 2 via ORAL
  Filled 2020-10-14: qty 2
  Filled 2020-10-14 (×2): qty 1
  Filled 2020-10-14 (×3): qty 2

## 2020-10-14 MED ORDER — INSULIN GLARGINE 100 UNIT/ML ~~LOC~~ SOLN
60.0000 [IU] | Freq: Every day | SUBCUTANEOUS | Status: DC
  Administered 2020-10-15 – 2020-10-16 (×2): 60 [IU] via SUBCUTANEOUS
  Filled 2020-10-14 (×2): qty 0.6

## 2020-10-14 MED ORDER — HYDROCHLOROTHIAZIDE 12.5 MG PO CAPS
12.5000 mg | ORAL_CAPSULE | Freq: Every day | ORAL | Status: DC
  Administered 2020-10-14 – 2020-10-20 (×7): 12.5 mg via ORAL
  Filled 2020-10-14 (×7): qty 1

## 2020-10-14 MED ORDER — INSULIN ASPART 100 UNIT/ML ~~LOC~~ SOLN
10.0000 [IU] | Freq: Three times a day (TID) | SUBCUTANEOUS | Status: DC
  Administered 2020-10-15 – 2020-10-16 (×4): 10 [IU] via SUBCUTANEOUS

## 2020-10-14 NOTE — Progress Notes (Signed)
Physical Therapy Treatment Patient Details Name: Regina Mckay MRN: [id number] DOB: 04-02-1951 Today's Date: 10/14/2020    History of Present Illness Pt is a 70 y.o. female who presented 10/09/20 with R hand weakness and speech disturbance. Per chart, weakness in hand began 3 days PTA, waxing and waning. MRI revealed dural based lesion in the L middle cranial fossa most consistent with meningioma and causing mild displacement of adjacent temporal lobe. EEG suggestive of nonspecific cortical dysfunction in L temporal region. 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 PMH: DM2, HTN, BMI 41.    PT Comments    Pt progressing towards her physical therapy goals, demonstrating improved pain control and activity tolerance. Ambulating x 400 feet with no assistive device and negotiated 2 steps without physical difficulty. Able to perform head turns and step over obstacles without loss of balance, but has to slow down to perform. Overall, she demonstrates decreased gait speed, dynamic balance deficits, and impairments noted in cognitive executive functioning (I.e. increased time for processing). Recommending continued OPPT (neuro) to address deficits and maximize functional independence.    Follow Up Recommendations  Supervision for mobility/OOB;Outpatient PT (neuro)     Equipment Recommendations  None recommended by PT    Recommendations for Other Services       Precautions / Restrictions Precautions Precautions: Fall Restrictions Weight Bearing Restrictions: No    Mobility  Bed Mobility               General bed mobility comments: Sitting EOB upon arrival    Transfers Overall transfer level: Needs assistance   Transfers: Sit to/from Stand Sit to Stand: Supervision            Ambulation/Gait Ambulation/Gait assistance: Supervision Gait Distance (Feet): 400 Feet Assistive device: None Gait Pattern/deviations: Step-through pattern;Decreased  stride length;Wide base of support Gait velocity: decreased   General Gait Details: Increased lateral sway, slow pace, no overt LOB   Stairs Stairs: Yes Stairs assistance: Min guard Stair Management: One rail Right Number of Stairs: 2 General stair comments: cues for step by step   Wheelchair Mobility    Modified Rankin (Stroke Patients Only)       Balance Overall balance assessment: Mild deficits observed, not formally tested                                          Cognition Arousal/Alertness: Awake/alert Behavior During Therapy: WFL for tasks assessed/performed Overall Cognitive Status: Impaired/Different from baseline Area of Impairment: Problem solving                             Problem Solving: Slow processing General Comments: Increased time to process      Exercises      General Comments        Pertinent Vitals/Pain Pain Assessment: Faces Faces Pain Scale: Hurts little more Pain Location: head Pain Descriptors / Indicators: Headache Pain Intervention(s): Monitored during session    Home Living                      Prior Function            PT Goals (current goals can now be found in the care plan section) Acute Rehab PT Goals Patient Stated Goal: to go home Potential to Achieve Goals: Good Progress towards  PT goals: Progressing toward goals    Frequency    Min 4X/week      PT Plan Discharge plan needs to be updated    Co-evaluation              AM-PAC PT "6 Clicks" Mobility   Outcome Measure  Help needed turning from your back to your side while in a flat bed without using bedrails?: None Help needed moving from lying on your back to sitting on the side of a flat bed without using bedrails?: None Help needed moving to and from a bed to a chair (including a wheelchair)?: None Help needed standing up from a chair using your arms (e.g., wheelchair or bedside chair)?: None Help needed to  walk in hospital room?: None Help needed climbing 3-5 steps with a railing? : A Little 6 Click Score: 23    End of Session   Activity Tolerance: Patient tolerated treatment well Patient left: in bed;with call bell/phone within reach Nurse Communication: Mobility status PT Visit Diagnosis: Unsteadiness on feet (R26.81);Other abnormalities of gait and mobility (R26.89);Muscle weakness (generalized) (M62.81)     Time: 5701-7793 PT Time Calculation (min) (ACUTE ONLY): 27 min  Charges:  $Therapeutic Activity: 23-37 mins                     Wyona Almas, PT, DPT Acute Rehabilitation Services Pager [phone] Office [phone]    Deno Etienne 10/14/2020, 5:01 PM

## 2020-10-14 NOTE — Progress Notes (Addendum)
PROGRESS NOTE    Regina Mckay  POE:[id number] DOB: 06-Sep-1950 DOA: 10/09/2020 PCP: Regina Gravel, MD   Chief Complaint  Patient presents with  . stroke like s/s    Brief Narrative:   Regina Mckay is Regina Mckay very pleasant right-handed 70 y.o. female with medical history significant for type 2 diabetes mellitus, hypertension, and BMI 38, now presenting to the emergency department for evaluation of right hand weakness and speech disturbance.   Imaging notable for dural based lesion of the left middle cranial fossal most c/w meningioma.  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  Neurosurgery c/s pending.    Assessment & Plan:   Principal Problem:   Episode of transient neurologic symptoms Active Problems:   Uncontrolled diabetes mellitus with hyperglycemia (HCC)   Polycythemia   Hypertensive urgency   Speech abnormality  1. 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  - continue keppra 500 mg BID  -  S/p craniotomy for resection of meningioma, application of cranial navigation 3/11 - MRI 3/11 follow up with increased dural thickness and contrast enhancement in the anterior left middle cranial fossa extending along the sphenoid wing and tentorial incisura - postsurgical changes - neurosurgery c/s, appreciate recs - steroid taper - EEG without seizure or epileptiform discharges  # Delirium:  Mild, follow, likely post op. Delirium precautions, w/u further as indicated  2. Hypertensive urgency  - BP as high as 250/110 in ED in setting of anxiety and running out of her antihypertensives  - BP goal < 315 systolic - restart lisinopril, continue amlodipine - BP still on high side, add HCTZ 12.5 mg (adjust prn) - prn enalaprilat and  labetalol   3. Uncontrolled type II DM  - A1c 13.6 - lantus, basal, bolus, SSI - adjust prn (worsened hyperglycemia with steroids, follow with taper)  4. Polycythemia  - Hgb 17.9 on admission - downtrending  - Patient not hypovolemic, denies smoking or lung disease, denies chest pain, and no CVA on MRI  - Check serum epo -- elevated - recommending heme follow up outpatient - workup further outpatient  Hasn't seen her PCP for several years, will probably need refills of most meds at discharged.  She's got appt with new doc in practice.   DVT prophylaxis: SCD Code Status: full  Family Communication: none at bedside - called daughter, no answer Disposition:   Status is: Observation  The patient will require care spanning > 2 midnights and should be moved to inpatient because: Inpatient level of care appropriate due to severity of illness  Dispo: The patient is from: Home              Anticipated d/c is to: Home              Patient currently is not medically stable to d/c.   Difficult to place patient No       Consultants:   Neurology  neurosurgery  Procedures:  EEG pending  Antimicrobials: Anti-infectives (From admission, onward)   Start     Dose/Rate Route Frequency Ordered Stop   10/12/20 0300  vancomycin (VANCOREADY) IVPB 750 mg/150 mL        750 mg 150 mL/hr over 60 Minutes Intravenous Every 12 hours 10/11/20 2057 10/13/20 0422   10/11/20 0600  vancomycin (VANCOREADY) IVPB 1500 mg/300 mL  Status:  Discontinued        1,500 mg 150 mL/hr over 120 Minutes Intravenous On call to O.R. 10/10/20 1319 10/11/20 2017         Subjective: Pain overnight  Objective: Vitals:   10/14/20 0441 10/14/20 0720 10/14/20 1322 10/14/20 1500  BP: (!) 147/70 (!) 149/67 (!) 172/66 (!) 165/70  Pulse: 77 88 87 91  Resp: 16 18 15 18   Temp: 98.2 F (36.8 C) 98.4 F (36.9 C) 98.7 F (37.1 C) 98.1 F (36.7 C)  TempSrc: Oral Oral Oral Oral  SpO2: 96% 97% 97% 97%  Weight:       Height:        Intake/Output Summary (Last 24 hours) at 10/14/2020 1714 Last data filed at 10/14/2020 1500 Gross per 24 hour  Intake 1129.52 ml  Output --  Net 1129.52 ml   Filed Weights   10/09/20 1401  Weight: 108 kg    Examination:  General: No acute distress. HEENT: dressing intact Cardiovascular: Heart sounds show [name] regular rate, and rhythm.  Lungs: Clear to auscultation bilaterally . Abdomen: Soft, nontender, nondistended Neurological: Alert and oriented 3. Moves all extremities 4h. Cranial nerves II through XII grossly intact. Skin: Warm and dry. No rashes or lesions. Extremities: No clubbing or cyanosis. No edema.   Data Reviewed: I have personally reviewed following labs and imaging studies  CBC: Recent Labs  Lab 10/09/20 1337 10/10/20 0443 10/11/20 0603 10/11/20 1827 10/11/20 1833 10/13/20 0921 10/14/20 0410  WBC 7.8 6.1 6.0  --   --  18.8* 13.2*  NEUTROABS 5.5  --  4.0  --   --  17.9* 12.4*  HGB 17.9* 17.2* 17.3* 15.3* 15.3* 15.2* 13.6  HCT 53.9* 51.2* 52.6* 45.0 45.0 45.7 40.2  MCV 86.4 86.2 87.8  --   --  88.1 86.5  PLT 218 203 224  --   --  226 616    Basic Metabolic Panel: Recent Labs  Lab 10/09/20 1337 10/10/20 0443 10/11/20 0603 10/11/20 1827 10/11/20 1833 10/13/20 0921 10/14/20 0410  NA 133* 133* 135 136 137 136 136  K 3.8 3.8 4.3 4.3 4.3 4.8 4.2  CL 94* 96* 99  --  101 104 103  CO2 24 25 27   --   --  18* 25  GLUCOSE 416* 325* 385*  --  267* 325* 292*  BUN 9 10 16   --  17 21 29*  CREATININE 0.89 0.80 0.88  --  0.60 0.89 0.81  CALCIUM 9.4 9.5 9.1  --   --  8.7* 8.4*  MG  --   --  1.8  --   --   --  2.0  PHOS  --   --  4.6  --   --   --  2.6    GFR: Estimated Creatinine Clearance: 76.4 mL/min (by C-G formula based on SCr of 0.81 mg/dL).  Liver Function Tests: Recent Labs  Lab 10/09/20 1337 10/11/20 0603 10/13/20 0921 10/14/20 0410  AST 17 13* 17 15  ALT 18 16 14 14   ALKPHOS 67 52 62 51  BILITOT 1.4* 1.4* 2.2* 2.5*   PROT 7.7 6.5 6.8 6.2*  ALBUMIN 4.1 3.3* 3.5 3.1*    CBG: Recent Labs  Lab 10/14/20 0440 10/14/20 0729 10/14/20 1246 10/14/20 1405 10/14/20 1502  GLUCAP 303* 321* 239* 234* 209*     Recent Results (from the past 240 hour(s))  Resp Panel by RT-PCR (Flu Brandilyn Nanninga&B, Covid) Nasopharyngeal  Swab     Status: None   Collection Time: 10/09/20  9:09 PM   Specimen: Nasopharyngeal Swab; Nasopharyngeal(NP) swabs in vial transport medium  Result Value Ref Range Status   SARS Coronavirus 2 by RT PCR NEGATIVE NEGATIVE Final    Comment: (NOTE) SARS-CoV-2 target nucleic acids are NOT DETECTED.  The SARS-CoV-2 RNA is generally detectable in upper respiratory specimens during the acute phase of infection. The lowest concentration of SARS-CoV-2 viral copies this assay can detect is 138 copies/mL. Jahrel Borthwick negative result does not preclude SARS-Cov-2 infection and should not be used as the sole basis for treatment or other patient management decisions. Jewell Ryans negative result may occur with  improper specimen collection/handling, submission of specimen other than nasopharyngeal swab, presence of viral mutation(s) within the areas targeted by this assay, and inadequate number of viral copies(<138 copies/mL). Marrie Chandra negative result must be combined with clinical observations, patient history, and epidemiological information. The expected result is Negative.  Fact Sheet for Patients:  EntrepreneurPulse.com.au  Fact Sheet for Healthcare Providers:  IncredibleEmployment.be  This test is no t yet approved or cleared by the [place] FDA and  has been authorized for detection and/or diagnosis of SARS-CoV-2 by FDA under an Emergency Use Authorization (EUA). This EUA will remain  in effect (meaning this test can be used) for the duration of the COVID-19 declaration under Section 564(b)(1) of the Act, 21 U.S.C.section 360bbb-3(b)(1), unless the authorization is terminated  or revoked  sooner.       Influenza Mirela Parsley by PCR NEGATIVE NEGATIVE Final   Influenza B by PCR NEGATIVE NEGATIVE Final    Comment: (NOTE) The Xpert Xpress SARS-CoV-2/FLU/RSV plus assay is intended as an aid in the diagnosis of influenza from Nasopharyngeal swab specimens and should not be used as Darielys Giglia sole basis for treatment. Nasal washings and aspirates are unacceptable for Xpert Xpress SARS-CoV-2/FLU/RSV testing.  Fact Sheet for Patients: EntrepreneurPulse.com.au  Fact Sheet for Healthcare Providers: IncredibleEmployment.be  This test is not yet approved or cleared by the [place] FDA and has been authorized for detection and/or diagnosis of SARS-CoV-2 by FDA under an Emergency Use Authorization (EUA). This EUA will remain in effect (meaning this test can be used) for the duration of the COVID-19 declaration under Section 564(b)(1) of the Act, 21 U.S.C. section 360bbb-3(b)(1), unless the authorization is terminated or revoked.  Performed at [facility], [place] [address] [place], [place] [address]          Radiology Studies: No results found.      Scheduled Meds: . amLODipine  10 mg Oral Daily  . Chlorhexidine Gluconate Cloth  6 each Topical Daily  . dexamethasone  4 mg Oral Q8H   Followed by  . [START ON 10/15/2020] dexamethasone  2 mg Oral Q8H   Followed by  . [START ON 10/17/2020] dexamethasone  2 mg Oral Q12H   Followed by  . [START ON 10/19/2020] dexamethasone  1 mg Oral Q12H   Followed by  . [START ON 10/22/2020] dexamethasone  1 mg Oral Daily  . docusate sodium  100 mg Oral BID  . heparin injection (subcutaneous)  5,000 Units Subcutaneous Q8H  . insulin aspart  0-20 Units Subcutaneous TID WC  . insulin aspart  0-5 Units Subcutaneous QHS  . insulin aspart  10 Units Subcutaneous TID WC  . [START ON 10/15/2020] insulin glargine  60 Units Subcutaneous Daily  . levETIRAcetam  500 mg Oral BID  . lisinopril  20 mg Oral Daily  .  pantoprazole  40 mg Oral Daily  . pravastatin  40 mg Oral q1800   Continuous Infusions: . lactated ringers 75 mL/hr at 10/13/20 2052  . niCARDipine Stopped (10/12/20 1539)     LOS: 4 days    Time spent: over 68 min    Fayrene Helper, MD [facility]   To contact the attending provider between 7A-7P or the covering provider during after hours 7P-7A, please log into the web site [email or web address] and access using universal [place] password for that web site. If you do not have the password, please call the hospital operator.  10/14/2020, 5:14 PM

## 2020-10-14 NOTE — Progress Notes (Signed)
Subjective: Patient reports doing well, has some pain over incision site. Didn't sleep well last night. Walked well around the unit yesterday.   Objective: Vital signs in last 24 hours: Temp:  [98.1 F (36.7 C)-98.8 F (37.1 C)] 98.4 F (36.9 C) (03/13 0720) Pulse Rate:  [77-105] 88 (03/13 0720) Resp:  [15-20] 18 (03/13 0720) BP: (125-165)/(57-72) 149/67 (03/13 0720) SpO2:  [9 %-98 %] 97 % (03/13 0720)  Intake/Output from previous day: 03/12 0701 - 03/13 0700 In: 1245 [P.O.:720; I.V.:525] Out: -  Intake/Output this shift: No intake/output data recorded.  Neurologic: Grossly normal  Lab Results: Lab Results  Component Value Date   WBC 13.2 (H) 10/14/2020   HGB 13.6 10/14/2020   HCT 40.2 10/14/2020   MCV 86.5 10/14/2020   PLT 213 10/14/2020   Lab Results  Component Value Date   INR 1.1 10/09/2020   BMET Lab Results  Component Value Date   NA 136 10/14/2020   K 4.2 10/14/2020   CL 103 10/14/2020   CO2 25 10/14/2020   GLUCOSE 292 (H) 10/14/2020   BUN 29 (H) 10/14/2020   CREATININE 0.81 10/14/2020   CALCIUM 8.4 (L) 10/14/2020    Studies/Results: MR BRAIN W WO CONTRAST  Result Date: 10/12/2020 CLINICAL DATA:  Brain mass or lesion. EXAM: MRI HEAD WITHOUT AND WITH CONTRAST TECHNIQUE: Multiplanar, multiecho pulse sequences of the brain and surrounding structures were obtained without and with intravenous contrast. CONTRAST:  46mL GADAVIST GADOBUTROL 1 MMOL/ML IV SOLN COMPARISON:  MRI of the brain October 10, 2020 FINDINGS: The study is degraded by motion. Brain: Subjacent to the area of recent craniotomy, there is increased dural thickness and contrast enhancement in the anterior left middle cranial fossa extending along the sphenoid wing and tentorial incisura. Findings reflect postsurgical changes although residual tumor cannot be entirely excluded. Continued follow-up suggested. The left temporal lobe appear preserved. No acute infarction, hemorrhage or hydrocephalus.  Vascular: Normal flow voids. Skull and upper cervical spine: Postsurgical changes from left frontotemporal craniotomy. Marrow signal characteristics are otherwise maintained. Sinuses/Orbits: Bilateral lens surgery. Paranasal sinuses are clear. Other: Left mastoid effusion. IMPRESSION: 1. Increased dural thickness and contrast enhancement in the anterior left middle cranial fossa extending along the sphenoid wing and tentorial incisura. Findings reflect postsurgical changes although residual tumor cannot be entirely excluded. Continued follow-up suggested. 2. Left mastoid effusion. Electronically Signed   By: Pedro Earls M.D.   On: 10/12/2020 11:36    Assessment/Plan: S/p crani for resection of meningioma. Work with PT/OT today and pain control. Likely home tomorrow.   LOS: 4 days    Regina Mckay 10/14/2020, 8:20 AM

## 2020-10-15 ENCOUNTER — Inpatient Hospital Stay (HOSPITAL_COMMUNITY): Payer: Medicare Other

## 2020-10-15 DIAGNOSIS — I160 Hypertensive urgency: Secondary | ICD-10-CM | POA: Diagnosis not present

## 2020-10-15 LAB — CBC WITH DIFFERENTIAL/PLATELET
Abs Immature Granulocytes: 0.05 10*3/uL (ref 0.00–0.07)
Basophils Absolute: 0 10*3/uL (ref 0.0–0.1)
Basophils Relative: 0 %
Eosinophils Absolute: 0 10*3/uL (ref 0.0–0.5)
Eosinophils Relative: 0 %
HCT: 41.9 % (ref 36.0–46.0)
Hemoglobin: 13.8 g/dL (ref 12.0–15.0)
Immature Granulocytes: 1 %
Lymphocytes Relative: 5 %
Lymphs Abs: 0.6 10*3/uL — ABNORMAL LOW (ref 0.7–4.0)
MCH: 28.5 pg (ref 26.0–34.0)
MCHC: 32.9 g/dL (ref 30.0–36.0)
MCV: 86.6 fL (ref 80.0–100.0)
Monocytes Absolute: 0.9 10*3/uL (ref 0.1–1.0)
Monocytes Relative: 8 %
Neutro Abs: 9.1 10*3/uL — ABNORMAL HIGH (ref 1.7–7.7)
Neutrophils Relative %: 86 %
Platelets: 206 10*3/uL (ref 150–400)
RBC: 4.84 MIL/uL (ref 3.87–5.11)
RDW: 11.8 % (ref 11.5–15.5)
WBC: 10.6 10*3/uL — ABNORMAL HIGH (ref 4.0–10.5)
nRBC: 0 % (ref 0.0–0.2)

## 2020-10-15 LAB — COMPREHENSIVE METABOLIC PANEL
ALT: 12 U/L (ref 0–44)
AST: 15 U/L (ref 15–41)
Albumin: 2.9 g/dL — ABNORMAL LOW (ref 3.5–5.0)
Alkaline Phosphatase: 47 U/L (ref 38–126)
Anion gap: 7 (ref 5–15)
BUN: 21 mg/dL (ref 8–23)
CO2: 28 mmol/L (ref 22–32)
Calcium: 8.4 mg/dL — ABNORMAL LOW (ref 8.9–10.3)
Chloride: 101 mmol/L (ref 98–111)
Creatinine, Ser: 0.82 mg/dL (ref 0.44–1.00)
GFR, Estimated: >60 mL/min (ref >60)
Glucose, Bld: 224 mg/dL — ABNORMAL HIGH (ref 70–99)
Potassium: 3.9 mmol/L (ref 3.5–5.1)
Sodium: 136 mmol/L (ref 135–145)
Total Bilirubin: 1.8 mg/dL — ABNORMAL HIGH (ref 0.3–1.2)
Total Protein: 5.8 g/dL — ABNORMAL LOW (ref 6.5–8.1)

## 2020-10-15 LAB — GLUCOSE, CAPILLARY
Glucose-Capillary: 211 mg/dL — ABNORMAL HIGH (ref 70–99)
Glucose-Capillary: 173 mg/dL — ABNORMAL HIGH (ref 70–99)
Glucose-Capillary: 206 mg/dL — ABNORMAL HIGH (ref 70–99)
Glucose-Capillary: 141 mg/dL — ABNORMAL HIGH (ref 70–99)
Glucose-Capillary: 147 mg/dL — ABNORMAL HIGH (ref 70–99)
Glucose-Capillary: 212 mg/dL — ABNORMAL HIGH (ref 70–99)
Glucose-Capillary: 166 mg/dL — ABNORMAL HIGH (ref 70–99)
Glucose-Capillary: 189 mg/dL — ABNORMAL HIGH (ref 70–99)

## 2020-10-15 LAB — PHOSPHORUS: Phosphorus: 2.5 mg/dL (ref 2.5–4.6)

## 2020-10-15 LAB — MAGNESIUM: Magnesium: 2 mg/dL (ref 1.7–2.4)

## 2020-10-15 LAB — SURGICAL PATHOLOGY

## 2020-10-15 MED ORDER — DEXAMETHASONE SODIUM PHOSPHATE 10 MG/ML IJ SOLN
6.0000 mg | Freq: Four times a day (QID) | INTRAMUSCULAR | Status: DC
  Administered 2020-10-15 – 2020-10-16 (×3): 6 mg via INTRAVENOUS
  Filled 2020-10-15 (×4): qty 0.6

## 2020-10-15 NOTE — Progress Notes (Signed)
Providing Compassionate, Quality Care - Together   Subjective: Nurse and occupational therapist report an increase in expressive aphasia since yesterday. The patient is frustrated because she recognizes she's not at her baseline.  Objective: Vital signs in last 24 hours: Temp:  [97.7 F (36.5 C)-99.3 F (37.4 C)] 98.3 F (36.8 C) (03/14 1541) Pulse Rate:  [79-87] 79 (03/14 1541) Resp:  [14-20] 19 (03/14 1541) BP: (141-157)/(59-73) 157/68 (03/14 1541) SpO2:  [96 %-100 %] 96 % (03/14 1541)  Intake/Output from previous day: 03/13 0701 - 03/14 0700 In: 364.5 [I.V.:364.5] Out: -  Intake/Output this shift: Total I/O In: 200 [P.O.:200] Out: -   Alert and oriented x 4 Speech clear, significant word finding difficulty PERRLA Mild edema at the left eye MAE, Strength and sensation intact Incision covered with dressing; small amount of dried sanguinous drainage present   Lab Results: Recent Labs    10/14/20 0410 10/15/20 0152  WBC 13.2* 10.6*  HGB 13.6 13.8  HCT 40.2 41.9  PLT 213 206   BMET Recent Labs    10/14/20 0410 10/15/20 0152  NA 136 136  K 4.2 3.9  CL 103 101  CO2 25 28  GLUCOSE 292* 224*  BUN 29* 21  CREATININE 0.81 0.82  CALCIUM 8.4* 8.4*    Studies/Results: CT HEAD WO CONTRAST  Addendum Date: 10/15/2020   ADDENDUM REPORT: 10/15/2020 15:28 ADDENDUM: These results were called by telephone at the time of interpretation on 10/15/2020 at 3:27 pm to provider Trenton Gammon , who verbally acknowledged these results. Electronically Signed   By: Franchot Gallo M.D.   On: 10/15/2020 15:28   Result Date: 10/15/2020 CLINICAL DATA:  Postop tumor resection EXAM: CT HEAD WITHOUT CONTRAST TECHNIQUE: Contiguous axial images were obtained from the base of the skull through the vertex without intravenous contrast. COMPARISON:  MRI head 10/12/2020.  CT head 10/09/2020 FINDINGS: Brain: Postop left temporal craniotomy for tumor resection. There is high-density hemorrhage in the  tumor bed. Hematoma measures approximately 3.5 x 1.9 x 3.0 cm is located under the left temporal lobe in the tumor bed. There is edema in the adjacent left temporal lobe which could be infarction. Small amount of subdural blood and gas is present on the left underlying the craniotomy flap. There is a small amount of subdural hemorrhage along the left tentorium. Mild mass-effect on the left lateral ventricle has developed since prior studies. No hydrocephalus or midline shift. Vascular: Negative for hyperdense vessel Skull: Left craniotomy with replacement of the craniotomy flap in good position. Sinuses/Orbits: Paranasal sinuses clear. Left mastoid effusion is new since the prior CT. Other: None IMPRESSION: Postop left temporal craniotomy for tumor resection. There is a hematoma in the tumor bed inferior to the left temporal lobe. There is low-density in the left temporal bone which could represent edema or infarction. Small subdural hematoma along the left tentorium. Left mastoid effusion, new since the recent study. Craniotomy appears to extend through the anterior portion of the mastoid sinus on the left. Electronically Signed: By: Franchot Gallo M.D. On: 10/15/2020 15:14    Assessment/Plan: Patient is four days status post left temporal craniotomy for resection of middle fossa meningioma. She had been recovering well, but developed changes in speech on 10/14/2020. Follow up imaging on 10/15/2020 revealed an increase in left temporal bleeding.   LOS: 5 days    -Discontinued SQ heparin -Increased decadron -Follow up scan ordered for tomorrow morning   Viona Gilmore, DNP, AGNP-C Nurse Practitioner  [facility] [place] [address], [address], [place], [place] [address] P: [phone]    F: [phone]  10/15/2020, 3:40 PM

## 2020-10-15 NOTE — Care Management Important Message (Signed)
Important Message  Patient Details  Name: Regina Mckay MRN: [id number] Date of Birth: [birth year]   Medicare Important Message Given:  Yes     Orbie Pyo 10/15/2020, 4:24 PM

## 2020-10-15 NOTE — Progress Notes (Signed)
Occupational Therapy Treatment Patient Details Name: Regina Mckay MRN: [id number] DOB: 1950-10-18 Today's Date: 10/15/2020    History of present illness Pt is a 70 y.o. female who presented 10/09/20 with R hand weakness and speech disturbance. Per chart, weakness in hand began 3 days PTA, waxing and waning. MRI revealed dural based lesion in the L middle cranial fossa most consistent with meningioma and causing mild displacement of adjacent temporal lobe. EEG suggestive of nonspecific cortical dysfunction in L temporal region. 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 PMH: DM2, HTN, BMI 41.   OT comments  NP Megan arriving at the end of session to notify patient in a change to her CT scan with some bleeding present. Pt tearful and attempting to ask questions with expressive deficits. OT helping reinforce patients concerns and pt nodding and thanking OT. Pt tearful and OT communicating Pt wish to have MD call daughter with update. Executive functional testing being held at this time due to increased expressive deficits. Pt with STM deficits that were not present on Saturday. Recommendation updated to outpatient at this time.    Follow Up Recommendations  Outpatient OT    Equipment Recommendations  None recommended by OT    Recommendations for Other Services      Precautions / Restrictions Precautions Precautions: Fall       Mobility Bed Mobility Overal bed mobility: Modified Independent                  Transfers Overall transfer level: Needs assistance   Transfers: Sit to/from Stand Sit to Stand: Min assist         General transfer comment: pt with slight unsteadiness declines RW and reports dizziness    Balance                                           ADL either performed or assessed with clinical judgement   ADL Overall ADL's : Needs assistance/impaired                         Toilet Transfer:  Minimal assistance;Grab bars;Regular Toilet   Toileting- Clothing Manipulation and Hygiene: Minimal assistance       Functional mobility during ADLs: Minimal assistance General ADL Comments: pt presented with pill box test. pt was unable to clearly read the labels. pt states incorrect word and does not notice. Pt tearful and expressing "they said it would get better" pt states "my sister was here but scan" Ot knows from saturday that sister is home with shoulder surgery in recovery so that it could not have been her sister but had to be daughter or granddaughter     Vision       Perception     Praxis      Cognition Arousal/Alertness: Awake/alert Behavior During Therapy: Flat affect Overall Cognitive Status: Impaired/Different from baseline Area of Impairment: Memory;Following commands;Safety/judgement;Awareness;Problem solving                     Memory: Decreased recall of precautions;Decreased short-term memory Following Commands: Follows one step commands with increased time Safety/Judgement: Decreased awareness of safety;Decreased awareness of deficits Awareness: Emergent Problem Solving: Slow processing General Comments: pt with no recall of therapist this session and states "no we didnt do that" and when Ot was able to give family  name, details of fathers dog and former cat she owned pt looks puzzled and states "we did?" Pt expressing concern fear and frustration over new word finding deficits. pt asking if "cut?" "die?" "be okay"        Exercises     Shoulder Instructions       General Comments VSS    Pertinent Vitals/ Pain       Pain Assessment: Faces Faces Pain Scale: Hurts little more Pain Location: head Pain Descriptors / Indicators: Headache Pain Intervention(s): Monitored during session;Repositioned  Home Living                                          Prior Functioning/Environment              Frequency  Other  (comment)        Progress Toward Goals  OT Goals(current goals can now be found in the care plan section)  Progress towards OT goals: Not progressing toward goals - comment  Acute Rehab OT Goals Patient Stated Goal: to go home OT Goal Formulation: With patient Time For Goal Achievement: 10/27/20 Potential to Achieve Goals: Good  Plan Discharge plan remains appropriate    Co-evaluation                 AM-PAC OT "6 Clicks" Daily Activity     Outcome Measure   Help from another person eating meals?: A Little Help from another person taking care of personal grooming?: A Little Help from another person toileting, which includes using toliet, bedpan, or urinal?: A Little Help from another person bathing (including washing, rinsing, drying)?: A Little Help from another person to put on and taking off regular upper body clothing?: A Little Help from another person to put on and taking off regular lower body clothing?: A Little 6 Click Score: 18    End of Session    OT Visit Diagnosis: Unsteadiness on feet (R26.81)   Activity Tolerance Patient tolerated treatment well   Patient Left in bed;with call bell/phone within reach;with family/visitor present   Nurse Communication Mobility status;Precautions        Time: 1510-1533 OT Time Calculation (min): 23 min  Charges: OT General Charges $OT Visit: 1 Visit OT Treatments $Self Care/Home Management : 8-22 mins   Brynn, OTR/L  Acute Rehabilitation Services Pager: [phone] Office: [phone] .    Jeri Modena 10/15/2020, 4:20 PM

## 2020-10-15 NOTE — Progress Notes (Signed)
PROGRESS NOTE    Regina Mckay  NWG:[id number] DOB: October 21, 1950 DOA: 10/09/2020 PCP: Jani Gravel, MD   Chief Complaint  Patient presents with  . stroke like s/s    Brief Narrative:   Regina Mckay is a very pleasant right-handed 71 y.o. female with medical history significant for type 2 diabetes mellitus, hypertension, and BMI 56, now presenting to the emergency department for evaluation of right hand weakness and speech disturbance.   Imaging notable for dural based lesion of the left middle cranial fossal most c/w meningioma.  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  Neurosurgery c/s pending.    Assessment & Plan:   Principal Problem:   Episode of transient neurologic symptoms Active Problems:   Uncontrolled diabetes mellitus with hyperglycemia (HCC)   Polycythemia   Hypertensive urgency   Speech abnormality  1. 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  - continue keppra 500 mg BID  -  S/p craniotomy for resection of meningioma, application of cranial navigation 3/11 - MRI 3/11 follow up with increased dural thickness and contrast enhancement in the anterior left middle cranial fossa extending along the sphenoid wing and tentorial incisura - postsurgical changes - neurosurgery c/s, appreciate recs - steroid taper - word finding difficulties today -> will discuss with neurosurgery - EEG without seizure or epileptiform discharges  # Delirium:  Today seems a little worse with word finding issues as well.  Couldn't remember Dr. Manon Hilding name.  A&Ox3.  As above, discussing with neurosurgery.  Delirium precautions, w/u further as indicated  2. Hypertensive urgency  - BP as high as 250/110 in ED in setting of anxiety and  running out of her antihypertensives  - BP goal < 578 systolic - restart lisinopril, continue amlodipine - BP still on high side, add HCTZ 12.5 mg (adjust prn) - prn enalaprilat and labetalol   3. Uncontrolled type II DM  - A1c 13.6 - lantus, basal, bolus, SSI - adjust prn (worsened hyperglycemia with steroids, follow with taper).  Improved this AM, continue to monitor.  # Elevated Bilirubin Improving, continue to monitor - notably elevated in 2020 as well Follow outpatient, w/u further as indicated here  4. Polycythemia  - Hgb 17.9 on admission - now improved post op - Patient not hypovolemic, denies smoking or lung disease, denies chest pain, and no CVA on MRI  - Check serum epo -- elevated - recommending heme follow up outpatient - workup further outpatient  Hasn't seen her PCP for several years, will probably need refills of most meds at discharged.  She's got appt with new doc in practice.   DVT prophylaxis: SCD Code Status: full  Family Communication: none at bedside - daughter at bedside Disposition:   Status is: Observation  The patient will require care spanning > 2 midnights and should be moved to inpatient because: Inpatient level of care appropriate due to severity of illness  Dispo: The patient is from: Home              Anticipated d/c is to: Home              Patient currently is not medically stable to d/c.   Difficult to place patient No       Consultants:   Neurology  neurosurgery  Procedures:  EEG pending  Antimicrobials: Anti-infectives (From admission, onward)   Start     Dose/Rate Route Frequency Ordered Stop   10/12/20 0300  vancomycin (VANCOREADY) IVPB 750 mg/150 mL        750 mg 150 mL/hr over 60 Minutes Intravenous Every 12 hours 10/11/20 2057 10/13/20 0422   10/11/20 0600  vancomycin (VANCOREADY) IVPB 1500 mg/300 mL  Status:  Discontinued        1,500 mg 150 mL/hr over 120 Minutes Intravenous On call to O.R. 10/10/20 1319 10/11/20  2017         Subjective: Pain improved Word finding difficulties - issues with telling me names of medicines Didn't remember her neurosurgeon's name  Objective: Vitals:   10/14/20 1939 10/14/20 2355 10/15/20 0415 10/15/20 0830  BP: (!) 141/62   (!) 152/73  Pulse: 84   81  Resp: 18  20 18   Temp: 98.3 F (36.8 C) 98.4 F (36.9 C) 98.4 F (36.9 C) 99 F (37.2 C)  TempSrc: Oral Oral Oral Oral  SpO2: 100%   96%  Weight:      Height:        Intake/Output Summary (Last 24 hours) at 10/15/2020 0944 Last data filed at 10/14/2020 1500 Gross per 24 hour  Intake 364.52 ml  Output -  Net 364.52 ml   Filed Weights   10/09/20 1401  Weight: 108 kg    Examination:  General: No acute distress. Cardiovascular: Heart sounds show a regular rate, and rhythm Lungs: Clear to auscultation bilaterally Abdomen: Soft, nontender, nondistended Neurological: Alert and oriented 3, word finding issues. Moves all extremities 4. Cranial nerves II through XII grossly intact. Skin: Warm and dry. No rashes or lesions. Extremities: No clubbing or cyanosis. No edema.    Data Reviewed: I have personally reviewed following labs and imaging studies  CBC: Recent Labs  Lab 10/09/20 1337 10/10/20 0443 10/11/20 0603 10/11/20 1827 10/11/20 1833 10/13/20 0921 10/14/20 0410 10/15/20 0152  WBC 7.8 6.1 6.0  --   --  18.8* 13.2* 10.6*  NEUTROABS 5.5  --  4.0  --   --  17.9* 12.4* 9.1*  HGB 17.9* 17.2* 17.3* 15.3* 15.3* 15.2* 13.6 13.8  HCT 53.9* 51.2* 52.6* 45.0 45.0 45.7 40.2 41.9  MCV 86.4 86.2 87.8  --   --  88.1 86.5 86.6  PLT 218 203 224  --   --  226 213 597    Basic Metabolic Panel: Recent Labs  Lab 10/10/20 0443 10/11/20 0603 10/11/20 1827 10/11/20 1833 10/13/20 0921 10/14/20 0410 10/15/20 0152  NA 133* 135 136 137 136 136 136  K 3.8 4.3 4.3 4.3 4.8 4.2 3.9  CL 96* 99  --  101 104 103 101  CO2 25 27  --   --  18* 25 28  GLUCOSE 325* 385*  --  267* 325* 292* 224*  BUN 10 16   --  17 21 29* 21  CREATININE 0.80 0.88  --  0.60 0.89 0.81 0.82  CALCIUM 9.5 9.1  --   --  8.7* 8.4* 8.4*  MG  --  1.8  --   --   --  2.0 2.0  PHOS  --  4.6  --   --   --  2.6 2.5    GFR: Estimated Creatinine Clearance: 75.5 mL/min (by C-G formula based on SCr of 0.82 mg/dL).  Liver Function Tests: Recent Labs  Lab 10/09/20 1337 10/11/20 0603 10/13/20 4163 10/14/20 0410 10/15/20 8453  AST 17 13* 17 15 15   ALT 18 16 14 14 12   ALKPHOS 67 52 62 51 47  BILITOT 1.4* 1.4* 2.2* 2.5* 1.8*  PROT 7.7 6.5 6.8 6.2* 5.8*  ALBUMIN 4.1 3.3* 3.5 3.1* 2.9*    CBG: Recent Labs  Lab 10/14/20 2035 10/14/20 2114 10/14/20 2357 10/15/20 0415 10/15/20 0832  GLUCAP 302* 308* 233* 211* 173*     Recent Results (from the past 240 hour(s))  Resp Panel by RT-PCR (Flu A&B, Covid) Nasopharyngeal Swab     Status: None   Collection Time: 10/09/20  9:09 PM   Specimen: Nasopharyngeal Swab; Nasopharyngeal(NP) swabs in vial transport medium  Result Value Ref Range Status   SARS Coronavirus 2 by RT PCR NEGATIVE NEGATIVE Final    Comment: (NOTE) SARS-CoV-2 target nucleic acids are NOT DETECTED.  The SARS-CoV-2 RNA is generally detectable in upper respiratory specimens during the acute phase of infection. The lowest concentration of SARS-CoV-2 viral copies this assay can detect is 138 copies/mL. A negative result does not preclude SARS-Cov-2 infection and should not be used as the sole basis for treatment or other patient management decisions. A negative result may occur with  improper specimen collection/handling, submission of specimen other than nasopharyngeal swab, presence of viral mutation(s) within the areas targeted by this assay, and inadequate number of viral copies(<138 copies/mL). A negative result must be combined with clinical observations, patient history, and epidemiological information. The expected result is Negative.  Fact Sheet for Patients:   EntrepreneurPulse.com.au  Fact Sheet for Healthcare Providers:  IncredibleEmployment.be  This test is no t yet approved or cleared by the [place] FDA and  has been authorized for detection and/or diagnosis of SARS-CoV-2 by FDA under an Emergency Use Authorization (EUA). This EUA will remain  in effect (meaning this test can be used) for the duration of the COVID-19 declaration under Section 564(b)(1) of the Act, 21 U.S.C.section 360bbb-3(b)(1), unless the authorization is terminated  or revoked sooner.       Influenza A by PCR NEGATIVE NEGATIVE Final   Influenza B by PCR NEGATIVE NEGATIVE Final    Comment: (NOTE) The Xpert Xpress SARS-CoV-2/FLU/RSV plus assay is intended as an aid in the diagnosis of influenza from Nasopharyngeal swab specimens and should not be used as a sole basis for treatment. Nasal washings and aspirates are unacceptable for Xpert Xpress SARS-CoV-2/FLU/RSV testing.  Fact Sheet for Patients: EntrepreneurPulse.com.au  Fact Sheet for Healthcare Providers: IncredibleEmployment.be  This test is not yet approved or cleared by the [place] FDA and has been authorized for detection and/or diagnosis of SARS-CoV-2 by FDA under an Emergency Use Authorization (EUA). This EUA will remain in effect (meaning this test can be used) for the duration of the COVID-19 declaration under Section 564(b)(1) of the Act, 21 U.S.C. section 360bbb-3(b)(1), unless the authorization is terminated or revoked.  Performed at [facility], [place] [address], [place], [place] [address]          Radiology Studies: No results found.      Scheduled Meds: . amLODipine  10 mg Oral Daily  . Chlorhexidine Gluconate Cloth  6 each Topical Daily  . dexamethasone  4 mg Oral Q8H   Followed by  . dexamethasone  2 mg Oral Q8H   Followed by  . [START ON 10/17/2020] dexamethasone  2 mg Oral Q12H    Followed by  . [START ON 10/19/2020] dexamethasone  1 mg Oral Q12H   Followed by  . [START ON 10/22/2020]  dexamethasone  1 mg Oral Daily  . docusate sodium  100 mg Oral BID  . heparin injection (subcutaneous)  5,000 Units Subcutaneous Q8H  . hydrochlorothiazide  12.5 mg Oral Daily  . insulin aspart  0-20 Units Subcutaneous TID WC  . insulin aspart  0-5 Units Subcutaneous QHS  . insulin aspart  10 Units Subcutaneous TID WC  . insulin glargine  60 Units Subcutaneous Daily  . levETIRAcetam  500 mg Oral BID  . lisinopril  20 mg Oral Daily  . pantoprazole  40 mg Oral Daily  . pravastatin  40 mg Oral q1800   Continuous Infusions:    LOS: 5 days    Time spent: over 30 min    Fayrene Helper, MD [facility]   To contact the attending provider between 7A-7P or the covering provider during after hours 7P-7A, please log into the web site [email or web address] and access using universal [place] password for that web site. If you do not have the password, please call the hospital operator.  10/15/2020, 9:44 AM

## 2020-10-16 ENCOUNTER — Inpatient Hospital Stay (HOSPITAL_COMMUNITY): Payer: Medicare Other

## 2020-10-16 DIAGNOSIS — I160 Hypertensive urgency: Secondary | ICD-10-CM | POA: Diagnosis not present

## 2020-10-16 LAB — CBC WITH DIFFERENTIAL/PLATELET
Abs Immature Granulocytes: 0.04 10*3/uL (ref 0.00–0.07)
Basophils Absolute: 0 10*3/uL (ref 0.0–0.1)
Basophils Relative: 0 %
Eosinophils Absolute: 0 10*3/uL (ref 0.0–0.5)
Eosinophils Relative: 0 %
HCT: 42.8 % (ref 36.0–46.0)
Hemoglobin: 14.3 g/dL (ref 12.0–15.0)
Immature Granulocytes: 1 %
Lymphocytes Relative: 8 %
Lymphs Abs: 0.5 10*3/uL — ABNORMAL LOW (ref 0.7–4.0)
MCH: 28.8 pg (ref 26.0–34.0)
MCHC: 33.4 g/dL (ref 30.0–36.0)
MCV: 86.1 fL (ref 80.0–100.0)
Monocytes Absolute: 0.5 10*3/uL (ref 0.1–1.0)
Monocytes Relative: 7 %
Neutro Abs: 5.8 10*3/uL (ref 1.7–7.7)
Neutrophils Relative %: 84 %
Platelets: 204 10*3/uL (ref 150–400)
RBC: 4.97 MIL/uL (ref 3.87–5.11)
RDW: 11.6 % (ref 11.5–15.5)
WBC: 6.8 10*3/uL (ref 4.0–10.5)
nRBC: 0 % (ref 0.0–0.2)

## 2020-10-16 LAB — COMPREHENSIVE METABOLIC PANEL
ALT: 13 U/L (ref 0–44)
AST: 22 U/L (ref 15–41)
Albumin: 2.8 g/dL — ABNORMAL LOW (ref 3.5–5.0)
Alkaline Phosphatase: 47 U/L (ref 38–126)
Anion gap: 8 (ref 5–15)
BUN: 22 mg/dL (ref 8–23)
CO2: 27 mmol/L (ref 22–32)
Calcium: 8.3 mg/dL — ABNORMAL LOW (ref 8.9–10.3)
Chloride: 98 mmol/L (ref 98–111)
Creatinine, Ser: 0.62 mg/dL (ref 0.44–1.00)
GFR, Estimated: >60 mL/min (ref >60)
Glucose, Bld: 218 mg/dL — ABNORMAL HIGH (ref 70–99)
Potassium: 4.4 mmol/L (ref 3.5–5.1)
Sodium: 133 mmol/L — ABNORMAL LOW (ref 135–145)
Total Bilirubin: 1.8 mg/dL — ABNORMAL HIGH (ref 0.3–1.2)
Total Protein: 5.8 g/dL — ABNORMAL LOW (ref 6.5–8.1)

## 2020-10-16 LAB — GLUCOSE, CAPILLARY
Glucose-Capillary: 266 mg/dL — ABNORMAL HIGH (ref 70–99)
Glucose-Capillary: 286 mg/dL — ABNORMAL HIGH (ref 70–99)
Glucose-Capillary: 291 mg/dL — ABNORMAL HIGH (ref 70–99)
Glucose-Capillary: 225 mg/dL — ABNORMAL HIGH (ref 70–99)
Glucose-Capillary: 250 mg/dL — ABNORMAL HIGH (ref 70–99)
Glucose-Capillary: 241 mg/dL — ABNORMAL HIGH (ref 70–99)
Glucose-Capillary: 206 mg/dL — ABNORMAL HIGH (ref 70–99)

## 2020-10-16 LAB — MAGNESIUM: Magnesium: 2 mg/dL (ref 1.7–2.4)

## 2020-10-16 LAB — PHOSPHORUS: Phosphorus: 2.8 mg/dL (ref 2.5–4.6)

## 2020-10-16 MED ORDER — INSULIN ASPART 100 UNIT/ML ~~LOC~~ SOLN
13.0000 [IU] | Freq: Three times a day (TID) | SUBCUTANEOUS | Status: DC
  Administered 2020-10-16 – 2020-10-20 (×10): 13 [IU] via SUBCUTANEOUS

## 2020-10-16 MED ORDER — INSULIN GLARGINE 100 UNIT/ML ~~LOC~~ SOLN
70.0000 [IU] | Freq: Every day | SUBCUTANEOUS | Status: DC
  Administered 2020-10-17 – 2020-10-20 (×4): 70 [IU] via SUBCUTANEOUS
  Filled 2020-10-16 (×5): qty 0.7

## 2020-10-16 MED ORDER — INSULIN GLARGINE 100 UNIT/ML ~~LOC~~ SOLN
5.0000 [IU] | Freq: Once | SUBCUTANEOUS | Status: AC
  Administered 2020-10-16: 5 [IU] via SUBCUTANEOUS
  Filled 2020-10-16: qty 0.05

## 2020-10-16 MED ORDER — INSULIN GLARGINE 100 UNIT/ML ~~LOC~~ SOLN: 65.0000 [IU] | Freq: Every day | SUBCUTANEOUS | Status: DC

## 2020-10-16 MED ORDER — DEXAMETHASONE 4 MG PO TABS
4.0000 mg | ORAL_TABLET | Freq: Three times a day (TID) | ORAL | Status: DC
  Administered 2020-10-16 – 2020-10-19 (×9): 4 mg via ORAL
  Filled 2020-10-16 (×9): qty 1

## 2020-10-16 NOTE — Progress Notes (Signed)
PROGRESS NOTE    Regina Mckay  ZJQ:[id number] DOB: 13-Apr-1951 DOA: 10/09/2020 PCP: Jani Gravel, MD   Chief Complaint  Patient presents with  . stroke like s/s    Brief Narrative:   Regina Mckay is a very pleasant right-handed 70 y.o. female with medical history significant for type 2 diabetes mellitus, hypertension, and BMI 42, now presenting to the emergency department for evaluation of right hand weakness and speech disturbance.   Imaging notable for dural based lesion of the left middle cranial fossal most c/w meningioma.  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  She's now s/p craniotomy for resection of meningioma.  Hospitalization complicated by increased difficulties with speech with word finding issues and CT findings showing hematoma.    Assessment & Plan:   Principal Problem:   Episode of transient neurologic symptoms Active Problems:   Uncontrolled diabetes mellitus with hyperglycemia (HCC)   Polycythemia   Hypertensive urgency   Speech abnormality  1. 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  - continue keppra 500 mg BID  -  S/p craniotomy for resection of meningioma, application of cranial navigation 3/11 - MRI 3/11 follow up with increased dural thickness and contrast enhancement in the anterior left middle cranial fossa extending along the sphenoid wing and tentorial incisura - postsurgical changes - neurosurgery c/s, appreciate recs - steroid taper - word finding difficulties noted on 3/14 (per nsgy note nurse and OT noted 3/13) -> CT head 3/14 with hematoma in tumor bed inferior to he L temporal lobe, low density in the L temporal bone which could represent edema or infarction.  Small subdural hematoma along  the L tentorium.  (see report) - CT head 3/15 with stable post op blood products and edema - EEG without seizure or epileptiform discharges  # Delirium:  Today seems a little worse with word finding issues as well.  Couldn't remember Dr. Manon Hilding name.  A&Ox3.  As above, discussing with neurosurgery.  Delirium precautions, w/u further as indicated  2. Hypertensive urgency  - BP as high as 250/110 in ED in setting of anxiety and running out of her antihypertensives  - BP goal < 240 systolic - restart lisinopril, continue amlodipine - BP still on high side, add HCTZ 12.5 mg (adjust prn) - prn enalaprilat and labetalol   3. Uncontrolled type II DM  - A1c 13.6 - lantus, basal, bolus, SSI - adjust prn (worsened hyperglycemia with steroids, follow with taper).  Improved this AM, continue to monitor.  # Elevated Bilirubin Improving, continue to monitor - notably elevated in 2020 as well Follow outpatient, w/u further as indicated here  4. Polycythemia  - Hgb 17.9 on admission - now improved post op - Patient not hypovolemic, denies smoking or lung disease, denies chest pain, and no CVA on MRI  - Check serum epo -- elevated - recommending heme follow up outpatient - workup further outpatient  Hasn't seen her PCP for several years, will probably need refills of most meds at discharged.  She's got appt with new doc in practice.   DVT prophylaxis: SCD Code Status: full  Family Communication: sister over phone Disposition:   Status is: Observation  The patient will require care spanning > 2 midnights and should be moved to inpatient because:  Inpatient level of care appropriate due to severity of illness  Dispo: The patient is from: Home              Anticipated d/c is to: Home              Patient currently is not medically stable to d/c.   Difficult to place patient No       Consultants:   Neurology  neurosurgery  Procedures:  EEG  pending  Antimicrobials: Anti-infectives (From admission, onward)   Start     Dose/Rate Route Frequency Ordered Stop   10/12/20 0300  vancomycin (VANCOREADY) IVPB 750 mg/150 mL        750 mg 150 mL/hr over 60 Minutes Intravenous Every 12 hours 10/11/20 2057 10/13/20 0422   10/11/20 0600  vancomycin (VANCOREADY) IVPB 1500 mg/300 mL  Status:  Discontinued        1,500 mg 150 mL/hr over 120 Minutes Intravenous On call to O.R. 10/10/20 1319 10/11/20 2017         Subjective: Frustrated Continued speech issues with word finding  Objective: Vitals:   10/15/20 2309 10/16/20 0315 10/16/20 0747 10/16/20 1526  BP: (!) 125/59 (P) 130/71 137/72 139/69  Pulse: 71 (!) (P) 59 80 84  Resp: 20  16 20   Temp: (!) 97.1 F (36.2 C)  (!) 97.5 F (36.4 C) 98 F (36.7 C)  TempSrc: Axillary  Oral   SpO2: 91%  96% 99%  Weight:      Height:        Intake/Output Summary (Last 24 hours) at 10/16/2020 2018 Last data filed at 10/16/2020 1230 Gross per 24 hour  Intake 240 ml  Output -  Net 240 ml   Filed Weights   10/09/20 1401  Weight: 108 kg    Examination:  General: No acute distress. HEENT: post op dressing in place Cardiovascular: Heart sounds show a regular rate, and rhythm.  Lungs: Clear to auscultation bilaterally  Abdomen: Soft, nontender, nondistended Neurological: Alert, but word finding issues. Moves all extremities 4. Cranial nerves II through XII grossly intact. Skin: Warm and dry. No rashes or lesions. Extremities: No clubbing or cyanosis. No edema.    Data Reviewed: I have personally reviewed following labs and imaging studies  CBC: Recent Labs  Lab 10/11/20 0603 10/11/20 1827 10/11/20 1833 10/13/20 0921 10/14/20 0410 10/15/20 0152 10/16/20 0102  WBC 6.0  --   --  18.8* 13.2* 10.6* 6.8  NEUTROABS 4.0  --   --  17.9* 12.4* 9.1* 5.8  HGB 17.3*   < > 15.3* 15.2* 13.6 13.8 14.3  HCT 52.6*   < > 45.0 45.7 40.2 41.9 42.8  MCV 87.8  --   --  88.1 86.5 86.6 86.1   PLT 224  --   --  226 213 206 204   < > = values in this interval not displayed.    Basic Metabolic Panel: Recent Labs  Lab 10/11/20 0603 10/11/20 1827 10/11/20 1833 10/13/20 0921 10/14/20 0410 10/15/20 0152 10/16/20 0102  NA 135   < > 137 136 136 136 133*  K 4.3   < > 4.3 4.8 4.2 3.9 4.4  CL 99  --  101 104 103 101 98  CO2 27  --   --  18* 25 28 27   GLUCOSE 385*  --  267* 325* 292* 224* 218*  BUN 16  --  17 21 29* 21 22  CREATININE 0.88  --  0.60 0.89 0.81 0.82 0.62  CALCIUM 9.1  --   --  8.7* 8.4* 8.4* 8.3*  MG 1.8  --   --   --  2.0 2.0 2.0  PHOS 4.6  --   --   --  2.6 2.5 2.8   < > = values in this interval not displayed.    GFR: Estimated Creatinine Clearance: 77.4 mL/min (by C-G formula based on SCr of 0.62 mg/dL).  Liver Function Tests: Recent Labs  Lab 10/11/20 0603 10/13/20 0921 10/14/20 0410 10/15/20 0152 10/16/20 0102  AST 13* 17 15 15 22   ALT 16 14 14 12 13   ALKPHOS 52 62 51 47 47  BILITOT 1.4* 2.2* 2.5* 1.8* 1.8*  PROT 6.5 6.8 6.2* 5.8* 5.8*  ALBUMIN 3.3* 3.5 3.1* 2.9* 2.8*    CBG: Recent Labs  Lab 10/16/20 0321 10/16/20 0749 10/16/20 0822 10/16/20 1217 10/16/20 1459  GLUCAP 266* 286* 291* 225* 250*     Recent Results (from the past 240 hour(s))  Resp Panel by RT-PCR (Flu A&B, Covid) Nasopharyngeal Swab     Status: None   Collection Time: 10/09/20  9:09 PM   Specimen: Nasopharyngeal Swab; Nasopharyngeal(NP) swabs in vial transport medium  Result Value Ref Range Status   SARS Coronavirus 2 by RT PCR NEGATIVE NEGATIVE Final    Comment: (NOTE) SARS-CoV-2 target nucleic acids are NOT DETECTED.  The SARS-CoV-2 RNA is generally detectable in upper respiratory specimens during the acute phase of infection. The lowest concentration of SARS-CoV-2 viral copies this assay can detect is 138 copies/mL. A negative result does not preclude SARS-Cov-2 infection and should not be used as the sole basis for treatment or other patient management  decisions. A negative result may occur with  improper specimen collection/handling, submission of specimen other than nasopharyngeal swab, presence of viral mutation(s) within the areas targeted by this assay, and inadequate number of viral copies(<138 copies/mL). A negative result must be combined with clinical observations, patient history, and epidemiological information. The expected result is Negative.  Fact Sheet for Patients:  EntrepreneurPulse.com.au  Fact Sheet for Healthcare Providers:  IncredibleEmployment.be  This test is no t yet approved or cleared by the [place] FDA and  has been authorized for detection and/or diagnosis of SARS-CoV-2 by FDA under an Emergency Use Authorization (EUA). This EUA will remain  in effect (meaning this test can be used) for the duration of the COVID-19 declaration under Section 564(b)(1) of the Act, 21 U.S.C.section 360bbb-3(b)(1), unless the authorization is terminated  or revoked sooner.       Influenza A by PCR NEGATIVE NEGATIVE Final   Influenza B by PCR NEGATIVE NEGATIVE Final    Comment: (NOTE) The Xpert Xpress SARS-CoV-2/FLU/RSV plus assay is intended as an aid in the diagnosis of influenza from Nasopharyngeal swab specimens and should not be used as a sole basis for treatment. Nasal washings and aspirates are unacceptable for Xpert Xpress SARS-CoV-2/FLU/RSV testing.  Fact Sheet for Patients: EntrepreneurPulse.com.au  Fact Sheet for Healthcare Providers: IncredibleEmployment.be  This test is not yet approved or cleared by the [place] FDA and has been authorized for detection and/or diagnosis of SARS-CoV-2 by FDA under an Emergency Use Authorization (EUA). This EUA will remain in effect (meaning this test can be used) for the duration of the COVID-19 declaration under Section 564(b)(1) of the Act, 21 U.S.C. section 360bbb-3(b)(1), unless the  authorization is terminated or revoked.  Performed at [facility], [place] [address], [place], [place] [address]  Radiology Studies: CT HEAD WO CONTRAST  Result Date: 10/16/2020 CLINICAL DATA:  Postop tumor resection EXAM: CT HEAD WITHOUT CONTRAST TECHNIQUE: Contiguous axial images were obtained from the base of the skull through the vertex without intravenous contrast. COMPARISON:  Yesterday FINDINGS: Brain: Left pterional craniotomy with left temporal lobe hemorrhage and edema is non progressed. The parenchymal hemorrhagic component continues to measure up to 3 cm. Small volume subdural gas and hemorrhage along the left convexity is also non progressed. No complicating infarct, entrapment, or progressive mass effect. Vascular: Negative Skull: Unremarkable left craniotomy. Sinuses/Orbits: Bilateral cataract resection IMPRESSION: Stable postoperative blood products and edema. Electronically Signed   By: Monte Fantasia M.D.   On: 10/16/2020 06:34   CT HEAD WO CONTRAST  Addendum Date: 10/15/2020   ADDENDUM REPORT: 10/15/2020 15:28 ADDENDUM: These results were called by telephone at the time of interpretation on 10/15/2020 at 3:27 pm to provider Trenton Gammon , who verbally acknowledged these results. Electronically Signed   By: Franchot Gallo M.D.   On: 10/15/2020 15:28   Result Date: 10/15/2020 CLINICAL DATA:  Postop tumor resection EXAM: CT HEAD WITHOUT CONTRAST TECHNIQUE: Contiguous axial images were obtained from the base of the skull through the vertex without intravenous contrast. COMPARISON:  MRI head 10/12/2020.  CT head 10/09/2020 FINDINGS: Brain: Postop left temporal craniotomy for tumor resection. There is high-density hemorrhage in the tumor bed. Hematoma measures approximately 3.5 x 1.9 x 3.0 cm is located under the left temporal lobe in the tumor bed. There is edema in the adjacent left temporal lobe which could be infarction. Small amount of subdural blood and gas is present on  the left underlying the craniotomy flap. There is a small amount of subdural hemorrhage along the left tentorium. Mild mass-effect on the left lateral ventricle has developed since prior studies. No hydrocephalus or midline shift. Vascular: Negative for hyperdense vessel Skull: Left craniotomy with replacement of the craniotomy flap in good position. Sinuses/Orbits: Paranasal sinuses clear. Left mastoid effusion is new since the prior CT. Other: None IMPRESSION: Postop left temporal craniotomy for tumor resection. There is a hematoma in the tumor bed inferior to the left temporal lobe. There is low-density in the left temporal bone which could represent edema or infarction. Small subdural hematoma along the left tentorium. Left mastoid effusion, new since the recent study. Craniotomy appears to extend through the anterior portion of the mastoid sinus on the left. Electronically Signed: By: Franchot Gallo M.D. On: 10/15/2020 15:14        Scheduled Meds: . amLODipine  10 mg Oral Daily  . Chlorhexidine Gluconate Cloth  6 each Topical Daily  . dexamethasone  4 mg Oral Q8H  . docusate sodium  100 mg Oral BID  . hydrochlorothiazide  12.5 mg Oral Daily  . insulin aspart  0-20 Units Subcutaneous TID WC  . insulin aspart  0-5 Units Subcutaneous QHS  . insulin aspart  13 Units Subcutaneous TID WC  . [START ON 10/17/2020] insulin glargine  65 Units Subcutaneous Daily  . levETIRAcetam  500 mg Oral BID  . lisinopril  20 mg Oral Daily  . pantoprazole  40 mg Oral Daily  . pravastatin  40 mg Oral q1800   Continuous Infusions:    LOS: 6 days    Time spent: over 30 min    Fayrene Helper, MD [facility]   To contact the attending provider between 7A-7P or the covering provider during after hours 7P-7A, please log into the web site [email or web address] and access  using universal [place] password for that web site. If you do not have the password, please call the hospital operator.  10/16/2020,  8:18 PM

## 2020-10-16 NOTE — Evaluation (Signed)
Physical Therapy Evaluation Patient Details Name: Regina Mckay MRN: [id number] DOB: Jan 08, 1951 Today's Date: 10/16/2020   History of Present Illness  Pt is a 70 y.o. female who presented 10/09/20 with R hand weakness and speech disturbance. Per chart, weakness in hand began 3 days PTA, waxing and waning. MRI revealed dural based lesion in the L middle cranial fossa most consistent with meningioma and causing mild displacement of adjacent temporal lobe. EEG suggestive of nonspecific cortical dysfunction in L temporal region. 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 CT head 3/14 showing hematoma in the tumor bed inferior to the left temporal lobe.PMH: DM2, HTN, BMI 41.  Clinical Impression  Pt with new expressive communication deficits, however, mobility remains similar to prior therapy sessions. Pt ambulating x 500 feet with no assistive device at a supervision level. Able to correctly identify objects in environment, however, demonstrates other word finding difficulties in conversations, I.e. stating "salad," when asked what her favorite ice cream was and "none," when asked what her favorite color is. D/c plan remains appropriate.     Follow Up Recommendations Supervision for mobility/OOB;Outpatient PT    Equipment Recommendations  None recommended by PT    Recommendations for Other Services       Precautions / Restrictions Precautions Precautions: Fall Restrictions Weight Bearing Restrictions: No      Mobility  Bed Mobility Overal bed mobility: Independent                  Transfers Overall transfer level: Independent Equipment used: None                Ambulation/Gait Ambulation/Gait assistance: Supervision Gait Distance (Feet): 500 Feet Assistive device: None Gait Pattern/deviations: Step-through pattern;Decreased stride length;Wide base of support        Stairs            Wheelchair Mobility    Modified Rankin  (Stroke Patients Only) Modified Rankin (Stroke Patients Only) Pre-Morbid Rankin Score: No symptoms Modified Rankin: Moderate disability     Balance Overall balance assessment: Mild deficits observed, not formally tested                                           Pertinent Vitals/Pain Pain Assessment: Faces Faces Pain Scale: Hurts little more Pain Location: head Pain Descriptors / Indicators: Headache    Home Living                        Prior Function                 Hand Dominance        Extremity/Trunk Assessment                Communication      Cognition Arousal/Alertness: Awake/alert Behavior During Therapy: Flat affect Overall Cognitive Status: Impaired/Different from baseline Area of Impairment: Memory;Following commands;Safety/judgement;Awareness;Problem solving                     Memory: Decreased recall of precautions;Decreased short-term memory Following Commands: Follows one step commands with increased time Safety/Judgement: Decreased awareness of safety;Decreased awareness of deficits Awareness: Emergent Problem Solving: Slow processing General Comments: pt with expressive difficulties, when asked her favorite ice cream stating, "salad," perseverating on "tuesday," when being asked what month it was.      General  Comments      Exercises     Assessment/Plan    PT Assessment    PT Problem List         PT Treatment Interventions      PT Goals (Current goals can be found in the Care Plan section)  Acute Rehab PT Goals Patient Stated Goal: to go home Potential to Achieve Goals: Good    Frequency Min 3X/week   Barriers to discharge        Co-evaluation               AM-PAC PT "6 Clicks" Mobility  Outcome Measure Help needed turning from your back to your side while in a flat bed without using bedrails?: None Help needed moving from lying on your back to sitting on the side of a  flat bed without using bedrails?: None Help needed moving to and from a bed to a chair (including a wheelchair)?: None Help needed standing up from a chair using your arms (e.g., wheelchair or bedside chair)?: None Help needed to walk in hospital room?: None Help needed climbing 3-5 steps with a railing? : A Little 6 Click Score: 23    End of Session   Activity Tolerance: Patient tolerated treatment well Patient left: in bed;with call bell/phone within reach Nurse Communication: Mobility status PT Visit Diagnosis: Unsteadiness on feet (R26.81);Other abnormalities of gait and mobility (R26.89);Muscle weakness (generalized) (M62.81)    Time: 6384-5364 PT Time Calculation (min) (ACUTE ONLY): 21 min   Charges:     PT Treatments $Therapeutic Activity: 8-22 mins        Wyona Almas, PT, DPT Acute Rehabilitation Services Pager [phone] Office [phone]   Deno Etienne 10/16/2020, 5:08 PM

## 2020-10-16 NOTE — Progress Notes (Signed)
Inpatient Diabetes Program Recommendations  AACE/ADA: New Consensus Statement on Inpatient Glycemic Control (2015)  Target Ranges:  Prepandial:   less than 140 mg/dL      Peak postprandial:   less than 180 mg/dL (1-2 hours)      Critically ill patients:  140 - 180 mg/dL   Lab Results  Component Value Date   GLUCAP 291 (H) 10/16/2020   HGBA1C 13.6 (H) 10/10/2020    Review of Glycemic Control Results for Regina Mckay, Regina Mckay (MRN [id number]) as of 10/16/2020 11:33  Ref. Range 10/15/2020 08:32 10/15/2020 11:37 10/15/2020 16:00 10/15/2020 18:38 10/15/2020 19:38 10/15/2020 21:52 10/15/2020 23:12 10/16/2020 03:21 10/16/2020 07:49 10/16/2020 08:22  Glucose-Capillary Latest Ref Range: 70 - 99 mg/dL 173 (H) 206 (H) 141 (H) 147 (H) 212 (H) 166 (H) 189 (H) 266 (H) 286 (H) 291 (H)   Diabetes history: DM 2 (was seeing Dr. Maudie Mercury, will make new appt with PCP in that office) Outpatient Diabetes medications: Glucovance 5-500 mg tid, Basaglar 50 units qhs Current orders for Inpatient glycemic control:  Lantus 60 units Novolog 0-20 units tid + hs Novolog 10 units tid meal coverage  Inpatient Diabetes Program Recommendations:    Decadron increased from 4 mg to 6 mg Q 6 hours  -  Increase Lantus to 70 units  Spoke with pt in detail on 3/9. Pt was not on medication for awhile. She was not able to get refills due to the amount of time it had been since seeing a doctor.  D/c: Blood glucose meter kit order # [id number] Refill on Glucovance Refill on Amlodipine  Will follow pt. Educate again if needed closer to d/c.  Thanks,  Tama Headings RN, MSN, BC-ADM Inpatient Diabetes Coordinator Team Pager [phone] (8a-5p)

## 2020-10-17 DIAGNOSIS — I10 Essential (primary) hypertension: Secondary | ICD-10-CM | POA: Diagnosis not present

## 2020-10-17 DIAGNOSIS — E1165 Type 2 diabetes mellitus with hyperglycemia: Secondary | ICD-10-CM | POA: Diagnosis not present

## 2020-10-17 LAB — COMPREHENSIVE METABOLIC PANEL
ALT: 15 U/L (ref 0–44)
AST: 15 U/L (ref 15–41)
Albumin: 2.7 g/dL — ABNORMAL LOW (ref 3.5–5.0)
Alkaline Phosphatase: 45 U/L (ref 38–126)
Anion gap: 9 (ref 5–15)
BUN: 26 mg/dL — ABNORMAL HIGH (ref 8–23)
CO2: 27 mmol/L (ref 22–32)
Calcium: 8.1 mg/dL — ABNORMAL LOW (ref 8.9–10.3)
Chloride: 97 mmol/L — ABNORMAL LOW (ref 98–111)
Creatinine, Ser: 0.81 mg/dL (ref 0.44–1.00)
GFR, Estimated: >60 mL/min (ref >60)
Glucose, Bld: 292 mg/dL — ABNORMAL HIGH (ref 70–99)
Potassium: 3.8 mmol/L (ref 3.5–5.1)
Sodium: 133 mmol/L — ABNORMAL LOW (ref 135–145)
Total Bilirubin: 1.3 mg/dL — ABNORMAL HIGH (ref 0.3–1.2)
Total Protein: 5.6 g/dL — ABNORMAL LOW (ref 6.5–8.1)

## 2020-10-17 LAB — GLUCOSE, CAPILLARY
Glucose-Capillary: 274 mg/dL — ABNORMAL HIGH (ref 70–99)
Glucose-Capillary: 192 mg/dL — ABNORMAL HIGH (ref 70–99)
Glucose-Capillary: 219 mg/dL — ABNORMAL HIGH (ref 70–99)
Glucose-Capillary: 168 mg/dL — ABNORMAL HIGH (ref 70–99)

## 2020-10-17 LAB — PHOSPHORUS: Phosphorus: 2.7 mg/dL (ref 2.5–4.6)

## 2020-10-17 LAB — CBC WITH DIFFERENTIAL/PLATELET
Abs Immature Granulocytes: 0.15 10*3/uL — ABNORMAL HIGH (ref 0.00–0.07)
Basophils Absolute: 0 10*3/uL (ref 0.0–0.1)
Basophils Relative: 0 %
Eosinophils Absolute: 0 10*3/uL (ref 0.0–0.5)
Eosinophils Relative: 0 %
HCT: 39.7 % (ref 36.0–46.0)
Hemoglobin: 13.9 g/dL (ref 12.0–15.0)
Immature Granulocytes: 2 %
Lymphocytes Relative: 10 %
Lymphs Abs: 1 10*3/uL (ref 0.7–4.0)
MCH: 29.3 pg (ref 26.0–34.0)
MCHC: 35 g/dL (ref 30.0–36.0)
MCV: 83.6 fL (ref 80.0–100.0)
Monocytes Absolute: 1 10*3/uL (ref 0.1–1.0)
Monocytes Relative: 11 %
Neutro Abs: 7.6 10*3/uL (ref 1.7–7.7)
Neutrophils Relative %: 77 %
Platelets: 217 10*3/uL (ref 150–400)
RBC: 4.75 MIL/uL (ref 3.87–5.11)
RDW: 11.6 % (ref 11.5–15.5)
WBC: 9.7 10*3/uL (ref 4.0–10.5)
nRBC: 0.2 % (ref 0.0–0.2)

## 2020-10-17 LAB — MAGNESIUM: Magnesium: 1.9 mg/dL (ref 1.7–2.4)

## 2020-10-17 MED ORDER — CARBAMIDE PEROXIDE 6.5 % OT SOLN
5.0000 [drp] | Freq: Two times a day (BID) | OTIC | Status: DC
  Administered 2020-10-17 – 2020-10-20 (×7): 5 [drp] via OTIC
  Filled 2020-10-17: qty 15

## 2020-10-17 NOTE — Progress Notes (Signed)
Physical Therapy Treatment Patient Details Name: Regina Mckay MRN: [id number] DOB: [birth year] Today's Date: 10/17/2020    History of Present Illness Pt is a 70 y.o. female who presented 10/09/20 with R hand weakness and speech disturbance. Per chart, weakness in hand began 3 days PTA, waxing and waning. MRI revealed dural based lesion in the L middle cranial fossa most consistent with meningioma and causing mild displacement of adjacent temporal lobe. EEG suggestive of nonspecific cortical dysfunction in L temporal region. 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 CT head 3/14 showing hematoma in the tumor bed inferior to the left temporal lobe.PMH: DM2, HTN, BMI 41.    PT Comments    Attempted session x3 this afternoon with pt sleeping 1st attempt and too groggy to participate 2nd attempt. Pt frustrated with sad and flat affect upon 3rd attempt, utilizing head nods and shakes or one word responses to express herself. Pt agreed that she was frustrated at the situation and not feeling well and therefore did not want to get out of bed or exercise today. Instead, focused session on educating pt and her daughter on importance of continual/persistant mobility to encourage nueromuscular learning and prevent muscle atrophy. Also educated them on listening to music and playing ipad or card games to challenge the mind. Further educated pt and her daughter on initiating motor patterns/coordination in her mouth to improve her expressive language by singing to songs, stating common/automatic phrases, and trying 1-2 syllable words initially. Recommending SLP consult. Will continue to follow acutely. Current recommendations remain appropriate.   Follow Up Recommendations  Supervision for mobility/OOB;Outpatient PT     Equipment Recommendations  None recommended by PT    Recommendations for Other Services Speech consult     Precautions / Restrictions  Precautions Precautions: Fall Precaution Comments: hemovac removed ( bandage only present) Restrictions Weight Bearing Restrictions: No    Mobility  Bed Mobility               General bed mobility comments: Pt denied.    Transfers                 General transfer comment: Pt denied.  Ambulation/Gait             General Gait Details: Pt denied.   Stairs             Wheelchair Mobility    Modified Rankin (Stroke Patients Only) Modified Rankin (Stroke Patients Only) Pre-Morbid Rankin Score: No symptoms Modified Rankin: Moderate disability     Balance                                            Cognition Arousal/Alertness: Awake/alert Behavior During Therapy: Flat affect Overall Cognitive Status: Impaired/Different from baseline Area of Impairment: Safety/judgement;Problem solving                         Safety/Judgement: Decreased awareness of safety;Decreased awareness of deficits   Problem Solving: Slow processing;Decreased initiation General Comments: Pt with flat affect, looking sad this date with her agreeing that she was frustrated. Pt not agreeable to getting out of bed or exercising this date.      Exercises      General Comments General comments (skin integrity, edema, etc.): Educated pt on importance of regular activity to prevent muscle atrophy and to  encourage neuromuscular learning. Pt and daughter educated on playing music or games and to begin with trying to speak 1 syllable words then 2-syllable etc. Pt and daughter educated on trying to sing along to songs or say common phrases to encourage expressive language.      Pertinent Vitals/Pain Pain Assessment: Faces Faces Pain Scale: Hurts a little bit Pain Location: generalized Pain Descriptors / Indicators: Grimacing Pain Intervention(s): Limited activity within patient's tolerance;Monitored during session    Home Living                       Prior Function            PT Goals (current goals can now be found in the care plan section) Acute Rehab PT Goals Patient Stated Goal: to rest PT Goal Formulation: With patient/family Time For Goal Achievement: 10/26/20 Potential to Achieve Goals: Good Progress towards PT goals: Not progressing toward goals - comment (pt denied to participate in exercises or mobility this date)    Frequency    Min 3X/week      PT Plan Current plan remains appropriate    Co-evaluation              AM-PAC PT "6 Clicks" Mobility   Outcome Measure  Help needed turning from your back to your side while in a flat bed without using bedrails?: None Help needed moving from lying on your back to sitting on the side of a flat bed without using bedrails?: None Help needed moving to and from a bed to a chair (including a wheelchair)?: None Help needed standing up from a chair using your arms (e.g., wheelchair or bedside chair)?: None Help needed to walk in hospital room?: None Help needed climbing 3-5 steps with a railing? : A Little 6 Click Score: 23    End of Session   Activity Tolerance: Treatment limited secondary to agitation;Other (comment) (pt unwilling to get out of bed) Patient left: in bed;with call bell/phone within reach;with bed alarm set;with family/visitor present   PT Visit Diagnosis: Unsteadiness on feet (R26.81);Other abnormalities of gait and mobility (R26.89);Muscle weakness (generalized) (M62.81)     Time: 1610-9604 PT Time Calculation (min) (ACUTE ONLY): 9 min  Charges:  $Therapeutic Activity: 8-22 mins                     Moishe Spice, PT, DPT Acute Rehabilitation Services  Pager: [phone] Office: [place] 10/17/2020, 5:48 PM

## 2020-10-17 NOTE — Progress Notes (Addendum)
PROGRESS NOTE    Regina Mckay  VVO:[id number] DOB: August 27, 1950 DOA: 10/09/2020 PCP: Jani Gravel, MD   Chief Complaint  Patient presents with  . stroke like s/s    Brief Narrative:   Regina Mckay is a very pleasant right-handed 70 y.o. female with medical history significant for type 2 diabetes mellitus, hypertension, and BMI 76, now presenting to the emergency department for evaluation of right hand weakness and speech disturbance.   Imaging notable for dural based lesion of the left middle cranial fossal most c/w meningioma.  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  She's now s/p craniotomy for resection of meningioma.  Hospitalization complicated by increased difficulties with speech with word finding issues and CT findings showing hematoma.    Assessment & Plan:   Principal Problem:   Episode of transient neurologic symptoms Active Problems:   Uncontrolled diabetes mellitus with hyperglycemia (HCC)   Polycythemia   Hypertensive urgency   Speech abnormality  1. 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  - continue keppra 500 mg BID  -  S/p craniotomy for resection of meningioma, application of cranial navigation 3/11 - MRI 3/11 follow up with increased dural thickness and contrast enhancement in the anterior left middle cranial fossa extending along the sphenoid wing and tentorial incisura - postsurgical changes - neurosurgery c/s, appreciate recs - steroid taper - word finding difficulties noted on 3/14 (per nsgy note nurse and OT noted 3/13) -> CT head 3/14 with hematoma in tumor bed inferior to he L temporal lobe, low density in the L temporal bone which could represent edema or infarction.  Small subdural hematoma along  the L tentorium.  (see report) - CT head 3/15 with stable post op blood products and edema - EEG without seizure or epileptiform discharges -Await further recommendations from neurosurgery  # Delirium:  Per patient and family, seems to be doing better today.  Conversation appears to be appropriate.  2. Hypertensive urgency  - BP as high as 250/110 in ED in setting of anxiety and running out of her antihypertensives  - BP goal < 269 systolic - restart lisinopril, continue amlodipine - BP still on high side, add HCTZ 12.5 mg (adjust prn) - prn enalaprilat and labetalol   3. Uncontrolled type II DM  - A1c 13.6 - lantus, basal, bolus, SSI - adjust prn (worsened hyperglycemia with steroids, follow with taper).  Improved this AM, continue to monitor.  # Elevated Bilirubin Improving, continue to monitor - notably elevated in 2020 as well Follow outpatient, w/u further as indicated here  4. Polycythemia  - Hgb 17.9 on admission - now improved post op - Patient not hypovolemic, denies smoking or lung disease, denies chest pain, and no CVA on MRI  - Check serum epo -- elevated - recommending heme follow up outpatient - workup further outpatient  5. Right ear Cerumen impaction  -complained of muffled hearing from right ear -noted to have cerumen build up in right ear canal -visible TM appeared normal -started on debrox  Hasn't seen her PCP for several years, will probably need refills of most meds at discharged.  She's got appt with new doc in practice.   DVT prophylaxis: SCD Code Status: full  Family Communication: daughter at the bedside Disposition:  Status is: Inpatient  The patient will require care spanning > 2 midnights and should continue as inpatient because: Inpatient level of care appropriate due to severity of illness  Dispo: The patient is from: Home              Anticipated d/c is to: Home              Patient currently is not medically stable to d/c.    Difficult to place patient No       Consultants:   Neurology  neurosurgery  Procedures: 3/9 EEG : This study is suggestive of nonspecific cortical dysfunction in left temporal region.  No seizures or epileptiform discharges were seen throughout the recording  3/10 Left temporal CRANIOTOMY FOR RESECTION OF middle fossa MENINGIOMA - General    * APPLICATION OF CRANIAL NAVIGATION - General     Use of microscope for microdissection   Antimicrobials: Anti-infectives (From admission, onward)   Start     Dose/Rate Route Frequency Ordered Stop   10/12/20 0300  vancomycin (VANCOREADY) IVPB 750 mg/150 mL        750 mg 150 mL/hr over 60 Minutes Intravenous Every 12 hours 10/11/20 2057 10/13/20 0422   10/11/20 0600  vancomycin (VANCOREADY) IVPB 1500 mg/300 mL  Status:  Discontinued        1,500 mg 150 mL/hr over 120 Minutes Intravenous On call to O.R. 10/10/20 1319 10/11/20 2017         Subjective: Feels that speech is improving today, although not back to baseline. Her daughter feels that her speech is easier to comprehend today. She does note that her hearing in her right ear appears to be more muffled today.  Objective: Vitals:   10/16/20 2336 10/17/20 0329 10/17/20 0726 10/17/20 1215  BP: (!) 154/63 (!) 157/79 (!) 143/68 138/88  Pulse: 61 63 63 69  Resp: 17 18 15 19   Temp: 97.7 F (36.5 C) (!) 97.5 F (36.4 C) 98.2 F (36.8 C) 97.9 F (36.6 C)  TempSrc: Bladder Oral Oral Oral  SpO2: 95% 94% 95% 97%  Weight:      Height:        Intake/Output Summary (Last 24 hours) at 10/17/2020 1247 Last data filed at 10/17/2020 0800 Gross per 24 hour  Intake 200 ml  Output -  Net 200 ml   Filed Weights   10/09/20 1401  Weight: 108 kg    Examination:  General exam: Alert, awake, oriented x 3 HEENT: post op dressings in place, right ear canal with cerumen, TM is not bulging or red Respiratory system: Clear to auscultation. Respiratory effort normal. Cardiovascular  system:RRR. No murmurs, rubs, gallops. Gastrointestinal system: Abdomen is nondistended, soft and nontender. No organomegaly or masses felt. Normal bowel sounds heard. Central nervous system: Alert and oriented. No focal neurological deficits. Extremities: No C/C/E, +pedal pulses Skin: No rashes, lesions or ulcers Psychiatry: Judgement and insight appear normal. Mood & affect appropriate.      Data Reviewed: I have personally reviewed following labs and imaging studies  CBC: Recent Labs  Lab 10/13/20 0921 10/14/20 0410 10/15/20 0152 10/16/20 0102 10/17/20 0046  WBC 18.8* 13.2* 10.6* 6.8 9.7  NEUTROABS 17.9* 12.4* 9.1* 5.8 7.6  HGB 15.2* 13.6 13.8 14.3 13.9  HCT 45.7 40.2 41.9 42.8 39.7  MCV 88.1 86.5 86.6 86.1 83.6  PLT 226 213 206 204 347    Basic Metabolic Panel: Recent Labs  Lab 10/11/20 0603 10/11/20 1827 10/13/20 0921 10/14/20 0410 10/15/20 0152 10/16/20  0102 10/17/20 0046  NA 135   < > 136 136 136 133* 133*  K 4.3   < > 4.8 4.2 3.9 4.4 3.8  CL 99   < > 104 103 101 98 97*  CO2 27  --  18* 25 28 27 27   GLUCOSE 385*   < > 325* 292* 224* 218* 292*  BUN 16   < > 21 29* 21 22 26*  CREATININE 0.88   < > 0.89 0.81 0.82 0.62 0.81  CALCIUM 9.1  --  8.7* 8.4* 8.4* 8.3* 8.1*  MG 1.8  --   --  2.0 2.0 2.0 1.9  PHOS 4.6  --   --  2.6 2.5 2.8 2.7   < > = values in this interval not displayed.    GFR: Estimated Creatinine Clearance: 76.4 mL/min (by C-G formula based on SCr of 0.81 mg/dL).  Liver Function Tests: Recent Labs  Lab 10/13/20 0921 10/14/20 0410 10/15/20 0152 10/16/20 0102 10/17/20 0046  AST 17 15 15 22 15   ALT 14 14 12 13 15   ALKPHOS 62 51 47 47 45  BILITOT 2.2* 2.5* 1.8* 1.8* 1.3*  PROT 6.8 6.2* 5.8* 5.8* 5.6*  ALBUMIN 3.5 3.1* 2.9* 2.8* 2.7*    CBG: Recent Labs  Lab 10/16/20 1459 10/16/20 2021 10/16/20 2108 10/17/20 0724 10/17/20 1212  GLUCAP 250* 241* 206* 274* 192*     Recent Results (from the past 240 hour(s))  Resp Panel by  RT-PCR (Flu A&B, Covid) Nasopharyngeal Swab     Status: None   Collection Time: 10/09/20  9:09 PM   Specimen: Nasopharyngeal Swab; Nasopharyngeal(NP) swabs in vial transport medium  Result Value Ref Range Status   SARS Coronavirus 2 by RT PCR NEGATIVE NEGATIVE Final    Comment: (NOTE) SARS-CoV-2 target nucleic acids are NOT DETECTED.  The SARS-CoV-2 RNA is generally detectable in upper respiratory specimens during the acute phase of infection. The lowest concentration of SARS-CoV-2 viral copies this assay can detect is 138 copies/mL. A negative result does not preclude SARS-Cov-2 infection and should not be used as the sole basis for treatment or other patient management decisions. A negative result may occur with  improper specimen collection/handling, submission of specimen other than nasopharyngeal swab, presence of viral mutation(s) within the areas targeted by this assay, and inadequate number of viral copies(<138 copies/mL). A negative result must be combined with clinical observations, patient history, and epidemiological information. The expected result is Negative.  Fact Sheet for Patients:  EntrepreneurPulse.com.au  Fact Sheet for Healthcare Providers:  IncredibleEmployment.be  This test is no t yet approved or cleared by the [place] FDA and  has been authorized for detection and/or diagnosis of SARS-CoV-2 by FDA under an Emergency Use Authorization (EUA). This EUA will remain  in effect (meaning this test can be used) for the duration of the COVID-19 declaration under Section 564(b)(1) of the Act, 21 U.S.C.section 360bbb-3(b)(1), unless the authorization is terminated  or revoked sooner.       Influenza A by PCR NEGATIVE NEGATIVE Final   Influenza B by PCR NEGATIVE NEGATIVE Final    Comment: (NOTE) The Xpert Xpress SARS-CoV-2/FLU/RSV plus assay is intended as an aid in the diagnosis of influenza from Nasopharyngeal swab  specimens and should not be used as a sole basis for treatment. Nasal washings and aspirates are unacceptable for Xpert Xpress SARS-CoV-2/FLU/RSV testing.  Fact Sheet for Patients: EntrepreneurPulse.com.au  Fact Sheet for Healthcare Providers: IncredibleEmployment.be  This test is not yet approved or  cleared by the [place] and has been authorized for detection and/or diagnosis of SARS-CoV-2 by FDA under an Emergency Use Authorization (EUA). This EUA will remain in effect (meaning this test can be used) for the duration of the COVID-19 declaration under Section 564(b)(1) of the Act, 21 U.S.C. section 360bbb-3(b)(1), unless the authorization is terminated or revoked.  Performed at [facility], [place] [address] [place], [place] [address]          Radiology Studies: CT HEAD WO CONTRAST  Result Date: 10/16/2020 CLINICAL DATA:  Postop tumor resection EXAM: CT HEAD WITHOUT CONTRAST TECHNIQUE: Contiguous axial images were obtained from the base of the skull through the vertex without intravenous contrast. COMPARISON:  Yesterday FINDINGS: Brain: Left pterional craniotomy with left temporal lobe hemorrhage and edema is non progressed. The parenchymal hemorrhagic component continues to measure up to 3 cm. Small volume subdural gas and hemorrhage along the left convexity is also non progressed. No complicating infarct, entrapment, or progressive mass effect. Vascular: Negative Skull: Unremarkable left craniotomy. Sinuses/Orbits: Bilateral cataract resection IMPRESSION: Stable postoperative blood products and edema. Electronically Signed   By: Monte Fantasia M.D.   On: 10/16/2020 06:34   CT HEAD WO CONTRAST  Addendum Date: 10/15/2020   ADDENDUM REPORT: 10/15/2020 15:28 ADDENDUM: These results were called by telephone at the time of interpretation on 10/15/2020 at 3:27 pm to provider Trenton Gammon , who verbally acknowledged these results.  Electronically Signed   By: Franchot Gallo M.D.   On: 10/15/2020 15:28   Result Date: 10/15/2020 CLINICAL DATA:  Postop tumor resection EXAM: CT HEAD WITHOUT CONTRAST TECHNIQUE: Contiguous axial images were obtained from the base of the skull through the vertex without intravenous contrast. COMPARISON:  MRI head 10/12/2020.  CT head 10/09/2020 FINDINGS: Brain: Postop left temporal craniotomy for tumor resection. There is high-density hemorrhage in the tumor bed. Hematoma measures approximately 3.5 x 1.9 x 3.0 cm is located under the left temporal lobe in the tumor bed. There is edema in the adjacent left temporal lobe which could be infarction. Small amount of subdural blood and gas is present on the left underlying the craniotomy flap. There is a small amount of subdural hemorrhage along the left tentorium. Mild mass-effect on the left lateral ventricle has developed since prior studies. No hydrocephalus or midline shift. Vascular: Negative for hyperdense vessel Skull: Left craniotomy with replacement of the craniotomy flap in good position. Sinuses/Orbits: Paranasal sinuses clear. Left mastoid effusion is new since the prior CT. Other: None IMPRESSION: Postop left temporal craniotomy for tumor resection. There is a hematoma in the tumor bed inferior to the left temporal lobe. There is low-density in the left temporal bone which could represent edema or infarction. Small subdural hematoma along the left tentorium. Left mastoid effusion, new since the recent study. Craniotomy appears to extend through the anterior portion of the mastoid sinus on the left. Electronically Signed: By: Franchot Gallo M.D. On: 10/15/2020 15:14        Scheduled Meds: . amLODipine  10 mg Oral Daily  . carbamide peroxide  5 drop Right EAR BID  . Chlorhexidine Gluconate Cloth  6 each Topical Daily  . dexamethasone  4 mg Oral Q8H  . docusate sodium  100 mg Oral BID  . hydrochlorothiazide  12.5 mg Oral Daily  . insulin aspart   0-20 Units Subcutaneous TID WC  . insulin aspart  0-5 Units Subcutaneous QHS  . insulin aspart  13 Units Subcutaneous TID WC  .  insulin glargine  70 Units Subcutaneous Daily  . levETIRAcetam  500 mg Oral BID  . lisinopril  20 mg Oral Daily  . pantoprazole  40 mg Oral Daily  . pravastatin  40 mg Oral q1800   Continuous Infusions:    LOS: 7 days    Time spent: over 30 min    Kathie Dike, MD [facility]   To contact the attending provider between 7A-7P or the covering provider during after hours 7P-7A, please log into the web site [email or web address] and access using universal [place] password for that web site. If you do not have the password, please call the hospital operator.  10/17/2020, 12:47 PM

## 2020-10-17 NOTE — Progress Notes (Signed)
Inpatient Diabetes Program Recommendations  AACE/ADA: New Consensus Statement on Inpatient Glycemic Control (2015)  Target Ranges:  Prepandial:   less than 140 mg/dL      Peak postprandial:   less than 180 mg/dL (1-2 hours)      Critically ill patients:  140 - 180 mg/dL   Lab Results  Component Value Date   GLUCAP 274 (H) 10/17/2020   HGBA1C 13.6 (H) 10/10/2020    Review of Glycemic Control Results for Regina Mckay, Regina Mckay (MRN [id number]) as of 10/17/2020 08:42  Ref. Range 10/16/2020 07:49 10/16/2020 08:22 10/16/2020 12:17 10/16/2020 14:59 10/16/2020 20:21 10/16/2020 21:08 10/17/2020 07:24  Glucose-Capillary Latest Ref Range: 70 - 99 mg/dL 286 (H) 291 (H) 225 (H) 250 (H) 241 (H) 206 (H) 274 (H)   Diabetes history: DM 2 (was seeing Dr. Maudie Mercury, will make new appt with PCP in that office) Outpatient Diabetes medications: Glucovance 5-500 mg tid, Basaglar 50 units qhs Current orders for Inpatient glycemic control:  Lantus 70 units Novolog 0-20 units tid + hs Novolog 13 units tid meal coverage  Inpatient Diabetes Program Recommendations:    Decadron decreased again to 4 mg Q8 hours Watch on current regimen.  Spoke with pt in detail on 3/9. Pt was not on medication for awhile. She was not able to get refills due to the amount of time it had been since seeing a doctor.  D/c: Blood glucose meter kit order # [id number] Refill on Glucovance Refill on Amlodipine  Will follow pt. Educate again if needed closer to d/c.  Thanks,  Tama Headings RN, MSN, BC-ADM Inpatient Diabetes Coordinator Team Pager [phone] (8a-5p)

## 2020-10-18 DIAGNOSIS — R4701 Aphasia: Secondary | ICD-10-CM

## 2020-10-18 DIAGNOSIS — E1165 Type 2 diabetes mellitus with hyperglycemia: Secondary | ICD-10-CM | POA: Diagnosis not present

## 2020-10-18 DIAGNOSIS — I160 Hypertensive urgency: Secondary | ICD-10-CM | POA: Diagnosis not present

## 2020-10-18 DIAGNOSIS — I10 Essential (primary) hypertension: Secondary | ICD-10-CM | POA: Diagnosis not present

## 2020-10-18 LAB — GLUCOSE, CAPILLARY
Glucose-Capillary: 157 mg/dL — ABNORMAL HIGH (ref 70–99)
Glucose-Capillary: 190 mg/dL — ABNORMAL HIGH (ref 70–99)
Glucose-Capillary: 214 mg/dL — ABNORMAL HIGH (ref 70–99)
Glucose-Capillary: 270 mg/dL — ABNORMAL HIGH (ref 70–99)

## 2020-10-18 NOTE — Progress Notes (Signed)
Physical Therapy Treatment Patient Details Name: Regina Mckay MRN: [id number] DOB: May 04, 1951 Today's Date: 10/18/2020    History of Present Illness Pt is a 70 y.o. female who presented 10/09/20 with R hand weakness and speech disturbance. Per chart, weakness in hand began 3 days PTA, waxing and waning. MRI revealed dural based lesion in the L middle cranial fossa most consistent with meningioma and causing mild displacement of adjacent temporal lobe. EEG suggestive of nonspecific cortical dysfunction in L temporal region. 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 CT head 3/14 showing hematoma in the tumor bed inferior to the left temporal lobe.PMH: DM2, HTN, BMI 41.    PT Comments    Pt agreeable to session this afternoon, ambulating on even surfaces without AD and navigating several stairs with 1 rail with min guard-supervision for all. Pt is at risk for falls as she is displaying mild unsteadiness with gait through intermittent LOB towards L when on L stance phase, but pt tends to be able to recover her balance without external physical assistance. Except 1x in which she was reaching off BOS to make bed and had LOB to R needing minA to recover. Pt would benefit from balance interventions to address these deficits. Improved expressive capabilities with more words per statement this date, but she continues to say yes or no or other words when she means otherwise. Will continue to follow acutely. Current recommendations remain appropriate.    Follow Up Recommendations  Supervision for mobility/OOB;Outpatient PT     Equipment Recommendations  None recommended by PT    Recommendations for Other Services       Precautions / Restrictions Precautions Precautions: Fall Precaution Comments: hemovac removed ( bandage only present); HOH (talk into R ear) Restrictions Weight Bearing Restrictions: No    Mobility  Bed Mobility Overal bed mobility: Modified  Independent             General bed mobility comments: Pt able to perform all bed mobility with mod I safely.    Transfers Overall transfer level: Needs assistance Equipment used: None Transfers: Sit to/from Stand Sit to Stand: Supervision         General transfer comment: Supervision for safety as pt displays slight imbalance with coming to stand.  Ambulation/Gait Ambulation/Gait assistance: Supervision;Min guard Gait Distance (Feet): 400 Feet Assistive device: None Gait Pattern/deviations: Step-through pattern;Decreased stride length;Wide base of support;Decreased stance time - right;Staggering left Gait velocity: decreased Gait velocity interpretation: <1.8 ft/sec, indicate of risk for recurrent falls General Gait Details: Pt ambulates at slow pace with mild unsteadiness, displaying increased L stance time compared to R but staggering/LOB to L with L stance majority of time, min guard-supervision for safety and recovery of balance. 1x LOB to R when reaching off BOS needing minA to recover. Demonstrates R foot external rotation with gait.   Stairs Stairs: Yes Stairs assistance: Min guard Stair Management: One rail Right;Step to pattern Number of Stairs: 3 General stair comments: Ascends and descends without over LOB, min guard for safety. Extra time for safety.   Wheelchair Mobility    Modified Rankin (Stroke Patients Only) Modified Rankin (Stroke Patients Only) Pre-Morbid Rankin Score: No symptoms Modified Rankin: Moderate disability     Balance Overall balance assessment: Needs assistance Sitting-balance support: No upper extremity supported;Feet supported Sitting balance-Leahy Scale: Fair Sitting balance - Comments: Static sitting EOB no LOB, supervision for safety.   Standing balance support: During functional activity;No upper extremity supported Standing balance-Leahy Scale: Fair  Standing balance comment: No UE support for mobility, but intermittent  minor LOB to L with gait, min guard for recovery. LOB to R when reaching off BOS to make bed, needing minA to recover.                            Cognition Arousal/Alertness: Awake/alert Behavior During Therapy: Flat affect Overall Cognitive Status: Impaired/Different from baseline Area of Impairment: Memory;Following commands;Safety/judgement;Awareness;Problem solving                     Memory: Decreased recall of precautions;Decreased short-term memory Following Commands: Follows one step commands with increased time;Follows multi-step commands with increased time Safety/Judgement: Decreased awareness of safety;Decreased awareness of deficits Awareness: Anticipatory Problem Solving: Slow processing General Comments: pt with expressive difficulties, wavering during session. Pt needing rewording of phrases to cue her as pt with difficulty hearing and possible receptive aphasia also.      Exercises      General Comments        Pertinent Vitals/Pain Pain Assessment: 0-10 Pain Score: 0-No pain Faces Pain Scale: Hurts a little bit Pain Location: generalized Pain Descriptors / Indicators: Grimacing;Discomfort Pain Intervention(s): Monitored during session    Home Living     Available Help at Discharge: Family;Available 24 hours/day Type of Home: Apartment              Prior Function            PT Goals (current goals can now be found in the care plan section) Acute Rehab PT Goals Patient Stated Goal: to go home PT Goal Formulation: With patient/family Time For Goal Achievement: 10/26/20 Potential to Achieve Goals: Good Progress towards PT goals: Progressing toward goals    Frequency    Min 3X/week      PT Plan Current plan remains appropriate    Co-evaluation              AM-PAC PT "6 Clicks" Mobility   Outcome Measure  Help needed turning from your back to your side while in a flat bed without using bedrails?: None Help  needed moving from lying on your back to sitting on the side of a flat bed without using bedrails?: None Help needed moving to and from a bed to a chair (including a wheelchair)?: A Little Help needed standing up from a chair using your arms (e.g., wheelchair or bedside chair)?: A Little Help needed to walk in hospital room?: A Little Help needed climbing 3-5 steps with a railing? : A Little 6 Click Score: 20    End of Session Equipment Utilized During Treatment: Gait belt Activity Tolerance: Patient tolerated treatment well Patient left: in bed;with call bell/phone within reach;with bed alarm set;with family/visitor present Nurse Communication: Mobility status PT Visit Diagnosis: Unsteadiness on feet (R26.81);Other abnormalities of gait and mobility (R26.89);Muscle weakness (generalized) (M62.81)     Time: 7412-8786 PT Time Calculation (min) (ACUTE ONLY): 28 min  Charges:  $Gait Training: 23-37 mins                     Moishe Spice, PT, DPT Acute Rehabilitation Services  Pager: [phone] Office: [place] 10/18/2020, 3:57 PM

## 2020-10-18 NOTE — Progress Notes (Signed)
Patient ID: Regina Mckay, female   DOB: 07-04-1951, 70 y.o.   MRN: [id number] BP 137/79 (BP Location: Right Arm)   Pulse 76   Temp 98.6 F (37 C) (Oral)   Resp 13   Ht 5\' 4"  (1.626 m)   Wt 108 kg   SpO2 93%   BMI 40.85 kg/m  Alert and oriented to person, expressive aphasia, receptive also.  Moving all extremities well Head CT showed no change Language currently unchanged.  Wound is clean, dry, without signs of infection.

## 2020-10-18 NOTE — Evaluation (Signed)
Speech Language Pathology Evaluation Patient Details Name: Regina Mckay MRN: [id number] DOB: [birth year] Today's Date: 10/18/2020 Time: 1320-1400 SLP Time Calculation (min) (ACUTE ONLY): 40 min  Problem List:  Patient Active Problem List   Diagnosis Date Noted  . Speech abnormality 10/10/2020  . Episode of transient neurologic symptoms 10/09/2020  . Uncontrolled diabetes mellitus with hyperglycemia ([place]) 10/09/2020  . Polycythemia 10/09/2020  . Hypertensive urgency 10/09/2020  . Meningioma [facility])    Past Medical History:  Past Medical History:  Diagnosis Date  . Diabetes mellitus without complication ([place])   . Hypertension    Past Surgical History:  Past Surgical History:  Procedure Laterality Date  . APPLICATION OF CRANIAL NAVIGATION N/A 10/11/2020   Procedure: APPLICATION OF CRANIAL NAVIGATION;  Surgeon: [name], MD;  Location: [facility];  Service: Neurosurgery;  Laterality: N/A;  . CRANIOTOMY N/A 10/11/2020   Procedure: CRANIOTOMY FOR RESECTION OF MENINGIOMA;  Surgeon: [name], MD;  Location: [place];  Service: Neurosurgery;  Laterality: N/A;  . REPLACEMENT TOTAL KNEE    . TONSILLECTOMY     HPI:  70yo female admitted 10/09/20 with stroke symptoms including right hand weakness and speech disturbance. PMH: DM2, HTN, BMI 41, left temporal lobe meningioma, now s/p craniotomy for resection. Now with expressive aphasia   Assessment / Plan / Recommendation Clinical Impression  Pt seen at bedside for assessment of speech and language deficits. Pt's speech is fully intelligible without evidence of dysarthria or apraxia. Pt exhibits marked receptive and expressive deficits consistent with Wernicke's aphasia. Unable to fully assess cognition at this time, due to severity of language deficits. Receptively, pt exhibits difficulty with yes/no responses, providing "yes" to all questions. She follows 1-step verbal commands with intermittent repetition needed, but is unable to  follow 2+ step commands even with multimodal cues. Right/Left discrimination is also impaired, possibly due to the increased complexity of these instructions. Pt is able to identify body parts with visual cues needed intermittently. Expressively, pt had difficulty with automatic sequences except for counting. She did require mod assist for this task. Pt is able to repeat up to 6 syllable words, but is unable to repeat sentence length material consistently. She is unable to complete high probability phrases (up and ____) or answer responsive naming questions despite multimodal cues. She named objects around her room with 80% accuracy. Pt is unaware of her errors, frequently saying "I tell you what, I don't know what you want". Pt frustrates easily, given lack of awareness, but has marked deficits in both comprehension and expression. Continued ST intervention is recommended acutely, and at next venue of care to maximize effective communication and reduce caregiver burden.    SLP Assessment  SLP Recommendation/Assessment: Patient needs continued Speech Language Pathology Services  SLP Visit Diagnosis: Aphasia (R47.01)    Follow Up Recommendations  Other (comment) (continued ST intervention recommended at next level of care)    Frequency and Duration min 1 x/week  2 weeks      SLP Evaluation Cognition  Overall Cognitive Status: Difficult to assess (significant receptive and expressive aphasia) Arousal/Alertness: Awake/alert       Comprehension  Auditory Comprehension Overall Auditory Comprehension: Impaired Yes/No Questions: Impaired Basic Biographical Questions: 26-50% accurate Basic Immediate Environment Questions: 25-49% accurate Commands: Impaired One Step Basic Commands: 75-100% accurate Two Step Basic Commands: 0-24% accurate Conversation: Simple Interfering Components: Processing speed EffectiveTechniques: Visual/Gestural cues Visual Recognition/Discrimination Discrimination: Not  tested Reading Comprehension Reading Status: Not tested    Expression Expression Primary Mode  of Expression: Verbal Verbal Expression Overall Verbal Expression: Impaired Initiation: No impairment Automatic Speech: Counting (unable to demonstrate other automatic sequences even with multimodal cues) Level of Generative/Spontaneous Verbalization: Sentence Repetition: Impaired Level of Impairment: Sentence level Naming: Impairment Responsive: 0-25% accurate Confrontation: Within functional limits Verbal Errors: Phonemic paraphasias;Neologisms;Not aware of errors Non-Verbal Means of Communication: Not applicable Written Expression Dominant Hand: Right Written Expression: Not tested   Oral / Motor  Oral Motor/Sensory Function Overall Oral Motor/Sensory Function: Within functional limits Motor Speech Overall Motor Speech: Appears within functional limits for tasks assessed Respiration: Within functional limits Phonation: Normal Resonance: Within functional limits Articulation: Within functional limitis Intelligibility: Intelligible Motor Planning: Witnin functional limits Motor Speech Errors: Not applicable   GO                   Celia B. Quentin Ore, [facility], [place] Speech Language Pathologist Office: [phone] Pager: [phone]  Shonna Chock 10/18/2020, 2:15 PM

## 2020-10-18 NOTE — Progress Notes (Signed)
PT Cancellation Note  Patient Details Name: [name] MRN: [id number] DOB: [birth year]   Cancelled Treatment:    Reason Eval/Treat Not Completed: Other (comment). Pt had just fallen asleep to nap upon arrival. Family member in room and informed PT that pt had been up out of bed and walking to and from the bathroom and bathed today already. Educated family member to continue encouraging pt to get out of bed and walk with nursing staff. Family member requesting return later in day if not seen by OT or tomorrow. Will follow-up as time allows.   Moishe Spice, PT, DPT Acute Rehabilitation Services  Pager: [phone] Office: [place] 10/18/2020, 1:01 PM

## 2020-10-18 NOTE — Progress Notes (Signed)
PROGRESS NOTE    Regina Mckay  YNW:[id number] DOB: 1951-06-26 DOA: 10/09/2020 PCP: Jani Gravel, MD   Chief Complaint  Patient presents with  . stroke like s/s    Brief Narrative:   Regina Mckay is a very pleasant right-handed 70 y.o. female with medical history significant for type 2 diabetes mellitus, hypertension, and BMI 80, now presenting to the emergency department for evaluation of right hand weakness and speech disturbance.   Imaging notable for dural based lesion of the left middle cranial fossal most c/w meningioma.  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  She's now s/p craniotomy for resection of meningioma.  Hospitalization complicated by increased difficulties with speech with word finding issues and CT findings showing hematoma.    Assessment & Plan:   Principal Problem:   Episode of transient neurologic symptoms Active Problems:   Uncontrolled diabetes mellitus with hyperglycemia (HCC)   Polycythemia   Hypertensive urgency   Speech abnormality  1. 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  - continue keppra 500 mg BID  -  S/p craniotomy for resection of meningioma, application of cranial navigation 3/11 - MRI 3/11 follow up with increased dural thickness and contrast enhancement in the anterior left middle cranial fossa extending along the sphenoid wing and tentorial incisura - postsurgical changes - neurosurgery c/s, appreciate recs - steroid taper - word finding difficulties noted on 3/14 (per nsgy note nurse and OT noted 3/13) -> CT head 3/14 with hematoma in tumor bed inferior to he L temporal lobe, low density in the L temporal bone which could represent edema or infarction.  Small subdural hematoma along  the L tentorium.  (see report) - CT head 3/15 with stable post op blood products and edema - EEG without seizure or epileptiform discharges -Her expressive aphasia seems to be significantly worse today -Discussed with Dr. Christella Noa who will follow up on behalf of neurosurgery today -Defer further work up to neurosurgery  # Delirium:  Per patient and family, seems to be doing better today.  Conversation appears to be appropriate.  2. Hypertensive urgency  - BP as high as 250/110 in ED in setting of anxiety and running out of her antihypertensives  - BP goal < 657 systolic - restart lisinopril, continue amlodipine - BP still on high side, add HCTZ 12.5 mg (adjust prn) - prn enalaprilat and labetalol   3. Uncontrolled type II DM  - A1c 13.6 - lantus, basal, bolus, SSI - adjust prn (worsened hyperglycemia with steroids, follow with taper).  Improved this AM, continue to monitor.  # Elevated Bilirubin Improving, continue to monitor - notably elevated in 2020 as well Follow outpatient, w/u further as indicated here  4. Polycythemia  - Hgb 17.9 on admission - now improved post op - Patient not hypovolemic, denies smoking or lung disease, denies chest pain, and no CVA on MRI  - Check serum epo -- elevated - recommending heme follow up outpatient - workup further outpatient  5. Right ear Cerumen impaction  -complained of muffled hearing from right ear -noted to have cerumen build up in right ear canal -visible TM appeared normal -started on debrox   Hasn't seen her PCP for several years, will probably need refills of most meds at discharged.  She's  got appt with new doc in practice.   DVT prophylaxis: SCD Code Status: full  Family Communication: daughter at the bedside Disposition:   Status is: Inpatient  The patient will require care spanning > 2 midnights and should continue as inpatient because: Inpatient level of care appropriate due to severity of illness  Dispo: The  patient is from: Home              Anticipated d/c is to: Home              Patient currently is not medically stable to d/c.   Difficult to place patient No       Consultants:   Neurology  neurosurgery  Procedures: 3/9 EEG : This study is suggestive of nonspecific cortical dysfunction in left temporal region.  No seizures or epileptiform discharges were seen throughout the recording  3/10 Left temporal CRANIOTOMY FOR RESECTION OF middle fossa MENINGIOMA - General    * APPLICATION OF CRANIAL NAVIGATION - General     Use of microscope for microdissection   Antimicrobials: Anti-infectives (From admission, onward)   Start     Dose/Rate Route Frequency Ordered Stop   10/12/20 0300  vancomycin (VANCOREADY) IVPB 750 mg/150 mL        750 mg 150 mL/hr over 60 Minutes Intravenous Every 12 hours 10/11/20 2057 10/13/20 0422   10/11/20 0600  vancomycin (VANCOREADY) IVPB 1500 mg/300 mL  Status:  Discontinued        1,500 mg 150 mL/hr over 120 Minutes Intravenous On call to O.R. 10/10/20 1319 10/11/20 2017         Subjective: Speech is very difficult to comprehend today  Objective: Vitals:   10/17/20 1935 10/17/20 2314 10/18/20 0341 10/18/20 0717  BP: (!) 129/52 (!) 126/57 122/70 (!) 153/69  Pulse: 70 62 (!) 58 66  Resp: 18 17 16 12   Temp: 97.6 F (36.4 C) 98.1 F (36.7 C) 98.1 F (36.7 C) 98.3 F (36.8 C)  TempSrc: Oral Oral Oral Oral  SpO2: 93% 95% 94% 92%  Weight:      Height:        Intake/Output Summary (Last 24 hours) at 10/18/2020 1008 Last data filed at 10/17/2020 1421 Gross per 24 hour  Intake 420 ml  Output -  Net 420 ml   Filed Weights   10/09/20 1401  Weight: 108 kg    Examination:  General exam: Alert, awake, aphasic HEENT: post op dressings in place, right ear canal with cerumen, visible TM is not bulging or red Respiratory system: Clear to auscultation. Respiratory effort normal. Cardiovascular system:RRR. No murmurs, rubs,  gallops. Gastrointestinal system: Abdomen is nondistended, soft and nontender. No organomegaly or masses felt. Normal bowel sounds heard. Central nervous system: no motor deficits, she is aphasic Extremities: No C/C/E, +pedal pulses Skin: No rashes, lesions or ulcers Psychiatry: cannot comprehend her speech     Data Reviewed: I have personally reviewed following labs and imaging studies  CBC: Recent Labs  Lab 10/13/20 0921 10/14/20 0410 10/15/20 0152 10/16/20 0102 10/17/20 0046  WBC 18.8* 13.2* 10.6* 6.8 9.7  NEUTROABS 17.9* 12.4* 9.1* 5.8 7.6  HGB 15.2* 13.6 13.8 14.3 13.9  HCT 45.7 40.2 41.9 42.8 39.7  MCV 88.1 86.5 86.6 86.1 83.6  PLT 226 213 206 204 384    Basic Metabolic Panel: Recent Labs  Lab 10/13/20 0921 10/14/20 0410 10/15/20 0152 10/16/20 0102 10/17/20 0046  NA 136 136 136 133* 133*  K 4.8 4.2 3.9 4.4 3.8  CL 104 103 101 98 97*  CO2 18* 25 28 27 27   GLUCOSE 325* 292* 224* 218* 292*  BUN 21 29* 21 22 26*  CREATININE 0.89 0.81 0.82 0.62 0.81  CALCIUM 8.7* 8.4* 8.4* 8.3* 8.1*  MG  --  2.0 2.0 2.0 1.9  PHOS  --  2.6 2.5 2.8 2.7    GFR: Estimated Creatinine Clearance: 76.4 mL/min (by C-G formula based on SCr of 0.81 mg/dL).  Liver Function Tests: Recent Labs  Lab 10/13/20 0921 10/14/20 0410 10/15/20 0152 10/16/20 0102 10/17/20 0046  AST 17 15 15 22 15   ALT 14 14 12 13 15   ALKPHOS 62 51 47 47 45  BILITOT 2.2* 2.5* 1.8* 1.8* 1.3*  PROT 6.8 6.2* 5.8* 5.8* 5.6*  ALBUMIN 3.5 3.1* 2.9* 2.8* 2.7*    CBG: Recent Labs  Lab 10/17/20 0724 10/17/20 1212 10/17/20 1617 10/17/20 2137 10/18/20 0715  GLUCAP 274* 192* 219* 168* 157*     Recent Results (from the past 240 hour(s))  Resp Panel by RT-PCR (Flu A&B, Covid) Nasopharyngeal Swab     Status: None   Collection Time: 10/09/20  9:09 PM   Specimen: Nasopharyngeal Swab; Nasopharyngeal(NP) swabs in vial transport medium  Result Value Ref Range Status   SARS Coronavirus 2 by RT PCR NEGATIVE  NEGATIVE Final    Comment: (NOTE) SARS-CoV-2 target nucleic acids are NOT DETECTED.  The SARS-CoV-2 RNA is generally detectable in upper respiratory specimens during the acute phase of infection. The lowest concentration of SARS-CoV-2 viral copies this assay can detect is 138 copies/mL. A negative result does not preclude SARS-Cov-2 infection and should not be used as the sole basis for treatment or other patient management decisions. A negative result may occur with  improper specimen collection/handling, submission of specimen other than nasopharyngeal swab, presence of viral mutation(s) within the areas targeted by this assay, and inadequate number of viral copies(<138 copies/mL). A negative result must be combined with clinical observations, patient history, and epidemiological information. The expected result is Negative.  Fact Sheet for Patients:  EntrepreneurPulse.com.au  Fact Sheet for Healthcare Providers:  IncredibleEmployment.be  This test is no t yet approved or cleared by the [place] FDA and  has been authorized for detection and/or diagnosis of SARS-CoV-2 by FDA under an Emergency Use Authorization (EUA). This EUA will remain  in effect (meaning this test can be used) for the duration of the COVID-19 declaration under Section 564(b)(1) of the Act, 21 U.S.C.section 360bbb-3(b)(1), unless the authorization is terminated  or revoked sooner.       Influenza A by PCR NEGATIVE NEGATIVE Final   Influenza B by PCR NEGATIVE NEGATIVE Final    Comment: (NOTE) The Xpert Xpress SARS-CoV-2/FLU/RSV plus assay is intended as an aid in the diagnosis of influenza from Nasopharyngeal swab specimens and should not be used as a sole basis for treatment. Nasal washings and aspirates are unacceptable for Xpert Xpress SARS-CoV-2/FLU/RSV testing.  Fact Sheet for Patients: EntrepreneurPulse.com.au  Fact Sheet for Healthcare  Providers: IncredibleEmployment.be  This test is not yet approved or cleared by the [place] FDA and has been authorized for detection and/or diagnosis of SARS-CoV-2 by FDA under an Emergency Use Authorization (EUA). This EUA will remain in effect (meaning this test can be used) for the duration of the COVID-19 declaration under Section 564(b)(1) of the Act, 21 U.S.C. section 360bbb-3(b)(1), unless the authorization is terminated or revoked.  Performed at [facility], [place] [address], [place],  [address]  Radiology Studies: No results found.      Scheduled Meds: . amLODipine  10 mg Oral Daily  . carbamide peroxide  5 drop Right EAR BID  . Chlorhexidine Gluconate Cloth  6 each Topical Daily  . dexamethasone  4 mg Oral Q8H  . docusate sodium  100 mg Oral BID  . hydrochlorothiazide  12.5 mg Oral Daily  . insulin aspart  0-20 Units Subcutaneous TID WC  . insulin aspart  0-5 Units Subcutaneous QHS  . insulin aspart  13 Units Subcutaneous TID WC  . insulin glargine  70 Units Subcutaneous Daily  . levETIRAcetam  500 mg Oral BID  . lisinopril  20 mg Oral Daily  . pantoprazole  40 mg Oral Daily  . pravastatin  40 mg Oral q1800   Continuous Infusions:    LOS: 8 days    Time spent: 35 min    Kathie Dike, MD [facility]   To contact the attending provider between 7A-7P or the covering provider during after hours 7P-7A, please log into the web site [email or web address] and access using universal [place] password for that web site. If you do not have the password, please call the hospital operator.  10/18/2020, 10:08 AM

## 2020-10-19 LAB — GLUCOSE, CAPILLARY
Glucose-Capillary: 206 mg/dL — ABNORMAL HIGH (ref 70–99)
Glucose-Capillary: 308 mg/dL — ABNORMAL HIGH (ref 70–99)
Glucose-Capillary: 310 mg/dL — ABNORMAL HIGH (ref 70–99)
Glucose-Capillary: 160 mg/dL — ABNORMAL HIGH (ref 70–99)

## 2020-10-19 MED ORDER — LISINOPRIL 20 MG PO TABS
30.0000 mg | ORAL_TABLET | Freq: Every day | ORAL | Status: DC
  Administered 2020-10-20: 30 mg via ORAL
  Filled 2020-10-19: qty 1

## 2020-10-19 MED ORDER — DEXAMETHASONE 4 MG PO TABS
4.0000 mg | ORAL_TABLET | Freq: Two times a day (BID) | ORAL | Status: DC
  Administered 2020-10-19 – 2020-10-20 (×2): 4 mg via ORAL
  Filled 2020-10-19 (×2): qty 1

## 2020-10-19 NOTE — Progress Notes (Signed)
Subjective: Patient reports no HA  Objective: Vital signs in last 24 hours: Temp:  [97.9 F (36.6 C)-98.6 F (37 C)] 98.2 F (36.8 C) (03/18 0736) Pulse Rate:  [63-85] 65 (03/18 0736) Resp:  [12-20] 12 (03/18 0736) BP: (123-160)/(56-81) 123/60 (03/18 0736) SpO2:  [93 %-98 %] 97 % (03/18 0736)  Intake/Output from previous day: 03/17 0701 - 03/18 0700 In: 600 [P.O.:600] Out: -  Intake/Output this shift: No intake/output data recorded.  Awake alert FC x 4 but saying only simple phrases (okay, yes, no, all right), and difficulty following more complex commands No drift Incision c/d  Lab Results: Recent Labs    10/17/20 0046  WBC 9.7  HGB 13.9  HCT 39.7  PLT 217   BMET Recent Labs    10/17/20 0046  NA 133*  K 3.8  CL 97*  CO2 27  GLUCOSE 292*  BUN 26*  CREATININE 0.81  CALCIUM 8.1*    Studies/Results: No results found.  Assessment/Plan: 70 yo F s/p left temporal lobe meningioma resection - cont supportive care - cont Keppra - steroid taper over 2 weeks - hold DVT ppx - will likely be several weeks for her  temporal lobe edema to decrease and speech to recover, but overall prognosis of speech recovery is likely good   LOS: 9 days     [name] 10/19/2020, 8:09 AM

## 2020-10-19 NOTE — Progress Notes (Signed)
PROGRESS NOTE    Regina Mckay  Regina Mckay:[id number] DOB: 11-Aug-1950 DOA: 10/09/2020 PCP: Jani Gravel, MD   Chief Complaint  Patient presents with  . stroke like s/s    Brief Narrative:   TAHJAE Mckay is a very pleasant right-handed 70 y.o. female with medical history significant for type 2 diabetes mellitus, hypertension, and BMI 18, now presenting to the emergency department for evaluation of right hand weakness and speech disturbance.   Imaging notable for dural based lesion of the left middle cranial fossal most c/w meningioma.  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  She's now s/p craniotomy for resection of meningioma.  Hospitalization complicated by increased difficulties with speech with word finding issues and CT findings showing hematoma.    Assessment & Plan:   Principal Problem:   Episode of transient neurologic symptoms Active Problems:   Uncontrolled diabetes mellitus with hyperglycemia (HCC)   Polycythemia   Hypertensive urgency   Speech abnormality  1. 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  - continue keppra 500 mg BID  -  S/p craniotomy for resection of meningioma, application of cranial navigation 3/11 - MRI 3/11 follow up with increased dural thickness and contrast enhancement in the anterior left middle cranial fossa extending along the sphenoid wing and tentorial incisura - postsurgical changes - neurosurgery c/s, appreciate recs - steroid taper - word finding difficulties noted on 3/14 (per nsgy note nurse and OT noted 3/13) -> CT head 3/14 with hematoma in tumor bed inferior to he L temporal lobe, low density in the L temporal bone which could represent edema or infarction.  Small subdural hematoma along  the L tentorium.  (see report) - CT head 3/15 with stable post op blood products and edema - EEG without seizure or epileptiform discharges -Suspect that her aphasia is related to edema noted on CT head -currently on steroid taper -will likely take a few weeks for aphasia to improve   2. Hypertensive urgency  - BP as high as 250/110 in ED in setting of anxiety and running out of her antihypertensives  - BP goal < 314 systolic - currently on amlodipine, lisinopril and HCTZ - BP still on high side at times, will increase lisinopril dosing - prn enalaprilat and labetalol   3. Uncontrolled type II DM  - A1c 13.6 - lantus, basal, bolus, SSI - adjust prn (worsened hyperglycemia with steroids, follow with taper).  Improved this AM, continue to monitor.  # Elevated Bilirubin Improving, continue to monitor - notably elevated in 2020 as well Follow outpatient, w/u further as indicated here  4. Polycythemia  - Hgb 17.9 on admission - now improved post op - Patient not hypovolemic, denies smoking or lung disease, denies chest pain, and no CVA on MRI  - Check serum epo -- elevated - recommending heme follow up outpatient - workup further outpatient  5. Right ear Cerumen impaction  -complained of muffled hearing from right ear -noted to have cerumen build up in right ear canal -visible TM appeared normal -started on debrox   Hasn't seen her PCP for several years, will probably need refills of most meds at discharged.  She's got appt with new doc in practice.   DVT prophylaxis: SCD Code Status: full  Family Communication: daughter  at the bedside Disposition: patient will return home on discharge. Will plan on discharge when cleared by neurosurgery.  Status is: Inpatient  The patient will require care spanning > 2 midnights and should continue as inpatient because: Inpatient level of care appropriate due to severity of illness  Dispo: The patient is from: Home               Anticipated d/c is to: Home              Patient currently is not medically stable to d/c.   Difficult to place patient No       Consultants:   Neurology  neurosurgery  Procedures: 3/9 EEG : This study is suggestive of nonspecific cortical dysfunction in left temporal region.  No seizures or epileptiform discharges were seen throughout the recording  3/10 Left temporal CRANIOTOMY FOR RESECTION OF middle fossa MENINGIOMA - General    * APPLICATION OF CRANIAL NAVIGATION - General     Use of microscope for microdissection   Antimicrobials: Anti-infectives (From admission, onward)   Start     Dose/Rate Route Frequency Ordered Stop   10/12/20 0300  vancomycin (VANCOREADY) IVPB 750 mg/150 mL        750 mg 150 mL/hr over 60 Minutes Intravenous Every 12 hours 10/11/20 2057 10/13/20 0422   10/11/20 0600  vancomycin (VANCOREADY) IVPB 1500 mg/300 mL  Status:  Discontinued        1,500 mg 150 mL/hr over 120 Minutes Intravenous On call to O.R. 10/10/20 1319 10/11/20 2017         Subjective: Sitting up in chair. Speech is more coherent today, although still searching for words at times  Objective: Vitals:   10/19/20 0300 10/19/20 0355 10/19/20 0408 10/19/20 0736  BP:   (!) 153/64 123/60  Pulse: 63 65 76 65  Resp: 17 13 19 12   Temp:   97.9 F (36.6 C) 98.2 F (36.8 C)  TempSrc:   Oral Oral  SpO2: 95% 96% 98% 97%  Weight:      Height:        Intake/Output Summary (Last 24 hours) at 10/19/2020 1029 Last data filed at 10/19/2020 0900 Gross per 24 hour  Intake 700 ml  Output -  Net 700 ml   Filed Weights   10/09/20 1401  Weight: 108 kg    Examination:  General exam: Alert, awake, aphasic HEENT: post op dressings in place, right ear canal with cerumen, visible TM is not bulging or red Respiratory system: Clear to auscultation. Respiratory effort normal. Cardiovascular system:RRR. No murmurs, rubs, gallops. Gastrointestinal system: Abdomen is nondistended, soft and  nontender. No organomegaly or masses felt. Normal bowel sounds heard. Central nervous system: no motor deficits, aphasia is better today, forming short sentences Extremities: No C/C/E, +pedal pulses Skin: No rashes, lesions or ulcers Psychiatry: speech is more coherent today     Data Reviewed: I have personally reviewed following labs and imaging studies  CBC: Recent Labs  Lab 10/13/20 0921 10/14/20 0410 10/15/20 0152 10/16/20 0102 10/17/20 0046  WBC 18.8* 13.2* 10.6* 6.8 9.7  NEUTROABS 17.9* 12.4* 9.1* 5.8 7.6  HGB 15.2* 13.6 13.8 14.3 13.9  HCT 45.7 40.2 41.9 42.8 39.7  MCV 88.1 86.5 86.6 86.1 83.6  PLT 226 213 206 204 737    Basic Metabolic Panel: Recent Labs  Lab 10/13/20 0921 10/14/20 0410 10/15/20 0152 10/16/20 0102 10/17/20 0046  NA 136 136 136 133* 133*  K 4.8 4.2 3.9 4.4 3.8  CL  104 103 101 98 97*  CO2 18* 25 28 27 27   GLUCOSE 325* 292* 224* 218* 292*  BUN 21 29* 21 22 26*  CREATININE 0.89 0.81 0.82 0.62 0.81  CALCIUM 8.7* 8.4* 8.4* 8.3* 8.1*  MG  --  2.0 2.0 2.0 1.9  PHOS  --  2.6 2.5 2.8 2.7    GFR: Estimated Creatinine Clearance: 76.4 mL/min (by C-G formula based on SCr of 0.81 mg/dL).  Liver Function Tests: Recent Labs  Lab 10/13/20 0921 10/14/20 0410 10/15/20 0152 10/16/20 0102 10/17/20 0046  AST 17 15 15 22 15   ALT 14 14 12 13 15   ALKPHOS 62 51 47 47 45  BILITOT 2.2* 2.5* 1.8* 1.8* 1.3*  PROT 6.8 6.2* 5.8* 5.8* 5.6*  ALBUMIN 3.5 3.1* 2.9* 2.8* 2.7*    CBG: Recent Labs  Lab 10/18/20 0715 10/18/20 1118 10/18/20 1630 10/18/20 2114 10/19/20 0735  GLUCAP 157* 190* 214* 270* 206*     Recent Results (from the past 240 hour(s))  Resp Panel by RT-PCR (Flu A&B, Covid) Nasopharyngeal Swab     Status: None   Collection Time: 10/09/20  9:09 PM   Specimen: Nasopharyngeal Swab; Nasopharyngeal(NP) swabs in vial transport medium  Result Value Ref Range Status   SARS Coronavirus 2 by RT PCR NEGATIVE NEGATIVE Final    Comment:  (NOTE) SARS-CoV-2 target nucleic acids are NOT DETECTED.  The SARS-CoV-2 RNA is generally detectable in upper respiratory specimens during the acute phase of infection. The lowest concentration of SARS-CoV-2 viral copies this assay can detect is 138 copies/mL. A negative result does not preclude SARS-Cov-2 infection and should not be used as the sole basis for treatment or other patient management decisions. A negative result may occur with  improper specimen collection/handling, submission of specimen other than nasopharyngeal swab, presence of viral mutation(s) within the areas targeted by this assay, and inadequate number of viral copies(<138 copies/mL). A negative result must be combined with clinical observations, patient history, and epidemiological information. The expected result is Negative.  Fact Sheet for Patients:  EntrepreneurPulse.com.au  Fact Sheet for Healthcare Providers:  IncredibleEmployment.be  This test is no t yet approved or cleared by the [place] FDA and  has been authorized for detection and/or diagnosis of SARS-CoV-2 by FDA under an Emergency Use Authorization (EUA). This EUA will remain  in effect (meaning this test can be used) for the duration of the COVID-19 declaration under Section 564(b)(1) of the Act, 21 U.S.C.section 360bbb-3(b)(1), unless the authorization is terminated  or revoked sooner.       Influenza A by PCR NEGATIVE NEGATIVE Final   Influenza B by PCR NEGATIVE NEGATIVE Final    Comment: (NOTE) The Xpert Xpress SARS-CoV-2/FLU/RSV plus assay is intended as an aid in the diagnosis of influenza from Nasopharyngeal swab specimens and should not be used as a sole basis for treatment. Nasal washings and aspirates are unacceptable for Xpert Xpress SARS-CoV-2/FLU/RSV testing.  Fact Sheet for Patients: EntrepreneurPulse.com.au  Fact Sheet for Healthcare  Providers: IncredibleEmployment.be  This test is not yet approved or cleared by the [place] FDA and has been authorized for detection and/or diagnosis of SARS-CoV-2 by FDA under an Emergency Use Authorization (EUA). This EUA will remain in effect (meaning this test can be used) for the duration of the COVID-19 declaration under Section 564(b)(1) of the Act, 21 U.S.C. section 360bbb-3(b)(1), unless the authorization is terminated or revoked.  Performed at [facility], [place] [address] [place], [place] [address]  Radiology Studies: No results found.      Scheduled Meds: . amLODipine  10 mg Oral Daily  . carbamide peroxide  5 drop Right EAR BID  . Chlorhexidine Gluconate Cloth  6 each Topical Daily  . dexamethasone  4 mg Oral Q12H  . docusate sodium  100 mg Oral BID  . hydrochlorothiazide  12.5 mg Oral Daily  . insulin aspart  0-20 Units Subcutaneous TID WC  . insulin aspart  0-5 Units Subcutaneous QHS  . insulin aspart  13 Units Subcutaneous TID WC  . insulin glargine  70 Units Subcutaneous Daily  . levETIRAcetam  500 mg Oral BID  . lisinopril  20 mg Oral Daily  . pantoprazole  40 mg Oral Daily  . pravastatin  40 mg Oral q1800   Continuous Infusions:    LOS: 9 days    Time spent: 35 min    Kathie Dike, MD [facility]   To contact the attending provider between 7A-7P or the covering provider during after hours 7P-7A, please log into the web site [email or web address] and access using universal [place] password for that web site. If you do not have the password, please call the hospital operator.  10/19/2020, 10:29 AM

## 2020-10-19 NOTE — Progress Notes (Signed)
Patient ID: Regina Mckay, female   DOB: Nov 20, 1950, 70 y.o.   MRN: [id number] BP (!) 124/55 (BP Location: Right Arm)   Pulse 78   Temp 98.3 F (36.8 C) (Oral)   Resp 16   Ht 5\' 4"  (1.626 m)   Wt 108 kg   SpO2 98%   BMI 40.85 kg/m  Alert oriented Expressive, receptive aphasia Wound is clean, dry, no signs of infection  Slow improvement.

## 2020-10-19 NOTE — Progress Notes (Signed)
  Speech Language Pathology Treatment: Cognitive-Linquistic  Patient Details Name: Regina Mckay MRN: [id number] DOB: 23-Oct-1950 Today's Date: 10/19/2020 Time: 5462-7035 SLP Time Calculation (min) (ACUTE ONLY): 27 min  Assessment / Plan / Recommendation Clinical Impression  Pt's sister at bedside during treatment and education re: pt's Wernicke's aphasia. Accuracy of responses and communicative intent improved with limited subjects, information in context, use of gestures and inconsistently written material. She communicated with paraphasia x 1 that her sister just had shoulder surgery. Use of written words yes/no not effective. She accurately printed her first and last name. Therapist provided education with sister for communication strategies, 24 hour supervision and recommendation of outpatient therapy.    HPI HPI: 70yo female admitted 10/09/20 with stroke symptoms including right hand weakness and speech disturbance. PMH: DM2, HTN, BMI 41, left temporal lobe meningioma, now s/p craniotomy for resection. Now with expressive aphasia      SLP Plan  Continue with current plan of care       Recommendations                   Oral Care Recommendations: Oral care BID Follow up Recommendations: Outpatient SLP SLP Visit Diagnosis: Aphasia (R47.01) Plan: Continue with current plan of care                       Houston Siren 10/19/2020, 3:48 PM Orbie Pyo Colvin Caroli.Ed Risk analyst [phone] Office [phone]

## 2020-10-19 NOTE — TOC Initial Note (Signed)
Transition of Care [facility]) - Initial/Assessment Note    Patient Details  Name: Regina Mckay MRN: [id number] Date of Birth: Apr 16, 1951  Transition of Care [facility]) CM/SW Contact:    Ella Bodo, RN Phone Number: 10/19/2020, 2:12 PM  Clinical Narrative: Pt is a 70 y.o. female who presented 10/09/20 with R hand weakness and speech disturbance. Per chart, weakness in hand began 3 days PTA, waxing and waning. MRI revealed dural based lesion in the L middle cranial fossa most consistent with meningioma and causing mild displacement of adjacent temporal lobe. EEG suggestive of nonspecific cortical dysfunction in L temporal region. 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  Prior to admission, patient independent and living at home alone.  PT/OT/ST recommending outpatient follow-up.  Sister at bedside; she is requesting tub bench for home, and is willing to pay out-of-pocket, as it is not covered by insurance.  Patient oriented to self, and waves hello to [facility] case manager, but is not engaged in the conversation.  Patient's sister states she will be with her for 6 weeks after her discharge, and will be able to take her to outpatient therapy follow-up appointments.  Referrals made to [facility] for follow-up, and [facility] for DME.  Tub bench to be delivered to bedside prior to patient discharge.                 Expected Discharge Plan: OP Rehab Barriers to Discharge: Continued Medical Work up          Expected Discharge Plan and Services Expected Discharge Plan: OP Rehab   Discharge Planning Services: CM Consult   Living arrangements for the past 2 months: Apartment                 DME Arranged: Tub bench DME Agency: [employer] Date DME Agency Contacted: 10/19/20 Time DME Agency Contacted: 48 Representative spoke with at DME Agency: Freda Munro            Prior Living Arrangements/Services Living arrangements for the past 2 months:  Apartment Lives with:: Self Patient language and need for interpreter reviewed:: Yes Do you feel safe going back to the place where you live?: Yes      Need for Family Participation in Patient Care: Yes (Comment) Care giver support system in place?: Yes (comment)   Criminal Activity/Legal Involvement Pertinent to Current Situation/Hospitalization: No - Comment as needed               Emotional Assessment Appearance:: Appears stated age Attitude/Demeanor/Rapport: Guarded Affect (typically observed): Appropriate Orientation: : Oriented to Self      Admission diagnosis:  Meningioma ([place]) [D32.9] Preop cardiovascular exam [G81.856] Episode of transient neurologic symptoms [R29.90] Speech abnormality [R47.9] Essential hypertension [I10] Patient Active Problem List   Diagnosis Date Noted  . Speech abnormality 10/10/2020  . Episode of transient neurologic symptoms 10/09/2020  . Uncontrolled diabetes mellitus with hyperglycemia ([place]) 10/09/2020  . Polycythemia 10/09/2020  . Hypertensive urgency 10/09/2020  . Meningioma ([place])    PCP:  Jani Gravel, MD Pharmacy:   [facility] [place] [place], [address] [place] [address] Phone: [phone] Fax: [phone]  [facility] [phone] - [place], [place] - [address] [place] [address] Phone: [phone] Fax: [phone]     Social Determinants of Health (SDOH)  Interventions    Readmission Risk Interventions No flowsheet data found.  Reinaldo Raddle, RN, BSN  Trauma/Neuro ICU Case Manager [phone]

## 2020-10-19 NOTE — Progress Notes (Addendum)
Occupational Therapy Treatment Patient Details Name: Regina Mckay MRN: [id number] DOB: 19-Jun-1951 Today's Date: 10/19/2020    History of present illness Pt is a 70 y.o. female who presented 10/09/20 with R hand weakness and speech disturbance. Per chart, weakness in hand began 3 days PTA, waxing and waning. MRI revealed dural based lesion in the L middle cranial fossa most consistent with meningioma and causing mild displacement of adjacent temporal lobe. EEG suggestive of nonspecific cortical dysfunction in L temporal region. 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 CT head 3/14 showing hematoma in the tumor bed inferior to the left temporal lobe.PMH: DM2, HTN, BMI 41.   OT comments  Pt is able to write expressive answer better than trying to verbalize them this session. Pt repeating phrases "I live alone. I dont talk much" when trying to have conversation. Pt with phrased repeated back at times during session and often nodding head yes but other times making faces at therapist in frustration. Pt does not want sister to d/c home with her and believes she can stay alone. Pt lacks awareness or insight to deficits. Pt initially making faces at OT and states "No go away" OT building rapport by repeating facts about patient from previous session to show that we have met and that we had good rapport at those times. Pt states "oh" When Ot tells sister to make sure they do not give her melon on her tray because she hates melon pt states "yes" and laughs tapping OT with hand "yes"  Pt very much wants to shower and d/c home to her own clothing. Please place order for shower prior to d/c if allowed with staples.    Follow Up Recommendations  Outpatient OT    Equipment Recommendations  Tub/shower bench    Recommendations for Other Services      Precautions / Restrictions Precautions Precautions: Fall Precaution Comments: R eye with cotton ball this session        Mobility Bed Mobility Overal bed mobility: Modified Independent                  Transfers Overall transfer level: Needs assistance   Transfers: Sit to/from Stand Sit to Stand: Supervision              Balance Overall balance assessment: Mild deficits observed, not formally tested           Standing balance-Leahy Scale: Fair                 High Level Balance Comments: pathfinding task needs increased time but able to locate room today. pt states "they think i am crazy but im not" pt lacks awareness to expressive deficits. pt does state frustration that others are trying to make her talk. pt writing answers built rapport and pt states "thank you really thank you. you have a good day"           ADL either performed or assessed with clinical judgement   ADL Overall ADL's : Needs assistance/impaired       Grooming Details (indicate cue type and reason): reports grooming this morning and sister verified. pt declines sink level                 Toilet Transfer: Min guard           Functional mobility during ADLs: Min guard General ADL Comments: pt holding environmental supports at time on the wall ( wall rail) and navigated  out of the unit. pt seems shocked when told she had brain injury " when?!!" pt cued to staples on head and pt states "oh oh yeah" Pt repeatly states "i am private person i live alone. i dont talk much. i dont get out much" pt telling sister you can go back to [place] when i go home i live alone. sister telling patient she plans to stay 1 month and pt shaking her head in disapproval. pt lacks awareness to deficits     Vision       Perception     Praxis      Cognition Arousal/Alertness: Awake/alert Behavior During Therapy: Flat affect Overall Cognitive Status: Impaired/Different from baseline Area of Impairment: Memory;Following commands;Awareness                     Memory: Decreased recall of  precautions;Decreased short-term memory Following Commands: Follows one step commands inconsistently;Follows one step commands with increased time Safety/Judgement: Decreased awareness of safety;Decreased awareness of deficits Awareness: Emergent Problem Solving: Slow processing General Comments: pt with expressive deficits but when present with question 2-3 choices able to correctly answer. Pt knows today is Friday, March but when given two sentences to read unable to follow the sentence. Sister asking menu choices for lunch and pt unable to respond other than "you know" Pt with it written down chicken salad or tuna salad Pt states "neither really" but points to chicken. When writing "chips" pt states "yes!" smiling and excited for chips        Exercises     Shoulder Instructions       General Comments VSS    Pertinent Vitals/ Pain       Pain Assessment: No/denies pain  Home Living                                          Prior Functioning/Environment              Frequency  Min 2X/week        Progress Toward Goals  OT Goals(current goals can now be found in the care plan section)  Progress towards OT goals: Progressing toward goals  Acute Rehab OT Goals Patient Stated Goal: to go home shower and wear her own clothes. OT Goal Formulation: With patient Time For Goal Achievement: 10/27/20 Potential to Achieve Goals: Good ADL Goals Additional ADL Goal #1: pt will complete pill box test 100% accuracy Additional ADL Goal #2: pt will complete pathfinding task with 1 or less errors Additional ADL Goal #3: pt will complete full adl at sink level min guard (A)  Plan Discharge plan remains appropriate    Co-evaluation                 AM-PAC OT "6 Clicks" Daily Activity     Outcome Measure   Help from another person eating meals?: A Little Help from another person taking care of personal grooming?: A Little Help from another person toileting,  which includes using toliet, bedpan, or urinal?: A Little Help from another person bathing (including washing, rinsing, drying)?: A Little Help from another person to put on and taking off regular upper body clothing?: A Little Help from another person to put on and taking off regular lower body clothing?: A Little 6 Click Score: 18    End of Session    OT Visit Diagnosis: Unsteadiness  on feet (R26.81)   Activity Tolerance Patient tolerated treatment well   Patient Left in bed;with call bell/phone within reach;with family/visitor present;Other (comment) Civil engineer, contracting for different unit in room rounding)   Nurse Communication Mobility status;Precautions        Time: 1117-3567 OT Time Calculation (min): 34 min  Charges: OT General Charges $OT Visit: 1 Visit OT Treatments $Self Care/Home Management : 23-37 mins   Brynn, OTR/L  Acute Rehabilitation Services Pager: [phone] Office: [phone] .    Jeri Modena 10/19/2020, 1:36 PM

## 2020-10-19 NOTE — Progress Notes (Signed)
Inpatient Diabetes Program Recommendations  AACE/ADA: New Consensus Statement on Inpatient Glycemic Control (2015)  Target Ranges:  Prepandial:   less than 140 mg/dL      Peak postprandial:   less than 180 mg/dL (1-2 hours)      Critically ill patients:  140 - 180 mg/dL   Lab Results  Component Value Date   GLUCAP 308 (H) 10/19/2020   HGBA1C 13.6 (H) 10/10/2020    Review of Glycemic Control Results for Regina Mckay, Regina Mckay (MRN [id number]) as of 10/19/2020 15:21  Ref. Range 10/18/2020 07:15 10/18/2020 11:18 10/18/2020 16:30 10/18/2020 21:14 10/19/2020 07:35 10/19/2020 11:24  Glucose-Capillary Latest Ref Range: 70 - 99 mg/dL 157 (H) 190 (H) 214 (H) 270 (H) 206 (H) 308 (H)   Diabetes history: DM 2 (was seeing Dr. Maudie Mercury, will make new appt with PCP in that office) Outpatient Diabetes medications: Glucovance 5-500 mg tid, Basaglar 50 units qhs Current orders for Inpatient glycemic control:  Lantus 70 units Novolog 0-20 units tid + hs Novolog 13 units tid meal coverage  Inpatient Diabetes Program Recommendations:    Decadron decreased again to 4 mg Q12 hours (adjusted 3/18) trends should be better tomorrow.  -  Increase Novolog meal coverage to 16 units tid.  Spoke with pt in detail on 3/9. Pt was not on medication for awhile. She was not able to get refills due to the amount of time it had been since seeing a doctor.  D/c: Blood glucose meter kit order # [id number] Refill on Glucovance Refill on Amlodipine  Will follow pt.   Thanks,  Tama Headings RN, MSN, BC-ADM Inpatient Diabetes Coordinator Team Pager [phone] (8a-5p)

## 2020-10-20 LAB — GLUCOSE, CAPILLARY: Glucose-Capillary: 189 mg/dL — ABNORMAL HIGH (ref 70–99)

## 2020-10-20 MED ORDER — HYDROCODONE-ACETAMINOPHEN 5-325 MG PO TABS: 1.0000 | ORAL_TABLET | Freq: Four times a day (QID) | ORAL | 0 refills | Status: DC | PRN

## 2020-10-20 MED ORDER — DEXAMETHASONE 4 MG PO TABS: ORAL_TABLET | ORAL | 0 refills | Status: DC

## 2020-10-20 MED ORDER — POLYETHYLENE GLYCOL 3350 17 G PO PACK: 17.0000 g | PACK | Freq: Every day | ORAL | 0 refills | Status: DC | PRN

## 2020-10-20 MED ORDER — HYDROCHLOROTHIAZIDE 12.5 MG PO CAPS: 12.5000 mg | ORAL_CAPSULE | Freq: Every day | ORAL | 0 refills | Status: DC

## 2020-10-20 MED ORDER — BASAGLAR KWIKPEN 100 UNIT/ML ~~LOC~~ SOPN: 70.0000 [IU] | PEN_INJECTOR | Freq: Every day | SUBCUTANEOUS | Status: DC

## 2020-10-20 MED ORDER — LEVETIRACETAM 500 MG PO TABS: 500.0000 mg | ORAL_TABLET | Freq: Two times a day (BID) | ORAL | 1 refills | Status: DC

## 2020-10-20 NOTE — TOC Transition Note (Signed)
Transition of Care [facility]) - CM/SW Discharge Note   Patient Details  Name: Regina Mckay MRN: [id number] Date of Birth: [birth year]  Transition of Care [facility]) CM/SW Contact:  Ella Bodo, RN Phone Number: 10/20/2020, 10:01 AM   Clinical Narrative: Patient medically stable for discharge home today with sister to provide 24-hour care.  Family has requested home health services instead of outpatient, as they fear patient may be difficult to get to appointments.  Referral to [facility] for PT/OT/ST follow-up.  Tub bench has been delivered to room.     Final next level of care: Home w Home Health Services Barriers to Discharge: Barriers Resolved   Patient Goals and CMS Choice   CMS Medicare.gov Compare Post Acute Care list provided to:: Patient Represenative (must comment) (sister) Choice offered to / list presented to : Sibling                        Discharge Plan and Services   Discharge Planning Services: CM Consult Post Acute Care Choice: Home Health          DME Arranged: Tub bench DME Agency: [employer] Date DME Agency Contacted: 10/19/20 Time DME Agency Contacted: [id number] Representative spoke with at DME Agency: Sheila Quintana: [place]: [place] Date [place]: 10/20/20 Time [place]: [id number] Representative spoke with at [place]: [place] ([place]) Interventions     Readmission Risk Interventions No flowsheet data found.  Reinaldo Raddle, RN, BSN  Trauma/Neuro ICU Case Manager [phone]

## 2020-10-20 NOTE — Discharge Summary (Signed)
Physician Discharge Summary  Regina Mckay NIO:[id number] DOB: [birth year] DOA: 10/09/2020  PCP: Jani Gravel, MD  Admit date: 10/09/2020 Discharge date: 10/20/2020  Admitted From: home Disposition:  home  Recommendations for Outpatient Follow-up:  1. Follow up with PCP in 1-2 weeks 2. Please obtain BMP/CBC in one week 3. Follow up with neurosurgery, Dr. Marcello Moores next week for staple removal 4. Follow up with PCP to further address cerumen impaction right ear 5. Consider outpatient hematology referral for elevated erythropoietin level  Home Health:HH PT, OT, SLP Equipment/Devices:  Discharge Condition:stable CODE STATUS:full code Diet recommendation: heart healthy  Brief/Interim Summary: Regina Ige Wrightis avery pleasant right-handed 70 y.o.femalewith medical history significant fortype 2 diabetes mellitus, hypertension, and BMI 41, now presenting to the emergency department for evaluation of right hand weakness and speech disturbance. Imaging notable for dural based lesion of the left middle cranial fossal most c/w meningioma.  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  She's now s/p craniotomy for resection of meningioma.  Hospitalization complicated by increased difficulties with speech with word finding issues and CT findings showing hematoma.    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  - continue keppra 500 mg BID  -  S/p craniotomy for resection of meningioma, application of cranial navigation  3/11 - MRI 3/11 follow up with increased dural thickness and contrast enhancement in the anterior left middle cranial fossa extending along the sphenoid wing and tentorial incisura - postsurgical changes - neurosurgery c/s, appreciate recs - steroid taper - word finding difficulties noted on 3/14 (per nsgy note, nurse and OT noted 3/13) -> CT head 3/14 with hematoma in tumor bed inferior to he L temporal lobe, low density in the L temporal bone which could represent edema or infarction.  Small subdural hematoma along the L tentorium.  (see report) - CT head 3/15 with stable post op blood products and edema - EEG without seizure or epileptiform discharges -Suspect that her aphasia is related to edema noted on CT head -currently on steroid taper -will likely take a few weeks for aphasia/edema to improve -discussed with Neurosurgery and it was felt reasonable to discharge patient home today with outpatient follow up with neurosurgery next week -she has been set up with [facility] SLP   2.Hypertensive urgency -BP as high as 250/110 in ED in setting of anxiety and running out of her antihypertensives - BP goal < 818 systolic - currently on amlodipine, lisinopril and HCTZ - -BPs have been reasonable -continue outpatient follow up  3.Uncontrolled type II DM - A1c 13.6 -lantus, basal, bolus, SSI - adjust prn (worsened hyperglycemia with steroids, follow with taper).   -resume oral meds on discharge -continue close monitoring of blood sugars are steroids are tapered  # Elevated Bilirubin Improving, continue to monitor - notably elevated in 2020 as well Follow outpatient  4.Polycythemia -Hgb 17.9 on admission- now improved post op -Patient not hypovolemic, denies smoking or lung disease, denies chest pain, and no CVA on MRI -Check serum epo-- elevated - recommending heme follow up outpatient - workup further outpatient  5. Right ear  Cerumen impaction  -complained of muffled  hearing from right ear -noted to have cerumen build up in right ear canal -visible TM appeared normal -started on debrox   Discharge Instructions  Discharge Instructions    Ambulatory referral to Neurology   Complete by: As directed    Please schedule patient 3 weeks from discharge   Ambulatory referral to Occupational Therapy   Complete by: As directed    Ambulatory referral to Physical Therapy   Complete by: As directed    Ambulatory referral to Speech Therapy   Complete by: As directed    Diet - low sodium heart healthy   Complete by: As directed    Discharge wound care:   Complete by: As directed    Clean incision site with baby shampoo, keep clean and dry. Avoid wearing hats   Increase activity slowly   Complete by: As directed      Allergies as of 10/20/2020      Reactions   Amoxicillin Shortness Of Breath   Penicillins Shortness Of Breath   Did it involve swelling of the face/tongue/throat, SOB, or low BP? Yes Did it involve sudden or severe rash/hives, skin peeling, or any reaction on the inside of your mouth or nose? No Did you need to seek medical attention at a hospital or doctor's office? Yes When did it last happen? If all above answers are "NO", may proceed with cephalosporin use.      Medication List    TAKE these medications   acetaminophen 500 MG tablet Commonly known as: TYLENOL Take 500 mg by mouth every 6 (six) hours as needed.   amLODipine 10 MG tablet Commonly known as: NORVASC Take 10 mg by mouth daily.   Basaglar KwikPen 100 UNIT/ML Inject 70 Units into the skin daily. What changed:   how much to take  when to take this   dexamethasone 4 MG tablet Commonly known as: DECADRON Take 4mg  po bid for 4 days then 2mg  bid for 4 days then 2mg  daily for 4 days then stop   glyBURIDE-metformin 5-500 MG tablet Commonly known as: GLUCOVANCE Take 1 tablet by mouth 3 (three) times daily.   hydrochlorothiazide 12.5 MG capsule Commonly known  as: MICROZIDE Take 1 capsule (12.5 mg total) by mouth daily. Start taking on: October 21, 2020   HYDROcodone-acetaminophen 5-325 MG tablet Commonly known as: NORCO/VICODIN Take 1-2 tablets by mouth every 6 (six) hours as needed for severe pain.   levETIRAcetam 500 MG tablet Commonly known as: KEPPRA Take 1 tablet (500 mg total) by mouth 2 (two) times daily.   lisinopril 20 MG tablet Commonly known as: ZESTRIL Take 20 mg by mouth daily.   polyethylene glycol 17 g packet Commonly known as: MIRALAX / GLYCOLAX Take 17 g by mouth daily as needed for mild constipation.   pravastatin 40 MG tablet Commonly known as: PRAVACHOL Take 40 mg by mouth daily.   vitamin B-12 1000 MCG tablet Commonly known as: CYANOCOBALAMIN Take 1,000 mcg by mouth daily.   vitamin C 500 MG tablet Commonly known as: ASCORBIC ACID Take 500 mg by mouth daily.   ZINC PO Take 1 tablet by mouth daily.            Durable Medical Equipment  (From admission, onward)         Start     Ordered   10/19/20 1408  For home use only DME Tub bench  Once        10/19/20 1408  Discharge Care Instructions  (From admission, onward)         Start     Ordered   10/20/20 0000  Discharge wound care:       Comments: Clean incision site with baby shampoo, keep clean and dry. Avoid wearing hats   10/20/20 0944          Follow-up Information    [name], MD Follow up.   Specialty: Neurosurgery Why: call for appointment next week to remove staples Contact information: [place] [address] [phone]        [facility] Follow up.   Specialty: Home Health Services Why: Home health physical, occupational and speech therapy.  Agency will call you within 24-48h to set up appointments.  Contact information: [place] [address] [phone]              Allergies  Allergen Reactions  . Amoxicillin Shortness  Of Breath  . Penicillins Shortness Of Breath    Did it involve swelling of the face/tongue/throat, SOB, or low BP? Yes Did it involve sudden or severe rash/hives, skin peeling, or any reaction on the inside of your mouth or nose? No Did you need to seek medical attention at a hospital or doctor's office? Yes When did it last happen? If all above answers are "NO", may proceed with cephalosporin use.     Consultations:  Neurology  Neurosurgery   Procedures/Studies: CT HEAD WO CONTRAST  Result Date: 10/16/2020 CLINICAL DATA:  Postop tumor resection EXAM: CT HEAD WITHOUT CONTRAST TECHNIQUE: Contiguous axial images were obtained from the base of the skull through the vertex without intravenous contrast. COMPARISON:  Yesterday FINDINGS: Brain: Left pterional craniotomy with left temporal lobe hemorrhage and edema is non progressed. The parenchymal hemorrhagic component continues to measure up to 3 cm. Small volume subdural gas and hemorrhage along the left convexity is also non progressed. No complicating infarct, entrapment, or progressive mass effect. Vascular: Negative Skull: Unremarkable left craniotomy. Sinuses/Orbits: Bilateral cataract resection IMPRESSION: Stable postoperative blood products and edema. Electronically Signed   By: Monte Fantasia M.D.   On: 10/16/2020 06:34   CT HEAD WO CONTRAST  Addendum Date: 10/15/2020   ADDENDUM REPORT: 10/15/2020 15:28 ADDENDUM: These results were called by telephone at the time of interpretation on 10/15/2020 at 3:27 pm to provider Trenton Gammon , who verbally acknowledged these results. Electronically Signed   By: Franchot Gallo M.D.   On: 10/15/2020 15:28   Result Date: 10/15/2020 CLINICAL DATA:  Postop tumor resection EXAM: CT HEAD WITHOUT CONTRAST TECHNIQUE: Contiguous axial images were obtained from the base of the skull through the vertex without intravenous contrast. COMPARISON:  MRI head 10/12/2020.  CT head 10/09/2020 FINDINGS: Brain: Postop  left temporal craniotomy for tumor resection. There is high-density hemorrhage in the tumor bed. Hematoma measures approximately 3.5 x 1.9 x 3.0 cm is located under the left temporal lobe in the tumor bed. There is edema in the adjacent left temporal lobe which could be infarction. Small amount of subdural blood and gas is present on the left underlying the craniotomy flap. There is a small amount of subdural hemorrhage along the left tentorium. Mild mass-effect on the left lateral ventricle has developed since prior studies. No hydrocephalus or midline shift. Vascular: Negative for hyperdense vessel Skull: Left craniotomy with replacement of the craniotomy flap in good position. Sinuses/Orbits: Paranasal sinuses clear. Left mastoid effusion is new since the prior CT.  Other: None IMPRESSION: Postop left temporal craniotomy for tumor resection. There is a hematoma in the tumor bed inferior to the left temporal lobe. There is low-density in the left temporal bone which could represent edema or infarction. Small subdural hematoma along the left tentorium. Left mastoid effusion, new since the recent study. Craniotomy appears to extend through the anterior portion of the mastoid sinus on the left. Electronically Signed: By: Franchot Gallo M.D. On: 10/15/2020 15:14   CT HEAD WO CONTRAST  Result Date: 10/09/2020 CLINICAL DATA:  Three day history of right-sided weakness. History of hypertension. EXAM: CT HEAD WITHOUT CONTRAST TECHNIQUE: Contiguous axial images were obtained from the base of the skull through the vertex without intravenous contrast. COMPARISON:  Prior study from 2006. FINDINGS: Brain: The ventricles are in the midline without mass effect or shift. They are normal in size and configuration for the patient's age and degree of cerebral atrophy. No extra-axial fluid collections are identified. The gray-white differentiation is maintained. There is an abnormal appearance of the left temporal lobe. The sulci  are compressed and the left temporal horn is also compressed. Vague low-attenuation noted in the central portion of the temporal lobe. This could be a subacute infarct. Neoplasm would be another possibility. In the right clinical situation herpes would be a consideration also. Recommend MRI brain without and with contrast for further evaluation. Vascular: No hyperdense vessels or obvious aneurysm. Moderate atherosclerotic calcifications. Skull: No skull lesions or fractures. Mild hyperostosis frontalis interna. Sinuses/Orbits: The paranasal sinuses and mastoid air cells are clear. The globes are intact. Other: No scalp lesions or scalp hematoma. IMPRESSION: 1. Abnormal appearance of the left temporal lobe. This could be a subacute infarct. Neoplasm would be another possibility. In the right clinical situation herpes would be a consideration also. Recommend MRI brain without and with contrast for further evaluation. 2. No other significant findings. Electronically Signed   By: Marijo Sanes M.D.   On: 10/09/2020 15:10   MR BRAIN W WO CONTRAST  Result Date: 10/12/2020 CLINICAL DATA:  Brain mass or lesion. EXAM: MRI HEAD WITHOUT AND WITH CONTRAST TECHNIQUE: Multiplanar, multiecho pulse sequences of the brain and surrounding structures were obtained without and with intravenous contrast. CONTRAST:  24mL GADAVIST GADOBUTROL 1 MMOL/ML IV SOLN COMPARISON:  MRI of the brain October 10, 2020 FINDINGS: The study is degraded by motion. Brain: Subjacent to the area of recent craniotomy, there is increased dural thickness and contrast enhancement in the anterior left middle cranial fossa extending along the sphenoid wing and tentorial incisura. Findings reflect postsurgical changes although residual tumor cannot be entirely excluded. Continued follow-up suggested. The left temporal lobe appear preserved. No acute infarction, hemorrhage or hydrocephalus. Vascular: Normal flow voids. Skull and upper cervical spine: Postsurgical  changes from left frontotemporal craniotomy. Marrow signal characteristics are otherwise maintained. Sinuses/Orbits: Bilateral lens surgery. Paranasal sinuses are clear. Other: Left mastoid effusion. IMPRESSION: 1. Increased dural thickness and contrast enhancement in the anterior left middle cranial fossa extending along the sphenoid wing and tentorial incisura. Findings reflect postsurgical changes although residual tumor cannot be entirely excluded. Continued follow-up suggested. 2. Left mastoid effusion. Electronically Signed   By: Pedro Earls M.D.   On: 10/12/2020 11:36   MR BRAIN W WO CONTRAST  Result Date: 10/10/2020 CLINICAL DATA:  Preop tumor evaluation. EXAM: MRI HEAD WITHOUT AND WITH CONTRAST TECHNIQUE: Multiplanar, multiecho pulse sequences of the brain and surrounding structures were obtained without and with intravenous contrast. CONTRAST:  63mL GADAVIST GADOBUTROL 1  MMOL/ML IV SOLN COMPARISON:  MRI head 10/09/2020 FINDINGS: Brain: BrainLAB protocol for surgical planning purposes. Enhancing mass lesion in the left middle cranial fossa is extra-axial and dural based. The mass measures 29 x 41 mm on axial postcontrast images. This is underneath the left temporal lobe and extends to the left cavernous sinus. Mild susceptibility due to mineralization. No significant edema. No change from yesterday. There is mild thickening enhancement of the tentorium adjacent to the tumor. Ventricle size normal. No midline shift. Scattered small white matter hyperintensities, mild. No acute infarct. Vascular: Normal arterial flow voids. Skull and upper cervical spine: No skeletal lesion. Sinuses/Orbits: Paranasal sinuses clear. Bilateral cataract extraction. Other: None IMPRESSION: Meningioma in the left middle cranial fossa extending to the tentorium and cavernous sinus. This is under the left temporal lobe. No significant brain edema. Electronically Signed   By: Franchot Gallo M.D.   On: 10/10/2020  18:11   MR Brain W and Wo Contrast  Result Date: 10/09/2020 CLINICAL DATA:  Abnormal CT, hypertensive with right-sided weakness EXAM: MRI HEAD WITHOUT AND WITH CONTRAST TECHNIQUE: Multiplanar, multiecho pulse sequences of the brain and surrounding structures were obtained without and with intravenous contrast. CONTRAST:  10.42mL GADAVIST GADOBUTROL 1 MMOL/ML IV SOLN COMPARISON:  Correlation made with same day head CT FINDINGS: Brain: Relatively homogeneously enhancing dural-based lesion is present within the left middle cranial fossa. Measures approximately 3.6 x 3.8 x 2.1 cm. There is some associated susceptibility medially likely reflecting mineralization. Dural enhancement is present along the adjacent tentorial incisura. No evidence of cavernous sinus or orbital apex involvement. Mild mass effect on the left temporal lobe but no apparent parenchymal edema. There is no acute infarction. No additional mass or abnormal enhancement. Ventricles and sulci are normal in size and configuration. Minimal patchy foci of T2 hyperintensity in the supratentorial white matter are nonspecific but may reflect minor chronic microvascular ischemic changes. Vascular: Major vessel flow voids at the skull base are preserved. Skull and upper cervical spine: Normal marrow signal is preserved. Sinuses/Orbits: Paranasal sinuses are aerated. Orbits are unremarkable. Other: Sella is unremarkable.  Mastoid air cells are clear. IMPRESSION: Dural-based lesion of the left middle cranial fossa most consistent with a meningioma. Mild displacement of the adjacent temporal lobe without parenchymal edema. Electronically Signed   By: Macy Mis M.D.   On: 10/09/2020 20:30   DG CHEST PORT 1 VIEW  Result Date: 10/10/2020 CLINICAL DATA:  Preop. EXAM: PORTABLE CHEST 1 VIEW COMPARISON:  None. FINDINGS: The cardiomediastinal contours are normal. Linear subsegmental atelectasis at the left lung base. Pulmonary vasculature is normal. No  consolidation, pleural effusion, or pneumothorax. No acute osseous abnormalities are seen. IMPRESSION: Linear subsegmental atelectasis at the left lung base. Electronically Signed   By: Keith Rake M.D.   On: 10/10/2020 21:17   EEG adult  Result Date: 10/10/2020 Lora Havens, MD     10/10/2020 11:54 AM Patient Name: ALYNNA HARGROVE MRN: [id number] Epilepsy Attending: Lora Havens Referring Physician/Provider: Dr. Mitzi Hansen Date: 10/10/2020 Duration: 22.40 mins Patient history: 70 year old female with a PMHx of DM and HTN who presents to the ED with recurrent spells of expressive dysphasia over the past 6 days. EEG to evaluate for seizures Level of alertness: Awake, asleep AEDs during EEG study: None Technical aspects: This EEG study was done with scalp electrodes positioned according to the 10-20 International system of electrode placement. Electrical activity was acquired at a sampling rate of 500Hz  and reviewed with a high frequency  filter of 70Hz  and a low frequency filter of 1Hz . EEG data were recorded continuously and digitally stored. Description: The posterior dominant rhythm consists of 9-10 Hz activity of moderate voltage (25-35 uV) seen predominantly in posterior head regions, symmetric and reactive to eye opening and eye closing. Sleep was characterized by vertex waves, sleep spindles (12 to 14 Hz), maximal frontocentral region. EEG showed intermittent  3 to 6 Hz theta-delta slowing in left temporal region. Hyperventilation did not show any EEG change. Physiologic photic driving was not seen during photic stimulation. ABNORMALITY -Intermittent slow, left temporal region IMPRESSION: This study is suggestive of nonspecific cortical dysfunction in left temporal region.  No seizures or epileptiform discharges were seen throughout the recording. [name]       Subjective: Speech is not completely clear, but overall seems to be improving  Discharge Exam: Vitals:   10/19/20 2004  10/19/20 2357 10/20/20 0458 10/20/20 0755  BP: (!) 124/55  (!) 111/43 (!) 141/66  Pulse: 78 79 61 63  Resp: 16 18 16 18   Temp: 98.3 F (36.8 C) 98.5 F (36.9 C) 98.3 F (36.8 C) 98.3 F (36.8 C)  TempSrc: Oral Oral Oral Oral  SpO2: 98% 97% 97% 94%  Weight:      Height:        General: Pt is alert, awake, not in acute distress Cardiovascular: RRR, S1/S2 +, no rubs, no gallops Respiratory: CTA bilaterally, no wheezing, no rhonchi Abdominal: Soft, NT, ND, bowel sounds + Extremities: no edema, no cyanosis    The results of significant diagnostics from this hospitalization (including imaging, microbiology, ancillary and laboratory) are listed below for reference.     Microbiology: No results found for this or any previous visit (from the past 240 hour(s)).   Labs: BNP (last 3 results) No results for input(s): BNP in the last 8760 hours. Basic Metabolic Panel: Recent Labs  Lab 10/14/20 0410 10/15/20 0152 10/16/20 0102 10/17/20 0046  NA 136 136 133* 133*  K 4.2 3.9 4.4 3.8  CL 103 101 98 97*  CO2 25 28 27 27   GLUCOSE 292* 224* 218* 292*  BUN 29* 21 22 26*  CREATININE 0.81 0.82 0.62 0.81  CALCIUM 8.4* 8.4* 8.3* 8.1*  MG 2.0 2.0 2.0 1.9  PHOS 2.6 2.5 2.8 2.7   Liver Function Tests: Recent Labs  Lab 10/14/20 0410 10/15/20 0152 10/16/20 0102 10/17/20 0046  AST 15 15 22 15   ALT 14 12 13 15   ALKPHOS 51 47 47 45  BILITOT 2.5* 1.8* 1.8* 1.3*  PROT 6.2* 5.8* 5.8* 5.6*  ALBUMIN 3.1* 2.9* 2.8* 2.7*   No results for input(s): LIPASE, AMYLASE in the last 168 hours. No results for input(s): AMMONIA in the last 168 hours. CBC: Recent Labs  Lab 10/14/20 0410 10/15/20 0152 10/16/20 0102 10/17/20 0046  WBC 13.2* 10.6* 6.8 9.7  NEUTROABS 12.4* 9.1* 5.8 7.6  HGB 13.6 13.8 14.3 13.9  HCT 40.2 41.9 42.8 39.7  MCV 86.5 86.6 86.1 83.6  PLT 213 206 204 217   Cardiac Enzymes: No results for input(s): CKTOTAL, CKMB, CKMBINDEX, TROPONINI in the last 168  hours. BNP: Invalid input(s): POCBNP CBG: Recent Labs  Lab 10/19/20 0735 10/19/20 1124 10/19/20 1639 10/19/20 2048 10/20/20 0811  GLUCAP 206* 308* 310* 160* 189*   D-Dimer No results for input(s): DDIMER in the last 72 hours. Hgb A1c No results for input(s): HGBA1C in the last 72 hours. Lipid Profile No results for input(s): CHOL, HDL, LDLCALC, TRIG, CHOLHDL, LDLDIRECT  in the last 72 hours. Thyroid function studies No results for input(s): TSH, T4TOTAL, T3FREE, THYROIDAB in the last 72 hours.  Invalid input(s): FREET3 Anemia work up No results for input(s): VITAMINB12, FOLATE, FERRITIN, TIBC, IRON, RETICCTPCT in the last 72 hours. Urinalysis    Component Value Date/Time   COLORURINE YELLOW 05/17/2019 0555   APPEARANCEUR HAZY (A) 05/17/2019 0555   LABSPEC 1.022 05/17/2019 0555   PHURINE 5.0 05/17/2019 0555   GLUCOSEU >=500 (A) 05/17/2019 0555   HGBUR NEGATIVE 05/17/2019 0555   BILIRUBINUR NEGATIVE 05/17/2019 0555   KETONESUR 20 (A) 05/17/2019 0555   PROTEINUR >=300 (A) 05/17/2019 0555   UROBILINOGEN 1.0 10/18/2009 1052   NITRITE NEGATIVE 05/17/2019 0555   LEUKOCYTESUR NEGATIVE 05/17/2019 0555   Sepsis Labs Invalid input(s): PROCALCITONIN,  WBC,  LACTICIDVEN Microbiology No results found for this or any previous visit (from the past 240 hour(s)).   Time coordinating discharge: 65mins  SIGNED:   Kathie Dike, MD  [facility] 10/20/2020, 5:19 PM   If 7PM-7AM, please contact night-coverage [email or web address]

## 2020-11-19 ENCOUNTER — Telehealth: Payer: Self-pay | Admitting: Orthopaedic Surgery

## 2020-11-19 NOTE — Telephone Encounter (Signed)
I don't mind doing this again given the patient's significant medical issues.  We have seen her for many years.

## 2020-11-19 NOTE — Telephone Encounter (Signed)
Form ready for pick up at front desk.

## 2020-11-19 NOTE — Telephone Encounter (Signed)
Please advise We haven't seen this patient in many years

## 2020-11-19 NOTE — Telephone Encounter (Signed)
Tried calling pt to inform but no answer and mailbox was full

## 2020-11-19 NOTE — Telephone Encounter (Signed)
Pt called stating her handicap placard expires in June and she would like to know if she needs to come into the office to be evaluated to get that extended? Or if she could have a letter written/ or drop off an application?  [phone]

## 2020-12-22 ENCOUNTER — Emergency Department (HOSPITAL_COMMUNITY)
Admission: EM | Admit: 2020-12-22 | Discharge: 2020-12-22 | Disposition: A | Discharge status: Home / Self Care | Payer: Federal, State, Local not specified - PPO | Attending: Emergency Medicine | Admitting: Emergency Medicine

## 2020-12-22 ENCOUNTER — Encounter (HOSPITAL_COMMUNITY): Payer: Self-pay

## 2020-12-22 ENCOUNTER — Other Ambulatory Visit: Payer: Self-pay

## 2020-12-22 DIAGNOSIS — Z794 Long term (current) use of insulin: Secondary | ICD-10-CM | POA: Diagnosis not present

## 2020-12-22 DIAGNOSIS — E1165 Type 2 diabetes mellitus with hyperglycemia: Secondary | ICD-10-CM | POA: Diagnosis not present

## 2020-12-22 DIAGNOSIS — R739 Hyperglycemia, unspecified: Secondary | ICD-10-CM

## 2020-12-22 DIAGNOSIS — I10 Essential (primary) hypertension: Secondary | ICD-10-CM | POA: Insufficient documentation

## 2020-12-22 DIAGNOSIS — Z79899 Other long term (current) drug therapy: Secondary | ICD-10-CM | POA: Diagnosis not present

## 2020-12-22 LAB — URINALYSIS, ROUTINE W REFLEX MICROSCOPIC
Bacteria, UA: NONE SEEN
Bilirubin Urine: NEGATIVE
Glucose, UA: >=500 mg/dL — AB
Hgb urine dipstick: NEGATIVE
Ketones, ur: NEGATIVE mg/dL
Leukocytes,Ua: NEGATIVE
Nitrite: NEGATIVE
Protein, ur: NEGATIVE mg/dL
Specific Gravity, Urine: 1.024 (ref 1.005–1.030)
pH: 5 (ref 5.0–8.0)

## 2020-12-22 LAB — CBC
HCT: 45.2 % (ref 36.0–46.0)
Hemoglobin: 15.2 g/dL — ABNORMAL HIGH (ref 12.0–15.0)
MCH: 28.6 pg (ref 26.0–34.0)
MCHC: 33.6 g/dL (ref 30.0–36.0)
MCV: 85.1 fL (ref 80.0–100.0)
Platelets: 206 10*3/uL (ref 150–400)
RBC: 5.31 MIL/uL — ABNORMAL HIGH (ref 3.87–5.11)
RDW: 11.7 % (ref 11.5–15.5)
WBC: 7.2 10*3/uL (ref 4.0–10.5)
nRBC: 0 % (ref 0.0–0.2)

## 2020-12-22 LAB — BASIC METABOLIC PANEL
Anion gap: 10 (ref 5–15)
BUN: 22 mg/dL (ref 8–23)
CO2: 28 mmol/L (ref 22–32)
Calcium: 9.4 mg/dL (ref 8.9–10.3)
Chloride: 94 mmol/L — ABNORMAL LOW (ref 98–111)
Creatinine, Ser: 0.97 mg/dL (ref 0.44–1.00)
GFR, Estimated: >60 mL/min (ref >60)
Glucose, Bld: 449 mg/dL — ABNORMAL HIGH (ref 70–99)
Potassium: 4.3 mmol/L (ref 3.5–5.1)
Sodium: 132 mmol/L — ABNORMAL LOW (ref 135–145)

## 2020-12-22 LAB — CBG MONITORING, ED: Glucose-Capillary: 436 mg/dL — ABNORMAL HIGH (ref 70–99)

## 2020-12-22 MED ORDER — INSULIN ASPART PROT & ASPART (70-30 MIX) 100 UNIT/ML ~~LOC~~ SUSP
45.0000 [IU] | Freq: Every day | SUBCUTANEOUS | Status: DC
  Administered 2020-12-22: 45 [IU] via SUBCUTANEOUS
  Filled 2020-12-22: qty 10

## 2020-12-22 MED ORDER — INSULIN ASPART PROT & ASPART (70-30 MIX) 100 UNIT/ML ~~LOC~~ SUSP: 40.0000 [IU] | Freq: Every day | SUBCUTANEOUS | Status: DC

## 2020-12-22 NOTE — ED Triage Notes (Signed)
Patient reports high blood sugar x 2 tonight, states she has been taking her medication as prescribed, denies any other complaints

## 2020-12-22 NOTE — ED Provider Notes (Signed)
  Emergency Medicine Provider in Triage Note   MSE was initiated and I personally evaluated the patient  1:47 AM on Dec 22, 2020 as provider in triage.   Chief Complaint: Hyperglycemia  HPI  Patient is a 70 y.o. who presents to the ED with complaints of hyperglycemia. Checked blood sugar tonight- ran high with two readings concerning her and family prompting ED visit. No particular new sxs, urinates frequently at baseline. Has been taking all diabetes medications as prescribed including her insulin.    Review of Systems  Positive: frequency Negative: Chest pain, dyspnea, abdominal pain, vomiting, polydispsia  Physical Exam  BP (!) 157/74 (BP Location: Right Arm)   Pulse 99   Temp 98.6 F (37 C) (Oral)   Resp 18   SpO2 99%    Gen:   Awake, no distress   HEENT:  Atraumatic  Resp:  Normal effort  Cardiac:  Normal rate  Abd:   Nondistended, nontender  MSK:   Moves extremities without difficulty  Neuro:  Speech clear   Medical Decision Making   Initiation of care has begun. The patient has been counseled on the process, plan, and necessity for staying for the completion/evaluation, informed that the remainder of the evaluation will be completed by another provider, this initial triage assessment does not replace that evaluation, and the importance of remaining in the ED until their evaluation is complete.  CBG:  436, basic labs ordered  Clinical Impression  Hyperglycemia.         Amaryllis Dyke, PA-C 12/22/20 0152    Maudie Flakes, MD 12/22/20 [id number]

## 2020-12-22 NOTE — ED Provider Notes (Signed)
[facility] EMERGENCY DEPARTMENT Provider Note   CSN: [id number] Arrival date & time: 12/22/20  0102     History Chief Complaint  Patient presents with  . Hyperglycemia    Regina Mckay is a 70 y.o. 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  She felt normal this week.  Reports last night her home check had blood sugar "too higH" to read.  She called triage in the ED who advised she come in for evaluation, and arrived around 0400.  She states her BS has come down since then. She denies recent fevers, chills, cough, abdominal pain, diarrhea, dysuria, hematuria.    Her daily log of BS shows an average sugar between 300-400.   She drinks lots of water at home and also diet soda.  She denies excessive thirst or urinary frequency.  Reports urine is usually clear.  She takes aspart/protamine 70/30 insulin mix three times per day, 40 units before breakfast, 30 units before dinner, and 15 units at bedtime if her BS is over 250.  She has a new PCP as of March 2022 who is trying to titrate her insulin currently.  She reports compliance with her insulin every day, and all other medications.  She otherwise takes BP meds, and a statin.  She is not on prednisone or steroids.  HPI     Past Medical History:  Diagnosis Date  . Diabetes mellitus without complication ([place])   . Hypertension     Patient Active Problem List   Diagnosis Date Noted  . Speech abnormality 10/10/2020  . Episode of transient neurologic symptoms 10/09/2020  . Uncontrolled diabetes mellitus with hyperglycemia ([place]) 10/09/2020  . Polycythemia 10/09/2020  . Hypertensive urgency 10/09/2020  . Meningioma [facility])     Past Surgical History:  Procedure Laterality Date  . APPLICATION OF CRANIAL NAVIGATION N/A 10/11/2020   Procedure: APPLICATION OF CRANIAL NAVIGATION;  Surgeon: [name], MD;  Location: [facility];  Service: Neurosurgery;   Laterality: N/A;  . CRANIOTOMY N/A 10/11/2020   Procedure: CRANIOTOMY FOR RESECTION OF MENINGIOMA;  Surgeon: [name], MD;  Location: [facility];  Service: Neurosurgery;  Laterality: N/A;  . REPLACEMENT TOTAL KNEE    . TONSILLECTOMY       OB History   No obstetric history on file.     History reviewed. No pertinent family history.  Social History   Tobacco Use  . Smoking status: Never Smoker  . Smokeless tobacco: Never Used  Substance Use Topics  . Alcohol use: Yes    Comment: occassional  . Drug use: No    Comment: occassional just a puff    Home Medications Prior to Admission medications   Medication Sig Start Date End Date Taking? Authorizing Provider  acetaminophen (TYLENOL) 500 MG tablet Take 500 mg by mouth every 6 (six) hours as needed.    [provider]  amLODipine (NORVASC) 10 MG tablet Take 10 mg by mouth daily. 06/21/13   [provider]  dexamethasone (DECADRON) 4 MG tablet Take 4mg  po bid for 4 days then 2mg  bid for 4 days then 2mg  daily for 4 days then stop 10/20/20   Kathie Dike, MD  glyBURIDE-metformin (GLUCOVANCE) 5-500 MG per tablet Take 1 tablet by mouth 3 (three) times daily. 05/04/13   [provider]  hydrochlorothiazide (MICROZIDE) 12.5 MG capsule Take 1 capsule (12.5 mg total) by mouth daily. 10/21/20   Kathie Dike, MD  HYDROcodone-acetaminophen (NORCO/VICODIN) 5-325 MG tablet Take 1-2 tablets by mouth every 6 (six) hours as needed for severe pain. 10/20/20   Kathie Dike, MD  Insulin Glargine (BASAGLAR KWIKPEN) 100 UNIT/ML Inject 70 Units into the skin daily. 10/20/20   Kathie Dike, MD  levETIRAcetam (KEPPRA) 500 MG tablet Take 1 tablet (500 mg total) by mouth 2 (two) times daily. 10/20/20   Kathie Dike, MD  lisinopril (PRINIVIL,ZESTRIL) 20 MG tablet Take 20 mg by mouth daily. 05/03/13   [provider]  Multiple Vitamins-Minerals (ZINC PO) Take 1 tablet by mouth daily.    [provider]   polyethylene glycol (MIRALAX / GLYCOLAX) 17 g packet Take 17 g by mouth daily as needed for mild constipation. 10/20/20   Kathie Dike, MD  pravastatin (PRAVACHOL) 40 MG tablet Take 40 mg by mouth daily. 06/21/13   [provider]  vitamin B-12 (CYANOCOBALAMIN) 1000 MCG tablet Take 1,000 mcg by mouth daily.    [provider]  vitamin C (ASCORBIC ACID) 500 MG tablet Take 500 mg by mouth daily.    [provider]    Allergies    Amoxicillin and Penicillins  Review of Systems   Review of Systems  Constitutional: Negative for chills and fever.  HENT: Negative for ear pain and sore throat.   Eyes: Negative for pain and visual disturbance.  Respiratory: Negative for cough and shortness of breath.   Cardiovascular: Negative for chest pain and palpitations.  Gastrointestinal: Negative for abdominal pain and vomiting.  Genitourinary: Negative for dysuria and hematuria.  Musculoskeletal: Negative for arthralgias and back pain.  Skin: Negative for color change and rash.  Neurological: Negative for syncope and light-headedness.  All other systems reviewed and are negative.   Physical Exam Updated Vital Signs BP (!) 99/58 (BP Location: Right Arm)   Pulse 74   Temp 98.4 F (36.9 C) (Oral)   Resp 20   Ht 5\' 4"  (1.626 m)   Wt 108 kg   SpO2 98%   BMI 40.87 kg/m   Physical Exam Constitutional:      General: She is not in acute distress. HENT:     Head: Normocephalic and atraumatic.  Eyes:     Conjunctiva/sclera: Conjunctivae normal.     Pupils: Pupils are equal, round, and reactive to light.  Cardiovascular:     Rate and Rhythm: Normal rate and regular rhythm.  Pulmonary:     Effort: Pulmonary effort is normal. No respiratory distress.  Abdominal:     General: There is no distension.     Tenderness: There is no abdominal tenderness.  Skin:    General: Skin is warm and dry.  Neurological:     General: No focal deficit present.     Mental Status:  She is alert. Mental status is at baseline.  Psychiatric:        Mood and Affect: Mood normal.        Behavior: Behavior normal.     ED Results / Procedures / Treatments   Labs (all labs ordered are listed, but only abnormal results are displayed) Labs Reviewed  BASIC METABOLIC PANEL - Abnormal; Notable for the following components:      Result Value   Sodium 132 (*)    Chloride 94 (*)    Glucose, Bld 449 (*)    All other components within normal limits  CBC - Abnormal; Notable for the following components:   RBC 5.31 (*)    Hemoglobin 15.2 (*)    All  other components within normal limits  URINALYSIS, ROUTINE W REFLEX MICROSCOPIC - Abnormal; Notable for the following components:   Glucose, UA >=500 (*)    All other components within normal limits  CBG MONITORING, ED - Abnormal; Notable for the following components:   Glucose-Capillary 436 (*)    All other components within normal limits    EKG None  Radiology No results found.  Procedures Procedures   Medications Ordered in ED Medications - No data to display  ED Course  I have reviewed the triage vital signs and the nursing notes.  Pertinent labs & imaging results that were available during my care of the patient were reviewed by me and considered in my medical decision making (see chart for details).  This patient complains of asymptomatic hyperglycemia.   I ordered, reviewed, and interpreted labs.  BS 449 on arrival, 436 on repeat.  No anion gap.  Cr normal.  CBC largely unremarkable.  UA with glucose, no ketones. I ordered medication insulin 70/30 at 45 units for hyperglycemia.  Breakfast food given with this.  She remained stable and asymptomatic on reassessment.  I explained her BS is likely not being adequately controlled on her current insulin regimen. Advised calling her PCP's office again, and we can titrate up her dosing 5 units with meals in the meantime.  Her average BS at home have been 300's per her  journal.  Otherwise I doubt DKA, HHS, infection, sepsis at this time.  Okay for discharge.    Final Clinical Impression(s) / ED Diagnoses Final diagnoses:  Hyperglycemia    Rx / DC Orders ED Discharge Orders    None       Jonus Coble, Carola Rhine, MD 12/22/20 1659

## 2020-12-22 NOTE — ED Notes (Signed)
436 CBG

## 2020-12-22 NOTE — ED Notes (Signed)
Breakfast tray given. °

## 2020-12-22 NOTE — ED Notes (Signed)
Got patient on the monitor patient is resting with call bell in reach got patient a warm blanket  

## 2020-12-22 NOTE — Discharge Instructions (Addendum)
You are seen in the emergency department for an episode of high blood sugar.  This can occur from uncontrolled diabetes.  This may be due to your diet (I included information to read over), or you may simply need higher doses of insulin to control your diabetes.  I would advise that you call your primary care provider's office today to arrange for follow-up appointment on Monday if possible for your insulin dosing.  In the meantime, I would advise you increase your morning AND dinner insulin dose by 5 units each, meaning 45 units before breakfast, and 35 units before dinner.  You can continue to take your bedtime dose as needed for high blood sugars, as prescribed by your primary doctor.  Your average blood sugars are still too high.  Your PCP should work with you on a plan to get your sugars under 200.

## 2020-12-22 NOTE — ED Notes (Signed)
Breakfast tray ordered 

## 2021-01-09 ENCOUNTER — Other Ambulatory Visit: Payer: Self-pay | Admitting: Neurosurgery

## 2021-01-09 DIAGNOSIS — D329 Benign neoplasm of meninges, unspecified: Secondary | ICD-10-CM

## 2021-01-24 ENCOUNTER — Other Ambulatory Visit: Payer: Self-pay | Admitting: Neurosurgery

## 2021-01-30 ENCOUNTER — Other Ambulatory Visit: Payer: Self-pay

## 2021-01-30 ENCOUNTER — Ambulatory Visit
Admission: RE | Admit: 2021-01-30 | Discharge: 2021-01-30 | Disposition: A | Discharge status: Home / Self Care | Payer: Federal, State, Local not specified - PPO | Source: Ambulatory Visit | Attending: Neurosurgery | Admitting: Neurosurgery

## 2021-01-30 DIAGNOSIS — D329 Benign neoplasm of meninges, unspecified: Secondary | ICD-10-CM

## 2021-01-30 MED ORDER — GADOBENATE DIMEGLUMINE 529 MG/ML IV SOLN
20.0000 mL | Freq: Once | INTRAVENOUS | Status: AC | PRN
  Administered 2021-01-30: 20 mL via INTRAVENOUS

## 2021-02-01 ENCOUNTER — Other Ambulatory Visit: Payer: Federal, State, Local not specified - PPO

## 2021-04-04 ENCOUNTER — Encounter: Payer: Self-pay | Admitting: Orthopaedic Surgery

## 2021-04-04 ENCOUNTER — Ambulatory Visit: Payer: Federal, State, Local not specified - PPO | Admitting: Orthopaedic Surgery

## 2021-04-04 ENCOUNTER — Ambulatory Visit: Payer: Self-pay

## 2021-04-04 DIAGNOSIS — M25512 Pain in left shoulder: Secondary | ICD-10-CM | POA: Diagnosis not present

## 2021-04-04 MED ORDER — LIDOCAINE HCL 1 % IJ SOLN
3.0000 mL | INTRAMUSCULAR | Status: AC | PRN
  Administered 2021-04-04: 3 mL

## 2021-04-04 MED ORDER — METHYLPREDNISOLONE ACETATE 40 MG/ML IJ SUSP
40.0000 mg | INTRAMUSCULAR | Status: AC | PRN
  Administered 2021-04-04: 40 mg via INTRA_ARTICULAR

## 2021-04-04 NOTE — Progress Notes (Signed)
Office Visit Note   Patient: Regina Mckay           Date of Birth: 1951-06-06           MRN: [id number] Visit Date: 04/04/2021              Requested by: Janie Morning, DO [address],  [place] [address] PCP: Janie Morning, DO   Assessment & Plan: Visit Diagnoses:  1. Left shoulder pain, unspecified chronicity     Plan: I gave the patient reassurance that there was no fracture.  I do feel that her fall aggravated probably some tendinitis and bursitis for the shoulder.  I felt it was reasonable to try steroid injection and she agreed to this and tolerated it well in the left shoulder subacromial space.  Follow-up can be as needed.  Follow-Up Instructions: Return if symptoms worsen or fail to improve.   Orders:  Orders Placed This Encounter  Procedures   Large Joint Inj   XR Shoulder Left   No orders of the defined types were placed in this encounter.     Procedures: Large Joint Inj: L subacromial bursa on 04/04/2021 4:32 PM Indications: pain and diagnostic evaluation Details: 22 G 1.5 in needle  Arthrogram: No  Medications: 3 mL lidocaine 1 %; 40 mg methylPREDNISolone acetate 40 MG/ML Outcome: tolerated well, no immediate complications Procedure, treatment alternatives, risks and benefits explained, specific risks discussed. Consent was given by the patient. Immediately prior to procedure a time out was called to verify the correct patient, procedure, equipment, support staff and site/side marked as required. Patient was prepped and draped in the usual sterile fashion.      Clinical Data: No additional findings.   Subjective: Chief Complaint  Patient presents with   Left Arm - Injury  The patient is a 70 year old female that I have not seen in some time.  She is well-known to me.  She had a mechanical fall about 5 weeks ago or so when she excellently tripped and fell.  She actually had a benign brain tumor removed in March.  She saw her  primary care physician several weeks ago and x-rays were apparently negative for any type of fracture.  We do not have those x-rays.  She reports left shoulder pain and points to the subacromial and subdeltoid region is a source of her pain on the left shoulder.  She does report some weakness as well.  It hurts with overhead activities and reaching behind her.  She has been taking hydrocodone for pain.  There is no neck pain and no radicular component of her pain.   Review of Systems There is currently no listed headache, chest pain, shortness of breath, fever, chills, nausea, vomiting  Objective: Vital Signs: There were no vitals taken for this visit.  Physical Exam She is alert and orient x3 and in no acute distress Ortho Exam Examination of her left shoulder shows that she is able to abduct her shoulder on her own and does not seem to use a lot of the deltoid abduct the shoulder so I do feel the rotator cuff is mainly intact.  Her pain is definitely around the subdeltoid and subacromial area of the shoulder and she hurts quite a bit with abduction up to 90 degrees and just beyond that.  Reaching behind her is also painful with the left shoulder. Specialty Comments:  No specialty comments available.  Imaging: XR Shoulder Left  Result Date: 04/04/2021 3  views of the left shoulder show no acute findings.  There is glenohumeral arthritic changes but no evidence of fracture.  The shoulder is well located.    PMFS History: Patient Active Problem List   Diagnosis Date Noted   Speech abnormality 10/10/2020   Episode of transient neurologic symptoms 10/09/2020   Uncontrolled diabetes mellitus with hyperglycemia ([place]) 10/09/2020   Polycythemia 10/09/2020   Hypertensive urgency 10/09/2020   Meningioma [facility])    Past Medical History:  Diagnosis Date   Diabetes mellitus without complication ([place])    Hypertension     History reviewed. No pertinent family history.  Past Surgical History:   Procedure Laterality Date   APPLICATION OF CRANIAL NAVIGATION N/A 10/11/2020   Procedure: APPLICATION OF CRANIAL NAVIGATION;  Surgeon: [name], MD;  Location: [facility];  Service: Neurosurgery;  Laterality: N/A;   CRANIOTOMY N/A 10/11/2020   Procedure: CRANIOTOMY FOR RESECTION OF MENINGIOMA;  Surgeon: [name], MD;  Location: [facility];  Service: Neurosurgery;  Laterality: N/A;   REPLACEMENT TOTAL KNEE     TONSILLECTOMY     Social History   Occupational History   Not on file  Tobacco Use   Smoking status: Never   Smokeless tobacco: Never  Substance and Sexual Activity   Alcohol use: Yes    Comment: occassional   Drug use: No    Comment: occassional just a puff   Sexual activity: Not on file

## 2021-04-15 ENCOUNTER — Other Ambulatory Visit: Payer: Self-pay

## 2021-04-15 ENCOUNTER — Telehealth: Payer: Self-pay | Admitting: Orthopaedic Surgery

## 2021-04-15 DIAGNOSIS — M25512 Pain in left shoulder: Secondary | ICD-10-CM

## 2021-04-15 NOTE — Telephone Encounter (Signed)
Referral sent, patient aware  

## 2021-04-15 NOTE — Telephone Encounter (Signed)
Pt came in stating she had an appt on 04/04/21 and get an inj for pain. Pt states it eased the pain but didn't make it totally go away. Pt would like to know if she can try another injection to get rid of the remaining pain. Pt would like a CB.    [phone]

## 2021-04-17 ENCOUNTER — Other Ambulatory Visit: Payer: Self-pay | Admitting: Neurosurgery

## 2021-04-17 DIAGNOSIS — D329 Benign neoplasm of meninges, unspecified: Secondary | ICD-10-CM

## 2021-04-30 ENCOUNTER — Ambulatory Visit (INDEPENDENT_AMBULATORY_CARE_PROVIDER_SITE_OTHER): Payer: Federal, State, Local not specified - PPO | Admitting: Physical Medicine and Rehabilitation

## 2021-04-30 ENCOUNTER — Ambulatory Visit: Payer: Self-pay

## 2021-04-30 ENCOUNTER — Other Ambulatory Visit: Payer: Self-pay

## 2021-04-30 ENCOUNTER — Encounter: Payer: Self-pay | Admitting: Physical Medicine and Rehabilitation

## 2021-04-30 DIAGNOSIS — M25512 Pain in left shoulder: Secondary | ICD-10-CM

## 2021-04-30 DIAGNOSIS — G8929 Other chronic pain: Secondary | ICD-10-CM | POA: Diagnosis not present

## 2021-04-30 NOTE — Progress Notes (Signed)
Pt state left shoulder pain. Pt state when she lift her arm the pain gets worse. Pt state she takes over the counter pain meds and uses heating to help ease her pain.   Numeric Pain Rating Scale and Functional Assessment Average Pain 10   In the last MONTH (on 0-10 scale) has pain interfered with the following?  1. General activity like being  able to carry out your everyday physical activities such as walking, climbing stairs, carrying groceries, or moving a chair?  Rating(10)  -BT, -Dye Allergies.

## 2021-04-30 NOTE — Progress Notes (Signed)
   Regina Mckay - 70 y.o. female MRN [id number]  Date of birth: August 20, 1950  Office Visit Note: Visit Date: 04/30/2021 PCP: Janie Morning, DO Referred by: Janie Morning, DO  Subjective: Chief Complaint  Patient presents with   Left Shoulder - Pain   HPI:  Regina Mckay is a 70 y.o. female who comes in today at the request of Dr. Jean Rosenthal for planned Left anesthetic glenohumeral arthrogram with fluoroscopic guidance.  The patient has failed conservative care including home exercise, medications, time and activity modification.  This injection will be diagnostic and hopefully therapeutic.  Please see requesting physician notes for further details and justification.   ROS Otherwise per HPI.  Assessment & Plan: Visit Diagnoses:    ICD-10-CM   1. Chronic left shoulder pain  M25.512 Large Joint Inj: L glenohumeral   G89.29 XR C-ARM NO REPORT      Plan: No additional findings.   Meds & Orders: No orders of the defined types were placed in this encounter.   Orders Placed This Encounter  Procedures   Large Joint Inj: L glenohumeral   XR C-ARM NO REPORT    Follow-up: Return for visit to requesting physician as needed.   Procedures: Large Joint Inj: L glenohumeral on 04/30/2021 1:04 PM Indications: pain and diagnostic evaluation Details: 22 G 3.5 in needle, fluoroscopy-guided anteromedial approach  Arthrogram: No  Medications: 3 mL bupivacaine 0.5 %; 60 mg triamcinolone acetonide 40 MG/ML Outcome: tolerated well, no immediate complications  There was excellent flow of contrast producing a partial arthrogram of the glenohumeral joint. The patient did have relief of symptoms during the anesthetic phase of the injection. Procedure, treatment alternatives, risks and benefits explained, specific risks discussed. Consent was given by the patient. Immediately prior to procedure a time out was called to verify the correct patient, procedure, equipment, support staff and site/side  marked as required. Patient was prepped and draped in the usual sterile fashion.         Clinical History: No specialty comments available.     Objective:  VS:  HT:    WT:   BMI:     BP:   HR: bpm  TEMP: ( )  RESP:  Physical Exam   Imaging: No results found.

## 2021-05-05 MED ORDER — TRIAMCINOLONE ACETONIDE 40 MG/ML IJ SUSP
60.0000 mg | INTRAMUSCULAR | Status: AC | PRN
  Administered 2021-04-30: 60 mg via INTRA_ARTICULAR

## 2021-05-05 MED ORDER — BUPIVACAINE HCL 0.5 % IJ SOLN
3.0000 mL | INTRAMUSCULAR | Status: AC | PRN
  Administered 2021-04-30: 3 mL via INTRA_ARTICULAR

## 2021-05-13 ENCOUNTER — Encounter: Payer: Self-pay | Admitting: Orthopaedic Surgery

## 2021-05-20 ENCOUNTER — Encounter: Payer: Self-pay | Admitting: Physical Medicine and Rehabilitation

## 2021-07-08 ENCOUNTER — Ambulatory Visit
Admission: RE | Admit: 2021-07-08 | Discharge: 2021-07-08 | Disposition: A | Discharge status: Home / Self Care | Payer: Federal, State, Local not specified - PPO | Source: Ambulatory Visit | Attending: Neurosurgery | Admitting: Neurosurgery

## 2021-07-08 ENCOUNTER — Other Ambulatory Visit: Payer: Self-pay

## 2021-07-08 DIAGNOSIS — D329 Benign neoplasm of meninges, unspecified: Secondary | ICD-10-CM

## 2021-07-08 MED ORDER — GADOBENATE DIMEGLUMINE 529 MG/ML IV SOLN
20.0000 mL | Freq: Once | INTRAVENOUS | Status: AC | PRN
  Administered 2021-07-08: 20 mL via INTRAVENOUS

## 2021-08-06 ENCOUNTER — Encounter (HOSPITAL_COMMUNITY): Payer: Self-pay | Admitting: Radiology

## 2021-08-22 ENCOUNTER — Other Ambulatory Visit: Payer: Self-pay | Admitting: Orthopaedic Surgery

## 2021-08-22 ENCOUNTER — Ambulatory Visit (INDEPENDENT_AMBULATORY_CARE_PROVIDER_SITE_OTHER): Payer: Federal, State, Local not specified - PPO | Admitting: Orthopaedic Surgery

## 2021-08-22 DIAGNOSIS — M25512 Pain in left shoulder: Secondary | ICD-10-CM

## 2021-08-22 MED ORDER — METHYLPREDNISOLONE ACETATE 40 MG/ML IJ SUSP
40.0000 mg | INTRAMUSCULAR | Status: AC | PRN
  Administered 2021-08-22: 40 mg via INTRA_ARTICULAR

## 2021-08-22 MED ORDER — LIDOCAINE HCL 1 % IJ SOLN
3.0000 mL | INTRAMUSCULAR | Status: AC | PRN
  Administered 2021-08-22: 3 mL

## 2021-08-22 MED ORDER — LIDOCAINE 5 % EX PTCH: 1.0000 | MEDICATED_PATCH | CUTANEOUS | 0 refills | Status: DC

## 2021-08-22 NOTE — Progress Notes (Signed)
Office Visit Note   Patient: Regina Mckay           Date of Birth: [birth year]           MRN: [id number] Visit Date: 08/22/2021              Requested by: Janie Morning, DO [address] [place],  [place] [address] PCP: Janie Morning, DO   Assessment & Plan: Visit Diagnoses:  1. Left shoulder pain, unspecified chronicity     Plan: I did recommend trying 1 more steroid injection today since she is feeling a little better.  She agreed to this and will watch her blood glucose closely.  She tolerated the injection well.  My next recommendation is trying at least Lidoderm patches as needed if insurance will cover this.  Follow-Up Instructions: Return if symptoms worsen or fail to improve.   Orders:  Orders Placed This Encounter  Procedures   Large Joint Inj   Meds ordered this encounter  Medications   lidocaine (LIDODERM) 5 %    Sig: Place 1 patch onto the skin daily. Remove & Discard patch within 12 hours or as directed by MD    Dispense:  30 patch    Refill:  0      Procedures: Large Joint Inj: L subacromial bursa on 08/22/2021 2:57 PM Indications: pain and diagnostic evaluation Details: 22 G 1.5 in needle  Arthrogram: No  Medications: 3 mL lidocaine 1 %; 40 mg methylPREDNISolone acetate 40 MG/ML Outcome: tolerated well, no immediate complications Procedure, treatment alternatives, risks and benefits explained, specific risks discussed. Consent was given by the patient. Immediately prior to procedure a time out was called to verify the correct patient, procedure, equipment, support staff and site/side marked as required. Patient was prepped and draped in the usual sterile fashion.      Clinical Data: No additional findings.   Subjective: Chief Complaint  Patient presents with   Left Shoulder - Pain  The patient is a 71 year old female who comes in for evaluation treatment of left upper arm pain.  I actually seen her for this before and provided a  steroid injection subacromial outlet of her shoulder back in September.  She says that the arm is better but is still painful range of motion and she points to the subdeltoid area and into the mid arm as a source of her pain of the upper arm.  She denies any falls or any injuries.  I talked her about the possibility physical therapy but she says she does not really believe in physical therapy.  She is a diabetic and at one point a year ago had a hemoglobin A1c of 12 and then it came down to 7.5.  She says she is under much better control even today.  She is frustrated that her arm still hurts but again it is not as bad as before.  HPI  Review of Systems There is no listed headache, chest pain, shortness of breath, fever, chills, nausea, vomiting  Objective: Vital Signs: There were no vitals taken for this visit.  Physical Exam She is alert and orient x3 and in no acute distress Ortho Exam Examination of her left shoulder shows pain to palpation in the subdeltoid and subacromial areas of her shoulder and pain with abduction of the shoulder consistent with impingement, tendinitis and bursitis. Specialty Comments:  No specialty comments available.  Imaging: No results found. Previous x-rays were reviewed again today showing no acute findings with  her left shoulder.  This also shows the proximal humerus.  PMFS History: Patient Active Problem List   Diagnosis Date Noted   Speech abnormality 10/10/2020   Episode of transient neurologic symptoms 10/09/2020   Uncontrolled diabetes mellitus with hyperglycemia ([place]) 10/09/2020   Polycythemia 10/09/2020   Hypertensive urgency 10/09/2020   Meningioma [facility])    Past Medical History:  Diagnosis Date   Diabetes mellitus without complication ([place])    Hypertension     No family history on file.  Past Surgical History:  Procedure Laterality Date   APPLICATION OF CRANIAL NAVIGATION N/A 10/11/2020   Procedure: APPLICATION OF CRANIAL NAVIGATION;   Surgeon: [name], MD;  Location: [facility];  Service: Neurosurgery;  Laterality: N/A;   CRANIOTOMY N/A 10/11/2020   Procedure: CRANIOTOMY FOR RESECTION OF MENINGIOMA;  Surgeon: [name], MD;  Location: [facility];  Service: Neurosurgery;  Laterality: N/A;   REPLACEMENT TOTAL KNEE     TONSILLECTOMY     Social History   Occupational History   Not on file  Tobacco Use   Smoking status: Never   Smokeless tobacco: Never  Substance and Sexual Activity   Alcohol use: Yes    Comment: occassional   Drug use: No    Comment: occassional just a puff   Sexual activity: Not on file

## 2021-09-11 ENCOUNTER — Encounter: Payer: Self-pay | Admitting: Internal Medicine

## 2021-09-12 ENCOUNTER — Other Ambulatory Visit: Payer: Self-pay

## 2021-09-12 ENCOUNTER — Ambulatory Visit: Payer: Federal, State, Local not specified - PPO | Admitting: Nurse Practitioner

## 2021-09-12 VITALS — BP 160/78 | HR 105 | Temp 98.2°F | Ht 64.0 in | Wt 246.0 lb

## 2021-09-12 DIAGNOSIS — Z1231 Encounter for screening mammogram for malignant neoplasm of breast: Secondary | ICD-10-CM | POA: Diagnosis not present

## 2021-09-12 DIAGNOSIS — E119 Type 2 diabetes mellitus without complications: Secondary | ICD-10-CM

## 2021-09-12 DIAGNOSIS — Z86011 Personal history of benign neoplasm of the brain: Secondary | ICD-10-CM

## 2021-09-12 DIAGNOSIS — R569 Unspecified convulsions: Secondary | ICD-10-CM

## 2021-09-12 DIAGNOSIS — Z6841 Body Mass Index (BMI) 40.0 and over, adult: Secondary | ICD-10-CM

## 2021-09-12 DIAGNOSIS — Z794 Long term (current) use of insulin: Secondary | ICD-10-CM

## 2021-09-12 DIAGNOSIS — I10 Essential (primary) hypertension: Secondary | ICD-10-CM
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
## 2021-09-13 ENCOUNTER — Encounter: Payer: Self-pay | Admitting: Internal Medicine

## 2021-09-13 ENCOUNTER — Other Ambulatory Visit (INDEPENDENT_AMBULATORY_CARE_PROVIDER_SITE_OTHER): Payer: Federal, State, Local not specified - PPO

## 2021-09-13 ENCOUNTER — Ambulatory Visit: Payer: Federal, State, Local not specified - PPO | Admitting: Internal Medicine

## 2021-09-13 VITALS — BP 128/68 | HR 101 | Ht 64.0 in | Wt 248.2 lb

## 2021-09-13 DIAGNOSIS — Z6841 Body Mass Index (BMI) 40.0 and over, adult: Secondary | ICD-10-CM | POA: Diagnosis not present

## 2021-09-13 DIAGNOSIS — Z794 Long term (current) use of insulin: Secondary | ICD-10-CM

## 2021-09-13 DIAGNOSIS — E113211 Type 2 diabetes mellitus with mild nonproliferative diabetic retinopathy with macular edema, right eye: Secondary | ICD-10-CM | POA: Diagnosis not present

## 2021-09-13 DIAGNOSIS — R569 Unspecified convulsions: Secondary | ICD-10-CM | POA: Diagnosis not present

## 2021-09-13 DIAGNOSIS — Z1231 Encounter for screening mammogram for malignant neoplasm of breast: Secondary | ICD-10-CM | POA: Diagnosis not present

## 2021-09-13 LAB — COMPREHENSIVE METABOLIC PANEL
ALT: 16 U/L (ref 0–35)
AST: 19 U/L (ref 0–37)
Albumin: 4.1 g/dL (ref 3.5–5.2)
Alkaline Phosphatase: 52 U/L (ref 39–117)
BUN: 18 mg/dL (ref 6–23)
CO2: 31 mEq/L (ref 19–32)
Calcium: 9.6 mg/dL (ref 8.4–10.5)
Chloride: 96 mEq/L (ref 96–112)
Creatinine, Ser: 0.9 mg/dL (ref 0.40–1.20)
GFR: 63.88 mL/min (ref >60.00)
Glucose, Bld: 236 mg/dL — ABNORMAL HIGH (ref 70–99)
Potassium: 4.1 mEq/L (ref 3.5–5.1)
Sodium: 134 mEq/L — ABNORMAL LOW (ref 135–145)
Total Bilirubin: 1.1 mg/dL (ref 0.2–1.2)
Total Protein: 7.4 g/dL (ref 6.0–8.3)

## 2021-09-13 LAB — POCT GLYCOSYLATED HEMOGLOBIN (HGB A1C): Hemoglobin A1C: 8.1 % — AB (ref 4.0–5.6)

## 2021-09-13 LAB — TSH: TSH: 2.75 u[IU]/mL (ref 0.35–5.50)

## 2021-09-13 MED ORDER — METFORMIN HCL 500 MG PO TABS: 500.0000 mg | ORAL_TABLET | Freq: Two times a day (BID) | ORAL | 3 refills | Status: DC

## 2021-09-13 MED ORDER — INSULIN PEN NEEDLE 32G X 4 MM MISC: 3 refills | Status: DC

## 2021-09-13 MED ORDER — BASAGLAR KWIKPEN 100 UNIT/ML ~~LOC~~ SOPN: 70.0000 [IU] | PEN_INJECTOR | Freq: Every day | SUBCUTANEOUS | 3 refills | Status: DC

## 2021-09-13 MED ORDER — NOVOLOG FLEXPEN 100 UNIT/ML ~~LOC~~ SOPN: 16.0000 [IU] | PEN_INJECTOR | Freq: Three times a day (TID) | SUBCUTANEOUS | 3 refills | Status: DC

## 2021-09-13 NOTE — Patient Instructions (Addendum)
Please start: - Metformin 500 mg with dinner x4 days, then increase to 500 mg 2x a day with meals  Continue: - Ozempic 0.5 mg weekly  Stop Novolog 70/30 and start: - Basaglar 70 units in am - Novolog 15 min before meals - only if you eat a meal: 16 units before b'fast 16 units before lunch 12 units before dinner  Please return in 1.5 months.  PATIENT INSTRUCTIONS FOR TYPE 2 DIABETES:  DIET AND EXERCISE Diet and exercise is an important part of diabetic treatment.  We recommended aerobic exercise in the form of brisk walking (working between 40-60% of maximal aerobic capacity, similar to brisk walking) for 150 minutes per week (such as 30 minutes five days per week) along with 3 times per week performing 'resistance' training (using various gauge rubber tubes with handles) 5-10 exercises involving the major muscle groups (upper body, lower body and core) performing 10-15 repetitions (or near fatigue) each exercise. Start at half the above goal but build slowly to reach the above goals. If limited by weight, joint pain, or disability, we recommend daily walking in a swimming pool with water up to waist to reduce pressure from joints while allow for adequate exercise.    BLOOD GLUCOSES Monitoring your blood glucoses is important for continued management of your diabetes. Please check your blood glucoses 2-4 times a day: fasting, before meals and at bedtime (you can rotate these measurements - e.g. one day check before the 3 meals, the next day check before 2 of the meals and before bedtime, etc.).   HYPOGLYCEMIA (low blood sugar) Hypoglycemia is usually a reaction to not eating, exercising, or taking too much insulin/ other diabetes drugs.  Symptoms include tremors, sweating, hunger, confusion, headache, etc. Treat IMMEDIATELY with 15 grams of Carbs: 4 glucose tablets  cup regular juice/soda 2 tablespoons raisins 4 teaspoons sugar 1 tablespoon honey Recheck blood glucose in 15 mins and  repeat above if still symptomatic/blood glucose <100.  RECOMMENDATIONS TO REDUCE YOUR RISK OF DIABETIC COMPLICATIONS: * Take your prescribed MEDICATION(S) * Follow a DIABETIC diet: Complex carbs, fiber rich foods, (monounsaturated and polyunsaturated) fats * AVOID saturated/trans fats, high fat foods, >2,300 mg salt per day. * EXERCISE at least 5 times a week for 30 minutes or preferably daily.  * DO NOT SMOKE OR DRINK more than 1 drink a day. * Check your FEET every day. Do not wear tightfitting shoes. Contact us if you develop an ulcer * See your EYE doctor once a year or more if needed * Get a FLU shot once a year * Get a PNEUMONIA vaccine once before and once after age 34 years  GOALS:  * Your Hemoglobin A1c of <7%  * fasting sugars need to be <130 * after meals sugars need to be <180 (2h after you start eating) * Your Systolic BP should be 474 or lower  * Your Diastolic BP should be 80 or lower  * Your HDL (Good Cholesterol) should be 40 or higher  * Your LDL (Bad Cholesterol) should be 100 or lower. * Your Triglycerides should be 150 or lower  * Your Urine microalbumin (kidney function) should be <30 * Your Body Mass Index should be 25 or lower    Please consider the following ways to cut down carbs and fat and increase fiber and micronutrients in your diet: - substitute whole grain for white bread or pasta - substitute brown rice for white rice - substitute 90-calorie flat bread pieces for slices  of bread when possible - substitute sweet potatoes or yams for white potatoes - substitute humus for margarine - substitute tofu for cheese when possible - substitute almond or rice milk for regular milk (would not drink soy milk daily due to concern for soy estrogen influence on breast cancer risk) - substitute dark chocolate for other sweets when possible - substitute water - can add lemon or orange slices for taste - for diet sodas (artificial sweeteners will trick your body that  you can eat sweets without getting calories and will lead you to overeating and weight gain in the long run) - do not skip breakfast or other meals (this will slow down the metabolism and will result in more weight gain over time)  - can try smoothies made from fruit and almond/rice milk in am instead of regular breakfast - can also try old-fashioned (not instant) oatmeal made with almond/rice milk in am - order the dressing on the side when eating salad at a restaurant (pour less than half of the dressing on the salad) - eat as little meat as possible - can try juicing, but should not forget that juicing will get rid of the fiber, so would alternate with eating raw veg./fruits or drinking smoothies - use as little oil as possible, even when using olive oil - can dress a salad with a mix of balsamic vinegar and lemon juice, for e.g. - use agave nectar, stevia sugar, or regular sugar rather than artificial sweateners - steam or broil/roast veggies  - snack on veggies/fruit/nuts (unsalted, preferably) when possible, rather than processed foods - reduce or eliminate aspartame in diet (it is in diet sodas, chewing gum, etc) Read the labels!  Try to read Dr. Janene Harvey book: "Program for Reversing Diabetes" for other ideas for healthy eating.

## 2021-09-13 NOTE — Progress Notes (Signed)
Patient ID: Regina Mckay, female   DOB: 06-Nov-1950, 71 y.o.   MRN: [id number]  This visit occurred during the SARS-CoV-2 public health emergency.  Safety protocols were in place, including screening questions prior to the visit, additional usage of staff PPE, and extensive cleaning of exam room while observing appropriate contact time as indicated for disinfecting solutions.   HPI: Regina Mckay is a 71 y.o.-year-old female, referred by Dr. Janie Morning, for management of DM2, dx at the end of 1990s, insulin-dependent soon after dx, uncontrolled, with complications (diabetic retinopathy, PN).  She is the sister of Regina Mckay, who is also my patient.  Reviewed HbA1c: 07/05/2021: HbA1c 7.0%; 1,5 Anhydroglucitol 11.2 (6.9-29.3) Lab Results  Component Value Date   HGBA1C 13.6 (H) 10/10/2020   Pt is on a regimen of: - Ozempic 0.5 mg weekly >> decreased appetite, "nothing tastes good" - Novolog 70/30: 55-45-30 units before B-L-D. Adds 5-10 units depending on the sugars before meals. She was on: Metformin-glyburide 500-5 mg 3x a day, with meals. Previously on Basaglar 70 units at bedtime.  Pt checks her sugars >4x a day with her Freestyle Libre CGM:   Lowest sugar was 60s; she has hypoglycemia awareness at 100.  Highest sugar was 300s. She was in the emergency room with hyperglycemia in 12/2020.  She had steroid inj's in shoulder: 2 in 04/2021 and 1 in 08/2021. She increased fried foods in last 2 months.  Pt's meals are: - Breakfast:  - Lunch: - Dinner: - Snacks:  - no CKD, last BUN/creatinine:   2021-07-05     Sodium 137   136-145  Potassium 4.4   3.5-5.1  Chloride 99   98-107  CO2 31   21-32  Glucose 192   74-106  BUN 20   7-18  Creatinine 0.95   0.55-1.02  Calcium 9.0   8.5-10.1  Protein 7.3   6.4-8.2  Albumin 3.8   3.4-5.0  Alkaline Phosphatase 64   25-150  ALT (SGPT) 21   <6-78  AST (SGOT) 16   0-40  Bilirubin, Total 0.8   0.2-1.0  Globulin, Calculated 3.5   1.5-4.6   Albumin/Globulin Ratio 1.1   1.1-2.5  BUN/Creatinine Ratio 21.1   11.0-26.0  GFR/Black 69   >59  Prev.: Lab Results  Component Value Date   BUN 22 12/22/2020   BUN 26 (H) 10/17/2020   CREATININE 0.97 12/22/2020   CREATININE 0.81 10/17/2020  On lisinopril 20 mg daily.  - + HL; last set of lipids: Lipid Panel   2021-07-05    Cholesterol 146   <200  Triglycerides 56   <150  HDL Cholesterol 55   >39  Cholesterol / HDL Ratio 2.65   0.00-4.44  Non-HDL Cholesterol 91   <130  LDL Cholesterol (Calculation) 80   <130   No results found for: CHOL, HDL, LDLCALC, LDLDIRECT, TRIG, CHOLHDL On pravastatin 40 mg daily.  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 Coming up  - with a new D.  - no numbness and tingling in her feet.  Pt has FH of DM in B and S.  She has a history of HTN, meningioma, s/p resection 10/2020.  ROS: Constitutional: + weight gain, + weight loss, + fatigue, no subjective hyperthermia, + subjective hypothermia, no nocturia; + insomnia Eyes: no blurry vision, no xerophthalmia ENT: no sore throat, no nodules palpated in neck, no dysphagia, no odynophagia, no hoarseness, no tinnitus, no hypoacusis  Cardiovascular: no CP, no SOB, no palpitations, no leg swelling Respiratory: no cough, no SOB, no wheezing Gastrointestinal: no N, no V, no D, no C, no acid reflux Musculoskeletal: no muscle, no joint aches Skin: no rash, no hair loss Neurological: no tremors, no numbness or tingling/no dizziness/no HAs Psychiatric: + depression, + anxiety  Past Medical History:  Diagnosis Date   Diabetes mellitus without complication ([place])    Hypertension    Past Surgical History:  Procedure Laterality Date   APPLICATION OF CRANIAL NAVIGATION N/A 10/11/2020   Procedure: APPLICATION OF CRANIAL NAVIGATION;  Surgeon: [name], MD;  Location: [place] [facility];  Service: Neurosurgery;  Laterality: N/A;   CRANIOTOMY N/A  10/11/2020   Procedure: CRANIOTOMY FOR RESECTION OF MENINGIOMA;  Surgeon: [name], MD;  Location: [place];  Service: Neurosurgery;  Laterality: N/A;   REPLACEMENT TOTAL KNEE     TONSILLECTOMY     Social History   Socioeconomic History   Marital status: Divorced    Spouse name: Not on file   Number of children: 1   Years of education: Not on file   Highest education level: Not on file  Occupational History   Occupation: retired   Occupation: Retired from [employer]   Smoking status: Unknown   Smokeless tobacco: Former    Quit date: 1970  Substance and Sexual Activity   Alcohol use: Not Currently   Drug use: No   Sexual activity: Not Currently  Other Topics Concern   Not on file  Social History Narrative   Not on file   Social Determinants of Health   Financial Resource Strain: Not on file  Food Insecurity: Not on file  Transportation Needs: Not on file  Physical Activity: Not on file  Stress: Not on file  Social Connections: Not on file  Intimate Partner Violence: Not on file   Current Outpatient Medications on File Prior to Visit  Medication Sig Dispense Refill   acetaminophen (TYLENOL) 500 MG tablet Take by mouth.     amLODipine (NORVASC) 10 MG tablet Take 10 mg by mouth daily.     bismuth subsalicylate (PEPTO-BISMOL) 262 MG chewable tablet Chew 524 mg by mouth as needed.     Continuous Blood Gluc Sensor (FREESTYLE LIBRE 14 DAY SENSOR) MISC See admin instructions.     Dextrose, Diabetic Use, (GLUCOSE PO) Take by mouth. Patient uses Glucose Tablets with 15 grams of carbohydrates.     hydrochlorothiazide (MICROZIDE) 12.5 MG capsule Take 1 capsule (12.5 mg total) by mouth daily. 30 capsule 0   HYDROcodone-acetaminophen (NORCO) 7.5-325 MG tablet Take 1-2 tablets by mouth every 6 (six) hours as needed.     Insulin Asp Prot & Asp FlexPen (NOVOLOG 70/30 MIX) (70-30) 100 UNIT/ML FlexPen If CBG is 101-200 take normal dose, if CBG is 201-400 increase by 5 units,  if CBG is over 400 increase by 10 units     Insulin Pen Needle (B-D ULTRAFINE III SHORT PEN) 31G X 8 MM MISC See admin instructions.     levETIRAcetam (KEPPRA) 500 MG tablet Take 1 tablet (500 mg total) by mouth 2 (two) times daily. 60 tablet 1   lidocaine (LIDODERM) 5 % PLACE 1 PATCH ONTO THE SKIN DAILY. REMOVE& DISCARD PATCH WITHIN 12 HOURS 90 patch 0   lisinopril (PRINIVIL,ZESTRIL) 20 MG tablet Take 20 mg by mouth daily.     Multiple Vitamins-Minerals (ZINC PO) Take 1 tablet by mouth daily. (Patient not taking: Reported on 09/12/2021)  polyethylene glycol (MIRALAX / GLYCOLAX) 17 g packet Take 17 g by mouth daily as needed for mild constipation. 14 each 0   pravastatin (PRAVACHOL) 40 MG tablet Take 40 mg by mouth daily.     Semaglutide,0.25 or 0.5MG /DOS, (OZEMPIC, 0.25 OR 0.5 MG/DOSE,) 2 MG/1.5ML SOPN Inject into the skin.     No current facility-administered medications on file prior to visit.   Allergies  Allergen Reactions   Amoxicillin Shortness Of Breath   Penicillins Shortness Of Breath    Did it involve swelling of the face/tongue/throat, SOB, or low BP? Yes Did it involve sudden or severe rash/hives, skin peeling, or any reaction on the inside of your mouth or nose? No Did you need to seek medical attention at a hospital or doctor's office? Yes When did it last happen?       If all above answers are NO, may proceed with cephalosporin use.    Family History  Problem Relation Age of Onset   Alcohol abuse Mother    Arthritis Mother    Stroke Mother    Alcohol abuse Father    Arthritis Father    Alcohol abuse Sister    Diabetes Sister    Alcohol abuse Sister    Arthritis Sister    Alcohol abuse Brother    Alcohol abuse Brother    Alcohol abuse Brother    Diabetes Brother    Alcohol abuse Daughter    PE: BP 128/68 (BP Location: Left Arm, Patient Position: Sitting, Cuff Size: Normal)    Pulse (!) 101    Ht 5\' 4"  (1.626 m)    Wt 248 lb 3.2 oz (112.6 kg)    SpO2 98%     BMI 42.60 kg/m  Wt Readings from Last 3 Encounters:  09/13/21 248 lb 3.2 oz (112.6 kg)  09/12/21 246 lb (111.6 kg)  12/22/20 238 lb 1.6 oz (108 kg)   Constitutional: overweight, in NAD Eyes: PERRLA, EOMI, no exophthalmos ENT: moist mucous membranes, no thyromegaly, no cervical lymphadenopathy Cardiovascular: Tachycardia, RR, No MRG Respiratory: CTA B Musculoskeletal: no deformities, strength intact in all 4 Skin: moist, warm, no rashes Neurological: no tremor with outstretched hands, DTR normal in all 4  ASSESSMENT: 1. DM2, insulin-dependent, uncontrolled, with complications - mild NPDR OD with macular edema  - PN  PLAN:  1. Patient with long-standing, uncontrolled diabetes, on injectable antidiabetic regimen with premixed insulin regimen and also weekly GLP-1 receptor agonist, with significantly improved control last year.  Her HbA1c decreased from 13.6% in 10/2018 2 to 7.0% in 07/2021. At today's visit, however, HbA1c has increased to 8.1%. CGM interpretation: -At today's visit, we reviewed her CGM downloads: It appears that 10% of values are in target range (goal >70%), while 90% are higher than 180 (goal <25%), and 0% are lower than 70 (goal <4%).  The calculated average blood sugar is 228.  The projected HbA1c for the next 3 months (GMI) is 8.8%. -Reviewing the CGM trends, the sugars appear to be high at all times of the day, slightly improving overnight but increasing slightly after breakfast and more after lunch and dinner. -At this visit we discussed about the importance of stopping fried foods, which she increased since the last HbA1c check, which I believe is the reason for her deteriorating diabetes control since then. -For now, I advised her to continue Ozempic at the same dose.  She describes reduced appetite and increased satiety and also decreased taste.  Therefore, I did not suggest an increase  in dose today.   -I did suggest to add metformin 500 mg initially once a day  and then increase to twice a day to help with his glucose variability -I also recommended to switch from a premixed insulin regimen, which is not adjustable, to a basal-bolus insulin regimen.  She agrees with this plan.  She still has [employer] at home from when she used it before.  Discussed about starting doses for NovoLog and I advised her to only take this if she plans to eat.  Recommended to take NovoLog 15 minutes before meals. - I suggested to:  Patient Instructions  Please start: - Metformin 500 mg with dinner x4 days, then increase to 500 mg 2x a day with meals  Continue: - Ozempic 0.5 mg weekly  Stop Novolog 70/30 and start: - Basaglar 70 units in am - Novolog 15 min before meals - only if you eat a meal: 16 units before b'fast 16 units before lunch 12 units before dinner  Please return in 1.5 months.  - Strongly advised her to start checking sugars at different times of the day - check 4x a day, rotating checks - discussed about CBG targets for treatment: 80-130 mg/dL before meals and <180 mg/dL after meals; target HbA1c <7%. - given foot care handout and explained the principles  - given instructions for hypoglycemia management "15-15 rule"  - advised for yearly eye exams  - Return to clinic in 1.5 mo    Philemon Kingdom, MD PhD [facility] Endocrinology

## 2021-09-18 ENCOUNTER — Encounter: Payer: Self-pay | Admitting: Nurse Practitioner

## 2021-09-23 ENCOUNTER — Ambulatory Visit: Payer: Federal, State, Local not specified - PPO | Admitting: Orthopaedic Surgery

## 2021-09-26 ENCOUNTER — Encounter: Payer: Self-pay | Admitting: Nurse Practitioner

## 2021-09-26 ENCOUNTER — Other Ambulatory Visit: Payer: Self-pay

## 2021-09-26 ENCOUNTER — Ambulatory Visit: Payer: Federal, State, Local not specified - PPO | Admitting: Nurse Practitioner

## 2021-09-26 VITALS — BP 134/66 | HR 80 | Temp 98.2°F | Ht 64.0 in | Wt 256.8 lb

## 2021-09-26 DIAGNOSIS — Z1231 Encounter for screening mammogram for malignant neoplasm of breast: Secondary | ICD-10-CM

## 2021-09-26 DIAGNOSIS — E119 Type 2 diabetes mellitus without complications: Secondary | ICD-10-CM | POA: Diagnosis not present

## 2021-09-26 DIAGNOSIS — Z794 Long term (current) use of insulin: Secondary | ICD-10-CM

## 2021-09-26 DIAGNOSIS — F331 Major depressive disorder, recurrent, moderate: Secondary | ICD-10-CM | POA: Diagnosis not present

## 2021-09-26 MED ORDER — ESCITALOPRAM OXALATE 5 MG PO TABS: 5.0000 mg | ORAL_TABLET | Freq: Every day | ORAL | 0 refills | Status: DC

## 2021-09-26 MED ORDER — NOVOLOG FLEXPEN 100 UNIT/ML ~~LOC~~ SOPN: 12.0000 [IU] | PEN_INJECTOR | Freq: Three times a day (TID) | SUBCUTANEOUS | 3 refills | Status: DC

## 2021-09-26 NOTE — Progress Notes (Signed)
Subjective:  Patient ID: Regina Mckay, female    DOB: June 02, 1951  Age: 71 y.o. MRN: [id number]  CC:  Chief Complaint  Patient presents with   Follow-up    No new concerns.      HPI  This patient arrives today for the above.  Type 2 diabetes: Patient was recently seen by Dr. Cruzita Lederer.  Medication regimen for diabetes was recently changed.  She is on Basaglar 70 units every morning and NovoLog 12 units with each meal.  She also was started on metformin and continues on Ozempic.  She tells me that during the day her blood sugars seem to be near treatment goal, however at night she is having hypoglycemic events.  She tells me it is dropping into the 60s and she is up every 2 hours or so having to eat something to bring her blood sugar up.  Otherwise, she is feeling well.  Last EGFR was 63.  Depression: She is experiencing depression and anxiety.  PHQ 9 score today is 18.  She would like to consider trying medication for treatment of this.  Of note, last office visit she had mammogram ordered and it does not appear that this was scheduled.  This is for breast cancer screening.  Past Medical History:  Diagnosis Date   Diabetes mellitus without complication ([place])    Hypertension       Family History  Problem Relation Age of Onset   Alcohol abuse Mother    Arthritis Mother    Stroke Mother    Alcohol abuse Father    Arthritis Father    Alcohol abuse Sister    Diabetes Sister    Alcohol abuse Sister    Arthritis Sister    Alcohol abuse Brother    Alcohol abuse Brother    Alcohol abuse Brother    Diabetes Brother    Alcohol abuse Daughter     Social History   Social History Narrative   Not on file   Social History   Tobacco Use   Smoking status: Unknown   Smokeless tobacco: Former    Quit date: 1970  Substance Use Topics   Alcohol use: Not Currently     Current Meds  Medication Sig   acetaminophen (TYLENOL) 500 MG tablet Take by mouth.    amLODipine (NORVASC) 10 MG tablet Take 10 mg by mouth daily.   bismuth subsalicylate (PEPTO BISMOL) 262 MG chewable tablet Chew 524 mg by mouth as needed.   Continuous Blood Gluc Sensor (FREESTYLE LIBRE 14 DAY SENSOR) MISC See admin instructions.   Dextrose, Diabetic Use, (GLUCOSE PO) Take by mouth. Patient uses Glucose Tablets with 15 grams of carbohydrates.   escitalopram (LEXAPRO) 5 MG tablet Take 1 tablet (5 mg total) by mouth daily.   hydrochlorothiazide (MICROZIDE) 12.5 MG capsule Take 1 capsule (12.5 mg total) by mouth daily.   HYDROcodone-acetaminophen (NORCO) 7.5-325 MG tablet Take 1-2 tablets by mouth every 6 (six) hours as needed.   Insulin Glargine (BASAGLAR KWIKPEN) 100 UNIT/ML Inject 70 Units into the skin daily.   Insulin Pen Needle 32G X 4 MM MISC Use 4x a day   levETIRAcetam (KEPPRA) 500 MG tablet Take 1 tablet (500 mg total) by mouth 2 (two) times daily.   lidocaine (LIDODERM) 5 % PLACE 1 PATCH ONTO THE SKIN DAILY. REMOVE& DISCARD PATCH WITHIN 12 HOURS   lisinopril (PRINIVIL,ZESTRIL) 20 MG tablet Take 20 mg by mouth daily.   metFORMIN (GLUCOPHAGE) 500 MG tablet Take 1  tablet (500 mg total) by mouth 2 (two) times daily with a meal.   polyethylene glycol (MIRALAX / GLYCOLAX) 17 g packet Take 17 g by mouth daily as needed for mild constipation.   pravastatin (PRAVACHOL) 40 MG tablet Take 40 mg by mouth daily.   Semaglutide,0.25 or 0.5MG/DOS, (OZEMPIC, 0.25 OR 0.5 MG/DOSE,) 2 MG/1.5ML SOPN Inject into the skin.   [DISCONTINUED] insulin aspart (NOVOLOG FLEXPEN) 100 UNIT/ML FlexPen Inject 16-20 Units into the skin 3 (three) times daily with meals.    ROS:  See HPI   Objective:   Today's Vitals: BP 134/66 (BP Location: Right Arm, Patient Position: Sitting, Cuff Size: Large)    Pulse 80    Temp 98.2 F (36.8 C) (Oral)    Ht 5' 4"  (1.626 m)    Wt 256 lb 12.8 oz (116.5 kg)    SpO2 98%    BMI 44.08 kg/m  Vitals with BMI 09/26/2021 09/13/2021 09/12/2021  Height 5' 4"  5' 4"  5' 4"    Weight 256 lbs 13 oz 248 lbs 3 oz 246 lbs  BMI 44.06 43.15 40.08  Systolic 676 195 093  Diastolic 66 68 78  Pulse 80 101 105     Physical Exam Vitals reviewed.  Constitutional:      General: She is not in acute distress.    Appearance: Normal appearance.  HENT:     Head: Normocephalic and atraumatic.  Neck:     Vascular: No carotid bruit.  Cardiovascular:     Rate and Rhythm: Normal rate and regular rhythm.     Pulses: Normal pulses.     Heart sounds: Normal heart sounds.  Pulmonary:     Effort: Pulmonary effort is normal.     Breath sounds: Normal breath sounds.  Skin:    General: Skin is warm and dry.  Neurological:     General: No focal deficit present.     Mental Status: She is alert and oriented to person, place, and time.  Psychiatric:        Mood and Affect: Mood normal.        Behavior: Behavior normal.        Judgment: Judgment normal.       PHQ9 SCORE ONLY 09/26/2021 09/26/2021  PHQ-9 Total Score 18 6     Assessment and Plan   1. Moderate episode of recurrent major depressive disorder ([place])   2. Encounter for screening mammogram for malignant neoplasm of breast   3. Type 2 diabetes mellitus without complication, with long-term current use of insulin ([place])      Plan: 1.  Per shared decision making patient will trial low-dose of Lexapro.  She will return the office in 4 to 6 weeks to discuss how she is tolerating the medication.  She was warned of black box warning of suicidal ideation and if this were to occur to stop the medication and call the office.  She reports her understanding. 2.  We will reorder screening mammogram today. 3.  I will reach out to Dr. Cruzita Lederer and suggest we reduce her Basaglar to 65 units.  If Dr. Cruzita Lederer is agreeable we will let patient know.  She is aware that she needs to be watching out for MyChart message and she is agreeable to this.     Tests ordered Orders Placed This Encounter  Procedures   MM DIGITAL SCREENING  BILATERAL      Meds ordered this encounter  Medications   insulin aspart (NOVOLOG FLEXPEN) 100 UNIT/ML FlexPen  Sig: Inject 12-20 Units into the skin 3 (three) times daily with meals.    Dispense:  45 mL    Refill:  3    Order Specific Question:   Supervising Provider    Answer:   BURNS, Claudina Lick [[id number]]   escitalopram (LEXAPRO) 5 MG tablet    Sig: Take 1 tablet (5 mg total) by mouth daily.    Dispense:  90 tablet    Refill:  0    Order Specific Question:   Supervising Provider    Answer:   Binnie Rail [id number]    Patient to follow-up in 4 to 6 weeks or sooner as needed.  Ailene Ards, NP

## 2021-09-26 NOTE — Patient Instructions (Signed)
IF YOU EXPERIENCE SUICIDAL THOUGHTS, STOP THE MEDICATION AND NOTIFY [facility]'S OFFICE ([facility])  Escitalopram Tablets What is this medication? ESCITALOPRAM (es sye TAL oh pram) treats depression and anxiety. It increases the amount of serotonin in the brain, a hormone that helps regulate mood. It belongs to a group of medications called SSRIs. This medicine may be used for other purposes; ask your health care provider or pharmacist if you have questions. COMMON BRAND NAME(S): Lexapro What should I tell my care team before I take this medication? They need to know if you have any of these conditions: Bipolar disorder or a family history of bipolar disorder Diabetes Glaucoma Heart disease Kidney or liver disease Receiving electroconvulsive therapy Seizures Suicidal thoughts, plans, or attempt by you or a family member An unusual or allergic reaction to escitalopram, the related medication citalopram, other medications, foods, dyes, or preservatives Pregnant or trying to become pregnant Breast-feeding How should I use this medication? Take this medication by mouth with a glass of water. Follow the directions on the prescription label. You can take it with or without food. If it upsets your stomach, take it with food. Take your medication at regular intervals. Do not take it more often than directed. Do not stop taking this medication suddenly except upon the advice of your care team. Stopping this medication too quickly may cause serious side effects or your condition may worsen. A special MedGuide will be given to you by the pharmacist with each prescription and refill. Be sure to read this information carefully each time. Talk to your care team regarding the use of this medication in children. Special care may be needed. Overdosage: If you think you have taken too much of this medicine contact a poison control center or emergency room at once. NOTE: This medicine is only for you. Do  not share this medicine with others. What if I miss a dose? If you miss a dose, take it as soon as you can. If it is almost time for your next dose, take only that dose. Do not take double or extra doses. What may interact with this medication? Do not take this medication with any of the following: Certain medications for fungal infections like fluconazole, itraconazole, ketoconazole, posaconazole, voriconazole Cisapride Citalopram Dronedarone Linezolid MAOIs like Carbex, Eldepryl, Marplan, Nardil, and Parnate Methylene blue (injected into a vein) Pimozide Thioridazine This medication may also interact with the following: Alcohol Amphetamines Aspirin and aspirin-like medications Carbamazepine Certain medications for depression, anxiety, or psychotic disturbances Certain medications for migraine headache like almotriptan, eletriptan, frovatriptan, naratriptan, rizatriptan, sumatriptan, zolmitriptan Certain medications for sleep Certain medications that treat or prevent blood clots like warfarin, enoxaparin, dalteparin Cimetidine Diuretics Dofetilide Fentanyl Furazolidone Isoniazid Lithium Metoprolol NSAIDs, medications for pain and inflammation, like ibuprofen or naproxen Other medications that prolong the QT interval (cause an abnormal heart rhythm) Procarbazine Rasagiline Supplements like St. John's wort, kava kava, valerian Tramadol Tryptophan Ziprasidone This list may not describe all possible interactions. Give your health care provider a list of all the medicines, herbs, non-prescription drugs, or dietary supplements you use. Also tell them if you smoke, drink alcohol, or use illegal drugs. Some items may interact with your medicine. What should I watch for while using this medication? Tell your care team if your symptoms do not get better or if they get worse. Visit your care team for regular checks on your progress. Because it may take several weeks to see the full  effects of this medication, it is  important to continue your treatment as prescribed by your care team. Watch for new or worsening thoughts of suicide or depression. This includes sudden changes in mood, behaviors, or thoughts. These changes can happen at any time but are more common in the beginning of treatment or after a change in dose. Call your care team right away if you experience these thoughts or worsening depression. Manic episodes may happen in patients with bipolar disorder who take this medication. Watch for changes in feelings or behaviors such as feeling anxious, nervous, agitated, panicky, irritable, hostile, aggressive, impulsive, severely restless, overly excited and hyperactive, or trouble sleeping. These symptoms can happen at any time but are more common in the beginning of treatment or after a change in dose. Call your care team right away if you notice any of these symptoms. You may get drowsy or dizzy. Do not drive, use machinery, or do anything that needs mental alertness until you know how this medication affects you. Do not stand or sit up quickly, especially if you are an older patient. This reduces the risk of dizzy or fainting spells. Alcohol may interfere with the effect of this medication. Avoid alcoholic drinks. Your mouth may get dry. Chewing sugarless gum or sucking hard candy, and drinking plenty of water may help. Contact your care team if the problem does not go away or is severe. What side effects may I notice from receiving this medication? Side effects that you should report to your care team as soon as possible: Allergic reactions--skin rash, itching, hives, swelling of the face, lips, tongue, or throat Bleeding--bloody or black, tar-like stools, red or dark brown urine, vomiting blood or brown material that looks like coffee grounds, small, red or purple spots on skin, unusual bleeding or bruising Heart rhythm changes--fast or irregular heartbeat, dizziness,  feeling faint or lightheaded, chest pain, trouble breathing Low sodium level--muscle weakness, fatigue, dizziness, headache, confusion Serotonin syndrome--irritability, confusion, fast or irregular heartbeat, muscle stiffness, twitching muscles, sweating, high fever, seizure, chills, vomiting, diarrhea Sudden eye pain or change in vision such as blurry vision, seeing halos around lights, vision loss Thoughts of suicide or self-harm, worsening mood, feelings of depression Side effects that usually do not require medical attention (report to your care team if they continue or are bothersome): Change in sex drive or performance Diarrhea Excessive sweating Nausea Tremors or shaking Upset stomach This list may not describe all possible side effects. Call your doctor for medical advice about side effects. You may report side effects to FDA at 1-800-FDA-1088. Where should I keep my medication? Keep out of reach of children and pets. Store at room temperature between 15 and 30 degrees C (59 and 86 degrees F). Throw away any unused medication after the expiration date. NOTE: This sheet is a summary. It may not cover all possible information. If you have questions about this medicine, talk to your doctor, pharmacist, or health care provider.  2022 [employer] Standard (2020-07-09 00:00:00)

## 2021-10-02 ENCOUNTER — Ambulatory Visit: Payer: Federal, State, Local not specified - PPO | Admitting: Neurology

## 2021-10-02 ENCOUNTER — Encounter: Payer: Self-pay | Admitting: Neurology

## 2021-10-02 VITALS — BP 139/80 | HR 94 | Ht 64.0 in | Wt 257.0 lb

## 2021-10-02 DIAGNOSIS — D329 Benign neoplasm of meninges, unspecified: Secondary | ICD-10-CM | POA: Diagnosis not present

## 2021-10-02 DIAGNOSIS — Z5181 Encounter for therapeutic drug level monitoring: Secondary | ICD-10-CM | POA: Diagnosis not present

## 2021-10-02 DIAGNOSIS — G40909 Epilepsy, unspecified, not intractable, without status epilepticus: Secondary | ICD-10-CM | POA: Diagnosis not present

## 2021-10-02 DIAGNOSIS — H4920 Sixth [abducent] nerve palsy, unspecified eye: Secondary | ICD-10-CM | POA: Insufficient documentation

## 2021-10-02 NOTE — Progress Notes (Signed)
[facility]  PATIENT: Regina Mckay DOB: 08-09-1950  REQUESTING CLINICIAN: Ailene Ards, NP HISTORY FROM: Patient  REASON FOR VISIT: Seizure/ Meningioma    HISTORICAL  CHIEF COMPLAINT:  Chief Complaint  Patient presents with   New Patient (Initial Visit)    Rm 17. Alone. NP Internal referral for hx of meningioma and vocal/absent seizures.    HISTORY OF PRESENT ILLNESS:  This is a 71 year old woman with past medical history of diabetes, hypertension, depression, seizure in the setting of meningioma who is presenting to establish care.  Patient reports last year in March she presented to the hospital for seizures.  She said that she could not speak, felt her heart racing but she was aware of the entire episode.  She reports 2 weeks prior to presentation in the hospital she started having those episodes that she first thought that she was choking.  During her work-up in the ED she was found to have a left temporal poll meningioma.  She did have an EEG at that time which showed left temporal slowing.  She she was told that her symptoms were likely left temporal seizures.  She was started on Keppra 1000 mg twice daily and had meningioma removed on March 10.  Since that she has been doing well, denies any seizure-like activity, in December her neurosurgeon decrease the Keppra to 500 mg twice daily, repeat MRI did not show recurrence of the meningioma and no seizure-like activity.   Patient is compliant with the medication, denies any side effect   Handedness: Right handed   Onset: 2022, last seizure March 2022  Seizure Type: Focal aware   Current frequency: Last seizure was in March 2022   Any injuries from seizures: None  Seizure risk factors: Meningioma  Previous ASMs: Levetiracetam  Currenty ASMs: Levetiracetam 500 mg twice daily  ASMs side effects: Denies  Brain Images: Left middle cranial fossa meningioma  Previous EEGs: Left temporal  slowing   OTHER MEDICAL CONDITIONS: Meningioma, Seizures, DM, HTN, Depression   REVIEW OF SYSTEMS: Full 14 system review of systems performed and negative with exception of: as noted in the HPI   ALLERGIES: Allergies  Allergen Reactions   Amoxicillin Shortness Of Breath   Penicillins Shortness Of Breath    Did it involve swelling of the face/tongue/throat, SOB, or low BP? Yes Did it involve sudden or severe rash/hives, skin peeling, or any reaction on the inside of your mouth or nose? No Did you need to seek medical attention at a hospital or doctor's office? Yes When did it last happen?       If all above answers are NO, may proceed with cephalosporin use.     HOME MEDICATIONS: Outpatient Medications Prior to Visit  Medication Sig Dispense Refill   acetaminophen (TYLENOL) 500 MG tablet Take by mouth.     amLODipine (NORVASC) 10 MG tablet Take 10 mg by mouth daily.     bismuth subsalicylate (PEPTO BISMOL) 262 MG chewable tablet Chew 524 mg by mouth as needed.     Continuous Blood Gluc Sensor (FREESTYLE LIBRE 14 DAY SENSOR) MISC See admin instructions.     Dextrose, Diabetic Use, (GLUCOSE PO) Take by mouth. Patient uses Glucose Tablets with 15 grams of carbohydrates.     escitalopram (LEXAPRO) 5 MG tablet Take 1 tablet (5 mg total) by mouth daily. 90 tablet 0   hydrochlorothiazide (MICROZIDE) 12.5 MG capsule Take 1 capsule (12.5 mg total) by mouth daily. 30 capsule 0   HYDROcodone-acetaminophen (  NORCO) 7.5-325 MG tablet Take 1-2 tablets by mouth every 6 (six) hours as needed.     insulin aspart (NOVOLOG FLEXPEN) 100 UNIT/ML FlexPen Inject 12-20 Units into the skin 3 (three) times daily with meals. 45 mL 3   Insulin Glargine (BASAGLAR KWIKPEN) 100 UNIT/ML Inject 70 Units into the skin daily. 45 mL 3   Insulin Pen Needle 32G X 4 MM MISC Use 4x a day 300 each 3   levETIRAcetam (KEPPRA) 500 MG tablet Take 1 tablet (500 mg total) by mouth 2 (two) times daily. 60 tablet 1   lidocaine  (LIDODERM) 5 % PLACE 1 PATCH ONTO THE SKIN DAILY. REMOVE& DISCARD PATCH WITHIN 12 HOURS 90 patch 0   lisinopril (PRINIVIL,ZESTRIL) 20 MG tablet Take 20 mg by mouth daily.     metFORMIN (GLUCOPHAGE) 500 MG tablet Take 1 tablet (500 mg total) by mouth 2 (two) times daily with a meal. 180 tablet 3   Multiple Vitamins-Minerals (ZINC PO) Take 1 tablet by mouth daily.     polyethylene glycol (MIRALAX / GLYCOLAX) 17 g packet Take 17 g by mouth daily as needed for mild constipation. 14 each 0   pravastatin (PRAVACHOL) 40 MG tablet Take 40 mg by mouth daily.     Semaglutide,0.25 or 0.5MG /DOS, (OZEMPIC, 0.25 OR 0.5 MG/DOSE,) 2 MG/1.5ML SOPN Inject into the skin.     No facility-administered medications prior to visit.    PAST MEDICAL HISTORY: Past Medical History:  Diagnosis Date   Diabetes mellitus without complication ([place])    Hypertension     PAST SURGICAL HISTORY: Past Surgical History:  Procedure Laterality Date   APPLICATION OF CRANIAL NAVIGATION N/A 10/11/2020   Procedure: APPLICATION OF CRANIAL NAVIGATION;  Surgeon: [name], MD;  Location: [facility];  Service: Neurosurgery;  Laterality: N/A;   CRANIOTOMY N/A 10/11/2020   Procedure: CRANIOTOMY FOR RESECTION OF MENINGIOMA;  Surgeon: [name], MD;  Location: [facility];  Service: Neurosurgery;  Laterality: N/A;   REPLACEMENT TOTAL KNEE     TONSILLECTOMY      FAMILY HISTORY: Family History  Problem Relation Age of Onset   Alcohol abuse Mother    Arthritis Mother    Stroke Mother    Alcohol abuse Father    Arthritis Father    Alcohol abuse Sister    Diabetes Sister    Alcohol abuse Sister    Arthritis Sister    Alcohol abuse Brother    Alcohol abuse Brother    Alcohol abuse Brother    Diabetes Brother    Alcohol abuse Daughter     SOCIAL HISTORY: Social History   Socioeconomic History   Marital status: Divorced    Spouse name: Not on file   Number of children: 1   Years of education: Not on file   Highest  education level: Not on file  Occupational History   Occupation: retired   Occupation: Retired from [employer] Use   Smoking status: Unknown   Smokeless tobacco: Former    Quit date: 1970  Substance and Sexual Activity   Alcohol use: Not Currently   Drug use: No   Sexual activity: Not Currently  Other Topics Concern   Not on file  Social History Narrative   Not on file   Social Determinants of Health   Financial Resource Strain: Not on file  Food Insecurity: Not on file  Transportation Needs: Not on file  Physical Activity: Not on file  Stress: Not on file  Social Connections:  Not on file  Intimate Partner Violence: Not on file    PHYSICAL EXAM  GENERAL EXAM/CONSTITUTIONAL: Vitals:  Vitals:   10/02/21 1458  BP: 139/80  Pulse: 94  Weight: 257 lb (116.6 kg)  Height: 5\' 4"  (1.626 m)   Body mass index is 44.11 kg/m. Wt Readings from Last 3 Encounters:  10/02/21 257 lb (116.6 kg)  09/26/21 256 lb 12.8 oz (116.5 kg)  09/13/21 248 lb 3.2 oz (112.6 kg)   Patient is in no distress; well developed, nourished and groomed; neck is supple  CARDIOVASCULAR: Examination of carotid arteries is normal; no carotid bruits Regular rate and rhythm, no murmurs Examination of peripheral vascular system by observation and palpation is normal  EYES: Pupils round and reactive to light, Visual fields full to confrontation, Extraocular movements intacts,  No results found.  MUSCULOSKELETAL: Gait, strength, tone, movements noted in Neurologic exam below  NEUROLOGIC: MENTAL STATUS:  No flowsheet data found. awake, alert, oriented to person, place and time recent and remote memory intact normal attention and concentration language fluent, comprehension intact, naming intact fund of knowledge appropriate  CRANIAL NERVE:  2nd, 3rd, 4th, 6th - pupils equal and reactive to light, visual fields full to confrontation, extraocular muscles intact, no nystagmus 5th - facial sensation  symmetric 7th - facial strength symmetric 8th - hearing intact 9th - palate elevates symmetrically, uvula midline 11th - shoulder shrug symmetric 12th - tongue protrusion midline  MOTOR:  normal bulk and tone, full strength in the BUE, BLE  SENSORY:  normal and symmetric to light touch, pinprick, temperature, vibration  COORDINATION:  finger-nose-finger, fine finger movements normal  REFLEXES:  deep tendon reflexes present and symmetric  GAIT/STATION:  normal   DIAGNOSTIC DATA (LABS, IMAGING, TESTING) - I reviewed patient records, labs, notes, testing and imaging myself where available.  Lab Results  Component Value Date   WBC 7.2 12/22/2020   HGB 15.2 (H) 12/22/2020   HCT 45.2 12/22/2020   MCV 85.1 12/22/2020   PLT 206 12/22/2020      Component Value Date/Time   NA 134 (L) 09/13/2021 0955   K 4.1 09/13/2021 0955   CL 96 09/13/2021 0955   CO2 31 09/13/2021 0955   GLUCOSE 236 (H) 09/13/2021 0955   BUN 18 09/13/2021 0955   CREATININE 0.90 09/13/2021 0955   CALCIUM 9.6 09/13/2021 0955   PROT 7.4 09/13/2021 0955   ALBUMIN 4.1 09/13/2021 0955   AST 19 09/13/2021 0955   ALT 16 09/13/2021 0955   ALKPHOS 52 09/13/2021 0955   BILITOT 1.1 09/13/2021 0955   GFRNONAA >60 12/22/2020 0300   GFRAA >60 05/17/2019 0548   No results found for: CHOL, HDL, LDLCALC, LDLDIRECT, TRIG Lab Results  Component Value Date   HGBA1C 8.1 (A) 09/13/2021   No results found for: [id number] Lab Results  Component Value Date   TSH 2.75 09/13/2021    MRI Brain 10/10/20 Meningioma in the left middle cranial fossa extending to the tentorium and cavernous sinus. This is under the left temporal lobe. No significant brain edema   MRI Brain 07/09/2021 1. Continued satisfactory postoperative appearance of the brain. Attention on follow-up directed to residual dural thickening along the floor of the left middle cranial fossa inseparable from [place]. 2. No new intracranial  abnormality   Routine EEG 10/10/20 This study is suggestive of nonspecific cortical dysfunction in left temporal region.  No seizures or epileptiform discharges were seen throughout the recording.   I personally reviewed brain Images and previous  EEG reports.   ASSESSMENT AND PLAN  71 y.o. year old female  with history of meningioma s/p resection in March 2022, seizures, hypertension hyperlipidemia diabetes mellitus who presenting to establish care for her seizures and meningioma.  Patient had a seizure in the setting of meningioma which was resected back in March 2022, since then she has been doing well, no recurrence of the tumor and no seizures.  Her Keppra has been decreased to 500 mg twice daily and she is tolerating it well, denies any side effect from the medication and again denies any seizure-like activity.  I have told the patient that her seizures were symptomatic seizures in the setting of meningioma, that I will repeat the EEG and based on the results we will discuss if it is reasonable to come off medication.  She is comfortable with plan, I will see her in 6 months for follow-up.   1. Seizure disorder ([place])   2. Meningioma [facility])     Patient Instructions  Continue with Keppra 500 mg twice daily We will check a Keppra and vitamin D level today Routine EEG, I will contact you to go over the result.  We will discuss if reasonable to come off medication. Follow-up in 65-month   Per [facility] statutes, patients with seizures are not allowed to drive until they have been seizure-free for six months.  Other recommendations include using caution when using heavy equipment or power tools. Avoid working on ladders or at heights. Take showers instead of baths.  Do not swim alone.  Ensure the water temperature is not too high on the home water heater. Do not go swimming alone. Do not lock yourself in a room alone (i.e. bathroom). When caring for infants or small children, sit down  when holding, feeding, or changing them to minimize risk of injury to the child in the event you have a seizure. Maintain good sleep hygiene. Avoid alcohol.  Also recommend adequate sleep, hydration, good diet and minimize stress.   During the Seizure  - First, ensure adequate ventilation and place patients on the floor on their left side  Loosen clothing around the neck and ensure the airway is patent. If the patient is clenching the teeth, do not force the mouth open with any object as this can cause severe damage - Remove all items from the surrounding that can be hazardous. The patient may be oblivious to what's happening and may not even know what he or she is doing. If the patient is confused and wandering, either gently guide him/her away and block access to outside areas - Reassure the individual and be comforting - Call 911. In most cases, the seizure ends before EMS arrives. However, there are cases when seizures may last over 3 to 5 minutes. Or the individual may have developed breathing difficulties or severe injuries. If a pregnant patient or a person with diabetes develops a seizure, it is prudent to call an ambulance. - Finally, if the patient does not regain full consciousness, then call EMS. Most patients will remain confused for about 45 to 90 minutes after a seizure, so you must use judgment in calling for help. - Avoid restraints but make sure the patient is in a bed with padded side rails - Place the individual in a lateral position with the neck slightly flexed; this will help the saliva drain from the mouth and prevent the tongue from falling backward - Remove all nearby furniture and other hazards from the area -  Provide verbal assurance as the individual is regaining consciousness - Provide the patient with privacy if possible - Call for help and start treatment as ordered by the caregiver   After the Seizure (Postictal Stage)  After a seizure, most patients experience  confusion, fatigue, muscle pain and/or a headache. Thus, one should permit the individual to sleep. For the next few days, reassurance is essential. Being calm and helping reorient the person is also of importance.  Most seizures are painless and end spontaneously. Seizures are not harmful to others but can lead to complications such as stress on the lungs, brain and the heart. Individuals with prior lung problems may develop labored breathing and respiratory distress.     Orders Placed This Encounter  Procedures   Levetiracetam level   Vitamin D, 25-hydroxy   Adult EEG prolonged 41-60 minutes    No orders of the defined types were placed in this encounter.   Return in about 6 months (around 04/04/2022).   I have spent a total of 65 minutes dedicated to this patient today, preparing to see patient, performing a medically appropriate examination and evaluation, ordering tests and/or medications and procedures, and counseling and educating the patient/family/caregiver; independently interpreting result and communicating results to the family/patient/caregiver; and documenting clinical information in the electronic medical record.   Alric Ran, MD 10/02/2021, 4:21 PM  [facility] [address] [place], [place] [address] [phone]

## 2021-10-02 NOTE — Patient Instructions (Signed)
Continue with Keppra 500 mg twice daily ?We will check a Keppra and vitamin D level today ?Routine EEG, I will contact you to go over the result.  We will discuss if reasonable to come off medication. ?Follow-up in 29-month ?

## 2021-10-04 ENCOUNTER — Other Ambulatory Visit: Payer: Self-pay | Admitting: Neurology

## 2021-10-04 ENCOUNTER — Encounter: Payer: Self-pay | Admitting: Neurology

## 2021-10-04 LAB — VITAMIN D 25 HYDROXY (VIT D DEFICIENCY, FRACTURES): Vit D, 25-Hydroxy: 15.2 ng/mL — ABNORMAL LOW (ref 30.0–100.0)

## 2021-10-04 LAB — LEVETIRACETAM LEVEL: Levetiracetam Lvl: 26.6 ug/mL (ref 10.0–40.0)

## 2021-10-04 MED ORDER — VITAMIN D (ERGOCALCIFEROL) 1.25 MG (50000 UNIT) PO CAPS: 50000.0000 [IU] | ORAL_CAPSULE | ORAL | 0 refills | Status: DC

## 2021-10-04 NOTE — Addendum Note (Signed)
Addended byAlric Ran on: 10/04/2021 12:27 PM ? ? Modules accepted: Orders ? ?

## 2021-10-15 ENCOUNTER — Ambulatory Visit: Payer: Federal, State, Local not specified - PPO | Admitting: Neurology

## 2021-10-15 DIAGNOSIS — D329 Benign neoplasm of meninges, unspecified: Secondary | ICD-10-CM

## 2021-10-15 DIAGNOSIS — G40909 Epilepsy, unspecified, not intractable, without status epilepticus: Secondary | ICD-10-CM

## 2021-10-16 NOTE — Procedures (Signed)
? ? ?  History: ? ?71 year old woman with meningioma s/p resection and seizure  ? ?EEG classification: Awake and drowsy ? ?Description of the recording: The background rhythms of this recording consists of a fairly well modulated medium amplitude alpha rhythm of 10 Hz that is reactive to eye opening and closure. As the record progresses, the patient appears to remain in the waking state throughout the recording. Photic stimulation was performed, did not show any abnormalities. Hyperventilation was also performed, did not show any abnormalities. Toward the end of the recording, the patient enters the drowsy state with slight symmetric slowing seen. The patient never enters stage II sleep. No abnormal epileptiform discharges seen during this recording. There is presence of breach rhythm in the left temporal region. There was no focal slowing. EKG monitor shows no evidence of cardiac rhythm abnormalities with a heart rate of 72. ? ?Impression: This is a normal EEG recording in the waking and drowsy state. No evidence of interictal epileptiform discharges seen. A normal EEG does not exclude a diagnosis of epilepsy.  ? ? ?Alric Ran, MD ?[facility] ?  ?

## 2021-10-17 ENCOUNTER — Encounter: Payer: Self-pay | Admitting: Neurology

## 2021-10-18 ENCOUNTER — Encounter: Payer: Self-pay | Admitting: Nurse Practitioner

## 2021-10-18 ENCOUNTER — Encounter: Payer: Self-pay | Admitting: Internal Medicine

## 2021-10-18 ENCOUNTER — Other Ambulatory Visit: Payer: Self-pay | Admitting: Nurse Practitioner

## 2021-10-18 DIAGNOSIS — I10 Essential (primary) hypertension: Secondary | ICD-10-CM

## 2021-10-18 DIAGNOSIS — E119 Type 2 diabetes mellitus without complications: Secondary | ICD-10-CM

## 2021-10-18 MED ORDER — OZEMPIC (0.25 OR 0.5 MG/DOSE) 2 MG/1.5ML ~~LOC~~ SOPN: 0.5000 mg | PEN_INJECTOR | SUBCUTANEOUS | 1 refills | Status: DC

## 2021-10-18 MED ORDER — AMLODIPINE BESYLATE 10 MG PO TABS: 10.0000 mg | ORAL_TABLET | Freq: Every day | ORAL | 1 refills | Status: DC

## 2021-10-25 ENCOUNTER — Other Ambulatory Visit: Payer: Self-pay

## 2021-10-25 ENCOUNTER — Encounter: Payer: Self-pay | Admitting: Internal Medicine

## 2021-10-25 ENCOUNTER — Ambulatory Visit: Payer: Federal, State, Local not specified - PPO | Admitting: Internal Medicine

## 2021-10-25 VITALS — BP 120/76 | HR 74 | Ht 64.0 in | Wt 253.4 lb

## 2021-10-25 DIAGNOSIS — E113211 Type 2 diabetes mellitus with mild nonproliferative diabetic retinopathy with macular edema, right eye: Secondary | ICD-10-CM

## 2021-10-25 DIAGNOSIS — E782 Mixed hyperlipidemia: Secondary | ICD-10-CM

## 2021-10-25 DIAGNOSIS — Z6841 Body Mass Index (BMI) 40.0 and over, adult: Secondary | ICD-10-CM | POA: Insufficient documentation

## 2021-10-25 DIAGNOSIS — Z794 Long term (current) use of insulin: Secondary | ICD-10-CM

## 2021-10-25 MED ORDER — FREESTYLE LIBRE 14 DAY SENSOR MISC: 3 refills | Status: DC

## 2021-10-25 MED ORDER — FREESTYLE LIBRE 2 SENSOR MISC: 1.0000 | 3 refills | Status: DC

## 2021-10-25 MED ORDER — FREESTYLE LIBRE 2 READER DEVI: 1.0000 | Freq: Every day | 0 refills | Status: DC

## 2021-10-25 NOTE — Patient Instructions (Addendum)
Please continue: ?- Metformin 500 mg 2x a day with meals ?- Ozempic 0.5 mg weekly ? ?Please decrease: ?- Basaglar 40 units in am (if the sugars remain low after this, decrease Basaglar even further, to 34 units) ?- Novolog 15 min before meals - only if you eat a meal: ?6-10 units before b'fast ?6-10 units before lunch ?6-10 units before dinner ? ?Please return in 2 months. ?

## 2021-10-25 NOTE — Progress Notes (Signed)
Patient ID: Regina Mckay, female   DOB: 01-17-1951, 71 y.o.   MRN: [id number] ? ?This visit occurred during the SARS-CoV-2 public health emergency.  Safety protocols were in place, including screening questions prior to the visit, additional usage of staff PPE, and extensive cleaning of exam room while observing appropriate contact time as indicated for disinfecting solutions.  ? ?HPI: ?Regina Mckay is a 71 y.o.-year-old female, initially referred by Dr. Janie Morning, returning for follow-up for DM2, dx at the end of 1990s, insulin-dependent soon after dx, uncontrolled, with complications (diabetic retinopathy, PN).  She is the sister of [name], who is also my patient.  Last visit 6 weeks ago. ? ?Interim history: ?No increased urination, blurry vision, nausea, chest pain. ?However, at this visit, she tells me that she is not able to sleep at night due to the many low blood sugars. ? ?Reviewed HbA1c: ?Lab Results  ?Component Value Date  ? HGBA1C 8.1 (A) 09/13/2021  ? HGBA1C 13.6 (H) 10/10/2020  ?07/05/2021: HbA1c 7.0%; 1,5 Anhydroglucitol 11.2 (6.9-29.3) ? ?At last visit, she was on: ?- Ozempic 0.5 mg weekly >> decreased appetite, "nothing tastes good" ?- Novolog 70/30: 55-45-30 units before B-L-D. Adds 5-10 units depending on the sugars before meals. ?She was on: Metformin-glyburide 500-5 mg 3x a day, with meals. ?Previously on Basaglar 70 units at bedtime. ? ?We changed to: ?-  >> stopped b/c shaking  ?- Ozempic 0.5 mg weekly ?- Basaglar 70 >> 65 >> 60 units in am ?- Novolog 15 min before meals - only if you eat a meal: ?16 >> 10 units before b'fast ?16 >> 10  units before lunch ?12 >> 10 units before dinner ? ?Pt checks her sugars >4x a day with her Freestyle Libre CGM: ? ? ?Previously: ? ? ?Lowest sugar was 60s >> 40s x1; she has hypoglycemia awareness at 100.  ?Highest sugar was 300s >> <180 ?She was in the emergency room with hyperglycemia in 12/2020. ? ?She had steroid inj's in shoulder: 2  in 04/2021 and 1 in 08/2021. ?She increased fried foods before our visit in 09/2021. ? ?- no CKD, last BUN/creatinine:  ?Lab Results  ?Component Value Date  ? BUN 18 09/13/2021  ? BUN 22 12/22/2020  ? CREATININE 0.90 09/13/2021  ? CREATININE 0.97 12/22/2020  ?On lisinopril 20 mg daily. ? ?- + HL; last set of lipids: ?Lipid Panel   2021-07-05    ?Cholesterol 146   <200  ?Triglycerides 56   <150  ?HDL Cholesterol 55   >39  ?Cholesterol / HDL Ratio 2.65   0.00-4.44  ?Non-HDL Cholesterol 91   <130  ?LDL Cholesterol (Calculation) 80   <130  ? ?No results found for: CHOL, HDL, LDLCALC, LDLDIRECT, TRIG, CHOLHDL ?On pravastatin 40 mg daily. ? 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  ? ?- no numbness and tingling in her feet. ? ?Pt has FH of DM in B and S. ? ?She has a history of HTN, meningioma, s/p resection 10/2020 - h/o seizures. ? ?ROS: ?Constitutional: + weight gain, + weight loss, + fatigue,  + subjective hypothermia ?+ see HPI ? ?Past Medical History:  ?Diagnosis Date  ? Diabetes mellitus without complication ([place])   ? Hypertension   ? ?Past Surgical History:  ?Procedure Laterality Date  ? APPLICATION OF CRANIAL NAVIGATION N/A 10/11/2020  ? Procedure: APPLICATION OF CRANIAL NAVIGATION;  Surgeon: [name], MD;  Location: [facility];  Service: Neurosurgery;  Laterality: N/A;  ? CRANIOTOMY N/A 10/11/2020  ? Procedure: CRANIOTOMY FOR RESECTION OF MENINGIOMA;  Surgeon: [name], MD;  Location: [facility];  Service: Neurosurgery;  Laterality: N/A;  ? REPLACEMENT TOTAL KNEE    ? TONSILLECTOMY    ? ?Social History  ? ?Socioeconomic History  ? Marital status: Divorced  ?  Spouse name: Not on file  ? Number of children: 1  ? Years of education: Not on file  ? Highest education level: Not on file  ?Occupational History  ? Occupation: retired  ? Occupation: Retired from [employer]  ?Tobacco Use  ? Smoking status: Unknown  ? Smokeless tobacco: Former  ?   Quit date: 1970  ?Substance and Sexual Activity  ? Alcohol use: Not Currently  ? Drug use: No  ? Sexual activity: Not Currently  ?Other Topics Concern  ? Not on file  ?Social History Narrative  ? Not on file  ? ?Social Determinants of Health  ? ?Financial Resource Strain: Not on file  ?Food Insecurity: Not on file  ?Transportation Needs: Not on file  ?Physical Activity: Not on file  ?Stress: Not on file  ?Social Connections: Not on file  ?Intimate Partner Violence: Not on file  ? ?Current Outpatient Medications on File Prior to Visit  ?Medication Sig Dispense Refill  ? acetaminophen (TYLENOL) 500 MG tablet Take by mouth.    ? amLODipine (NORVASC) 10 MG tablet Take 1 tablet (10 mg total) by mouth daily. 90 tablet 1  ? bismuth subsalicylate (PEPTO BISMOL) 262 MG chewable tablet Chew 524 mg by mouth as needed.    ? Continuous Blood Gluc Sensor (FREESTYLE LIBRE 14 DAY SENSOR) MISC See admin instructions.    ? Dextrose, Diabetic Use, (GLUCOSE PO) Take by mouth. Patient uses Glucose Tablets with 15 grams of carbohydrates.    ? escitalopram (LEXAPRO) 5 MG tablet Take 1 tablet (5 mg total) by mouth daily. 90 tablet 0  ? hydrochlorothiazide (MICROZIDE) 12.5 MG capsule Take 1 capsule (12.5 mg total) by mouth daily. 30 capsule 0  ? HYDROcodone-acetaminophen (NORCO) 7.5-325 MG tablet Take 1-2 tablets by mouth every 6 (six) hours as needed.    ? insulin aspart (NOVOLOG FLEXPEN) 100 UNIT/ML FlexPen Inject 12-20 Units into the skin 3 (three) times daily with meals. 45 mL 3  ? Insulin Glargine (BASAGLAR KWIKPEN) 100 UNIT/ML Inject 70 Units into the skin daily. 45 mL 3  ? Insulin Pen Needle 32G X 4 MM MISC Use 4x a day 300 each 3  ? levETIRAcetam (KEPPRA) 500 MG tablet Take 1 tablet (500 mg total) by mouth 2 (two) times daily. 60 tablet 1  ? lidocaine (LIDODERM) 5 % PLACE 1 PATCH ONTO THE SKIN DAILY. REMOVE& DISCARD PATCH WITHIN 12 HOURS 90 patch 0  ? lisinopril (PRINIVIL,ZESTRIL) 20 MG tablet Take 20 mg by mouth daily.    ?  metFORMIN (GLUCOPHAGE) 500 MG tablet Take 1 tablet (500 mg total) by mouth 2 (two) times daily with a meal. 180 tablet 3  ? Multiple Vitamins-Minerals (ZINC PO) Take 1 tablet by mouth daily.    ? polyethylene glycol (MIRALAX / GLYCOLAX) 17 g packet Take 17 g by mouth daily as needed for mild constipation. 14 each 0  ? pravastatin (PRAVACHOL) 40 MG tablet Take 40 mg by mouth daily.    ? Semaglutide,0.25 or 0.'5MG'$ /DOS, (OZEMPIC, 0.25 OR 0.5 MG/DOSE,) 2 MG/1.5ML SOPN Inject 0.5 mg into the skin once a week. 4.5 mL 1  ? Vitamin D, Ergocalciferol, (  DRISDOL) 1.25 MG (50000 UNIT) CAPS capsule Take 1 capsule (50,000 Units total) by mouth every 7 (seven) days. 8 capsule 0  ? ?No current facility-administered medications on file prior to visit.  ? ?Allergies  ?Allergen Reactions  ? Amoxicillin Shortness Of Breath  ? Penicillins Shortness Of Breath  ?  Did it involve swelling of the face/tongue/throat, SOB, or low BP? Yes ?Did it involve sudden or severe rash/hives, skin peeling, or any reaction on the inside of your mouth or nose? No ?Did you need to seek medical attention at a hospital or doctor's office? Yes ?When did it last happen?       ?If all above answers are ?NO?, may proceed with cephalosporin use. ?  ? ?Family History  ?Problem Relation Age of Onset  ? Alcohol abuse Mother   ? Arthritis Mother   ? Stroke Mother   ? Alcohol abuse Father   ? Arthritis Father   ? Alcohol abuse Sister   ? Diabetes Sister   ? Alcohol abuse Sister   ? Arthritis Sister   ? Alcohol abuse Brother   ? Alcohol abuse Brother   ? Alcohol abuse Brother   ? Diabetes Brother   ? Alcohol abuse Daughter   ? ?PE: ?BP 120/76 (BP Location: Left Arm, Patient Position: Sitting, Cuff Size: Normal)   Pulse 74   Ht '5\' 4"'$  (1.626 m)   Wt 253 lb 6.4 oz (114.9 kg)   SpO2 96%   BMI 43.50 kg/m?  ?Wt Readings from Last 3 Encounters:  ?10/25/21 253 lb 6.4 oz (114.9 kg)  ?10/02/21 257 lb (116.6 kg)  ?09/26/21 256 lb 12.8 oz (116.5 kg)  ? ?Constitutional:  overweight, in NAD ?Eyes: PERRLA, EOMI, no exophthalmos ?ENT: moist mucous membranes, no thyromegaly, no cervical lymphadenopathy ?Cardiovascular: RRR, No MRG ?Respiratory: CTA B ?Musculoskeletal: no deformities, strength

## 2021-10-31 ENCOUNTER — Ambulatory Visit
Admission: RE | Admit: 2021-10-31 | Discharge: 2021-10-31 | Disposition: A | Discharge status: Home / Self Care | Payer: Federal, State, Local not specified - PPO | Source: Ambulatory Visit | Attending: Nurse Practitioner | Admitting: Nurse Practitioner

## 2021-10-31 DIAGNOSIS — Z1231 Encounter for screening mammogram for malignant neoplasm of breast: Secondary | ICD-10-CM

## 2021-11-01 ENCOUNTER — Ambulatory Visit: Payer: Federal, State, Local not specified - PPO | Admitting: Nurse Practitioner

## 2021-11-01 VITALS — BP 126/78 | HR 81 | Temp 98.5°F | Ht 64.0 in | Wt 256.0 lb

## 2021-11-01 DIAGNOSIS — F331 Major depressive disorder, recurrent, moderate: Secondary | ICD-10-CM | POA: Diagnosis not present

## 2021-11-01 DIAGNOSIS — E113211 Type 2 diabetes mellitus with mild nonproliferative diabetic retinopathy with macular edema, right eye: Secondary | ICD-10-CM | POA: Diagnosis not present

## 2021-11-01 DIAGNOSIS — Z794 Long term (current) use of insulin: Secondary | ICD-10-CM

## 2021-11-01 MED ORDER — BASAGLAR KWIKPEN 100 UNIT/ML ~~LOC~~ SOPN: 40.0000 [IU] | PEN_INJECTOR | Freq: Every day | SUBCUTANEOUS | 3 refills | Status: DC

## 2021-11-01 NOTE — Assessment & Plan Note (Signed)
Chronic, improved.  We had discussion regarding risk versus benefit of adjusting dose of Lexapro.  For now she would like to stay on her current dose.  She will continue Lexapro 5 mg by mouth daily. ?

## 2021-11-01 NOTE — Progress Notes (Signed)
? ? ? ?Subjective:  ?Patient ID: Regina Mckay, female    DOB: [birth year]  Age: 71 y.o. MRN: [id number] ? ?CC:  ?Chief Complaint  ?Patient presents with  ? Depression  ? Diabetes  ?  ? ? ?HPI  ?This patient arrives today for the above. ? ?Depression: Lexapro 5 mg daily prescribed at last office visit.  She is tolerating medication well.  She reports that her anxiety is slightly improved and she also feels a little bit less depressed.  She denies suicidal ideation, however feels that if she were to die she would be okay with that.  She verbalizes that she has no plan or intent to harm herself.  She does continue to stay in her bedroom majority of the day and does not have much motivation to leave her home or participate in activities. ? ?Diabetes: She is following with Dr. Cruzita Lederer.  Basaglar dose has been reduced to 40 units daily.  She reports that she still has frequent hypoglycemic episodes but they are not occurring as often or as severely as they were when I saw her at her last office visit.  She is actively tapering her insulin based on instructions from Dr. Cruzita Lederer. ? ?Past Medical History:  ?Diagnosis Date  ? Diabetes mellitus without complication ([place])   ? Hypertension   ? ? ? ? ?Family History  ?Problem Relation Age of Onset  ? Alcohol abuse Mother   ? Arthritis Mother   ? Stroke Mother   ? Alcohol abuse Father   ? Arthritis Father   ? Alcohol abuse Sister   ? Diabetes Sister   ? Alcohol abuse Sister   ? Arthritis Sister   ? Alcohol abuse Brother   ? Alcohol abuse Brother   ? Alcohol abuse Brother   ? Diabetes Brother   ? Alcohol abuse Daughter   ? ? ?Social History  ? ?Social History Narrative  ? Not on file  ? ?Social History  ? ?Tobacco Use  ? Smoking status: Unknown  ? Smokeless tobacco: Former  ?  Quit date: 1970  ?Substance Use Topics  ? Alcohol use: Not Currently  ? ? ? ?Current Meds  ?Medication Sig  ? acetaminophen (TYLENOL) 500 MG tablet Take by mouth.  ? amLODipine (NORVASC) 10 MG tablet Take 1  tablet (10 mg total) by mouth daily.  ? bismuth subsalicylate (PEPTO BISMOL) 262 MG chewable tablet Chew 524 mg by mouth as needed.  ? Continuous Blood Gluc Receiver (FREESTYLE LIBRE 2 READER) DEVI 1 each by Does not apply route daily.  ? Continuous Blood Gluc Sensor (FREESTYLE LIBRE 2 SENSOR) MISC 1 each by Does not apply route every 14 (fourteen) days.  ? Dextrose, Diabetic Use, (GLUCOSE PO) Take by mouth. Patient uses Glucose Tablets with 15 grams of carbohydrates.  ? escitalopram (LEXAPRO) 5 MG tablet Take 1 tablet (5 mg total) by mouth daily.  ? hydrochlorothiazide (MICROZIDE) 12.5 MG capsule Take 1 capsule (12.5 mg total) by mouth daily.  ? HYDROcodone-acetaminophen (NORCO) 7.5-325 MG tablet Take 1-2 tablets by mouth every 6 (six) hours as needed.  ? insulin aspart (NOVOLOG FLEXPEN) 100 UNIT/ML FlexPen Inject 12-20 Units into the skin 3 (three) times daily with meals.  ? Insulin Pen Needle 32G X 4 MM MISC Use 4x a day  ? levETIRAcetam (KEPPRA) 500 MG tablet Take 1 tablet (500 mg total) by mouth 2 (two) times daily.  ? lidocaine (LIDODERM) 5 % PLACE 1 PATCH ONTO THE SKIN DAILY. REMOVE& DISCARD [facility]  WITHIN 12 HOURS  ? lisinopril (PRINIVIL,ZESTRIL) 20 MG tablet Take 20 mg by mouth daily.  ? metFORMIN (GLUCOPHAGE) 500 MG tablet Take 1 tablet (500 mg total) by mouth 2 (two) times daily with a meal.  ? Multiple Vitamins-Minerals (ZINC PO) Take 1 tablet by mouth daily.  ? polyethylene glycol (MIRALAX / GLYCOLAX) 17 g packet Take 17 g by mouth daily as needed for mild constipation.  ? pravastatin (PRAVACHOL) 40 MG tablet Take 40 mg by mouth daily.  ? Semaglutide,0.25 or 0.'5MG'$ /DOS, (OZEMPIC, 0.25 OR 0.5 MG/DOSE,) 2 MG/1.5ML SOPN Inject 0.5 mg into the skin once a week.  ? Vitamin D, Ergocalciferol, (DRISDOL) 1.25 MG (50000 UNIT) CAPS capsule Take 1 capsule (50,000 Units total) by mouth every 7 (seven) days.  ? [DISCONTINUED] Insulin Glargine (BASAGLAR KWIKPEN) 100 UNIT/ML Inject 70 Units into the skin daily.   ? ? ?ROS:  ?Review of Systems  ?Respiratory:  Negative for shortness of breath.   ?Cardiovascular:  Negative for chest pain.  ?Psychiatric/Behavioral:  Negative for suicidal ideas. The patient is nervous/anxious (improved).   ? ? ?Objective:  ? ?Today's Vitals: BP 126/78 (BP Location: Right Arm, Patient Position: Sitting, Cuff Size: Large)   Pulse 81   Temp 98.5 ?F (36.9 ?C) (Oral)   Ht '5\' 4"'$  (1.626 m)   Wt 256 lb (116.1 kg)   SpO2 96%   BMI 43.94 kg/m?  ? ?  11/01/2021  ?  1:47 PM 10/25/2021  ?  1:03 PM 10/02/2021  ?  2:58 PM  ?Vitals with BMI  ?Height '5\' 4"'$  '5\' 4"'$  '5\' 4"'$   ?Weight 256 lbs 253 lbs 6 oz 257 lbs  ?BMI 43.92 43.47 44.09  ?Systolic 154 008 676  ?Diastolic 78 76 80  ?Pulse 81 74 94  ?  ? ?Physical Exam ?Vitals reviewed.  ?Constitutional:   ?   General: She is not in acute distress. ?   Appearance: Normal appearance.  ?HENT:  ?   Head: Normocephalic and atraumatic.  ?Neck:  ?   Vascular: No carotid bruit.  ?Cardiovascular:  ?   Rate and Rhythm: Normal rate and regular rhythm.  ?   Pulses: Normal pulses.  ?   Heart sounds: Normal heart sounds.  ?Pulmonary:  ?   Effort: Pulmonary effort is normal.  ?   Breath sounds: Normal breath sounds.  ?Skin: ?   General: Skin is warm and dry.  ?Neurological:  ?   General: No focal deficit present.  ?   Mental Status: She is alert and oriented to person, place, and time.  ?Psychiatric:     ?   Mood and Affect: Mood normal.     ?   Behavior: Behavior normal.     ?   Judgment: Judgment normal.  ? ? ? ? ? ? ? ?Assessment and Plan  ? ?1. Moderate episode of recurrent major depressive disorder ([place])   ? ? ? ?Plan: ?See plan via problem list below. ? ? ?Tests ordered ?No orders of the defined types were placed in this encounter. ? ? ? ? ?Meds ordered this encounter  ?Medications  ? Insulin Glargine (BASAGLAR KWIKPEN) 100 UNIT/ML  ?  Sig: Inject 40 Units into the skin daily.  ?  Dispense:  45 mL  ?  Refill:  3  ?  Order Specific Question:   Supervising Provider  ?  Answer:    Binnie Rail [[id number]]  ? ? ?Patient to follow-up in 3 months, or sooner as needed. ? ?[facility]  Carmie Kanner, NP ? ?

## 2021-11-01 NOTE — Assessment & Plan Note (Signed)
Chronic, stable.  She was encouraged to continue following up with endocrinology as well as to manage her blood sugars based on instructions given to her from them.  She is agreeable to this plan. ?

## 2021-11-07 ENCOUNTER — Encounter: Payer: Self-pay | Admitting: Internal Medicine

## 2021-12-07 ENCOUNTER — Other Ambulatory Visit: Payer: Self-pay | Admitting: Neurology

## 2021-12-17 ENCOUNTER — Encounter (INDEPENDENT_AMBULATORY_CARE_PROVIDER_SITE_OTHER): Payer: Federal, State, Local not specified - PPO | Admitting: Ophthalmology

## 2021-12-17 DIAGNOSIS — E113313 Type 2 diabetes mellitus with moderate nonproliferative diabetic retinopathy with macular edema, bilateral: Secondary | ICD-10-CM | POA: Diagnosis not present

## 2021-12-17 DIAGNOSIS — I10 Essential (primary) hypertension: Secondary | ICD-10-CM | POA: Diagnosis not present

## 2021-12-17 DIAGNOSIS — H35033 Hypertensive retinopathy, bilateral: Secondary | ICD-10-CM | POA: Diagnosis not present

## 2021-12-17 DIAGNOSIS — H43813 Vitreous degeneration, bilateral: Secondary | ICD-10-CM

## 2021-12-27 ENCOUNTER — Ambulatory Visit: Payer: Federal, State, Local not specified - PPO | Admitting: Internal Medicine

## 2021-12-27 ENCOUNTER — Encounter: Payer: Self-pay | Admitting: Internal Medicine

## 2021-12-27 VITALS — BP 142/70 | HR 88 | Ht 64.0 in | Wt 248.6 lb

## 2021-12-27 DIAGNOSIS — E113211 Type 2 diabetes mellitus with mild nonproliferative diabetic retinopathy with macular edema, right eye: Secondary | ICD-10-CM | POA: Diagnosis not present

## 2021-12-27 DIAGNOSIS — E782 Mixed hyperlipidemia: Secondary | ICD-10-CM | POA: Diagnosis not present

## 2021-12-27 DIAGNOSIS — Z794 Long term (current) use of insulin: Secondary | ICD-10-CM

## 2021-12-27 DIAGNOSIS — Z6841 Body Mass Index (BMI) 40.0 and over, adult: Secondary | ICD-10-CM

## 2021-12-27 LAB — POCT GLYCOSYLATED HEMOGLOBIN (HGB A1C): Hemoglobin A1C: 5.5 % (ref 4.0–5.6)

## 2021-12-27 MED ORDER — NOVOLOG FLEXPEN 100 UNIT/ML ~~LOC~~ SOPN: 6.0000 [IU] | PEN_INJECTOR | Freq: Three times a day (TID) | SUBCUTANEOUS | 3 refills | Status: DC

## 2021-12-27 MED ORDER — BASAGLAR KWIKPEN 100 UNIT/ML ~~LOC~~ SOPN: 28.0000 [IU] | PEN_INJECTOR | Freq: Every day | SUBCUTANEOUS | 3 refills | Status: DC

## 2021-12-27 NOTE — Progress Notes (Signed)
Patient ID: ARNITRA SOKOLOSKI, female   DOB: [birth year], 71 y.o.   MRN: [id number]  This visit occurred during the SARS-CoV-2 public health emergency.  Safety protocols were in place, including screening questions prior to the visit, additional usage of staff PPE, and extensive cleaning of exam room while observing appropriate contact time as indicated for disinfecting solutions.   HPI: LENDA BARATTA is a 71 y.o.-year-old female, initially referred by Dr. Janie Morning, returning for follow-up for DM2, dx at the end of 1990s, insulin-dependent soon after dx, uncontrolled, with complications (diabetic retinopathy, PN).  She is the sister of [name], who is also my patient.  Last visit 2 months ago.  Interim history: No increased urination, blurry vision, nausea, chest pain.  Reviewed HbA1c: Lab Results  Component Value Date   HGBA1C 8.1 (A) 09/13/2021   HGBA1C 13.6 (H) 10/10/2020  07/05/2021: HbA1c 7.0%; 1,5 Anhydroglucitol 11.2 (6.9-29.3)  She was previously on: - Ozempic 0.5 mg weekly >> decreased appetite, "nothing tastes good" - Novolog 70/30: 55-45-30 units before B-L-D. Adds 5-10 units depending on the sugars before meals. She was on: Metformin-glyburide 500-5 mg 3x a day, with meals. Previously on Basaglar 70 units at bedtime.  Currently on: - Ozempic 0.5 mg weekly - Basaglar 70 >> 65 >> 60 >> 34 units in am - Novolog 15 min before meals - only if you eat a meal: 6 to 10 units She stopped metformin due to tremors (?).  Pt checks her sugars >4x a day with her Freestyle Libre 2 CGM -checks with the receiver, as her phone is older:    Previously:   Previously:   Lowest sugar was 60s >> 40s x1 >> 60s; she has hypoglycemia awareness at 100.  Highest sugar was 300s >> <180 >> 242 She was in the emergency room with hyperglycemia in 12/2020.  She had steroid inj's in shoulder: 2 in 04/2021 and 1 in 08/2021. She increased fried foods before our visit in 09/2021.  - no CKD,  last BUN/creatinine:  Lab Results  Component Value Date   BUN 18 09/13/2021   BUN 22 12/22/2020   CREATININE 0.90 09/13/2021   CREATININE 0.97 12/22/2020  On lisinopril 20 mg daily.  - + HL; last set of lipids: Lipid Panel   2021-07-05    Cholesterol 146   <200  Triglycerides 56   <150  HDL Cholesterol 55   >39  Cholesterol / HDL Ratio 2.65   0.00-4.44  Non-HDL Cholesterol 91   <130  LDL Cholesterol (Calculation) 80   <130   No results found for: CHOL, HDL, LDLCALC, LDLDIRECT, TRIG, CHOLHDL On pravastatin 40 mg daily.  - last eye exam was on 12/03/2021: Dr Clifton James >> 12/17/2021: Dr. Zigmund Daniel: macular edema - started Avastin. Had significant pain in L eye. 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    - no numbness and tingling in her feet.  Pt has FH of DM in B and S.  She has a history of HTN, meningioma, s/p resection 10/2020 - h/o seizures.  ROS: + see HPI  Past Medical History:  Diagnosis Date   Diabetes mellitus without complication ([place])    Hypertension    Past Surgical History:  Procedure Laterality Date   APPLICATION OF CRANIAL NAVIGATION N/A 10/11/2020   Procedure: APPLICATION OF CRANIAL NAVIGATION;  Surgeon: [name], MD;  Location: [place];  Service: Neurosurgery;  Laterality: N/A;   CRANIOTOMY N/A 10/11/2020   Procedure: CRANIOTOMY FOR  RESECTION OF MENINGIOMA;  Surgeon: [name], MD;  Location: [place];  Service: Neurosurgery;  Laterality: N/A;   REPLACEMENT TOTAL KNEE     TONSILLECTOMY     Social History   Socioeconomic History   Marital status: Divorced    Spouse name: Not on file   Number of children: 1   Years of education: Not on file   Highest education level: Not on file  Occupational History   Occupation: retired   Occupation: Retired from [employer] Use   Smoking status: Unknown   Smokeless tobacco: Former    Quit date: 1970  Substance and Sexual Activity   Alcohol use: Not  Currently   Drug use: No   Sexual activity: Not Currently  Other Topics Concern   Not on file  Social History Narrative   Not on file   Social Determinants of Health   Financial Resource Strain: Not on file  Food Insecurity: Not on file  Transportation Needs: Not on file  Physical Activity: Not on file  Stress: Not on file  Social Connections: Not on file  Intimate Partner Violence: Not on file   Current Outpatient Medications on File Prior to Visit  Medication Sig Dispense Refill   acetaminophen (TYLENOL) 500 MG tablet Take by mouth.     amLODipine (NORVASC) 10 MG tablet Take 1 tablet (10 mg total) by mouth daily. 90 tablet 1   bismuth subsalicylate (PEPTO BISMOL) 262 MG chewable tablet Chew 524 mg by mouth as needed.     Continuous Blood Gluc Receiver (FREESTYLE LIBRE 2 READER) DEVI 1 each by Does not apply route daily. 1 each 0   Continuous Blood Gluc Sensor (FREESTYLE LIBRE 2 SENSOR) MISC 1 each by Does not apply route every 14 (fourteen) days. 6 each 3   Dextrose, Diabetic Use, (GLUCOSE PO) Take by mouth. Patient uses Glucose Tablets with 15 grams of carbohydrates.     escitalopram (LEXAPRO) 5 MG tablet Take 1 tablet (5 mg total) by mouth daily. 90 tablet 0   hydrochlorothiazide (MICROZIDE) 12.5 MG capsule Take 1 capsule (12.5 mg total) by mouth daily. 30 capsule 0   HYDROcodone-acetaminophen (NORCO) 7.5-325 MG tablet Take 1-2 tablets by mouth every 6 (six) hours as needed.     insulin aspart (NOVOLOG FLEXPEN) 100 UNIT/ML FlexPen Inject 12-20 Units into the skin 3 (three) times daily with meals. 45 mL 3   Insulin Glargine (BASAGLAR KWIKPEN) 100 UNIT/ML Inject 40 Units into the skin daily. 45 mL 3   Insulin Pen Needle 32G X 4 MM MISC Use 4x a day 300 each 3   levETIRAcetam (KEPPRA) 500 MG tablet Take 1 tablet (500 mg total) by mouth 2 (two) times daily. 60 tablet 1   lidocaine (LIDODERM) 5 % PLACE 1 PATCH ONTO THE SKIN DAILY. REMOVE& DISCARD PATCH WITHIN 12 HOURS 90 patch 0    lisinopril (PRINIVIL,ZESTRIL) 20 MG tablet Take 20 mg by mouth daily.     metFORMIN (GLUCOPHAGE) 500 MG tablet Take 1 tablet (500 mg total) by mouth 2 (two) times daily with a meal. 180 tablet 3   Multiple Vitamins-Minerals (ZINC PO) Take 1 tablet by mouth daily.     polyethylene glycol (MIRALAX / GLYCOLAX) 17 g packet Take 17 g by mouth daily as needed for mild constipation. 14 each 0   pravastatin (PRAVACHOL) 40 MG tablet Take 40 mg by mouth daily.     Semaglutide,0.25 or 0.'5MG'$ /DOS, (OZEMPIC, 0.25 OR 0.5 MG/DOSE,) 2 MG/1.5ML SOPN Inject 0.5  mg into the skin once a week. 4.5 mL 1   Vitamin D, Ergocalciferol, (DRISDOL) 1.25 MG (50000 UNIT) CAPS capsule Take 1 capsule (50,000 Units total) by mouth every 7 (seven) days. 8 capsule 0   No current facility-administered medications on file prior to visit.   Allergies  Allergen Reactions   Amoxicillin Shortness Of Breath   Penicillins Shortness Of Breath    Did it involve swelling of the face/tongue/throat, SOB, or low BP? Yes Did it involve sudden or severe rash/hives, skin peeling, or any reaction on the inside of your mouth or nose? No Did you need to seek medical attention at a hospital or doctor's office? Yes When did it last happen?       If all above answers are "NO", may proceed with cephalosporin use.    Family History  Problem Relation Age of Onset   Alcohol abuse Mother    Arthritis Mother    Stroke Mother    Alcohol abuse Father    Arthritis Father    Alcohol abuse Sister    Diabetes Sister    Alcohol abuse Sister    Arthritis Sister    Alcohol abuse Brother    Alcohol abuse Brother    Alcohol abuse Brother    Diabetes Brother    Alcohol abuse Daughter    PE: BP (!) 142/70 (BP Location: Left Arm, Patient Position: Sitting, Cuff Size: Normal)   Pulse 88   Ht '5\' 4"'$  (1.626 m)   Wt 248 lb 9.6 oz (112.8 kg)   SpO2 97%   BMI 42.67 kg/m  Wt Readings from Last 3 Encounters:  12/27/21 248 lb 9.6 oz (112.8 kg)  11/01/21 256  lb (116.1 kg)  10/25/21 253 lb 6.4 oz (114.9 kg)   Constitutional: overweight, in NAD Eyes: PERRLA, EOMI, no exophthalmos ENT: moist mucous membranes, no thyromegaly, no cervical lymphadenopathy Cardiovascular: RRR, No MRG Respiratory: CTA B Musculoskeletal: no deformities, strength intact in all 4 Skin: moist, warm, no rashes Neurological: no tremor with outstretched hands, DTR normal in all 4  ASSESSMENT: 1. DM2, insulin-dependent, uncontrolled, with complications - mild NPDR OD with macular edema  - PN  2.  Hyperlipidemia  3.  Obesity class III  PLAN:  1. Patient with longstanding, uncontrolled, type 2 diabetes, on injectable antidiabetic regimen with basal-bolus insulin and also weekly GLP-1 receptor agonist along with metformin.  After we started Ozempic, she describes reduced appetite and increase satiety and also decreased taste.  At last visit, blood sugars were mostly at goal, without hyperglycemic peaks, but she was having more lows in the 60s throughout the night and day.  We decreased the dose of Basaglar at that time.  We discussed about backing off even more on the insulin if she continues to see low blood sugars.  HbA1c at that time was 8.1%, however, the GMI per review of the CGM downloads for the previous 2 weeks was actually 5.7%, excellent. CGM interpretation: -At today's visit, we reviewed her CGM downloads: It appears that 99% of values are in target range (goal >70%), while 1% are higher than 180 (goal <25%), and 0% are lower than 70 (goal <4%).  The calculated average blood sugar is 115.  The projected HbA1c for the next 3 months (GMI) is 6.1%. -Reviewing the CGM trends, sugars appear to be fluctuating in the lower half of the target range throughout the day except being in the mid target range after dinner.  She has blood sugars in the 60s,  also (please see HPI).  At this visit, we discussed about reducing her insulin doses even more.  She definitely needs less  Basaglar.  We will back off to only 28 units in the morning and I advised her to continue to back off if low blood sugars persist.  Regarding NovoLog, she is already taking a lower dose than before, and I advised her to only use between 6 to 8 units before meals.  I advised her to be up to 10 units dose only before very large meals. - I suggested to:  Patient Instructions  Please continue: - Ozempic 0.5 mg weekly  Decrease: - Basaglar  28 units in am - Novolog 15 min before meals - only if you eat a meal: 6-8 (10) units  Please return in 4 months.  - we checked her HbA1c: 5.5% (best she had in a long time!) - advised to check sugars at different times of the day - 4x a day, rotating check times - advised for yearly eye exams >> she is UTD - return to clinic in 4 months  2.  Hyperlipidemia -Reviewed latest lipid panel from 07/2021: Fractions at goal -She continues on pravastatin 40 mg daily, tolerated well  3.  Obesity class III -We will continue Ozempic which should also help with weight loss -She gained 5 pounds before last visit and she lost them since then  Philemon Kingdom, MD PhD [facility] Endocrinology

## 2021-12-27 NOTE — Patient Instructions (Addendum)
Please continue: - Ozempic 0.5 mg weekly  Decrease: - Basaglar  28 units in am - Novolog 15 min before meals - only if you eat a meal: 6-8 (10) units  Please return in 4 months.

## 2022-01-13 ENCOUNTER — Encounter (INDEPENDENT_AMBULATORY_CARE_PROVIDER_SITE_OTHER): Payer: Federal, State, Local not specified - PPO | Admitting: Ophthalmology

## 2022-01-14 ENCOUNTER — Encounter: Payer: Self-pay | Admitting: Nurse Practitioner

## 2022-01-16 ENCOUNTER — Other Ambulatory Visit: Payer: Self-pay | Admitting: Nurse Practitioner

## 2022-01-16 ENCOUNTER — Other Ambulatory Visit: Payer: Self-pay | Admitting: Neurosurgery

## 2022-01-16 DIAGNOSIS — D329 Benign neoplasm of meninges, unspecified: Secondary | ICD-10-CM

## 2022-01-16 DIAGNOSIS — Z794 Long term (current) use of insulin: Secondary | ICD-10-CM

## 2022-01-16 MED ORDER — PRAVASTATIN SODIUM 40 MG PO TABS: 40.0000 mg | ORAL_TABLET | Freq: Every day | ORAL | 0 refills | Status: DC

## 2022-01-25 ENCOUNTER — Other Ambulatory Visit: Payer: Federal, State, Local not specified - PPO

## 2022-02-03 ENCOUNTER — Ambulatory Visit
Admission: RE | Admit: 2022-02-03 | Discharge: 2022-02-03 | Disposition: A | Discharge status: Home / Self Care | Payer: Federal, State, Local not specified - PPO | Source: Ambulatory Visit | Attending: Neurosurgery | Admitting: Neurosurgery

## 2022-02-03 DIAGNOSIS — D329 Benign neoplasm of meninges, unspecified: Secondary | ICD-10-CM

## 2022-02-03 MED ORDER — GADOBENATE DIMEGLUMINE 529 MG/ML IV SOLN
20.0000 mL | Freq: Once | INTRAVENOUS | Status: AC | PRN
  Administered 2022-02-03: 20 mL via INTRAVENOUS

## 2022-02-05 ENCOUNTER — Encounter: Payer: Self-pay | Admitting: Internal Medicine

## 2022-02-06 ENCOUNTER — Other Ambulatory Visit: Payer: Self-pay | Admitting: Internal Medicine

## 2022-02-06 DIAGNOSIS — E119 Type 2 diabetes mellitus without complications: Secondary | ICD-10-CM

## 2022-02-06 MED ORDER — OZEMPIC (0.25 OR 0.5 MG/DOSE) 2 MG/1.5ML ~~LOC~~ SOPN: 0.5000 mg | PEN_INJECTOR | SUBCUTANEOUS | 3 refills | Status: DC

## 2022-02-06 MED ORDER — BASAGLAR KWIKPEN 100 UNIT/ML ~~LOC~~ SOPN: 28.0000 [IU] | PEN_INJECTOR | Freq: Every day | SUBCUTANEOUS | 3 refills | Status: DC

## 2022-02-07 ENCOUNTER — Ambulatory Visit: Payer: Federal, State, Local not specified - PPO | Admitting: Nurse Practitioner

## 2022-02-07 VITALS — BP 136/74 | HR 78 | Temp 98.0°F | Ht 64.0 in | Wt 248.0 lb

## 2022-02-07 DIAGNOSIS — E559 Vitamin D deficiency, unspecified: Secondary | ICD-10-CM | POA: Diagnosis not present

## 2022-02-07 DIAGNOSIS — E113212 Type 2 diabetes mellitus with mild nonproliferative diabetic retinopathy with macular edema, left eye: Secondary | ICD-10-CM

## 2022-02-07 DIAGNOSIS — Z794 Long term (current) use of insulin: Secondary | ICD-10-CM | POA: Diagnosis not present

## 2022-02-07 DIAGNOSIS — F331 Major depressive disorder, recurrent, moderate: Secondary | ICD-10-CM

## 2022-02-07 LAB — COMPREHENSIVE METABOLIC PANEL
ALT: 19 U/L (ref 0–35)
AST: 23 U/L (ref 0–37)
Albumin: 4.2 g/dL (ref 3.5–5.2)
Alkaline Phosphatase: 60 U/L (ref 39–117)
BUN: 21 mg/dL (ref 6–23)
CO2: 33 mEq/L — ABNORMAL HIGH (ref 19–32)
Calcium: 9.4 mg/dL (ref 8.4–10.5)
Chloride: 98 mEq/L (ref 96–112)
Creatinine, Ser: 1.01 mg/dL (ref 0.40–1.20)
GFR: 55.47 mL/min — ABNORMAL LOW (ref >60.00)
Glucose, Bld: 131 mg/dL — ABNORMAL HIGH (ref 70–99)
Potassium: 3.9 mEq/L (ref 3.5–5.1)
Sodium: 136 mEq/L (ref 135–145)
Total Bilirubin: 0.7 mg/dL (ref 0.2–1.2)
Total Protein: 7.4 g/dL (ref 6.0–8.3)

## 2022-02-07 LAB — VITAMIN D 25 HYDROXY (VIT D DEFICIENCY, FRACTURES): VITD: 26.71 ng/mL — ABNORMAL LOW (ref 30.00–100.00)

## 2022-02-07 LAB — LIPID PANEL
Cholesterol: 129 mg/dL (ref 0–200)
HDL: 48.2 mg/dL (ref >39.00)
LDL Cholesterol: 65 mg/dL (ref 0–99)
NonHDL: 80.38
Total CHOL/HDL Ratio: 3
Triglycerides: 76 mg/dL (ref 0.0–149.0)
VLDL: 15.2 mg/dL (ref 0.0–40.0)

## 2022-02-07 NOTE — Progress Notes (Signed)
Established Patient Office Visit  Subjective   Patient ID: Regina Mckay, female    DOB: [birth year]  Age: 71 y.o. MRN: [id number]  Chief Complaint  Patient presents with   80mofollow up   She has type 2 diabetes and continues to follow with Dr. GCruzita Lederer  Last A1c was 5.5.  She is very happy and pleased with her progress in her diabetic treatment.  She continues on ACE and statin.  She is also on NovoLog, metformin, and Ozempic.  She is tolerating her medication well.  She also was found to have vitamin D deficiency.  She was on vitamin D2, but tells me she is no longer taking this.  Last serum level showed vitamin D of 15.  She is on pravastatin, and due for lipid check today.  She has history of anxiety and depression.  She was on Lexapro, but discontinued this due to running out of medication.  She reports that she would prefer not to restart this  at this time.  She denies suicidal ideation today.      Review of Systems  Eyes:  Negative for blurred vision.  Respiratory:  Negative for shortness of breath.   Cardiovascular:  Negative for chest pain.  Neurological:  Negative for dizziness and headaches.  Psychiatric/Behavioral:  Negative for suicidal ideas.       Objective:     BP 136/74 (BP Location: Right Arm, Patient Position: Sitting, Cuff Size: Large)   Pulse 78   Temp 98 F (36.7 C) (Oral)   Ht '5\' 4"'$  (1.626 m)   Wt 248 lb (112.5 kg)   SpO2 96%   BMI 42.57 kg/m  BP Readings from Last 3 Encounters:  02/07/22 136/74  12/27/21 (!) 142/70  11/01/21 126/78   Wt Readings from Last 3 Encounters:  02/07/22 248 lb (112.5 kg)  12/27/21 248 lb 9.6 oz (112.8 kg)  11/01/21 256 lb (116.1 kg)      Physical Exam   No results found for any visits on 02/07/22.  Last metabolic panel Lab Results  Component Value Date   GLUCOSE 236 (H) 09/13/2021   NA 134 (L) 09/13/2021   K 4.1 09/13/2021   CL 96 09/13/2021   CO2 31 09/13/2021   BUN 18 09/13/2021   CREATININE  0.90 09/13/2021   GFRNONAA >60 12/22/2020   CALCIUM 9.6 09/13/2021   PHOS 2.7 10/17/2020   PROT 7.4 09/13/2021   ALBUMIN 4.1 09/13/2021   BILITOT 1.1 09/13/2021   ALKPHOS 52 09/13/2021   AST 19 09/13/2021   ALT 16 09/13/2021   ANIONGAP 10 12/22/2020     The ASCVD Risk score (Arnett DK, et al., 2019) failed to calculate for the following reasons:   Cannot find a previous HDL lab   Cannot find a previous total cholesterol lab   The smoking status is invalid    Assessment & Plan:   Problem List Items Addressed This Visit       Endocrine   Type 2 diabetes mellitus with right eye affected by mild nonproliferative retinopathy and macular edema, with long-term current use of insulin (HCC)    Chronic, stable.  A1c at goal.  Patient encouraged to continue taking medications as prescribed and follow-up with endocrinology as scheduled.        Other   Moderate episode of recurrent major depressive disorder (HCC)    Chronic, stable.  Will not prescribe Lexapro per patient preference.  Patient was encouraged to let me know if her  mood or anxiety worsens between now and her next appointment, she reports her understanding.      Vitamin D deficiency - Primary    Chronic, will check serum vitamin D level, further recommendations may be made based upon these results.      Relevant Orders   Comprehensive metabolic panel   VITAMIN D 25 Hydroxy (Vit-D Deficiency, Fractures)    Return in about 6 months (around 08/10/2022) for Follow-up with Gali Spinney.    Ailene Ards, NP

## 2022-02-07 NOTE — Assessment & Plan Note (Signed)
Chronic, stable.  A1c at goal.  Patient encouraged to continue taking medications as prescribed and follow-up with endocrinology as scheduled.

## 2022-02-07 NOTE — Assessment & Plan Note (Signed)
Chronic, will check serum vitamin D level, further recommendations may be made based upon these results.

## 2022-02-07 NOTE — Assessment & Plan Note (Signed)
Chronic, stable.  Will not prescribe Lexapro per patient preference.  Patient was encouraged to let me know if her mood or anxiety worsens between now and her next appointment, she reports her understanding.

## 2022-02-14 ENCOUNTER — Other Ambulatory Visit: Payer: Self-pay | Admitting: Nurse Practitioner

## 2022-02-14 DIAGNOSIS — E113211 Type 2 diabetes mellitus with mild nonproliferative diabetic retinopathy with macular edema, right eye: Secondary | ICD-10-CM

## 2022-02-20 ENCOUNTER — Encounter: Payer: Self-pay | Admitting: Nurse Practitioner

## 2022-02-20 DIAGNOSIS — E113211 Type 2 diabetes mellitus with mild nonproliferative diabetic retinopathy with macular edema, right eye: Secondary | ICD-10-CM

## 2022-02-20 MED ORDER — PRAVASTATIN SODIUM 40 MG PO TABS: 40.0000 mg | ORAL_TABLET | Freq: Every day | ORAL | 1 refills | Status: DC

## 2022-03-05 IMAGING — MR MR HEAD WO/W CM
13 series · 48 of 48 positions shown · IV contrast (Multihance)
Comparison: Postoperative brain MRI 01/30/2021 and earlier.

CLINICAL DATA: 70-year-old female status post Meningioma resection
in [REDACTED]. Restaging.

EXAM:
MRI HEAD WITHOUT AND WITH CONTRAST
TECHNIQUE: Multiplanar, multiecho pulse sequences of the brain and surrounding
structures were obtained without and with intravenous contrast.
CONTRAST:  20mL MULTIHANCE GADOBENATE DIMEGLUMINE 529 MG/ML IV SOLN

[Series 2: T1 · sagittal · 5.0mm · 0.47mm/px · 1 of 27 slices shown]
[im 1/27]
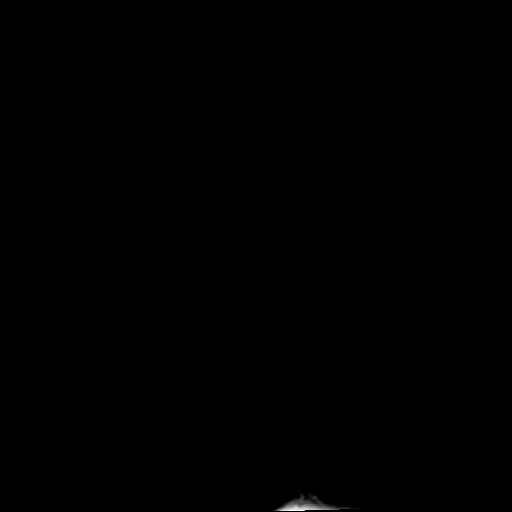

[Series 3: ax ep2d_diff_3 · axial · 3.0mm · 1.80mm/px · z∈[-66,+92]mm · 6 of 104 slices shown]
[im 1/104]
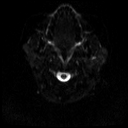
[im 21/104]
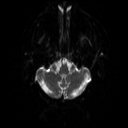
[im 42/104]
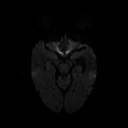
[im 62/104]
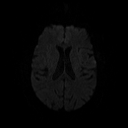
[im 83/104]
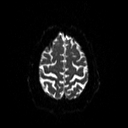
[im 104/104]
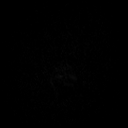

[Series 4: ax ep2d_diff_3_adc · axial · 3.0mm · 1.80mm/px · z∈[-66,+95]mm · 3 of 55 slices shown]
[im 1/55]
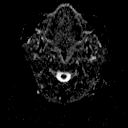
[im 28/55]
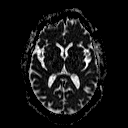
[im 55/55]
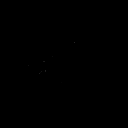

[Series 5: cor ep2d_diff · coronal · 5.0mm · 1.77mm/px · 3 of 60 slices shown]
[im 1/60]
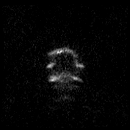
[im 30/60]
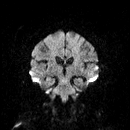
[im 60/60]
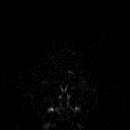

[Series 6: cor ep2d_diff_adc · coronal · 5.0mm · 1.77mm/px · 2 of 30 slices shown]
[im 1/30]
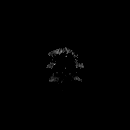
[im 30/30]
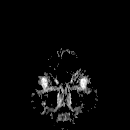

[Series 8: swi_images · axial · 2.0mm · 0.98mm/px · z∈[-63,+94]mm · 5 of 80 slices shown]
[im 1/80]
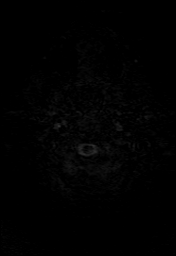
[im 20/80]
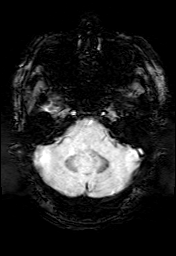
[im 40/80]
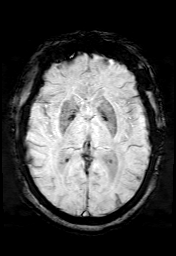
[im 60/80]
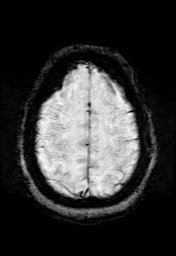
[im 80/80]
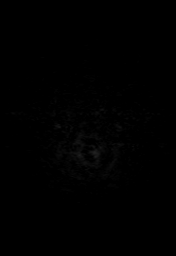

[Series 9: FLAIR · axial · 3.0mm · 0.43mm/px · z∈[-61,+90]mm · 2 of 40 slices shown]
[im 1/40]
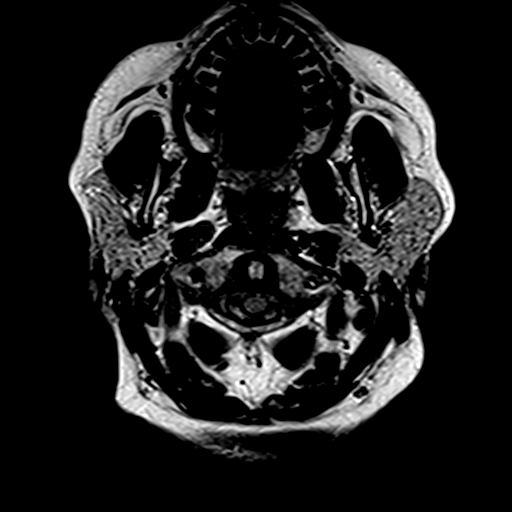
[im 40/40]
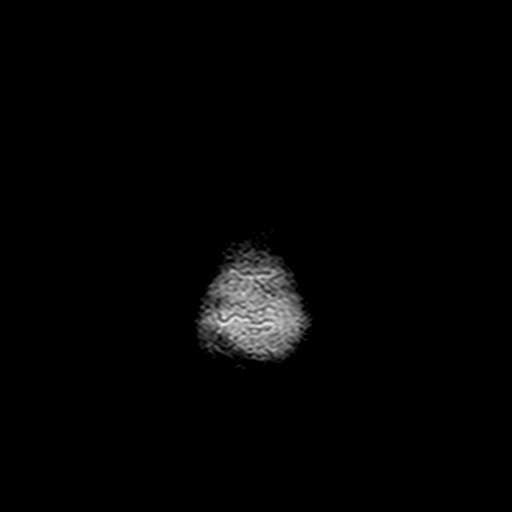

[Series 10: T2 · axial · 5.0mm · 0.65mm/px · z∈[-62,+93]mm · 2 of 27 slices shown (1 of 2)]
[im 1/27]
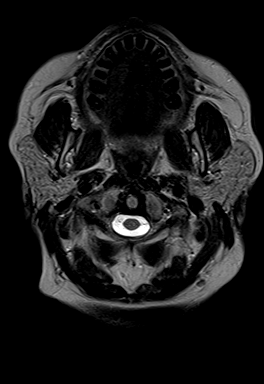
[im 27/27]
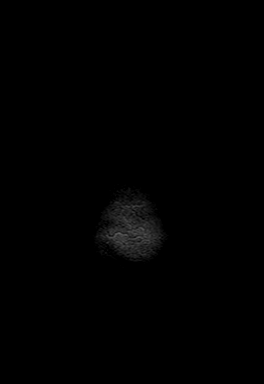

[Series 11: t1_mpr_tra · axial · 1.0mm · 0.75mm/px · z∈[-63,+95]mm · 9 of 160 slices shown]
[im 1/160]
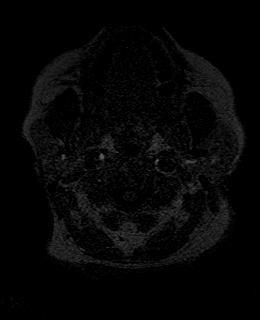
[im 20/160]
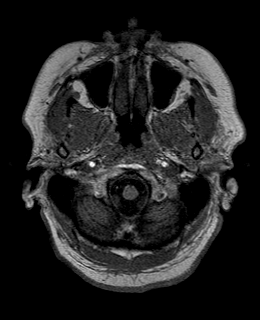
[im 40/160]
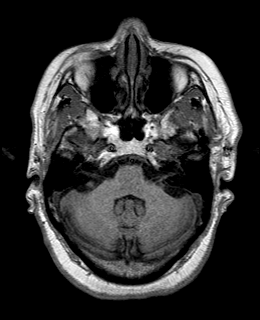
[im 60/160]
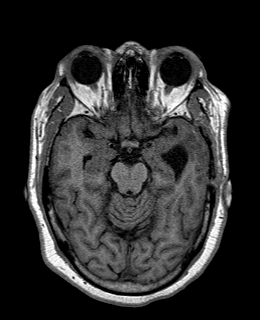
[im 80/160]
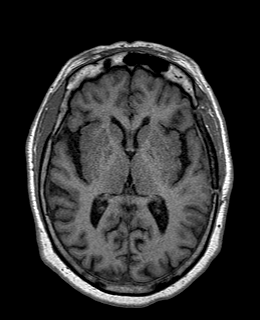
[im 100/160]
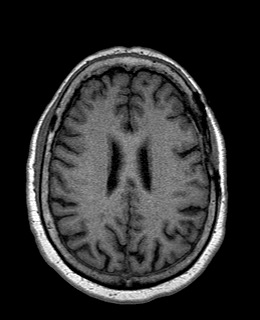
[im 120/160]
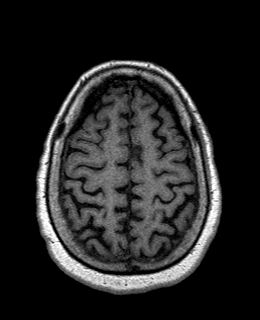
[im 140/160]
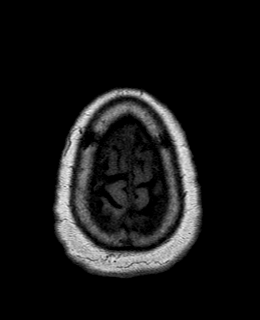
[im 160/160]
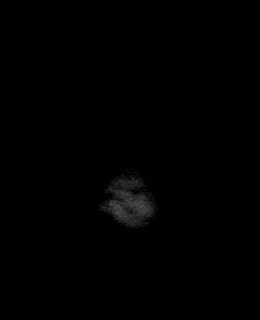

[Series 12: T2 · coronal · 5.0mm · 0.43mm/px · 2 of 32 slices shown (2 of 2)]
[im 1/32]
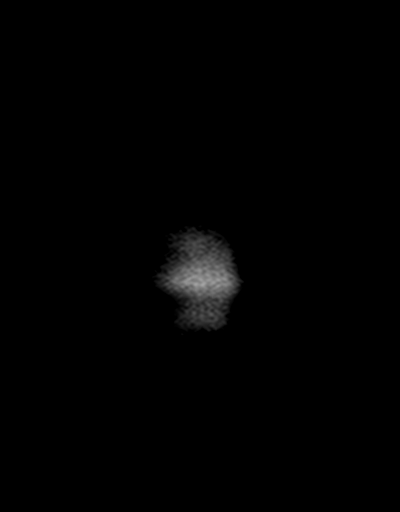
[im 32/32]
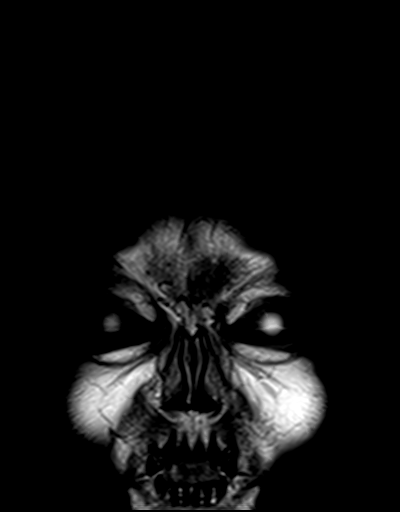

[Series 13: post t1_mpr_tra · axial · 1.0mm · 0.75mm/px · z∈[-63,+95]mm · 9 of 160 slices shown]
[im 1/160]
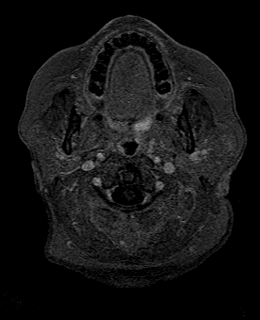
[im 20/160]
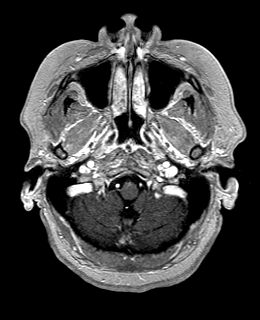
[im 40/160]
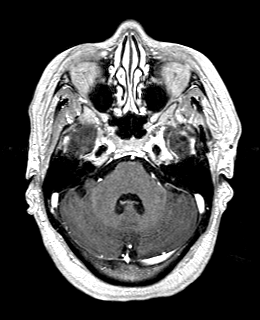
[im 60/160]
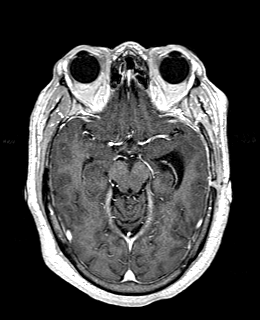
[im 80/160]
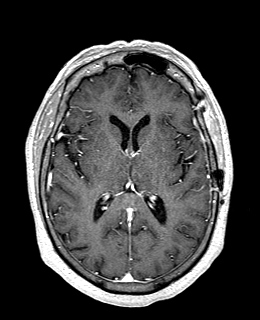
[im 100/160]
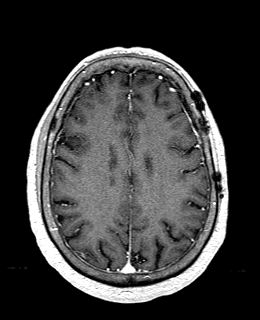
[im 120/160]
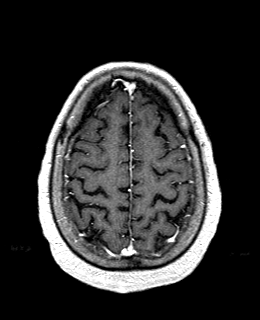
[im 140/160]
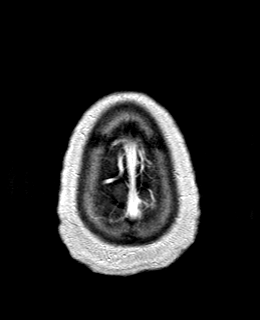
[im 160/160]
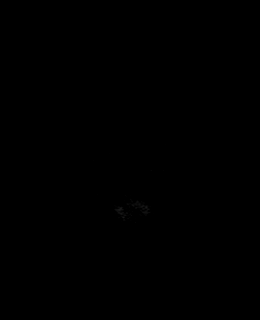

[Series 14: T1 post-contrast · coronal · 5.0mm · 0.72mm/px · 2 of 32 slices shown]
[im 1/32]
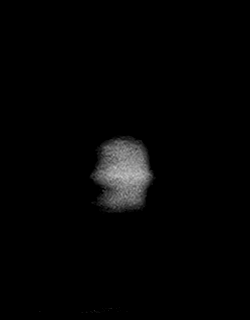
[im 32/32]
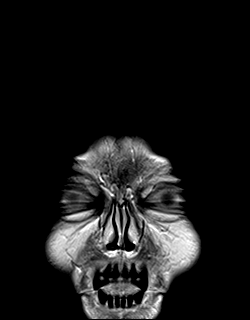

[Series 15: t1_se_sag post · sagittal · 5.0mm · 0.47mm/px · 2 of 27 slices shown]
[im 1/27]
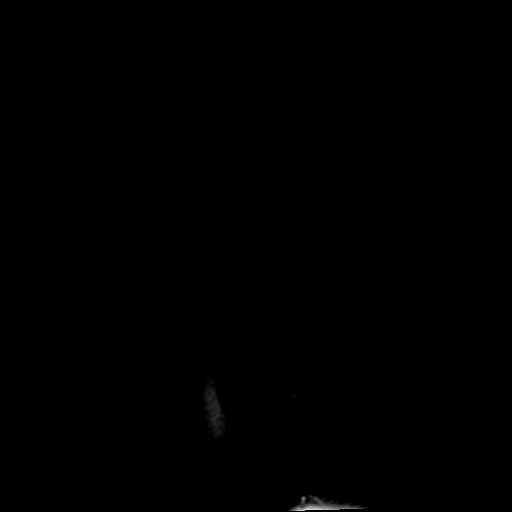
[im 27/27]
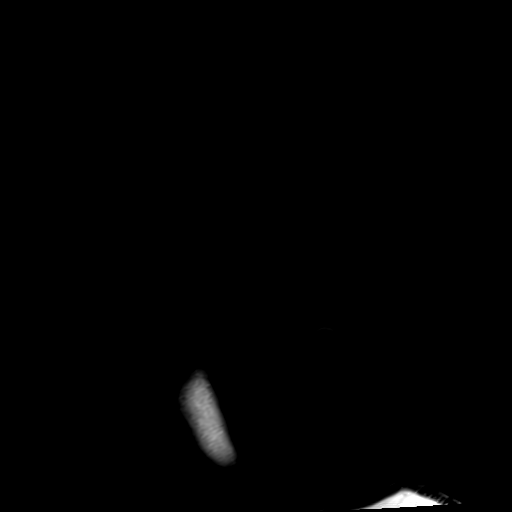

[48 of 48 positions shown; findings below may reference images not displayed]

FINDINGS: Brain: Ex vacuo enlargement of the left frontal horn has increased
since [REDACTED] with regional mild anterior left temporal lobe
postoperative encephalomalacia and small inferior temporal resection
cavity. T2 and FLAIR hyperintensity in the region is stable. No mass
effect. Following contrast there is no masslike enhancement.
Residual dural thickening along the floor of the left middle cranial
fossa inseparable from Meckel's cave appears stable. See series 14,
images 19 and 20). Decrease lateral dural thickening underlying the
craniotomy.

No superimposed restricted diffusion to suggest acute infarction. No
midline shift, mass effect, evidence of mass lesion,
ventriculomegaly, extra-axial collection or acute intracranial
hemorrhage. No new signal abnormality. Cervicomedullary junction and
pituitary are within normal limits.

Vascular: Major intracranial vascular flow voids are stable. The
major dural venous sinuses are enhancing and appear to be patent.

Skull and upper cervical spine: Stable left craniotomy. Visualized
bone marrow signal is within normal limits. Negative visible
cervical spine.

Sinuses/Orbits: Stable, negative.

Other: Mastoids remain clear. Visible internal auditory structures
appear normal.
IMPRESSION: 1. Continued satisfactory postoperative appearance of the brain.
Attention on follow-up directed to residual dural thickening along
the floor of the left middle cranial fossa inseparable from Meckel's
cave.

2. No new intracranial abnormality.

## 2022-03-24 ENCOUNTER — Other Ambulatory Visit: Payer: Self-pay | Admitting: Internal Medicine

## 2022-03-24 ENCOUNTER — Encounter: Payer: Self-pay | Admitting: Internal Medicine

## 2022-04-03 ENCOUNTER — Other Ambulatory Visit: Payer: Self-pay | Admitting: Nurse Practitioner

## 2022-04-03 DIAGNOSIS — I10 Essential (primary) hypertension: Secondary | ICD-10-CM

## 2022-04-08 ENCOUNTER — Ambulatory Visit: Payer: Federal, State, Local not specified - PPO | Admitting: Neurology

## 2022-04-08 ENCOUNTER — Encounter: Payer: Self-pay | Admitting: Neurology

## 2022-04-08 VITALS — BP 124/58 | HR 88 | Ht 64.0 in | Wt 247.0 lb

## 2022-04-08 DIAGNOSIS — D329 Benign neoplasm of meninges, unspecified: Secondary | ICD-10-CM

## 2022-04-08 DIAGNOSIS — G40909 Epilepsy, unspecified, not intractable, without status epilepticus: Secondary | ICD-10-CM

## 2022-04-08 NOTE — Patient Instructions (Signed)
Discontinue Keppra  Continue your other medications  Contact me if you have a breakthrough seizure or any seizure like activity  Low threshold to restart Keppra 500 mg twice daily  Follow up in a year

## 2022-04-08 NOTE — Progress Notes (Signed)
[facility]  PATIENT: Regina Mckay DOB: Apr 18, 1951  REQUESTING CLINICIAN: Ailene Ards, NP HISTORY FROM: Patient  REASON FOR VISIT: Seizure/ Meningioma    HISTORICAL  CHIEF COMPLAINT:  Chief Complaint  Patient presents with   Follow-up    Room 12, alone States she is doing well reports no seizure, and has started taking keppra 500 mg ONCE a day last week     INTERVAL HISTORY 04/08/22 Patient presents today for follow-up, since last visit in March, she is doing much better.  She denies any seizure or seizure-like activity.  She is compliant with the Keppra 500 mg twice daily.  EEG was completed, and was normal.  She reported following up with surgeon and was told that everything was normal.  Patient reports for the past 2 weeks she almost ran out of medication and self decrease the Keppra to 500 mg nightly, again no seizure or seizure-like activity.    HISTORY OF PRESENT ILLNESS:  This is a 71 year old woman with past medical history of diabetes, hypertension, depression, seizure in the setting of meningioma who is presenting to establish care.  Patient reports last year in March she presented to the hospital for seizures.  She said that she could not speak, felt her heart racing but she was aware of the entire episode.  She reports 2 weeks prior to presentation in the hospital she started having those episodes that she first thought that she was choking.  During her work-up in the ED she was found to have a left temporal poll meningioma.  She did have an EEG at that time which showed left temporal slowing.  She she was told that her symptoms were likely left temporal seizures.  She was started on Keppra 1000 mg twice daily and had meningioma removed on March 10.  Since that she has been doing well, denies any seizure-like activity, in December her neurosurgeon decrease the Keppra to 500 mg twice daily, repeat MRI did not show recurrence of the meningioma and no  seizure-like activity.   Patient is compliant with the medication, denies any side effect   Handedness: Right handed   Onset: 2022, last seizure March 2022  Seizure Type: Focal aware   Current frequency: Last seizure was in March 2022   Any injuries from seizures: None  Seizure risk factors: Meningioma  Previous ASMs: Levetiracetam  Currenty ASMs: Levetiracetam 500 mg twice daily  ASMs side effects: Denies  Brain Images: Left middle cranial fossa meningioma  Previous EEGs: Left temporal slowing   OTHER MEDICAL CONDITIONS: Meningioma, Seizures, DM, HTN, Depression   REVIEW OF SYSTEMS: Full 14 system review of systems performed and negative with exception of: as noted in the HPI   ALLERGIES: Allergies  Allergen Reactions   Amoxicillin Shortness Of Breath   Penicillins Shortness Of Breath    Did it involve swelling of the face/tongue/throat, SOB, or low BP? Yes Did it involve sudden or severe rash/hives, skin peeling, or any reaction on the inside of your mouth or nose? No Did you need to seek medical attention at a hospital or doctor's office? Yes When did it last happen?       If all above answers are "NO", may proceed with cephalosporin use.     HOME MEDICATIONS: Outpatient Medications Prior to Visit  Medication Sig Dispense Refill   acetaminophen (TYLENOL) 500 MG tablet Take by mouth.     amLODipine (NORVASC) 10 MG tablet Take 1 tablet (10 mg total) by mouth  daily. 90 tablet 1   bismuth subsalicylate (PEPTO BISMOL) 262 MG chewable tablet Chew 524 mg by mouth as needed.     Continuous Blood Gluc Receiver (FREESTYLE LIBRE 2 READER) DEVI 1 each by Does not apply route daily. 1 each 0   Continuous Blood Gluc Sensor (FREESTYLE LIBRE 2 SENSOR) MISC 1 each by Does not apply route every 14 (fourteen) days. 6 each 3   Dextrose, Diabetic Use, (GLUCOSE PO) Take by mouth. Patient uses Glucose Tablets with 15 grams of carbohydrates.     hydrochlorothiazide (MICROZIDE) 12.5 MG  capsule Take 1 capsule (12.5 mg total) by mouth daily. 30 capsule 0   HYDROcodone-acetaminophen (NORCO) 7.5-325 MG tablet Take 1-2 tablets by mouth every 6 (six) hours as needed.     insulin aspart (NOVOLOG FLEXPEN) 100 UNIT/ML FlexPen Inject 6-10 Units into the skin 3 (three) times daily with meals. 30 mL 3   Insulin Glargine (BASAGLAR KWIKPEN) 100 UNIT/ML Inject 28 Units into the skin daily. 30 mL 3   Insulin Pen Needle 32G X 4 MM MISC Use 4x a day 300 each 3   lidocaine (LIDODERM) 5 % PLACE 1 PATCH ONTO THE SKIN DAILY. REMOVE& DISCARD PATCH WITHIN 12 HOURS 90 patch 0   lisinopril (PRINIVIL,ZESTRIL) 20 MG tablet Take 20 mg by mouth daily.     pravastatin (PRAVACHOL) 40 MG tablet Take 1 tablet (40 mg total) by mouth daily. 90 tablet 1   Semaglutide,0.25 or 0.'5MG'$ /DOS, (OZEMPIC, 0.25 OR 0.5 MG/DOSE,) 2 MG/1.5ML SOPN Inject 0.5 mg into the skin once a week. 4.5 mL 3   Vitamin D, Ergocalciferol, (DRISDOL) 1.25 MG (50000 UNIT) CAPS capsule Take 1 capsule (50,000 Units total) by mouth every 7 (seven) days. 8 capsule 0   levETIRAcetam (KEPPRA) 500 MG tablet Take 1 tablet (500 mg total) by mouth 2 (two) times daily. 60 tablet 1   metFORMIN (GLUCOPHAGE) 500 MG tablet Take 1 tablet (500 mg total) by mouth 2 (two) times daily with a meal. 180 tablet 3   Multiple Vitamins-Minerals (ZINC PO) Take 1 tablet by mouth daily.     polyethylene glycol (MIRALAX / GLYCOLAX) 17 g packet Take 17 g by mouth daily as needed for mild constipation. 14 each 0   No facility-administered medications prior to visit.    PAST MEDICAL HISTORY: Past Medical History:  Diagnosis Date   Diabetes mellitus without complication ([place])    Hypertension     PAST SURGICAL HISTORY: Past Surgical History:  Procedure Laterality Date   APPLICATION OF CRANIAL NAVIGATION N/A 10/11/2020   Procedure: APPLICATION OF CRANIAL NAVIGATION;  Surgeon: [name], MD;  Location: [facility];  Service: Neurosurgery;  Laterality: N/A;   CRANIOTOMY  N/A 10/11/2020   Procedure: CRANIOTOMY FOR RESECTION OF MENINGIOMA;  Surgeon: [name], MD;  Location: [facility];  Service: Neurosurgery;  Laterality: N/A;   REPLACEMENT TOTAL KNEE     TONSILLECTOMY      FAMILY HISTORY: Family History  Problem Relation Age of Onset   Alcohol abuse Mother    Arthritis Mother    Stroke Mother    Alcohol abuse Father    Arthritis Father    Alcohol abuse Sister    Diabetes Sister    Alcohol abuse Sister    Arthritis Sister    Alcohol abuse Brother    Alcohol abuse Brother    Alcohol abuse Brother    Diabetes Brother    Alcohol abuse Daughter     SOCIAL HISTORY: Social History  Socioeconomic History   Marital status: Divorced    Spouse name: Not on file   Number of children: 1   Years of education: Not on file   Highest education level: Not on file  Occupational History   Occupation: retired   Occupation: Retired from [employer]   Smoking status: Unknown   Smokeless tobacco: Former    Quit date: 1970  Substance and Sexual Activity   Alcohol use: Not Currently   Drug use: No   Sexual activity: Not Currently  Other Topics Concern   Not on file  Social History Narrative   Not on file   Social Determinants of Health   Financial Resource Strain: Not on file  Food Insecurity: Not on file  Transportation Needs: Not on file  Physical Activity: Not on file  Stress: Not on file  Social Connections: Not on file  Intimate Partner Violence: Not on file    PHYSICAL EXAM  GENERAL EXAM/CONSTITUTIONAL: Vitals:  Vitals:   04/08/22 1327  BP: (!) 124/58  Pulse: 88  Weight: 247 lb (112 kg)  Height: '5\' 4"'$  (1.626 m)    Body mass index is 42.4 kg/m. Wt Readings from Last 3 Encounters:  04/08/22 247 lb (112 kg)  02/07/22 248 lb (112.5 kg)  12/27/21 248 lb 9.6 oz (112.8 kg)   Patient is in no distress; well developed, nourished and groomed; neck is supple  CARDIOVASCULAR: Examination of carotid arteries is normal; no  carotid bruits Regular rate and rhythm, no murmurs Examination of peripheral vascular system by observation and palpation is normal  EYES: Pupils round and reactive to light, Visual fields full to confrontation, Extraocular movements intacts,  No results found.  MUSCULOSKELETAL: Gait, strength, tone, movements noted in Neurologic exam below  NEUROLOGIC: MENTAL STATUS:      No data to display         awake, alert, oriented to person, place and time recent and remote memory intact normal attention and concentration language fluent, comprehension intact, naming intact fund of knowledge appropriate  CRANIAL NERVE:  2nd, 3rd, 4th, 6th - pupils equal and reactive to light, visual fields full to confrontation, extraocular muscles intact, no nystagmus 5th - facial sensation symmetric 7th - facial strength symmetric 8th - hearing intact 9th - palate elevates symmetrically, uvula midline 11th - shoulder shrug symmetric 12th - tongue protrusion midline  MOTOR:  normal bulk and tone, full strength in the BUE, BLE  SENSORY:  normal and symmetric to light touch, pinprick, temperature, vibration  COORDINATION:  finger-nose-finger, fine finger movements normal  REFLEXES:  deep tendon reflexes present and symmetric  GAIT/STATION:  normal   DIAGNOSTIC DATA (LABS, IMAGING, TESTING) - I reviewed patient records, labs, notes, testing and imaging myself where available.  Lab Results  Component Value Date   WBC 7.2 12/22/2020   HGB 15.2 (H) 12/22/2020   HCT 45.2 12/22/2020   MCV 85.1 12/22/2020   PLT 206 12/22/2020      Component Value Date/Time   NA 136 02/07/2022 1327   K 3.9 02/07/2022 1327   CL 98 02/07/2022 1327   CO2 33 (H) 02/07/2022 1327   GLUCOSE 131 (H) 02/07/2022 1327   BUN 21 02/07/2022 1327   CREATININE 1.01 02/07/2022 1327   CALCIUM 9.4 02/07/2022 1327   PROT 7.4 02/07/2022 1327   ALBUMIN 4.2 02/07/2022 1327   AST 23 02/07/2022 1327   ALT 19 02/07/2022  1327   ALKPHOS 60 02/07/2022 1327   BILITOT 0.7 02/07/2022  [place] 12/22/2020 0300   GFRAA >60 05/17/2019 0548   Lab Results  Component Value Date   CHOL 129 02/07/2022   HDL 48.20 02/07/2022   LDLCALC 65 02/07/2022   TRIG 76.0 02/07/2022   Lab Results  Component Value Date   HGBA1C 5.5 12/27/2021   No results found for: "VITAMINB12" Lab Results  Component Value Date   TSH 2.75 09/13/2021    MRI Brain 10/10/20 Meningioma in the left middle cranial fossa extending to the tentorium and cavernous sinus. This is under the left temporal lobe. No significant brain edema   MRI Brain 07/09/2021 1. Continued satisfactory postoperative appearance of the brain. Attention on follow-up directed to residual dural thickening along the floor of the left middle cranial fossa inseparable from [place]. 2. No new intracranial abnormality   Routine EEG 10/10/20 This study is suggestive of nonspecific cortical dysfunction in left temporal region.  No seizures or epileptiform discharges were seen throughout the recording.   Routine EEG 10/15/21 This is a normal EEG recording in the waking and drowsy state. No evidence of interictal epileptiform discharges seen. A normal EEG does not exclude a diagnosis of epilepsy.  I personally reviewed brain Images and previous EEG reports.   ASSESSMENT AND PLAN  71 y.o. year old female  with history of meningioma s/p resection in March 2022, seizures, hypertension hyperlipidemia diabetes mellitus who presenting to establish care for her seizures and meningioma.  Patient had a seizure in the setting of meningioma which was resected back in March 2022, since then she has been doing well, no recurrence of the tumor and no seizures.  Keppra was decrease to 500 mg BID and to 500 mg daily because she was running out of medication and no additional seizure. I have told patient since last seizure was more than 18 months ago, I think it is reasonable to  come off medication but there is still a risk of having breakthrough seizures.  She voices understanding. I will see her again in a year but I strongly advised her to contact for any seizure or seizure like activity, at that time, low threshold to restart Keppra 500 mg BID.      1. Meningioma ([place])   2. Seizure disorder [facility])      Patient Instructions  Discontinue Keppra  Continue your other medications  Contact me if you have a breakthrough seizure or any seizure like activity  Low threshold to restart Keppra 500 mg twice daily  Follow up in a year    Per [facility] statutes, patients with seizures are not allowed to drive until they have been seizure-free for six months.  Other recommendations include using caution when using heavy equipment or power tools. Avoid working on ladders or at heights. Take showers instead of baths.  Do not swim alone.  Ensure the water temperature is not too high on the home water heater. Do not go swimming alone. Do not lock yourself in a room alone (i.e. bathroom). When caring for infants or small children, sit down when holding, feeding, or changing them to minimize risk of injury to the child in the event you have a seizure. Maintain good sleep hygiene. Avoid alcohol.  Also recommend adequate sleep, hydration, good diet and minimize stress.   During the Seizure  - First, ensure adequate ventilation and place patients on the floor on their left side  Loosen clothing around the neck and ensure the airway is patent. If the  patient is clenching the teeth, do not force the mouth open with any object as this can cause severe damage - Remove all items from the surrounding that can be hazardous. The patient may be oblivious to what's happening and may not even know what he or she is doing. If the patient is confused and wandering, either gently guide him/her away and block access to outside areas - Reassure the individual and be comforting - Call 911. In  most cases, the seizure ends before EMS arrives. However, there are cases when seizures may last over 3 to 5 minutes. Or the individual may have developed breathing difficulties or severe injuries. If a pregnant patient or a person with diabetes develops a seizure, it is prudent to call an ambulance. - Finally, if the patient does not regain full consciousness, then call EMS. Most patients will remain confused for about 45 to 90 minutes after a seizure, so you must use judgment in calling for help. - Avoid restraints but make sure the patient is in a bed with padded side rails - Place the individual in a lateral position with the neck slightly flexed; this will help the saliva drain from the mouth and prevent the tongue from falling backward - Remove all nearby furniture and other hazards from the area - Provide verbal assurance as the individual is regaining consciousness - Provide the patient with privacy if possible - Call for help and start treatment as ordered by the caregiver   After the Seizure (Postictal Stage)  After a seizure, most patients experience confusion, fatigue, muscle pain and/or a headache. Thus, one should permit the individual to sleep. For the next few days, reassurance is essential. Being calm and helping reorient the person is also of importance.  Most seizures are painless and end spontaneously. Seizures are not harmful to others but can lead to complications such as stress on the lungs, brain and the heart. Individuals with prior lung problems may develop labored breathing and respiratory distress.     No orders of the defined types were placed in this encounter.   No orders of the defined types were placed in this encounter.   Return in about 1 year (around 04/09/2023).  I have spent a total of 40 minutes dedicated to this patient today, preparing to see patient, performing a medically appropriate examination and evaluation, ordering tests and/or medications and  procedures, and counseling and educating the patient/family/caregiver; independently interpreting result and communicating results to the family/patient/caregiver; and documenting clinical information in the electronic medical record.   Alric Ran, MD 04/08/2022, 3:52 PM  [facility] [address], [place], [place] [address] [phone]

## 2022-04-10 ENCOUNTER — Encounter: Payer: Self-pay | Admitting: Nurse Practitioner

## 2022-04-14 ENCOUNTER — Ambulatory Visit: Payer: Federal, State, Local not specified - PPO | Admitting: Neurology

## 2022-04-22 ENCOUNTER — Other Ambulatory Visit: Payer: Self-pay | Admitting: Nurse Practitioner

## 2022-04-22 DIAGNOSIS — I10 Essential (primary) hypertension: Secondary | ICD-10-CM

## 2022-04-22 MED ORDER — AMLODIPINE BESYLATE 10 MG PO TABS: 10.0000 mg | ORAL_TABLET | Freq: Every day | ORAL | 1 refills | Status: DC

## 2022-04-22 MED ORDER — LISINOPRIL 20 MG PO TABS: 20.0000 mg | ORAL_TABLET | Freq: Every day | ORAL | 1 refills | Status: DC

## 2022-05-06 ENCOUNTER — Ambulatory Visit: Payer: Federal, State, Local not specified - PPO | Admitting: Internal Medicine

## 2022-05-06 ENCOUNTER — Encounter: Payer: Self-pay | Admitting: Internal Medicine

## 2022-05-06 VITALS — BP 158/84 | HR 85 | Ht 63.0 in | Wt 242.8 lb

## 2022-05-06 DIAGNOSIS — E113211 Type 2 diabetes mellitus with mild nonproliferative diabetic retinopathy with macular edema, right eye: Secondary | ICD-10-CM

## 2022-05-06 DIAGNOSIS — E782 Mixed hyperlipidemia: Secondary | ICD-10-CM

## 2022-05-06 DIAGNOSIS — Z6841 Body Mass Index (BMI) 40.0 and over, adult: Secondary | ICD-10-CM

## 2022-05-06 DIAGNOSIS — Z794 Long term (current) use of insulin: Secondary | ICD-10-CM | POA: Diagnosis not present

## 2022-05-06 LAB — POCT GLYCOSYLATED HEMOGLOBIN (HGB A1C): Hemoglobin A1C: 5.6 % (ref 4.0–5.6)

## 2022-05-06 MED ORDER — BASAGLAR KWIKPEN 100 UNIT/ML ~~LOC~~ SOPN: 22.0000 [IU] | PEN_INJECTOR | Freq: Every day | SUBCUTANEOUS | 3 refills | Status: DC

## 2022-05-06 MED ORDER — NOVOLOG FLEXPEN 100 UNIT/ML ~~LOC~~ SOPN: PEN_INJECTOR | SUBCUTANEOUS | 3 refills | Status: DC

## 2022-05-06 NOTE — Addendum Note (Signed)
Addended by: Cinda Quest on: 05/06/2022 03:03 PM   Modules accepted: Orders

## 2022-05-06 NOTE — Progress Notes (Signed)
Patient ID: Regina Mckay, female   DOB: 15-May-1951, 71 y.o.   MRN: [id number]  HPI: Regina Mckay is a 71 y.o.-year-old female, initially referred by Dr. Janie Morning, returning for follow-up for DM2, dx at the end of 1990s, insulin-dependent soon after dx, uncontrolled, with complications (diabetic retinopathy, PN).  She is the sister of Regina Mckay, who is also my patient.  Last visit 4 months ago.  Interim history: No increased urination, blurry vision, nausea, chest pain. She has a history of meningioma, which was resected 1.5 years ago.  She was recently taken off [place] and now she cannot drive.   Reviewed HbA1c: Lab Results  Component Value Date   HGBA1C 5.5 12/27/2021   HGBA1C 8.1 (A) 09/13/2021   HGBA1C 13.6 (H) 10/10/2020  07/05/2021: HbA1c 7.0%; 1,5 Anhydroglucitol 11.2 (6.9-29.3)  She was previously on: - Ozempic 0.5 mg weekly >> decreased appetite, "nothing tastes good" - Novolog 70/30: 55-45-30 units before B-L-D. Adds 5-10 units depending on the sugars before meals. She was on: Metformin-glyburide 500-5 mg 3x a day, with meals. Previously on Basaglar 70 units at bedtime.  Currently on: - Ozempic 0.5 mg weekly - Basaglar 70 >> 65 >> 60 >> 34 >> 28 >> 22 units in am - Novolog 15 min before meals - only if you eat a meal: 6 to 10 units >> 6-7 units She stopped metformin due to tremors (?).  Pt checks her sugars >4x a day with her Freestyle Libre 2 CGM -checks with the receiver, as her phone is older:   Previously:    Previously:  Lowest sugar was 40s x1 >> 60s >> 68; she has hypoglycemia awareness at 100.  Highest sugar was 300s >> <180 >> 242 >> 200. She was in the emergency room with hyperglycemia in 12/2020.  - no CKD, last BUN/creatinine:  Lab Results  Component Value Date   BUN 21 02/07/2022   BUN 18 09/13/2021   CREATININE 1.01 02/07/2022   CREATININE 0.90 09/13/2021  On lisinopril 20 mg daily.  - + HL; last set of lipids: Lab Results  Component  Value Date   CHOL 129 02/07/2022   HDL 48.20 02/07/2022   LDLCALC 65 02/07/2022   TRIG 76.0 02/07/2022   CHOLHDL 3 02/07/2022  On pravastatin 40 mg daily.  - last eye exam was on 12/03/2021: Dr Clifton James >> 12/17/2021: Dr. Zigmund Daniel: macular edema - started Avastin. Had significant pain in L eye. 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    - no numbness and tingling in her feet.  Pt has FH of DM in B and S.  She has a history of HTN, meningioma, s/p resection 10/2020 - h/o seizures.  ROS: + see HPI  Past Medical History:  Diagnosis Date   Diabetes mellitus without complication ([place])    Hypertension    Past Surgical History:  Procedure Laterality Date   APPLICATION OF CRANIAL NAVIGATION N/A 10/11/2020   Procedure: APPLICATION OF CRANIAL NAVIGATION;  Surgeon: [name], MD;  Location: [facility];  Service: Neurosurgery;  Laterality: N/A;   CRANIOTOMY N/A 10/11/2020   Procedure: CRANIOTOMY FOR RESECTION OF MENINGIOMA;  Surgeon: [name], MD;  Location: [facility];  Service: Neurosurgery;  Laterality: N/A;   REPLACEMENT TOTAL KNEE     TONSILLECTOMY     Social History   Socioeconomic History   Marital status: Divorced    Spouse name: Not on file   Number of children: 1  Years of education: Not on file   Highest education level: Not on file  Occupational History   Occupation: retired   Occupation: Retired from [employer] Use   Smoking status: Unknown   Smokeless tobacco: Former    Quit date: 1970  Substance and Sexual Activity   Alcohol use: Not Currently   Drug use: No   Sexual activity: Not Currently  Other Topics Concern   Not on file  Social History Narrative   Not on file   Social Determinants of Health   Financial Resource Strain: Not on file  Food Insecurity: Not on file  Transportation Needs: Not on file  Physical Activity: Not on file  Stress: Not on file  Social Connections: Not on file  Intimate  Partner Violence: Not on file   Current Outpatient Medications on File Prior to Visit  Medication Sig Dispense Refill   acetaminophen (TYLENOL) 500 MG tablet Take by mouth.     amLODipine (NORVASC) 10 MG tablet Take 1 tablet (10 mg total) by mouth daily. 90 tablet 1   bismuth subsalicylate (PEPTO BISMOL) 262 MG chewable tablet Chew 524 mg by mouth as needed.     Continuous Blood Gluc Receiver (FREESTYLE LIBRE 2 READER) DEVI 1 each by Does not apply route daily. 1 each 0   Continuous Blood Gluc Sensor (FREESTYLE LIBRE 2 SENSOR) MISC 1 each by Does not apply route every 14 (fourteen) days. 6 each 3   Dextrose, Diabetic Use, (GLUCOSE PO) Take by mouth. Patient uses Glucose Tablets with 15 grams of carbohydrates.     hydrochlorothiazide (MICROZIDE) 12.5 MG capsule Take 1 capsule (12.5 mg total) by mouth daily. 30 capsule 0   HYDROcodone-acetaminophen (NORCO) 7.5-325 MG tablet Take 1-2 tablets by mouth every 6 (six) hours as needed.     insulin aspart (NOVOLOG FLEXPEN) 100 UNIT/ML FlexPen Inject 6-10 Units into the skin 3 (three) times daily with meals. 30 mL 3   Insulin Glargine (BASAGLAR KWIKPEN) 100 UNIT/ML Inject 28 Units into the skin daily. 30 mL 3   Insulin Pen Needle 32G X 4 MM MISC Use 4x a day 300 each 3   lidocaine (LIDODERM) 5 % PLACE 1 PATCH ONTO THE SKIN DAILY. REMOVE& DISCARD PATCH WITHIN 12 HOURS 90 patch 0   lisinopril (ZESTRIL) 20 MG tablet Take 1 tablet (20 mg total) by mouth daily. 90 tablet 1   pravastatin (PRAVACHOL) 40 MG tablet Take 1 tablet (40 mg total) by mouth daily. 90 tablet 1   Semaglutide,0.25 or 0.'5MG'$ /DOS, (OZEMPIC, 0.25 OR 0.5 MG/DOSE,) 2 MG/1.5ML SOPN Inject 0.5 mg into the skin once a week. 4.5 mL 3   Vitamin D, Ergocalciferol, (DRISDOL) 1.25 MG (50000 UNIT) CAPS capsule Take 1 capsule (50,000 Units total) by mouth every 7 (seven) days. 8 capsule 0   No current facility-administered medications on file prior to visit.   Allergies  Allergen Reactions    Amoxicillin Shortness Of Breath   Penicillins Shortness Of Breath    Did it involve swelling of the face/tongue/throat, SOB, or low BP? Yes Did it involve sudden or severe rash/hives, skin peeling, or any reaction on the inside of your mouth or nose? No Did you need to seek medical attention at a hospital or doctor's office? Yes When did it last happen?       If all above answers are "NO", may proceed with cephalosporin use.    Family History  Problem Relation Age of Onset   Alcohol abuse Mother  Arthritis Mother    Stroke Mother    Alcohol abuse Father    Arthritis Father    Alcohol abuse Sister    Diabetes Sister    Alcohol abuse Sister    Arthritis Sister    Alcohol abuse Brother    Alcohol abuse Brother    Alcohol abuse Brother    Diabetes Brother    Alcohol abuse Daughter    PE: BP (!) 158/84   Pulse 85   Ht '5\' 3"'$  (1.6 m)   Wt 242 lb 12.8 oz (110.1 kg)   SpO2 98%   BMI 43.01 kg/m  Wt Readings from Last 3 Encounters:  05/06/22 242 lb 12.8 oz (110.1 kg)  04/08/22 247 lb (112 kg)  02/07/22 248 lb (112.5 kg)   Constitutional: overweight, in NAD Eyes:  EOMI, no exophthalmos ENT: no neck masses, no cervical lymphadenopathy Cardiovascular: RRR, No MRG Respiratory: CTA B Musculoskeletal: no deformities Skin:no rashes Neurological: no tremor with outstretched hands Diabetic Foot Exam - Simple   Simple Foot Form Diabetic Foot exam was performed with the following findings: Yes 05/06/2022  2:15 PM  Visual Inspection No deformities, no ulcerations, no other skin breakdown bilaterally: Yes Sensation Testing Intact to touch and monofilament testing bilaterally: Yes Pulse Check Posterior Tibialis and Dorsalis pulse intact bilaterally: Yes Comments    ASSESSMENT: 1. DM2, insulin-dependent, uncontrolled, with complications - mild NPDR OD with macular edema  - PN  2.  Hyperlipidemia  3.  Obesity class III  PLAN:  1. Patient with longstanding, uncontrolled,  type 2 diabetes, on injectable antidiabetic regimen with basal-bolus insulin and also weekly GLP-1 receptor agonist, with improving control.  At last visit, HbA1c was 5.5%, decreased.  We decreased her Basaglar dose at that time.  I also advised her to only take the higher dose of NovoLog recommended with very large meals. CGM interpretation: -At today's visit, we reviewed her CGM downloads: It appears that 97% of values are in target range (goal >70%), while 8% are higher than 180 (goal <25%), and 0% are lower than 70 (goal <4%).  The calculated average blood sugar is 124.  The projected HbA1c for the next 3 months ([facility]MI) is 6.3%. -Reviewing the CGM trends, sugars appear to be slightly more fluctuating, but still with the vast majority of the blood sugars within the target range.  Since last visit, due to low blood sugars, we decreased her Basaglar.  No more low blood sugars afterwards.  She would be interested in stopping her NovoLog, but for now, I advised her to only take larger meals.  We can continue the same dose of Ozempic for now. - I suggested to:  Patient Instructions  Please continue: - Ozempic 0.5 mg weekly - Basaglar  22 units in am  Use: - Novolog 15 min before a larger meal (or a meal with more carbs) meals - only if you eat a meal: 5-7 units  Please return in 6 months.  - we checked her HbA1c: 5.6% (excellent) - advised to check sugars at different times of the day - 4x a day, rotating check times - advised for yearly eye exams >> she is UTD - return to clinic in 6 months  2.  Hyperlipidemia -Reviewed her latest lipid panel from 07/2021: Fractions at goal -She is on pravastatin 40 mg daily without side effects  3.  Obesity class III -We we will continue Ozempic which should also help with weight loss -She lost 5 pounds before last visit -She  lost 6 pounds since last visit  Philemon Kingdom, MD PhD [facility]

## 2022-05-06 NOTE — Patient Instructions (Addendum)
Please continue: - Ozempic 0.5 mg weekly - Basaglar  22 units in am  Use: - Novolog 15 min before a larger meal (or a meal with more carbs) meals - only if you eat a meal: 5-7 units  Please return in 6 months.

## 2022-05-26 ENCOUNTER — Other Ambulatory Visit: Payer: Self-pay | Admitting: Nurse Practitioner

## 2022-05-27 ENCOUNTER — Encounter: Payer: Self-pay | Admitting: Nurse Practitioner

## 2022-05-28 ENCOUNTER — Other Ambulatory Visit: Payer: Self-pay | Admitting: Nurse Practitioner

## 2022-05-28 DIAGNOSIS — I10 Essential (primary) hypertension: Secondary | ICD-10-CM

## 2022-05-28 MED ORDER — HYDROCHLOROTHIAZIDE 12.5 MG PO CAPS: 12.5000 mg | ORAL_CAPSULE | Freq: Every day | ORAL | 2 refills | Status: DC

## 2022-06-18 ENCOUNTER — Ambulatory Visit: Payer: Federal, State, Local not specified - PPO | Admitting: Emergency Medicine

## 2022-06-18 ENCOUNTER — Encounter: Payer: Self-pay | Admitting: Emergency Medicine

## 2022-06-18 VITALS — BP 138/84 | HR 92 | Temp 98.7°F | Ht 63.0 in | Wt 239.2 lb

## 2022-06-18 DIAGNOSIS — B349 Viral infection, unspecified: Secondary | ICD-10-CM | POA: Insufficient documentation

## 2022-06-18 DIAGNOSIS — R6889 Other general symptoms and signs: Secondary | ICD-10-CM | POA: Diagnosis not present

## 2022-06-18 LAB — POCT INFLUENZA A/B
Influenza A, POC: NEGATIVE
Influenza B, POC: NEGATIVE

## 2022-06-18 LAB — POC COVID19 BINAXNOW: SARS Coronavirus 2 Ag: NEGATIVE

## 2022-06-18 NOTE — Progress Notes (Signed)
Regina Mckay 71 y.o.   Chief Complaint  Patient presents with   Acute Visit    Cough, chills, high temp, body aches, sore throat x 2 weeks     HISTORY OF PRESENT ILLNESS: Acute problem visit today. This is a 71 y.o. female diabetic complaining of flulike symptoms that started 2 weeks ago. Mostly complaining of feeling cold and achy.  Denies cough.  Denies chest pain. No fever.  Denies nausea or vomiting.  Denies abdominal pain or diarrhea. No other associated symptoms.  HPI   Prior to Admission medications   Medication Sig Start Date End Date Taking? Authorizing Provider  acetaminophen (TYLENOL) 500 MG tablet Take by mouth.   Yes [provider]  amLODipine (NORVASC) 10 MG tablet Take 1 tablet (10 mg total) by mouth daily. 04/22/22  Yes Ailene Ards, NP  bismuth subsalicylate (PEPTO BISMOL) 262 MG chewable tablet Chew 524 mg by mouth as needed.   Yes [provider]  Continuous Blood Gluc Receiver (FREESTYLE LIBRE 2 READER) DEVI 1 each by Does not apply route daily. 10/25/21  Yes Philemon Kingdom, MD  Continuous Blood Gluc Sensor (FREESTYLE LIBRE 2 SENSOR) MISC 1 each by Does not apply route every 14 (fourteen) days. 10/25/21  Yes Philemon Kingdom, MD  Dextrose, Diabetic Use, (GLUCOSE PO) Take by mouth. Patient uses Glucose Tablets with 15 grams of carbohydrates.   Yes [provider]  hydrochlorothiazide (MICROZIDE) 12.5 MG capsule Take 1 capsule (12.5 mg total) by mouth daily. 05/28/22  Yes Ailene Ards, NP  HYDROcodone-acetaminophen (NORCO) 7.5-325 MG tablet Take 1-2 tablets by mouth every 6 (six) hours as needed. 07/13/21  Yes [provider]  insulin aspart (NOVOLOG FLEXPEN) 100 UNIT/ML FlexPen Inject 5-7 units under skin up to 2x a day 05/06/22  Yes Philemon Kingdom, MD  Insulin Glargine (BASAGLAR KWIKPEN) 100 UNIT/ML Inject 22 Units into the skin daily. 05/06/22  Yes Philemon Kingdom, MD  Insulin Pen Needle 32G X 4 MM MISC Use 4x a day  09/13/21  Yes Philemon Kingdom, MD  lidocaine (LIDODERM) 5 % PLACE 1 PATCH ONTO THE SKIN DAILY. REMOVE& DISCARD PATCH WITHIN 12 HOURS 08/22/21  Yes Mcarthur Rossetti, MD  lisinopril (ZESTRIL) 20 MG tablet Take 1 tablet (20 mg total) by mouth daily. 04/22/22  Yes Ailene Ards, NP  pravastatin (PRAVACHOL) 40 MG tablet Take 1 tablet (40 mg total) by mouth daily. 02/20/22  Yes Ailene Ards, NP  Semaglutide,0.25 or 0.'5MG'$ /DOS, (OZEMPIC, 0.25 OR 0.5 MG/DOSE,) 2 MG/1.5ML SOPN Inject 0.5 mg into the skin once a week. 02/06/22  Yes Philemon Kingdom, MD  Vitamin D, Ergocalciferol, (DRISDOL) 1.25 MG (50000 UNIT) CAPS capsule Take 1 capsule (50,000 Units total) by mouth every 7 (seven) days. 10/04/21  Yes Alric Ran, MD    Allergies  Allergen Reactions   Amoxicillin Shortness Of Breath   Penicillins Shortness Of Breath    Did it involve swelling of the face/tongue/throat, SOB, or low BP? Yes Did it involve sudden or severe rash/hives, skin peeling, or any reaction on the inside of your mouth or nose? No Did you need to seek medical attention at a hospital or doctor's office? Yes When did it last happen?       If all above answers are "NO", may proceed with cephalosporin use.     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, with long-term current use of insulin ([place]) 10/09/2020   Polycythemia 10/09/2020   Hypertensive urgency 10/09/2020   Meningioma ([place])    Mixed hyperlipidemia 01/24/2016   Hypertension 01/24/2016   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    Past Medical History:  Diagnosis Date   Diabetes mellitus without complication ([place])    Hypertension     Past Surgical History:  Procedure Laterality Date   APPLICATION OF CRANIAL NAVIGATION N/A 10/11/2020   Procedure: APPLICATION OF CRANIAL NAVIGATION;  Surgeon: [name], MD;  Location: [place];  Service: Neurosurgery;  Laterality: N/A;   CRANIOTOMY N/A 10/11/2020   Procedure: CRANIOTOMY FOR RESECTION OF MENINGIOMA;  Surgeon: [name], MD;  Location: [place];  Service: Neurosurgery;  Laterality: N/A;   REPLACEMENT TOTAL KNEE     TONSILLECTOMY      Social History   Socioeconomic History   Marital status: Divorced    Spouse name: Not on file   Number of children: 1   Years of education: Not on file   Highest education level: Not on file  Occupational History   Occupation: retired   Occupation: Retired from [employer]   Smoking status: Unknown   Smokeless tobacco: Former    Quit date: 1970  Substance and Sexual Activity   Alcohol use: Not Currently   Drug use: No   Sexual activity: Not Currently  Other Topics Concern   Not on file  Social History Narrative   Not on file   Social Determinants of Health   Financial Resource Strain: Not on file  Food Insecurity: Not on file  Transportation Needs: Not on file  Physical Activity: Not on file  Stress: Not on file  Social Connections: Not on file  Intimate Partner Violence: Not on file    Family History  Problem Relation Age of Onset   Alcohol abuse Mother    Arthritis Mother    Stroke Mother    Alcohol abuse Father    Arthritis Father    Alcohol abuse Sister    Diabetes Sister    Alcohol abuse Sister    Arthritis Sister    Alcohol abuse Brother    Alcohol abuse Brother    Alcohol abuse Brother    Diabetes Brother    Alcohol abuse Daughter      Review of Systems  Constitutional:  Positive for malaise/fatigue. Negative for chills and fever.  HENT:   Positive for congestion. Negative for sore throat.   Respiratory:  Negative for cough and shortness of breath.   Cardiovascular: Negative.  Negative for chest pain and palpitations.  Gastrointestinal: Negative.  Negative for abdominal pain, nausea and vomiting.  Musculoskeletal:  Positive for myalgias.  Neurological: Negative.  Negative for dizziness and headaches.   Today's Vitals   06/18/22 1432  BP: 138/84  Pulse: 92  Temp: 98.7 F (37.1 C)  TempSrc: Oral  SpO2: 95%  Weight: 239 lb 4 oz (108.5 kg)  Height: '5\' 3"'$  (1.6 m)   Body mass index is 42.38 kg/m.   Physical Exam Vitals reviewed.  Constitutional:      Appearance: Normal appearance.  HENT:  Head: Normocephalic.  Eyes:     Extraocular Movements: Extraocular movements intact.     Pupils: Pupils are equal, round, and reactive to light.  Cardiovascular:     Rate and Rhythm: Normal rate and regular rhythm.     Pulses: Normal pulses.     Heart sounds: Normal heart sounds.  Pulmonary:     Effort: Pulmonary effort is normal.     Breath sounds: Normal breath sounds.  Abdominal:     Palpations: Abdomen is soft.     Tenderness: There is no abdominal tenderness.  Musculoskeletal:     Cervical back: No tenderness.  Lymphadenopathy:     Cervical: No cervical adenopathy.  Skin:    General: Skin is warm and dry.  Neurological:     General: No focal deficit present.     Mental Status: She is alert and oriented to person, place, and time.  Psychiatric:        Mood and Affect: Mood normal.        Behavior: Behavior normal.    Results for orders placed or performed in visit on 06/18/22 (from the past 24 hour(s))  POC COVID-19     Status: Normal   Collection Time: 06/18/22  2:58 PM  Result Value Ref Range   SARS Coronavirus 2 Ag Negative Negative  POCT Influenza A/B     Status: Normal   Collection Time: 06/18/22  2:58 PM  Result Value Ref Range   Influenza A, POC Negative Negative   Influenza B, POC Negative  Negative      ASSESSMENT & PLAN: A total of 42 minutes was spent with the patient and counseling/coordination of care regarding preparing for this visit, review of most recent office visit notes and available medical records, review of multiple chronic medical problems and their management, review of all medications, diagnosis of viral illness and management, prognosis, pain management, education on nutrition, documentation and need for follow-up.  Problem List Items Addressed This Visit       Other   Flu-like symptoms    Secondary to viral illness. Advised to rest and stay well-hydrated. Tylenol for pain management. Advised to contact the office if no better or worse during the next several days.      Relevant Orders   POC COVID-19 (Completed)   POCT Influenza A/B (Completed)   Viral illness - Primary    2 weeks into viral illness.  Running its course.  No complications. Advised to rest and stay well-hydrated. Advised to contact the office if no better or worse during the next several days.      Patient Instructions  Viral Illness, Adult Viruses are tiny germs that can get into a person's body and cause illness. There are many different types of viruses, and they cause many types of illness. Viral illnesses can range from mild to severe. They can affect various parts of the body. Short-term conditions that are caused by a virus include colds and the flu (influenza). Long-term conditions that are caused by a virus include herpes, shingles, and HIV (human immunodeficiency virus) infection. A few viruses have been linked to certain cancers. What are the causes? Many types of viruses can cause illness. Viruses invade cells in your body, multiply, and cause the infected cells to work abnormally or die. When these cells die, they release more of the virus. When this happens, you develop symptoms of the illness, and the virus continues to spread to other cells. If the virus takes over the  function of the cell, it can cause the cell to divide and grow out of control. This happens when a virus causes cancer. Different viruses get into the body in different ways. You can get a virus by: Swallowing food or water that has come in contact with the virus (is contaminated). Breathing in droplets that have been coughed or sneezed into the air by an infected person. Touching a surface that has been contaminated with the virus and then touching your eyes, nose, or mouth. Being bitten by an insect or animal that carries the virus. Having sexual contact with a person who is infected with the virus. Being exposed to blood or fluids that contain the virus, either through an open cut or during a transfusion. If a virus enters your body, your body's defense system (immune system) will try to fight the virus. You may be at higher risk for a viral illness if your immune system is weak. What are the signs or symptoms? You may have these symptoms, depending on the type of virus and the location of the cells that it invades: Cold and flu viruses: Fever. Headache. Sore throat. Muscle aches. Stuffy nose (nasal congestion). Cough. Digestive system (gastrointestinal) viruses: Fever. Pain in the abdomen. Nausea. Diarrhea. Liver viruses (hepatitis): Loss of appetite. Tiredness. Skin or the white parts of your eyes turning yellow (jaundice). Brain and spinal cord viruses: Fever. Headache. Stiff neck. Nausea and vomiting. Confusion or sleepiness. Skin viruses: Warts. Itching. Rash. Sexually transmitted viruses: Discharge. Swelling. Redness. Rash. How is this diagnosed? This condition may be diagnosed based on one or more of the following: Symptoms. Medical history. Physical exam. Blood test, sample of mucus from your lungs (sputum sample), stool sample, or a swab of body fluids or a skin sore (lesion). How is this treated? Viruses can be hard to treat because they live within  cells. Antibiotic medicines do not treat viruses because these medicines do not get inside cells. Treatment for a viral illness may include: Resting and drinking plenty of fluids. Medicines to relieve symptoms. These can include over-the-counter medicine for pain and fever, medicines for cough or congestion, and medicines to relieve diarrhea. Antiviral medicines. These medicines are available only for certain types of viruses. Some viral illnesses can be prevented with vaccinations. A common example is the flu shot. Follow these instructions at home: Medicines Take over-the-counter and prescription medicines only as told by your health care provider. If you were prescribed an antiviral medicine, take it as told by your health care provider. Do not stop taking the antiviral even if you start to feel better. Be aware of when antibiotics are needed and when they are not needed. Antibiotics do not treat viruses. You may get an antibiotic if your health care provider thinks that you may have, or are at risk for, a bacterial infection and you have a viral infection. Do not ask for an antibiotic prescription if you have been diagnosed with a viral illness. Antibiotics will not make your illness go away faster. Frequently taking antibiotics when they are not needed can lead to antibiotic resistance. When this develops, the medicine no longer works against the bacteria that it normally fights. General instructions  Drink enough fluids to keep your urine pale yellow. Rest as much as possible. Return to your normal activities as told by your health care provider. Ask your health care provider what activities are safe for you. Keep all follow-up visits as told by your health care provider. This is important. How  is this prevented? To reduce your risk of viral illness: Wash your hands often with soap and water for at least 20 seconds. If soap and water are not available, use hand sanitizer. Avoid touching  your nose, eyes, and mouth, especially if you have not washed your hands recently. If anyone in your household has a viral infection, clean all household surfaces that may have been in contact with the virus. Use soap and hot water. You may also use bleach that you have added water to (diluted). Stay away from people who are sick with symptoms of a viral infection. Do not share items such as toothbrushes and water bottles with other people. Keep your vaccinations up to date. This includes getting a yearly flu shot. Eat a healthy diet and get plenty of rest. Contact a health care provider if: You have symptoms of a viral illness that do not go away. Your symptoms come back after going away. Your symptoms get worse. Get help right away if you have: Trouble breathing. A severe headache or a stiff neck. Severe vomiting or pain in your abdomen. These symptoms may represent a serious problem that is an emergency. Do not wait to see if the symptoms will go away. Get medical help right away. Call your local emergency services (911 in the [place] Do not drive yourself to the hospital. Summary Viruses are types of germs that can get into a person's body and cause illness. Viral illnesses can range from mild to severe. They can affect various parts of the body. Viruses can be hard to treat. There are medicines to relieve symptoms, and there are some antiviral medicines. If you were prescribed an antiviral medicine, take it as told by your health care provider. Do not stop taking the antiviral even if you start to feel better. Contact a health care provider if you have symptoms of a viral illness that do not go away. This information is not intended to replace advice given to you by your health care provider. Make sure you discuss any questions you have with your health care provider. Document Revised: 12/05/2019 Document Reviewed: 05/31/2019 Elsevier Patient Education  Rosita, MD [facility]

## 2022-06-18 NOTE — Patient Instructions (Signed)

## 2022-06-18 NOTE — Assessment & Plan Note (Signed)
2 weeks into viral illness.  Running its course.  No complications. Advised to rest and stay well-hydrated. Advised to contact the office if no better or worse during the next several days.

## 2022-06-18 NOTE — Assessment & Plan Note (Signed)
Secondary to viral illness. Advised to rest and stay well-hydrated. Tylenol for pain management. Advised to contact the office if no better or worse during the next several days.

## 2022-08-14 ENCOUNTER — Ambulatory Visit: Payer: Federal, State, Local not specified - PPO | Admitting: Nurse Practitioner

## 2022-08-14 VITALS — BP 136/72 | HR 81 | Temp 97.6°F | Ht 63.0 in | Wt 239.4 lb

## 2022-08-14 DIAGNOSIS — E559 Vitamin D deficiency, unspecified: Secondary | ICD-10-CM

## 2022-08-14 DIAGNOSIS — I10 Essential (primary) hypertension: Secondary | ICD-10-CM

## 2022-08-14 DIAGNOSIS — E113211 Type 2 diabetes mellitus with mild nonproliferative diabetic retinopathy with macular edema, right eye: Secondary | ICD-10-CM | POA: Diagnosis not present

## 2022-08-14 DIAGNOSIS — Z794 Long term (current) use of insulin: Secondary | ICD-10-CM

## 2022-08-14 LAB — BASIC METABOLIC PANEL
BUN: 18 mg/dL (ref 6–23)
CO2: 31 mEq/L (ref 19–32)
Calcium: 9.7 mg/dL (ref 8.4–10.5)
Chloride: 99 mEq/L (ref 96–112)
Creatinine, Ser: 0.9 mg/dL (ref 0.40–1.20)
GFR: 63.47 mL/min (ref >60.00)
Glucose, Bld: 118 mg/dL — ABNORMAL HIGH (ref 70–99)
Potassium: 4.5 mEq/L (ref 3.5–5.1)
Sodium: 139 mEq/L (ref 135–145)

## 2022-08-14 LAB — VITAMIN D 25 HYDROXY (VIT D DEFICIENCY, FRACTURES): VITD: 38.27 ng/mL (ref 30.00–100.00)

## 2022-08-14 NOTE — Assessment & Plan Note (Signed)
On vitamin d3 400 IUs, daily. Started last time I saw her. Will recheck serum level today, Further recommendations may be made based on these results.

## 2022-08-14 NOTE — Assessment & Plan Note (Signed)
Chronic, acceptable on current regimen. Continue amlodipine '10mg'$ /day, HCTZ 12.'5mg'$ /day, and lisinopril '20mg'$ /day.

## 2022-08-14 NOTE — Assessment & Plan Note (Signed)
Chronic, last A1c well-controlled on current regimen.  Encouraged her to follow-up with endocrinology as well as to follow-up with ophthalmology for diabetic eye exam when this is due.  Patient reports understanding.

## 2022-08-14 NOTE — Progress Notes (Signed)
Established Patient Office Visit  Subjective   Patient ID: Regina Mckay, female    DOB: 01/25/1951  Age: 72 y.o. MRN: [id number]  No chief complaint on file.   Hypertension/hyperlipidemia: Tolerating medication well takes amlodipine 10 mg/day hydrochlorothiazide 12.5 mg/day and lisinopril 20 mg daily.  On pravastatin 40 mg daily.  Last LDL 65 in 02/2022.  Continues to follow with endocrinology for type 2 diabetes.  Last A1c 5.6.  Reports that she will be due for diabetic eye exam this upcoming May.    Review of Systems  Eyes:  Positive for blurred vision.  Respiratory:  Negative for shortness of breath.   Cardiovascular:  Negative for chest pain.  Neurological:  Negative for dizziness.      Objective:     BP 136/72   Pulse 81   Temp 97.6 F (36.4 C) (Temporal)   Ht '5\' 3"'$  (1.6 m)   Wt 239 lb 6 oz (108.6 kg)   SpO2 97%   BMI 42.40 kg/m  BP Readings from Last 3 Encounters:  08/14/22 136/72  06/18/22 138/84  05/06/22 (!) 158/84   Wt Readings from Last 3 Encounters:  08/14/22 239 lb 6 oz (108.6 kg)  06/18/22 239 lb 4 oz (108.5 kg)  05/06/22 242 lb 12.8 oz (110.1 kg)      Physical Exam Vitals reviewed.  Constitutional:      General: She is not in acute distress.    Appearance: Normal appearance.  HENT:     Head: Normocephalic and atraumatic.  Neck:     Vascular: No carotid bruit.  Cardiovascular:     Rate and Rhythm: Normal rate and regular rhythm.     Pulses: Normal pulses.     Heart sounds: Normal heart sounds.  Pulmonary:     Effort: Pulmonary effort is normal.     Breath sounds: Normal breath sounds.  Skin:    General: Skin is warm and dry.  Neurological:     General: No focal deficit present.     Mental Status: She is alert and oriented to person, place, and time.  Psychiatric:        Mood and Affect: Mood normal.        Behavior: Behavior normal.        Judgment: Judgment normal.      No results found for any visits on 08/14/22.    The  ASCVD Risk score (Arnett DK, et al., 2019) failed to calculate for the following reasons:   The valid total cholesterol range is 130 to 320 mg/dL   The smoking status is invalid    Assessment & Plan:   Problem List Items Addressed This Visit       Cardiovascular and Mediastinum   Hypertension    Chronic, acceptable on current regimen. Continue amlodipine '10mg'$ /day, HCTZ 12.'5mg'$ /day, and lisinopril '20mg'$ /day.        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  Encouraged her to follow-up with endocrinology as well as to follow-up with ophthalmology for diabetic eye exam when this is due.  Patient reports understanding.        Other   Vitamin D deficiency - Primary    On vitamin d3 400 IUs, daily. Started last time I saw her. Will recheck serum level today, Further recommendations may be made based on these results.  Relevant Orders   VITAMIN D 25 Hydroxy (Vit-D Deficiency, Fractures)   Basic metabolic panel    Return in about 6 months (around 02/12/2023) for f/u with Ramond Darnell.    Ailene Ards, NP

## 2022-08-18 ENCOUNTER — Other Ambulatory Visit: Payer: Self-pay | Admitting: Nurse Practitioner

## 2022-08-18 DIAGNOSIS — Z794 Long term (current) use of insulin: Secondary | ICD-10-CM

## 2022-08-24 ENCOUNTER — Encounter: Payer: Self-pay | Admitting: Nurse Practitioner

## 2022-08-25 ENCOUNTER — Other Ambulatory Visit: Payer: Self-pay

## 2022-08-25 DIAGNOSIS — E113211 Type 2 diabetes mellitus with mild nonproliferative diabetic retinopathy with macular edema, right eye: Secondary | ICD-10-CM

## 2022-08-25 MED ORDER — PRAVASTATIN SODIUM 40 MG PO TABS: 40.0000 mg | ORAL_TABLET | Freq: Every day | ORAL | 1 refills | Status: DC

## 2022-09-08 ENCOUNTER — Encounter (INDEPENDENT_AMBULATORY_CARE_PROVIDER_SITE_OTHER): Payer: Federal, State, Local not specified - PPO | Admitting: Internal Medicine

## 2022-09-08 DIAGNOSIS — E113211 Type 2 diabetes mellitus with mild nonproliferative diabetic retinopathy with macular edema, right eye: Secondary | ICD-10-CM

## 2022-09-08 DIAGNOSIS — Z794 Long term (current) use of insulin: Secondary | ICD-10-CM | POA: Diagnosis not present

## 2022-09-09 NOTE — Telephone Encounter (Signed)
Please see the MyChart message reply(ies) for my assessment and plan.    This patient gave consent for this Medical Advice Message and is aware that it may result in a bill to [employer], as well as the possibility of receiving a bill for a co-payment or deductible. They are an established patient, but are not seeking medical advice exclusively about a problem treated during an in person or video visit in the last seven days. I did not recommend an in person or video visit within seven days of my reply.    I spent a total of 7 minutes cumulative time within 7 days through [employer].  Philemon Kingdom, MD

## 2022-09-30 ENCOUNTER — Other Ambulatory Visit: Payer: Self-pay | Admitting: Internal Medicine

## 2022-09-30 ENCOUNTER — Encounter: Payer: Self-pay | Admitting: Internal Medicine

## 2022-11-06 ENCOUNTER — Encounter: Payer: Self-pay | Admitting: Internal Medicine

## 2022-11-06 ENCOUNTER — Ambulatory Visit: Payer: Federal, State, Local not specified - PPO | Admitting: Internal Medicine

## 2022-11-06 ENCOUNTER — Other Ambulatory Visit: Payer: Self-pay | Admitting: Nurse Practitioner

## 2022-11-06 VITALS — BP 132/80 | HR 94 | Ht 63.0 in | Wt 230.6 lb

## 2022-11-06 DIAGNOSIS — Z794 Long term (current) use of insulin: Secondary | ICD-10-CM | POA: Diagnosis not present

## 2022-11-06 DIAGNOSIS — I10 Essential (primary) hypertension: Secondary | ICD-10-CM

## 2022-11-06 DIAGNOSIS — E782 Mixed hyperlipidemia: Secondary | ICD-10-CM

## 2022-11-06 DIAGNOSIS — E113211 Type 2 diabetes mellitus with mild nonproliferative diabetic retinopathy with macular edema, right eye: Secondary | ICD-10-CM | POA: Diagnosis not present

## 2022-11-06 DIAGNOSIS — Z6841 Body Mass Index (BMI) 40.0 and over, adult: Secondary | ICD-10-CM

## 2022-11-06 LAB — POCT GLYCOSYLATED HEMOGLOBIN (HGB A1C): Hemoglobin A1C: 5.8 % — AB (ref 4.0–5.6)

## 2022-11-06 MED ORDER — SEMAGLUTIDE(0.25 OR 0.5MG/DOS) 2 MG/3ML ~~LOC~~ SOPN: 0.2500 mg | PEN_INJECTOR | SUBCUTANEOUS | 3 refills | Status: DC

## 2022-11-06 NOTE — Patient Instructions (Addendum)
Please reduce: - Ozempic 0.25 mg weekly  Continue: - Basaglar  22 units in am  Increase: - Novolog 15 min before meals: 7-9 units  Please return in 6 months.

## 2022-11-06 NOTE — Progress Notes (Signed)
Patient ID: Regina Mckay, female   DOB: 11-May-1951, 72 y.o.   MRN: [id number]  HPI: Regina Mckay is a 72 y.o.-year-old female, initially referred by Dr. Janie Mckay, returning for follow-up for DM2, dx at the end of 1990s, insulin-dependent soon after dx, uncontrolled, with complications (diabetic retinopathy, PN).  She is the sister of Regina Mckay, who is also my patient.  Last visit 6 months ago.  Interim history: No increased urination, blurry vision, chest pain. She has constipation and occasional nausea especially if she does not have a bowel movement.  She would be interested to reduce her Ozempic dose due to this.  Reviewed HbA1c: Lab Results  Component Value Date   HGBA1C 5.6 05/06/2022   HGBA1C 5.5 12/27/2021   HGBA1C 8.1 (A) 09/13/2021   HGBA1C 13.6 (H) 10/10/2020  07/05/2021: HbA1c 7.0%; 1,5 Anhydroglucitol 11.2 (6.9-29.3)  She was previously on: - Ozempic 0.5 mg weekly >> decreased appetite, "nothing tastes good" - Novolog 70/30: 55-45-30 units before B-L-D. Adds 5-10 units depending on the sugars before meals. She was on: Metformin-glyburide 500-5 mg 3x a day, with meals. Previously on Basaglar 70 units at bedtime.  Currently on: - Ozempic 0.5 mg weekly - Basaglar 70 >> 65 >> 60 >> 34 >> 28 >> 22 units in am - Novolog 15 min before meals - only if you eat a meal: 6 to 10 units >> 6-7 units She stopped metformin due to tremors (?).  Pt checks her sugars >4x a day with her Freestyle Libre 2 CGM -checks with the receiver, as her phone is older:  Previously:   Previously:   Lowest sugar was 40s x1 >> 60s >> 68  >> 60s; she has hypoglycemia awareness at 100.  Highest sugar was 300s >> <180 >> 242 >> 200 >> 200. She was in the emergency room with hyperglycemia in 12/2020.  - no CKD, last BUN/creatinine:  Lab Results  Component Value Date   BUN 18 08/14/2022   BUN 21 02/07/2022   CREATININE 0.90 08/14/2022   CREATININE 1.01 02/07/2022  On lisinopril 20 mg  daily.  - + HL; last set of lipids: Lab Results  Component Value Date   CHOL 129 02/07/2022   HDL 48.20 02/07/2022   LDLCALC 65 02/07/2022   TRIG 76.0 02/07/2022   CHOLHDL 3 02/07/2022  On pravastatin 40 mg daily.  - last eye exam was on 12/03/2021: Dr Regina Mckay >> 12/17/2021: Dr. Zigmund Mckay: macular edema - started Avastin. Had significant pain in L eye. 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    - no numbness and tingling in her feet.  Last foot exam 05/06/2022.  Pt has FH of DM in B and S.  She has a history of HTN, meningioma, s/p resection 10/2020 - h/o seizures.  ROS: + see HPI  Past Medical History:  Diagnosis Date   Diabetes mellitus without complication ([place])    Hypertension    Past Surgical History:  Procedure Laterality Date   APPLICATION OF CRANIAL NAVIGATION N/A 10/11/2020   Procedure: APPLICATION OF CRANIAL NAVIGATION;  Surgeon: Regina Mare, MD;  Location: [facility];  Service: Neurosurgery;  Laterality: N/A;   CRANIOTOMY N/A 10/11/2020   Procedure: CRANIOTOMY FOR RESECTION OF MENINGIOMA;  Surgeon: Regina Mare, MD;  Location: [facility];  Service: Neurosurgery;  Laterality: N/A;   REPLACEMENT TOTAL KNEE     TONSILLECTOMY     Social History   Socioeconomic History   Marital status: Divorced  Spouse name: Not on file   Number of children: 1   Years of education: Not on file   Highest education level: Not on file  Occupational History   Occupation: retired   Occupation: Retired from [employer] Use   Smoking status: Unknown   Smokeless tobacco: Former    Quit date: 1970  Substance and Sexual Activity   Alcohol use: Not Currently   Drug use: No   Sexual activity: Not Currently  Other Topics Concern   Not on file  Social History Narrative   Not on file   Social Determinants of Health   Financial Resource Strain: Not on file  Food Insecurity: Not on file  Transportation Needs: Not on file  Physical  Activity: Not on file  Stress: Not on file  Social Connections: Not on file  Intimate Partner Violence: Not on file   Current Outpatient Medications on File Prior to Visit  Medication Sig Dispense Refill   acetaminophen (TYLENOL) 500 MG tablet Take by mouth.     amLODipine (NORVASC) 10 MG tablet Take 1 tablet (10 mg total) by mouth daily. 90 tablet 1   bismuth subsalicylate (PEPTO BISMOL) 262 MG chewable tablet Chew 524 mg by mouth as needed.     cholecalciferol (VITAMIN D3) 10 MCG (400 UNIT) TABS tablet Take 400 Units by mouth.     Continuous Blood Gluc Receiver (FREESTYLE LIBRE 2 READER) DEVI 1 each by Does not apply route daily. 1 each 0   Continuous Blood Gluc Sensor (FREESTYLE LIBRE 2 SENSOR) MISC USE 1 SENSOR EVERY 14 DAYS AS DIRECTED 6 each 1   Dextrose, Diabetic Use, (GLUCOSE PO) Take by mouth. Patient uses Glucose Tablets with 15 grams of carbohydrates.     hydrochlorothiazide (MICROZIDE) 12.5 MG capsule Take 1 capsule (12.5 mg total) by mouth daily. 90 capsule 2   HYDROcodone-acetaminophen (NORCO) 7.5-325 MG tablet Take 1-2 tablets by mouth every 6 (six) hours as needed.     insulin aspart (NOVOLOG FLEXPEN) 100 UNIT/ML FlexPen Inject 5-7 units under skin up to 2x a day 30 mL 3   Insulin Glargine (BASAGLAR KWIKPEN) 100 UNIT/ML Inject 22 Units into the skin daily. 30 mL 3   Insulin Pen Needle 32G X 4 MM MISC Use 4x a day 300 each 3   lidocaine (LIDODERM) 5 % PLACE 1 PATCH ONTO THE SKIN DAILY. REMOVE& DISCARD PATCH WITHIN 12 HOURS 90 patch 0   lisinopril (ZESTRIL) 20 MG tablet Take 1 tablet (20 mg total) by mouth daily. 90 tablet 1   pravastatin (PRAVACHOL) 40 MG tablet Take 1 tablet (40 mg total) by mouth daily. 90 tablet 1   Semaglutide,0.25 or 0.5MG /DOS, (OZEMPIC, 0.25 OR 0.5 MG/DOSE,) 2 MG/1.5ML SOPN Inject 0.5 mg into the skin once a week. 4.5 mL 3   No current facility-administered medications on file prior to visit.   Allergies  Allergen Reactions   Amoxicillin Shortness Of  Breath   Penicillins Shortness Of Breath    Did it involve swelling of the face/tongue/throat, SOB, or low BP? Yes Did it involve sudden or severe rash/hives, skin peeling, or any reaction on the inside of your mouth or nose? No Did you need to seek medical attention at a hospital or doctor's office? Yes When did it last happen?       If all above answers are "NO", may proceed with cephalosporin use.    Family History  Problem Relation Age of Onset   Alcohol abuse Mother  Arthritis Mother    Stroke Mother    Alcohol abuse Father    Arthritis Father    Alcohol abuse Sister    Diabetes Sister    Alcohol abuse Sister    Arthritis Sister    Alcohol abuse Brother    Alcohol abuse Brother    Alcohol abuse Brother    Diabetes Brother    Alcohol abuse Daughter    PE: BP 132/80 (BP Location: Left Arm, Patient Position: Sitting, Cuff Size: Normal)   Pulse 94   Ht 5\' 3"  (1.6 m)   Wt 230 lb 9.6 oz (104.6 kg)   SpO2 99%   BMI 40.85 kg/m  Wt Readings from Last 3 Encounters:  11/06/22 230 lb 9.6 oz (104.6 kg)  08/14/22 239 lb 6 oz (108.6 kg)  06/18/22 239 lb 4 oz (108.5 kg)   Constitutional: overweight, in NAD Eyes:  EOMI, no exophthalmos ENT: no neck masses, no cervical lymphadenopathy Cardiovascular: RRR, No MRG Respiratory: CTA B Musculoskeletal: no deformities Skin:no rashes Neurological: no tremor with outstretched hands  ASSESSMENT: 1. DM2, insulin-dependent, uncontrolled, with complications - mild NPDR OD with macular edema  - PN  2.  Hyperlipidemia  3.  Obesity class III  PLAN:  1. Patient with longstanding, previously uncontrolled, type 2 diabetes, on injectable antidiabetic regimen with weekly GLP-1 receptor agonist and basal/bolus insulin regimen, with excellent control lately.  At last visit, HbA1c was 5.6%.  Sugars appears to be slightly more fluctuating at that time but the vast majority of the blood sugars are still within the target range.  She did not  have any more low blood sugars at that time after decreasing the Basaglar before the visit.  We did not change the regimen.  However, since then, she again developed low when she was sick and we discussed about possibly decreasing the basal insulin further. CGM interpretation: -At today's visit, we reviewed her CGM downloads: It appears that 93% of values are in target range (goal >70%), while 7% are higher than 180 (goal <25%), and 0% are lower than 70 (goal <4%).  The calculated average blood sugar is 132.  The projected HbA1c for the next 3 months (GMI) is 6.5%. -Reviewing the CGM trends, sugars are excellent except for slightly higher peaks after lunch and dinner.  Upon questioning, she is taking 6 to 7 units of NovoLog before these meals.  At today's visit, she tells me she would like to reduce the dose of Ozempic if possible due to constipation.  We did discuss about other options to manage her constipation but we will also try to reduce the dose to 0.25 mg weekly (I advised her that she could always go back to 0.5 mg weekly in the future) and in the meantime I advised her to increase her NovoLog dose slightly before meals.  Will continue the same dose of Basaglar. - I suggested to:  Patient Instructions  Please reduce: - Ozempic 0.25 mg weekly  Continue: - Basaglar  22 units in am  Increase: - Novolog 15 min before meals: 7-9 units  Please return in 6 months.  - we checked her HbA1c: 5.8% (excellent, only slightly higher) - advised to check sugars at different times of the day - 4x a day, rotating check times - advised for yearly eye exams >> she is UTD - return to clinic in 6 months  2.  Hyperlipidemia - Reviewed latest lipid panel from 02/2022: Fractions at goal: Lab Results  Component Value Date  CHOL 129 02/07/2022   HDL 48.20 02/07/2022   LDLCALC 65 02/07/2022   TRIG 76.0 02/07/2022   CHOLHDL 3 02/07/2022  - Continues pravastatin 40 mg daily without side effects.  3.   Obesity class III -Will continue Ozempic but unfortunately we need to decrease the dose due to constipation -She lost 9 pounds since last visit  Philemon Kingdom, MD PhD [facility]

## 2022-11-07 ENCOUNTER — Other Ambulatory Visit: Payer: Self-pay | Admitting: Nurse Practitioner

## 2022-11-07 DIAGNOSIS — Z1231 Encounter for screening mammogram for malignant neoplasm of breast: Secondary | ICD-10-CM

## 2022-11-17 ENCOUNTER — Other Ambulatory Visit: Payer: Self-pay | Admitting: Nurse Practitioner

## 2022-11-17 DIAGNOSIS — I10 Essential (primary) hypertension: Secondary | ICD-10-CM

## 2022-12-20 ENCOUNTER — Other Ambulatory Visit: Payer: Self-pay

## 2022-12-20 ENCOUNTER — Emergency Department (HOSPITAL_COMMUNITY): Payer: Medicare Other

## 2022-12-20 ENCOUNTER — Encounter (HOSPITAL_COMMUNITY): Payer: Self-pay | Admitting: *Deleted

## 2022-12-20 ENCOUNTER — Inpatient Hospital Stay (HOSPITAL_COMMUNITY)
Admission: EM | Admit: 2022-12-20 | Discharge: 2022-12-24 | DRG: 872 | Disposition: A | Discharge status: Home / Self Care | Payer: Medicare Other | Attending: Internal Medicine | Admitting: Internal Medicine

## 2022-12-20 DIAGNOSIS — I10 Essential (primary) hypertension: Secondary | ICD-10-CM | POA: Diagnosis present

## 2022-12-20 DIAGNOSIS — E785 Hyperlipidemia, unspecified: Secondary | ICD-10-CM | POA: Diagnosis present

## 2022-12-20 DIAGNOSIS — K769 Liver disease, unspecified: Secondary | ICD-10-CM | POA: Diagnosis present

## 2022-12-20 DIAGNOSIS — Z6841 Body Mass Index (BMI) 40.0 and over, adult: Secondary | ICD-10-CM

## 2022-12-20 DIAGNOSIS — C7800 Secondary malignant neoplasm of unspecified lung: Secondary | ICD-10-CM | POA: Diagnosis present

## 2022-12-20 DIAGNOSIS — E041 Nontoxic single thyroid nodule: Secondary | ICD-10-CM | POA: Diagnosis present

## 2022-12-20 DIAGNOSIS — A4159 Other Gram-negative sepsis: Secondary | ICD-10-CM | POA: Diagnosis present

## 2022-12-20 DIAGNOSIS — B961 Klebsiella pneumoniae [K. pneumoniae] as the cause of diseases classified elsewhere: Secondary | ICD-10-CM | POA: Diagnosis not present

## 2022-12-20 DIAGNOSIS — E876 Hypokalemia: Secondary | ICD-10-CM

## 2022-12-20 DIAGNOSIS — R7401 Elevation of levels of liver transaminase levels: Secondary | ICD-10-CM | POA: Diagnosis present

## 2022-12-20 DIAGNOSIS — C786 Secondary malignant neoplasm of retroperitoneum and peritoneum: Secondary | ICD-10-CM | POA: Diagnosis present

## 2022-12-20 DIAGNOSIS — E1140 Type 2 diabetes mellitus with diabetic neuropathy, unspecified: Secondary | ICD-10-CM | POA: Diagnosis present

## 2022-12-20 DIAGNOSIS — Z1611 Resistance to penicillins: Secondary | ICD-10-CM | POA: Diagnosis present

## 2022-12-20 DIAGNOSIS — Z79899 Other long term (current) drug therapy: Secondary | ICD-10-CM

## 2022-12-20 DIAGNOSIS — Z8261 Family history of arthritis: Secondary | ICD-10-CM

## 2022-12-20 DIAGNOSIS — A419 Sepsis, unspecified organism: Secondary | ICD-10-CM | POA: Diagnosis not present

## 2022-12-20 DIAGNOSIS — K921 Melena: Secondary | ICD-10-CM | POA: Diagnosis present

## 2022-12-20 DIAGNOSIS — Z880 Allergy status to penicillin: Secondary | ICD-10-CM

## 2022-12-20 DIAGNOSIS — Z1152 Encounter for screening for COVID-19: Secondary | ICD-10-CM

## 2022-12-20 DIAGNOSIS — E871 Hypo-osmolality and hyponatremia: Secondary | ICD-10-CM | POA: Diagnosis present

## 2022-12-20 DIAGNOSIS — R7989 Other specified abnormal findings of blood chemistry: Secondary | ICD-10-CM | POA: Insufficient documentation

## 2022-12-20 DIAGNOSIS — N179 Acute kidney failure, unspecified: Secondary | ICD-10-CM | POA: Diagnosis present

## 2022-12-20 DIAGNOSIS — R590 Localized enlarged lymph nodes: Secondary | ICD-10-CM | POA: Diagnosis present

## 2022-12-20 DIAGNOSIS — A4189 Other specified sepsis: Secondary | ICD-10-CM

## 2022-12-20 DIAGNOSIS — E860 Dehydration: Secondary | ICD-10-CM | POA: Diagnosis present

## 2022-12-20 DIAGNOSIS — L821 Other seborrheic keratosis: Secondary | ICD-10-CM | POA: Diagnosis present

## 2022-12-20 DIAGNOSIS — R6520 Severe sepsis without septic shock: Secondary | ICD-10-CM | POA: Diagnosis present

## 2022-12-20 DIAGNOSIS — R197 Diarrhea, unspecified: Secondary | ICD-10-CM | POA: Diagnosis present

## 2022-12-20 DIAGNOSIS — K8020 Calculus of gallbladder without cholecystitis without obstruction: Secondary | ICD-10-CM | POA: Diagnosis present

## 2022-12-20 DIAGNOSIS — K30 Functional dyspepsia: Secondary | ICD-10-CM | POA: Diagnosis present

## 2022-12-20 DIAGNOSIS — R160 Hepatomegaly, not elsewhere classified: Secondary | ICD-10-CM | POA: Diagnosis present

## 2022-12-20 DIAGNOSIS — E8809 Other disorders of plasma-protein metabolism, not elsewhere classified: Secondary | ICD-10-CM | POA: Diagnosis present

## 2022-12-20 DIAGNOSIS — R112 Nausea with vomiting, unspecified: Principal | ICD-10-CM | POA: Diagnosis present

## 2022-12-20 DIAGNOSIS — Z833 Family history of diabetes mellitus: Secondary | ICD-10-CM

## 2022-12-20 DIAGNOSIS — Z823 Family history of stroke: Secondary | ICD-10-CM

## 2022-12-20 DIAGNOSIS — Z87891 Personal history of nicotine dependence: Secondary | ICD-10-CM | POA: Diagnosis not present

## 2022-12-20 DIAGNOSIS — Z86011 Personal history of benign neoplasm of the brain: Secondary | ICD-10-CM

## 2022-12-20 DIAGNOSIS — Z794 Long term (current) use of insulin: Secondary | ICD-10-CM

## 2022-12-20 DIAGNOSIS — Z7985 Long-term (current) use of injectable non-insulin antidiabetic drugs: Secondary | ICD-10-CM

## 2022-12-20 DIAGNOSIS — E113211 Type 2 diabetes mellitus with mild nonproliferative diabetic retinopathy with macular edema, right eye: Secondary | ICD-10-CM | POA: Diagnosis present

## 2022-12-20 DIAGNOSIS — K8000 Calculus of gallbladder with acute cholecystitis without obstruction: Secondary | ICD-10-CM | POA: Diagnosis present

## 2022-12-20 DIAGNOSIS — Z811 Family history of alcohol abuse and dependence: Secondary | ICD-10-CM

## 2022-12-20 LAB — COMPREHENSIVE METABOLIC PANEL
ALT: 86 U/L — ABNORMAL HIGH (ref 0–44)
AST: 65 U/L — ABNORMAL HIGH (ref 15–41)
Albumin: 3.7 g/dL (ref 3.5–5.0)
Alkaline Phosphatase: 113 U/L (ref 38–126)
Anion gap: 13 (ref 5–15)
BUN: 23 mg/dL (ref 8–23)
CO2: 24 mmol/L (ref 22–32)
Calcium: 9 mg/dL (ref 8.9–10.3)
Chloride: 94 mmol/L — ABNORMAL LOW (ref 98–111)
Creatinine, Ser: 1.23 mg/dL — ABNORMAL HIGH (ref 0.44–1.00)
GFR, Estimated: 46 mL/min — ABNORMAL LOW (ref >60)
Glucose, Bld: 201 mg/dL — ABNORMAL HIGH (ref 70–99)
Potassium: 4.4 mmol/L (ref 3.5–5.1)
Sodium: 131 mmol/L — ABNORMAL LOW (ref 135–145)
Total Bilirubin: 3.7 mg/dL — ABNORMAL HIGH (ref 0.3–1.2)
Total Protein: 7.9 g/dL (ref 6.5–8.1)

## 2022-12-20 LAB — DIFFERENTIAL
Abs Immature Granulocytes: 0.06 10*3/uL (ref 0.00–0.07)
Basophils Absolute: 0 10*3/uL (ref 0.0–0.1)
Basophils Relative: 0 %
Eosinophils Absolute: 0 10*3/uL (ref 0.0–0.5)
Eosinophils Relative: 0 %
Immature Granulocytes: 0 %
Lymphocytes Relative: 1 %
Lymphs Abs: 0.2 10*3/uL — ABNORMAL LOW (ref 0.7–4.0)
Monocytes Absolute: 0.4 10*3/uL (ref 0.1–1.0)
Monocytes Relative: 3 %
Neutro Abs: 13.8 10*3/uL — ABNORMAL HIGH (ref 1.7–7.7)
Neutrophils Relative %: 96 %

## 2022-12-20 LAB — CBC
HCT: 47.9 % — ABNORMAL HIGH (ref 36.0–46.0)
Hemoglobin: 15.8 g/dL — ABNORMAL HIGH (ref 12.0–15.0)
MCH: 29.2 pg (ref 26.0–34.0)
MCHC: 33 g/dL (ref 30.0–36.0)
MCV: 88.5 fL (ref 80.0–100.0)
Platelets: 215 10*3/uL (ref 150–400)
RBC: 5.41 MIL/uL — ABNORMAL HIGH (ref 3.87–5.11)
RDW: 12.7 % (ref 11.5–15.5)
WBC: 14.6 10*3/uL — ABNORMAL HIGH (ref 4.0–10.5)
nRBC: 0 % (ref 0.0–0.2)

## 2022-12-20 LAB — RESP PANEL BY RT-PCR (RSV, FLU A&B, COVID)  RVPGX2
Influenza A by PCR: NEGATIVE
Influenza B by PCR: NEGATIVE
Resp Syncytial Virus by PCR: NEGATIVE
SARS Coronavirus 2 by RT PCR: NEGATIVE

## 2022-12-20 LAB — URINALYSIS, ROUTINE W REFLEX MICROSCOPIC
Glucose, UA: NEGATIVE mg/dL
Hgb urine dipstick: NEGATIVE
Ketones, ur: NEGATIVE mg/dL
Leukocytes,Ua: NEGATIVE
Nitrite: NEGATIVE
Protein, ur: NEGATIVE mg/dL
Specific Gravity, Urine: 1.01 (ref 1.005–1.030)
pH: 5.5 (ref 5.0–8.0)

## 2022-12-20 LAB — LACTIC ACID, PLASMA
Lactic Acid, Venous: 2.3 mmol/L (ref 0.5–1.9)
Lactic Acid, Venous: 2.2 mmol/L (ref 0.5–1.9)

## 2022-12-20 LAB — CBG MONITORING, ED: Glucose-Capillary: 194 mg/dL — ABNORMAL HIGH (ref 70–99)

## 2022-12-20 LAB — PROTIME-INR
INR: 1.2 (ref 0.8–1.2)
Prothrombin Time: 15.2 seconds (ref 11.4–15.2)

## 2022-12-20 LAB — LIPASE, BLOOD: Lipase: 25 U/L (ref 11–51)

## 2022-12-20 LAB — APTT: aPTT: 25 seconds (ref 24–36)

## 2022-12-20 LAB — CULTURE, BLOOD (ROUTINE X 2)

## 2022-12-20 LAB — POC OCCULT BLOOD, ED: Fecal Occult Bld: NEGATIVE

## 2022-12-20 LAB — GLUCOSE, CAPILLARY: Glucose-Capillary: 187 mg/dL — ABNORMAL HIGH (ref 70–99)

## 2022-12-20 MED ORDER — ENOXAPARIN SODIUM 40 MG/0.4ML IJ SOSY
40.0000 mg | PREFILLED_SYRINGE | INTRAMUSCULAR | Status: DC
  Administered 2022-12-20 – 2022-12-23 (×3): 40 mg via SUBCUTANEOUS
  Filled 2022-12-20 (×3): qty 0.4

## 2022-12-20 MED ORDER — LEVOFLOXACIN IN D5W 750 MG/150ML IV SOLN
750.0000 mg | Freq: Once | INTRAVENOUS | Status: AC
  Administered 2022-12-20: 750 mg via INTRAVENOUS
  Filled 2022-12-20: qty 150

## 2022-12-20 MED ORDER — ONDANSETRON HCL 4 MG/2ML IJ SOLN: 4.0000 mg | Freq: Four times a day (QID) | INTRAMUSCULAR | Status: DC | PRN

## 2022-12-20 MED ORDER — SODIUM CHLORIDE 0.9 % IV SOLN: INTRAVENOUS | Status: AC

## 2022-12-20 MED ORDER — IOHEXOL 300 MG/ML  SOLN
100.0000 mL | Freq: Once | INTRAMUSCULAR | Status: AC | PRN
  Administered 2022-12-20: 100 mL via INTRAVENOUS

## 2022-12-20 MED ORDER — LEVOFLOXACIN IN D5W 750 MG/150ML IV SOLN: 750.0000 mg | INTRAVENOUS | Status: DC

## 2022-12-20 MED ORDER — SODIUM CHLORIDE 0.9 % IV BOLUS
2000.0000 mL | Freq: Once | INTRAVENOUS | Status: AC
  Administered 2022-12-20: 2000 mL via INTRAVENOUS

## 2022-12-20 MED ORDER — METRONIDAZOLE 500 MG/100ML IV SOLN
500.0000 mg | Freq: Once | INTRAVENOUS | Status: AC
  Administered 2022-12-20: 500 mg via INTRAVENOUS
  Filled 2022-12-20: qty 100

## 2022-12-20 MED ORDER — CHOLECALCIFEROL 10 MCG (400 UNIT) PO TABS
400.0000 [IU] | ORAL_TABLET | Freq: Every day | ORAL | Status: DC
  Administered 2022-12-22 – 2022-12-24 (×2): 400 [IU] via ORAL
  Filled 2022-12-20 (×4): qty 1

## 2022-12-20 MED ORDER — METRONIDAZOLE 500 MG/100ML IV SOLN
500.0000 mg | Freq: Two times a day (BID) | INTRAVENOUS | Status: DC
  Administered 2022-12-21 – 2022-12-24 (×7): 500 mg via INTRAVENOUS
  Filled 2022-12-20 (×7): qty 100

## 2022-12-20 MED ORDER — INSULIN ASPART 100 UNIT/ML IJ SOLN
0.0000 [IU] | Freq: Three times a day (TID) | INTRAMUSCULAR | Status: DC
  Administered 2022-12-22 – 2022-12-24 (×2): 2 [IU] via SUBCUTANEOUS

## 2022-12-20 MED ORDER — ACETAMINOPHEN 325 MG PO TABS
650.0000 mg | ORAL_TABLET | Freq: Once | ORAL | Status: AC
  Administered 2022-12-20: 650 mg via ORAL
  Filled 2022-12-20: qty 2

## 2022-12-20 NOTE — Consult Note (Signed)
Reason for Consult: abdominal pain, abnormal CT Referring Physician: Amee Gladstone is an 72 y.o. female.  HPI:  Pt presented to the ED with 3 days of n/v/diarrhea with dark stools.  She has also had low-grade fevers.  She has also been having indigestion and abdominal cramping with fever.  She denies any history of abdominal pain.  She thought that all of this indigestion was related to the Ozempic, but she has continued to get worse.  She has difficulty eating at this point because of the nausea.  She tried pepto without relief.  She denies sick contacts.    She has never had a colonoscopy.  Imaging workup in the emergency department revealed evidence of a potentially metastatic malignant process in the liver, peritoneum, and lymphadenopathy.  This morning she reports having abnormal colored urine.   Past Medical History:  Diagnosis Date   Diabetes mellitus without complication (HCC)    Hypertension     Past Surgical History:  Procedure Laterality Date   APPLICATION OF CRANIAL NAVIGATION N/A 10/11/2020   Procedure: APPLICATION OF CRANIAL NAVIGATION;  Surgeon: Bedelia Person, MD;  Location: [facility] OR;  Service: Neurosurgery;  Laterality: N/A;   CRANIOTOMY N/A 10/11/2020   Procedure: CRANIOTOMY FOR RESECTION OF MENINGIOMA;  Surgeon: Bedelia Person, MD;  Location: [facility] OR;  Service: Neurosurgery;  Laterality: N/A;   REPLACEMENT TOTAL KNEE     TONSILLECTOMY      Family History  Problem Relation Age of Onset   Alcohol abuse Mother    Arthritis Mother    Stroke Mother    Alcohol abuse Father    Arthritis Father    Alcohol abuse Sister    Diabetes Sister    Alcohol abuse Sister    Arthritis Sister    Alcohol abuse Brother    Alcohol abuse Brother    Alcohol abuse Brother    Diabetes Brother    Alcohol abuse Daughter     Social History:  reports that she has quit smoking. Her smoking use included cigarettes. She quit smokeless tobacco use about 54 years ago. She reports that  she does not currently use alcohol. She reports that she does not use drugs.  Allergies:  Allergies  Allergen Reactions   Amoxicillin Shortness Of Breath   Penicillins Shortness Of Breath    Did it involve swelling of the face/tongue/throat, SOB, or low BP? Yes Did it involve sudden or severe rash/hives, skin peeling, or any reaction on the inside of your mouth or nose? No Did you need to seek medical attention at a hospital or doctor's office? Yes When did it last happen?       If all above answers are "NO", may proceed with cephalosporin use.     Medications:  Current Meds  Medication Sig   acetaminophen (TYLENOL) 500 MG tablet Take 500 mg by mouth every 4 (four) hours as needed for mild pain or moderate pain.   amLODipine (NORVASC) 10 MG tablet Take 1 tablet (10 mg total) by mouth daily. Follow-up appt due in July must see provider for future refills   bismuth subsalicylate (PEPTO BISMOL) 262 MG chewable tablet Chew 524 mg by mouth as needed for indigestion or diarrhea or loose stools.   cholecalciferol (VITAMIN D3) 10 MCG (400 UNIT) TABS tablet Take 400 Units by mouth daily.   Continuous Blood Gluc Receiver (FREESTYLE LIBRE 2 READER) DEVI 1 each by Does not apply route daily.   Continuous Blood Gluc Sensor (FREESTYLE LIBRE  2 SENSOR) MISC USE 1 SENSOR EVERY 14 DAYS AS DIRECTED (Patient taking differently: 1 each by Other route See admin instructions. USE 1 SENSOR EVERY 14 DAYS AS DIRECTED)   Dextrose, Diabetic Use, (GLUCOSE PO) Take 1 tablet by mouth See admin instructions. Patient uses Glucose Tablets with 15 grams of carbohydrates.   hydrochlorothiazide (MICROZIDE) 12.5 MG capsule Take 1 capsule (12.5 mg total) by mouth daily.   insulin aspart (NOVOLOG FLEXPEN) 100 UNIT/ML FlexPen Inject 5-7 units under skin up to 2x a day (Patient taking differently: Inject 7-9 Units into the skin See admin instructions. Inject 5-7 units under skin up to 2x a day)   Insulin Glargine (BASAGLAR  KWIKPEN) 100 UNIT/ML Inject 22 Units into the skin daily.   Insulin Pen Needle 32G X 4 MM MISC Use 4x a day (Patient taking differently: 1 each by Other route See admin instructions. Use 4x a day)   lisinopril (ZESTRIL) 20 MG tablet Take 1 tablet (20 mg total) by mouth daily. Follow-up appt due in July   pravastatin (PRAVACHOL) 40 MG tablet Take 1 tablet (40 mg total) by mouth daily.   Semaglutide,0.25 or 0.5MG /DOS, 2 MG/3ML SOPN Inject 0.25-0.5 mg into the skin once a week.     Results for orders placed or performed during the hospital encounter of 12/20/22 (from the past 48 hour(s))  CBG monitoring, ED     Status: Abnormal   Collection Time: 12/20/22 12:19 PM  Result Value Ref Range   Glucose-Capillary 194 (H) 70 - 99 mg/dL    Comment: Glucose reference range applies only to samples taken after fasting for at least 8 hours.  POC occult blood, ED     Status: None   Collection Time: 12/20/22 12:23 PM  Result Value Ref Range   Fecal Occult Bld NEGATIVE NEGATIVE  Resp panel by RT-PCR (RSV, Flu A&B, Covid) Anterior Nasal Swab     Status: None   Collection Time: 12/20/22 12:24 PM   Specimen: Anterior Nasal Swab  Result Value Ref Range   SARS Coronavirus 2 by RT PCR NEGATIVE NEGATIVE    Comment: (NOTE) SARS-CoV-2 target nucleic acids are NOT DETECTED.  The SARS-CoV-2 RNA is generally detectable in upper respiratory specimens during the acute phase of infection. The lowest concentration of SARS-CoV-2 viral copies this assay can detect is 138 copies/mL. A negative result does not preclude SARS-Cov-2 infection and should not be used as the sole basis for treatment or other patient management decisions. A negative result may occur with  improper specimen collection/handling, submission of specimen other than nasopharyngeal swab, presence of viral mutation(s) within the areas targeted by this assay, and inadequate number of viral copies(<138 copies/mL). A negative result must be combined  with clinical observations, patient history, and epidemiological information. The expected result is Negative.  Fact Sheet for Patients:  BloggerCourse.com  Fact Sheet for Healthcare Providers:  SeriousBroker.it  This test is no t yet approved or cleared by the [place] FDA and  has been authorized for detection and/or diagnosis of SARS-CoV-2 by FDA under an Emergency Use Authorization (EUA). This EUA will remain  in effect (meaning this test can be used) for the duration of the COVID-19 declaration under Section 564(b)(1) of the Act, 21 U.S.C.section 360bbb-3(b)(1), unless the authorization is terminated  or revoked sooner.       Influenza A by PCR NEGATIVE NEGATIVE   Influenza B by PCR NEGATIVE NEGATIVE    Comment: (NOTE) The Xpert Xpress SARS-CoV-2/FLU/RSV plus assay is intended as  an aid in the diagnosis of influenza from Nasopharyngeal swab specimens and should not be used as a sole basis for treatment. Nasal washings and aspirates are unacceptable for Xpert Xpress SARS-CoV-2/FLU/RSV testing.  Fact Sheet for Patients: BloggerCourse.com  Fact Sheet for Healthcare Providers: SeriousBroker.it  This test is not yet approved or cleared by the [place] FDA and has been authorized for detection and/or diagnosis of SARS-CoV-2 by FDA under an Emergency Use Authorization (EUA). This EUA will remain in effect (meaning this test can be used) for the duration of the COVID-19 declaration under Section 564(b)(1) of the Act, 21 U.S.C. section 360bbb-3(b)(1), unless the authorization is terminated or revoked.     Resp Syncytial Virus by PCR NEGATIVE NEGATIVE    Comment: (NOTE) Fact Sheet for Patients: BloggerCourse.com  Fact Sheet for Healthcare Providers: SeriousBroker.it  This test is not yet approved or cleared by the  [place] FDA and has been authorized for detection and/or diagnosis of SARS-CoV-2 by FDA under an Emergency Use Authorization (EUA). This EUA will remain in effect (meaning this test can be used) for the duration of the COVID-19 declaration under Section 564(b)(1) of the Act, 21 U.S.C. section 360bbb-3(b)(1), unless the authorization is terminated or revoked.  Performed at [facility], [address], [place], [place] [address]   Lipase, blood     Status: None   Collection Time: 12/20/22  1:08 PM  Result Value Ref Range   Lipase 25 11 - 51 U/L    Comment: Performed at [facility], [address], [place], [place] [address]  Comprehensive metabolic panel     Status: Abnormal   Collection Time: 12/20/22  1:08 PM  Result Value Ref Range   Sodium 131 (L) 135 - 145 mmol/L   Potassium 4.4 3.5 - 5.1 mmol/L   Chloride 94 (L) 98 - 111 mmol/L   CO2 24 22 - 32 mmol/L   Glucose, Bld 201 (H) 70 - 99 mg/dL    Comment: Glucose reference range applies only to samples taken after fasting for at least 8 hours.   BUN 23 8 - 23 mg/dL   Creatinine, Ser 0.98 (H) 0.44 - 1.00 mg/dL   Calcium 9.0 8.9 - 11.9 mg/dL   Total Protein 7.9 6.5 - 8.1 g/dL   Albumin 3.7 3.5 - 5.0 g/dL   AST 65 (H) 15 - 41 U/L   ALT 86 (H) 0 - 44 U/L   Alkaline Phosphatase 113 38 - 126 U/L   Total Bilirubin 3.7 (H) 0.3 - 1.2 mg/dL   GFR, Estimated 46 (L) >60 mL/min    Comment: (NOTE) Calculated using the CKD-EPI Creatinine Equation (2021)    Anion gap 13 5 - 15    Comment: Performed at [facility], [address], [place], [place] [address]  CBC     Status: Abnormal   Collection Time: 12/20/22  1:08 PM  Result Value Ref Range   WBC 14.6 (H) 4.0 - 10.5 K/uL   RBC 5.41 (H) 3.87 - 5.11 MIL/uL   Hemoglobin 15.8 (H) 12.0 - 15.0 g/dL   HCT 95.6 (H) 21.3 - 08.6 %   MCV 88.5 80.0 - 100.0 fL   MCH 29.2 26.0 - 34.0 pg   MCHC 33.0 30.0 - 36.0 g/dL   RDW 57.8 46.9 -  62.9 %   Platelets 215 150 - 400 K/uL   nRBC 0.0 0.0 - 0.2 %    Comment: Performed at [facility], [address], [place], [place] [address]  Lactic acid, plasma     Status: Abnormal   Collection Time: 12/20/22  1:08 PM  Result Value Ref Range   Lactic Acid, Venous 2.3 (HH) 0.5 - 1.9 mmol/L    Comment: CRITICAL RESULT CALLED TO, READ BACK BY AND VERIFIED WITH Laurell Roof RN @ 1346 ON 12/20/2022 BY Milly Jakob Performed at [facility], [address] [place], [place] [address]   Protime-INR     Status: None   Collection Time: 12/20/22  1:08 PM  Result Value Ref Range   Prothrombin Time 15.2 11.4 - 15.2 seconds   INR 1.2 0.8 - 1.2    Comment: (NOTE) INR goal varies based on device and disease states. Performed at [facility], [address], [place], [place] [address]   APTT     Status: None   Collection Time: 12/20/22  1:08 PM  Result Value Ref Range   aPTT 25 24 - 36 seconds    Comment: Performed at [facility], [address] [place], [place] [address]  Differential     Status: Abnormal   Collection Time: 12/20/22  1:08 PM  Result Value Ref Range   Neutrophils Relative % 96 %   Neutro Abs 13.8 (H) 1.7 - 7.7 K/uL   Lymphocytes Relative 1 %   Lymphs Abs 0.2 (L) 0.7 - 4.0 K/uL   Monocytes Relative 3 %   Monocytes Absolute 0.4 0.1 - 1.0 K/uL   Eosinophils Relative 0 %   Eosinophils Absolute 0.0 0.0 - 0.5 K/uL   Basophils Relative 0 %   Basophils Absolute 0.0 0.0 - 0.1 K/uL   Immature Granulocytes 0 %   Abs Immature Granulocytes 0.06 0.00 - 0.07 K/uL    Comment: Performed at [facility], [address] [place], [place] [address]  Lactic acid, plasma     Status: Abnormal   Collection Time: 12/20/22  3:30 PM  Result Value Ref Range   Lactic Acid, Venous 2.2 (HH) 0.5 - 1.9 mmol/L    Comment: CRITICAL RESULT CALLED TO, READ BACK BY AND VERIFIED WITH Clelia Croft, S RN @ 1615 ON 12/20/2022  BY Milly Jakob Performed at [facility], [address], [place], [place] [address]   Urinalysis, Routine w reflex microscopic -     Status: Abnormal   Collection Time: 12/20/22  6:59 PM  Result Value Ref Range   Color, Urine YELLOW YELLOW   APPearance CLEAR CLEAR   Specific Gravity, Urine 1.010 1.005 - 1.030   pH 5.5 5.0 - 8.0   Glucose, UA NEGATIVE NEGATIVE mg/dL   Hgb urine dipstick NEGATIVE NEGATIVE   Bilirubin Urine SMALL (A) NEGATIVE   Ketones, ur NEGATIVE NEGATIVE mg/dL   Protein, ur NEGATIVE NEGATIVE mg/dL   Nitrite NEGATIVE NEGATIVE   Leukocytes,Ua NEGATIVE NEGATIVE    Comment: Microscopic not done on urines with negative protein, blood, leukocytes, nitrite, or glucose < 500 mg/dL. Performed at [facility], [address], [place], [place] [address]   Glucose, capillary     Status: Abnormal   Collection Time: 12/20/22  9:20 PM  Result Value Ref Range   Glucose-Capillary 187 (H) 70 - 99 mg/dL    Comment: Glucose reference range applies only to samples taken after fasting for at least 8 hours.    CT ABDOMEN PELVIS W CONTRAST  Result Date: 12/20/2022 CLINICAL DATA:  72 year old female with acute abdominal and pelvic  pain. EXAM: CT ABDOMEN AND PELVIS WITH CONTRAST TECHNIQUE: Multidetector CT imaging of the abdomen and pelvis was performed using the standard protocol following bolus administration of intravenous contrast. RADIATION DOSE REDUCTION: This exam was performed according to the departmental dose-optimization program which includes automated exposure control, adjustment of the mA and/or kV according to patient size and/or use of iterative reconstruction technique. CONTRAST:  OMNIPAQUE IOHEXOL 300 MG/ML  SOLN COMPARISON:  05/17/2019 CT and prior studies FINDINGS: Lower chest: No acute abnormality. Hepatobiliary: There are multiple low-density lesions within both the RIGHT and LEFT liver, with index 1.5 x 2 cm lesion and 1.5 x 2.5 cm  lesion at the dome (image 10: Series 2). Gallbladder wall thickening and cholelithiasis noted. No definite biliary dilatation identified. Pancreas: Unremarkable Spleen: Unremarkable Adrenals/Urinary Tract: Punctate nonobstructing bilateral renal calculi are again identified without other significant change. There is no evidence of suspicious renal mass hydronephrosis. The adrenal glands and bladder are unremarkable. Stomach/Bowel: No definite bowel wall thickening noted. There is no evidence of bowel obstruction or inflammatory changes. Vascular/Lymphatic: Periportal, mesenteric, periaortic and retroperitoneal adenopathy identified with the index 1.6 x 3.9 cm portacaval node (22:2) identified. Aortic atherosclerotic calcifications noted without aneurysm. Reproductive: Uterus and adnexal regions are unchanged with ureteral calcifications/fibroids. Other: No ascites, focal collection or pneumoperitoneum. Musculoskeletal: No acute or suspicious bony abnormalities are noted. Moderate degenerative disc disease/spondylosis at T12-L1 and L4-5 again noted. IMPRESSION: 1. Multiple low-density lesions throughout the liver and abdominal/retroperitoneal lymphadenopathy, highly suspicious for malignancy/metastatic disease. Primary malignancy is not identified on this examination. 2. Gallbladder wall thickening and cholelithiasis without definite biliary dilatation. If there is strong clinical suspicion for acute cholecystitis, recommend ultrasound or nuclear medicine study. 3. Punctate nonobstructing bilateral renal calculi. 4.  Aortic Atherosclerosis (ICD10-I70.0). Electronically Signed   By: Harmon Pier M.D.   On: 12/20/2022 15:38   DG Chest Port 1 View  Result Date: 12/20/2022 CLINICAL DATA:  Nausea and vomiting.  Melena.  Sepsis. EXAM: PORTABLE CHEST 1 VIEW COMPARISON:  10/10/2020 FINDINGS: Patient is rotated to the left. Heart size is within normal limits. Aortic atherosclerotic calcification incidentally noted. Both  lungs are clear. IMPRESSION: No active disease. Electronically Signed   By: Danae Orleans M.D.   On: 12/20/2022 13:55    Review of Systems  Constitutional:  Positive for appetite change.  HENT: Negative.    Eyes: Negative.   Respiratory: Negative.    Cardiovascular: Negative.   Gastrointestinal:  Positive for diarrhea, nausea and vomiting. Negative for abdominal distention and abdominal pain.  Endocrine: Negative.   Genitourinary: Negative.        Discolored urine  Musculoskeletal: Negative.   Skin: Negative.   Allergic/Immunologic: Negative.   Neurological: Negative.   Hematological: Negative.   Psychiatric/Behavioral: Negative.    All other systems reviewed and are negative.   Blood pressure (!) 107/58, pulse 97, temperature 99.6 F (37.6 C), temperature source Oral, resp. rate 18, height 5\' 3"  (1.6 m), weight 108.9 kg, SpO2 97 %. Physical Exam Vitals reviewed.  Constitutional:      General: She is not in acute distress.    Appearance: Normal appearance. She is obese. She is ill-appearing (looks weak).  HENT:     Head: Normocephalic and atraumatic.     Right Ear: External ear normal.     Left Ear: External ear normal.     Nose: Nose normal. No congestion or rhinorrhea.     Mouth/Throat:     Mouth: Mucous membranes are moist.  Eyes:  General: Scleral icterus present.        Right eye: No discharge.        Left eye: No discharge.     Conjunctiva/sclera: Conjunctivae normal.     Pupils: Pupils are equal, round, and reactive to light.  Cardiovascular:     Rate and Rhythm: Normal rate and regular rhythm.     Pulses: Normal pulses.  Pulmonary:     Effort: Pulmonary effort is normal.  Chest:     Chest wall: No tenderness.  Abdominal:     General: There is no distension.     Palpations: Abdomen is soft. There is no mass.     Tenderness: There is no abdominal tenderness. There is no guarding or rebound.     Hernia: No hernia is present.  Musculoskeletal:         General: No swelling, tenderness, deformity or signs of injury.     Cervical back: Normal range of motion.  Lymphadenopathy:     Cervical: No cervical adenopathy.  Skin:    Capillary Refill: Capillary refill takes 2 to 3 seconds.     Coloration: Skin is not jaundiced or pale.     Findings: No bruising or erythema.  Neurological:     General: No focal deficit present.     Mental Status: She is alert and oriented to person, place, and time.  Psychiatric:        Mood and Affect: Mood normal.        Behavior: Behavior normal.        Thought Content: Thought content normal.      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    Elevated LFTs.   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  Would not order HIDA as clinical suspicion is low for cholecystitis given absence of pain.   Please let us know if we can be of further assistance with this very nice patient.     Almond Lint 12/20/2022, 10:38 PM

## 2022-12-20 NOTE — ED Provider Notes (Signed)
EMERGENCY DEPARTMENT AT [facility] Provider Note   CSN: [id number] Arrival date & time: 12/20/22  1203     History  Chief Complaint  Patient presents with   Emesis   Nausea   Melena    Regina Mckay is a 72 y.o. female.  Patient has a history of diabetes hypertension and obesity.  She states that for 3 days she has been having vomiting and diarrhea with dark diarrhea.  She has mild abdominal cramping and fever  The history is provided by the patient and medical records. No language interpreter was used.  Emesis Severity:  Moderate Timing:  Constant Quality:  Bilious material Able to tolerate:  Liquids Progression:  Worsening Chronicity:  New Recent urination:  Normal Relieved by:  Nothing Worsened by:  Nothing Associated symptoms: diarrhea   Associated symptoms: no abdominal pain, no cough and no headaches        Home Medications Prior to Admission medications   Medication Sig Start Date End Date Taking? Authorizing Provider  acetaminophen (TYLENOL) 500 MG tablet Take 500 mg by mouth every 4 (four) hours as needed for mild pain or moderate pain.   Yes [provider]  amLODipine (NORVASC) 10 MG tablet Take 1 tablet (10 mg total) by mouth daily. Follow-up appt due in July must see provider for future refills 11/06/22  Yes [name], NP  bismuth subsalicylate (PEPTO BISMOL) 262 MG chewable tablet Chew 524 mg by mouth as needed for indigestion or diarrhea or loose stools.   Yes [provider]  cholecalciferol (VITAMIN D3) 10 MCG (400 UNIT) TABS tablet Take 400 Units by mouth daily.   Yes [provider]  Continuous Blood Gluc Receiver (FREESTYLE LIBRE 2 READER) DEVI 1 each by Does not apply route daily. 10/25/21  Yes Carlus Pavlov, MD  Continuous Blood Gluc Sensor (FREESTYLE LIBRE 2 SENSOR) MISC USE 1 SENSOR EVERY 14 DAYS AS DIRECTED Patient taking differently: 1 each by Other route See admin instructions. USE 1  SENSOR EVERY 14 DAYS AS DIRECTED 09/30/22  Yes Carlus Pavlov, MD  Dextrose, Diabetic Use, (GLUCOSE PO) Take 1 tablet by mouth See admin instructions. Patient uses Glucose Tablets with 15 grams of carbohydrates.   Yes [provider]  hydrochlorothiazide (MICROZIDE) 12.5 MG capsule Take 1 capsule (12.5 mg total) by mouth daily. 05/28/22  Yes [name], NP  insulin aspart (NOVOLOG FLEXPEN) 100 UNIT/ML FlexPen Inject 5-7 units under skin up to 2x a day Patient taking differently: Inject 7-9 Units into the skin See admin instructions. Inject 5-7 units under skin up to 2x a day 05/06/22  Yes Carlus Pavlov, MD  Insulin Glargine (BASAGLAR KWIKPEN) 100 UNIT/ML Inject 22 Units into the skin daily. 05/06/22  Yes Carlus Pavlov, MD  Insulin Pen Needle 32G X 4 MM MISC Use 4x a day Patient taking differently: 1 each by Other route See admin instructions. Use 4x a day 09/13/21  Yes Carlus Pavlov, MD  lisinopril (ZESTRIL) 20 MG tablet Take 1 tablet (20 mg total) by mouth daily. Follow-up appt due in July 11/17/22  Yes [name], NP  pravastatin (PRAVACHOL) 40 MG tablet Take 1 tablet (40 mg total) by mouth daily. 08/25/22  Yes [name], NP  Semaglutide,0.25 or 0.5MG /DOS, 2 MG/3ML SOPN Inject 0.25-0.5 mg into the skin once a week. 11/06/22  Yes Carlus Pavlov, MD  lidocaine (LIDODERM) 5 % PLACE 1 PATCH ONTO THE SKIN DAILY. REMOVE& DISCARD PATCH WITHIN 12 HOURS Patient  not taking: Reported on 12/20/2022 08/22/21   Kathryne Hitch, MD      Allergies    Amoxicillin and Penicillins    Review of Systems   Review of Systems  Constitutional:  Negative for appetite change and fatigue.  HENT:  Negative for congestion, ear discharge and sinus pressure.   Eyes:  Negative for discharge.  Respiratory:  Negative for cough.   Cardiovascular:  Negative for chest pain.  Gastrointestinal:  Positive for diarrhea and vomiting. Negative for abdominal pain.  Genitourinary:  Negative for  frequency and hematuria.  Musculoskeletal:  Negative for back pain.  Skin:  Negative for rash.  Neurological:  Negative for seizures and headaches.  Psychiatric/Behavioral:  Negative for hallucinations.     Physical Exam Updated Vital Signs BP (!) 146/74 (BP Location: Right Arm)   Pulse (!) 106   Temp (!) 100.7 F (38.2 C) (Oral)   Resp 17   Ht 5\' 3"  (1.6 m)   Wt 108.9 kg   SpO2 98%   BMI 42.51 kg/m  Physical Exam Vitals and nursing note reviewed.  Constitutional:      Appearance: She is well-developed.  HENT:     Head: Normocephalic.     Nose: Nose normal.  Eyes:     General: No scleral icterus.    Conjunctiva/sclera: Conjunctivae normal.  Neck:     Thyroid: No thyromegaly.  Cardiovascular:     Rate and Rhythm: Normal rate and regular rhythm.     Heart sounds: No murmur heard.    No friction rub. No gallop.  Pulmonary:     Breath sounds: No stridor. No wheezing or rales.  Chest:     Chest wall: No tenderness.  Abdominal:     General: There is no distension.     Tenderness: There is no abdominal tenderness. There is no rebound.  Genitourinary:    Comments: Rectal shows dark brown stool that is heme-negative Musculoskeletal:        General: Normal range of motion.     Cervical back: Neck supple.  Lymphadenopathy:     Cervical: No cervical adenopathy.  Skin:    Findings: No erythema or rash.  Neurological:     Mental Status: She is alert and oriented to person, place, and time.     Motor: No abnormal muscle tone.     Coordination: Coordination normal.  Psychiatric:        Behavior: Behavior normal.     ED Results / Procedures / Treatments   Labs (all labs ordered are listed, but only abnormal results are displayed) Labs Reviewed  COMPREHENSIVE METABOLIC PANEL - Abnormal; Notable for the following components:      Result Value   Sodium 131 (*)    Chloride 94 (*)    Glucose, Bld 201 (*)    Creatinine, Ser 1.23 (*)    AST 65 (*)    ALT 86 (*)     Total Bilirubin 3.7 (*)    GFR, Estimated 46 (*)    All other components within normal limits  CBC - Abnormal; Notable for the following components:   WBC 14.6 (*)    RBC 5.41 (*)    Hemoglobin 15.8 (*)    HCT 47.9 (*)    All other components within normal limits  LACTIC ACID, PLASMA - Abnormal; Notable for the following components:   Lactic Acid, Venous 2.3 (*)    All other components within normal limits  DIFFERENTIAL - Abnormal; Notable for the following  components:   Neutro Abs 13.8 (*)    Lymphs Abs 0.2 (*)    All other components within normal limits  CBG MONITORING, ED - Abnormal; Notable for the following components:   Glucose-Capillary 194 (*)    All other components within normal limits  RESP PANEL BY RT-PCR (RSV, FLU A&B, COVID)  RVPGX2  CULTURE, BLOOD (ROUTINE X 2)  CULTURE, BLOOD (ROUTINE X 2)  LIPASE, BLOOD  PROTIME-INR  APTT  LACTIC ACID, PLASMA  URINALYSIS, ROUTINE W REFLEX MICROSCOPIC  POC OCCULT BLOOD, ED    EKG None  Radiology CT ABDOMEN PELVIS W CONTRAST  Result Date: 12/20/2022 CLINICAL DATA:  72 year old female with acute abdominal and pelvic pain. EXAM: CT ABDOMEN AND PELVIS WITH CONTRAST TECHNIQUE: Multidetector CT imaging of the abdomen and pelvis was performed using the standard protocol following bolus administration of intravenous contrast. RADIATION DOSE REDUCTION: This exam was performed according to the departmental dose-optimization program which includes automated exposure control, adjustment of the mA and/or kV according to patient size and/or use of iterative reconstruction technique. CONTRAST:  OMNIPAQUE IOHEXOL 300 MG/ML  SOLN COMPARISON:  05/17/2019 CT and prior studies FINDINGS: Lower chest: No acute abnormality. Hepatobiliary: There are multiple low-density lesions within both the RIGHT and LEFT liver, with index 1.5 x 2 cm lesion and 1.5 x 2.5 cm lesion at the dome (image 10: Series 2). Gallbladder wall thickening and cholelithiasis  noted. No definite biliary dilatation identified. Pancreas: Unremarkable Spleen: Unremarkable Adrenals/Urinary Tract: Punctate nonobstructing bilateral renal calculi are again identified without other significant change. There is no evidence of suspicious renal mass hydronephrosis. The adrenal glands and bladder are unremarkable. Stomach/Bowel: No definite bowel wall thickening noted. There is no evidence of bowel obstruction or inflammatory changes. Vascular/Lymphatic: Periportal, mesenteric, periaortic and retroperitoneal adenopathy identified with the index 1.6 x 3.9 cm portacaval node (22:2) identified. Aortic atherosclerotic calcifications noted without aneurysm. Reproductive: Uterus and adnexal regions are unchanged with ureteral calcifications/fibroids. Other: No ascites, focal collection or pneumoperitoneum. Musculoskeletal: No acute or suspicious bony abnormalities are noted. Moderate degenerative disc disease/spondylosis at T12-L1 and L4-5 again noted. IMPRESSION: 1. Multiple low-density lesions throughout the liver and abdominal/retroperitoneal lymphadenopathy, highly suspicious for malignancy/metastatic disease. Primary malignancy is not identified on this examination. 2. Gallbladder wall thickening and cholelithiasis without definite biliary dilatation. If there is strong clinical suspicion for acute cholecystitis, recommend ultrasound or nuclear medicine study. 3. Punctate nonobstructing bilateral renal calculi. 4.  Aortic Atherosclerosis (ICD10-I70.0). Electronically Signed   By: Harmon Pier M.D.   On: 12/20/2022 15:38   DG Chest Port 1 View  Result Date: 12/20/2022 CLINICAL DATA:  Nausea and vomiting.  Melena.  Sepsis. EXAM: PORTABLE CHEST 1 VIEW COMPARISON:  10/10/2020 FINDINGS: Patient is rotated to the left. Heart size is within normal limits. Aortic atherosclerotic calcification incidentally noted. Both lungs are clear. IMPRESSION: No active disease. Electronically Signed   By: Danae Orleans  M.D.   On: 12/20/2022 13:55    Procedures Procedures    Medications Ordered in ED Medications  levofloxacin (LEVAQUIN) IVPB 750 mg (0 mg Intravenous Stopped 12/20/22 1515)  metroNIDAZOLE (FLAGYL) IVPB 500 mg (0 mg Intravenous Stopped 12/20/22 1500)  sodium chloride 0.9 % bolus 2,000 mL (2,000 mLs Intravenous New Bag/Given 12/20/22 1319)  acetaminophen (TYLENOL) tablet 650 mg (650 mg Oral Given 12/20/22 1318)  iohexol (OMNIPAQUE) 300 MG/ML solution 100 mL (100 mLs Intravenous Contrast Given 12/20/22 1514)    ED Course/ Medical Decision Making/ A&P   {  CRITICAL CARE Performed  by: Bethann Berkshire Total critical care time: 45 minutes Critical care time was exclusive of separately billable procedures and treating other patients. Critical care was necessary to treat or prevent imminent or life-threatening deterioration. Critical care was time spent personally by me on the following activities: development of treatment plan with patient and/or surrogate as well as nursing, discussions with consultants, evaluation of patient's response to treatment, examination of patient, obtaining history from patient or surrogate, ordering and performing treatments and interventions, ordering and review of laboratory studies, ordering and review of radiographic studies, pulse oximetry and re-evaluation of patient's condition.                          Medical Decision Making Amount and/or Complexity of Data Reviewed Labs: ordered. Radiology: ordered. ECG/medicine tests: ordered.  Risk OTC drugs. Prescription drug management. Decision regarding hospitalization.   Patient with gallstones and possible metastatic lesions.  General surgery will be consulted and she will be a medicine admission.  Dr. Deretha Emory will disposition the patient   Final Clinical Impression(s) / ED Diagnoses Final diagnoses:  Nausea vomiting and diarrhea    Rx / DC Orders ED Discharge Orders     None         Bethann Berkshire, MD 12/20/22 2017

## 2022-12-20 NOTE — ED Triage Notes (Signed)
Yesterday developed N/V/ D. Stools are reported black in color. Last food intake yesterday.

## 2022-12-20 NOTE — ED Provider Notes (Signed)
Patient remains hemodynamically stable.  Discussed with on-call surgery Dr. Donell Beers.  Also discussed with hospitalist for admission.  Patient's lactic acid still 2.2.  No hypotension.  Slight tachycardia patient is very alert and nontoxic.  Patient may have some biliary disease ongoing but there is also concerns based on the CT for a intra-abdominal metastatic process may be with some liver mets and some peritoneal mets.  Patient historically told me that she has been needing to use Pepto-Bismol for several months now intermittently.  Fast past 3 days started with nausea vomiting diarrhea.  Also says her appetites been down but she has been on Ozempic so she has not had much of an appetite since she started that.  Hospitalist will admit General surgery has her on list for consultation.  GI medicine may need to get involved.   Regina Mulders, MD 12/20/22 [phone]

## 2022-12-20 NOTE — H&P (Addendum)
History and Physical    WAKITA LABERGE BJY:[id number] DOB: 1951-03-12 DOA: 12/20/2022  PCP: [name], NP Patient coming from: home  I have personally briefly reviewed patient's old medical records in [facility] Link  Chief Complaint: N/v/d since Thursday evening, no ab pain  HPI: Regina Mckay is a 72 y.o. female with h/o obesity, HTN, IDDM2 also on Ozempic report started have nausea vomiting diarrhea since Thursday evening, denies Abdo pain, report has been having upset stomach taking Pepto-Bismol every2- 3 days, reports fever at home, denies sick contact  ED Course: Blood pressure (!) 107/58, pulse 97, temperature 99.6 F (37.6 C), temperature source Oral, resp. rate 18, height 5\' 3"  (1.6 m), weight 108.9 kg, SpO2 97 %.   Data reviewed: Ct ab : 1. Multiple low-density lesions throughout the liver and abdominal/retroperitoneal lymphadenopathy, highly suspicious for malignancy/metastatic disease. Primary malignancy is not identified on this examination. 2. Gallbladder wall thickening and cholelithiasis without definite biliary dilatation. If there is strong clinical suspicion for acute cholecystitis, recommend ultrasound or nuclear medicine study. 3. Punctate nonobstructing bilateral renal calculi. 4.  Aortic Atherosclerosis (ICD10-I70.0).   Wbc 14.6, UA no sign of infection, blood culture obtained in ED in process, Lactic acid 2.3 on presentation, received hydration and antibiotics, repeat lactic acid 2.2 Sodium 131, glucose 201, BUN 23, Crea 1.23, total bilirubin 3.7, AST 65, ALT 86, alk phos 113  She received IV fluids, Levaquin, Flagyl, admission requested due to sepsis from intra-abdominal source, possible symptomatic gallstone, and concerning for metastatic disease  Review of Systems: As per HPI otherwise all other systems reviewed and are negative.   Past Medical History:  Diagnosis Date   Diabetes mellitus without complication (HCC)    Hypertension     Past  Surgical History:  Procedure Laterality Date   APPLICATION OF CRANIAL NAVIGATION N/A 10/11/2020   Procedure: APPLICATION OF CRANIAL NAVIGATION;  Surgeon: Bedelia Person, MD;  Location: [facility] OR;  Service: Neurosurgery;  Laterality: N/A;   CRANIOTOMY N/A 10/11/2020   Procedure: CRANIOTOMY FOR RESECTION OF MENINGIOMA;  Surgeon: Bedelia Person, MD;  Location: [facility] OR;  Service: Neurosurgery;  Laterality: N/A;   REPLACEMENT TOTAL KNEE     TONSILLECTOMY      Social History  reports that she has quit smoking. Her smoking use included cigarettes. She quit smokeless tobacco use about 54 years ago. She reports that she does not currently use alcohol. She reports that she does not use drugs.  Allergies  Allergen Reactions   Amoxicillin Shortness Of Breath   Penicillins Shortness Of Breath    Did it involve swelling of the face/tongue/throat, SOB, or low BP? Yes Did it involve sudden or severe rash/hives, skin peeling, or any reaction on the inside of your mouth or nose? No Did you need to seek medical attention at a hospital or doctor's office? Yes When did it last happen?       If all above answers are "NO", may proceed with cephalosporin use.     Family History  Problem Relation Age of Onset   Alcohol abuse Mother    Arthritis Mother    Stroke Mother    Alcohol abuse Father    Arthritis Father    Alcohol abuse Sister    Diabetes Sister    Alcohol abuse Sister    Arthritis Sister    Alcohol abuse Brother    Alcohol abuse Brother    Alcohol abuse Brother    Diabetes Brother    Alcohol  abuse Daughter     Prior to Admission medications   Medication Sig Start Date End Date Taking? Authorizing Provider  acetaminophen (TYLENOL) 500 MG tablet Take 500 mg by mouth every 4 (four) hours as needed for mild pain or moderate pain.   Yes [provider]  amLODipine (NORVASC) 10 MG tablet Take 1 tablet (10 mg total) by mouth daily. Follow-up appt due in July must see provider for  future refills 11/06/22  Yes [name], NP  bismuth subsalicylate (PEPTO BISMOL) 262 MG chewable tablet Chew 524 mg by mouth as needed for indigestion or diarrhea or loose stools.   Yes [provider]  cholecalciferol (VITAMIN D3) 10 MCG (400 UNIT) TABS tablet Take 400 Units by mouth daily.   Yes [provider]  Continuous Blood Gluc Receiver (FREESTYLE LIBRE 2 READER) DEVI 1 each by Does not apply route daily. 10/25/21  Yes Carlus Pavlov, MD  Continuous Blood Gluc Sensor (FREESTYLE LIBRE 2 SENSOR) MISC USE 1 SENSOR EVERY 14 DAYS AS DIRECTED Patient taking differently: 1 each by Other route See admin instructions. USE 1 SENSOR EVERY 14 DAYS AS DIRECTED 09/30/22  Yes Carlus Pavlov, MD  Dextrose, Diabetic Use, (GLUCOSE PO) Take 1 tablet by mouth See admin instructions. Patient uses Glucose Tablets with 15 grams of carbohydrates.   Yes [provider]  hydrochlorothiazide (MICROZIDE) 12.5 MG capsule Take 1 capsule (12.5 mg total) by mouth daily. 05/28/22  Yes [name], NP  insulin aspart (NOVOLOG FLEXPEN) 100 UNIT/ML FlexPen Inject 5-7 units under skin up to 2x a day Patient taking differently: Inject 7-9 Units into the skin See admin instructions. Inject 5-7 units under skin up to 2x a day 05/06/22  Yes Carlus Pavlov, MD  Insulin Glargine (BASAGLAR KWIKPEN) 100 UNIT/ML Inject 22 Units into the skin daily. 05/06/22  Yes Carlus Pavlov, MD  Insulin Pen Needle 32G X 4 MM MISC Use 4x a day Patient taking differently: 1 each by Other route See admin instructions. Use 4x a day 09/13/21  Yes Carlus Pavlov, MD  lisinopril (ZESTRIL) 20 MG tablet Take 1 tablet (20 mg total) by mouth daily. Follow-up appt due in July 11/17/22  Yes [name], NP  pravastatin (PRAVACHOL) 40 MG tablet Take 1 tablet (40 mg total) by mouth daily. 08/25/22  Yes [name], NP  Semaglutide,0.25 or 0.5MG /DOS, 2 MG/3ML SOPN Inject 0.25-0.5 mg into the skin once a week. 11/06/22  Yes  Carlus Pavlov, MD  lidocaine (LIDODERM) 5 % PLACE 1 PATCH ONTO THE SKIN DAILY. REMOVE& DISCARD [facility] WITHIN 12 HOURS Patient not taking: Reported on 12/20/2022 08/22/21   Kathryne Hitch, MD    Physical Exam: Vitals:   12/20/22 1500 12/20/22 1600 12/20/22 1700 12/20/22 1915  BP: 134/73 (!) 146/74 (!) 109/52 (!) 107/58  Pulse: (!) 109 (!) 106 (!) 101 97  Resp: 11 17 11 18   Temp:  (!) 100.7 F (38.2 C)  99.6 F (37.6 C)  TempSrc:  Oral  Oral  SpO2: (!) 88% 98% 97% 97%  Weight:      Height:        Constitutional: NAD Eyes: PERRL, lids and conjunctivae normal ENMT: Mucous membranes are moist.  Respiratory: clear to auscultation bilaterally, no wheezing, no crackles. Normal respiratory effort. No accessory muscle use.  Cardiovascular: Regular rate and rhythm,  No extremity edema. 2+ pedal pulses. No carotid bruits.  Abdomen: no tenderness, not distended, Bowel sounds positive.  Musculoskeletal: no clubbing / cyanosis. No joint  deformity upper and lower extremities. Good ROM, no contractures. Normal muscle tone.  Skin: no rashes, lesions, ulcers. No induration Neurologic: CN 2-12 grossly intact. Sensation intact, Strength 5/5 in all 4.  Psychiatric: Normal judgment and insight. Alert and oriented x 3. Normal mood.    Labs on Admission: I have personally reviewed following labs and imaging studies  CBC: Recent Labs  Lab 12/20/22 1308  WBC 14.6*  NEUTROABS 13.8*  HGB 15.8*  HCT 47.9*  MCV 88.5  PLT 215    Basic Metabolic Panel: Recent Labs  Lab 12/20/22 1308  NA 131*  K 4.4  CL 94*  CO2 24  GLUCOSE 201*  BUN 23  CREATININE 1.23*  CALCIUM 9.0    GFR: Estimated Creatinine Clearance: 48.2 mL/min (A) (by C-G formula based on SCr of 1.23 mg/dL (H)).  Liver Function Tests: Recent Labs  Lab 12/20/22 1308  AST 65*  ALT 86*  ALKPHOS 113  BILITOT 3.7*  PROT 7.9  ALBUMIN 3.7    Urine analysis:    Component Value Date/Time   COLORURINE YELLOW  12/20/2022 1859   APPEARANCEUR CLEAR 12/20/2022 1859   LABSPEC 1.010 12/20/2022 1859   PHURINE 5.5 12/20/2022 1859   GLUCOSEU NEGATIVE 12/20/2022 1859   HGBUR NEGATIVE 12/20/2022 1859   BILIRUBINUR SMALL (A) 12/20/2022 1859   KETONESUR NEGATIVE 12/20/2022 1859   PROTEINUR NEGATIVE 12/20/2022 1859   UROBILINOGEN 1.0 10/18/2009 1052   NITRITE NEGATIVE 12/20/2022 1859   LEUKOCYTESUR NEGATIVE 12/20/2022 1859    Radiological Exams on Admission: CT ABDOMEN PELVIS W CONTRAST  Result Date: 12/20/2022 CLINICAL DATA:  72 year old female with acute abdominal and pelvic pain. EXAM: CT ABDOMEN AND PELVIS WITH CONTRAST TECHNIQUE: Multidetector CT imaging of the abdomen and pelvis was performed using the standard protocol following bolus administration of intravenous contrast. RADIATION DOSE REDUCTION: This exam was performed according to the departmental dose-optimization program which includes automated exposure control, adjustment of the mA and/or kV according to patient size and/or use of iterative reconstruction technique. CONTRAST:  OMNIPAQUE IOHEXOL 300 MG/ML  SOLN COMPARISON:  05/17/2019 CT and prior studies FINDINGS: Lower chest: No acute abnormality. Hepatobiliary: There are multiple low-density lesions within both the RIGHT and LEFT liver, with index 1.5 x 2 cm lesion and 1.5 x 2.5 cm lesion at the dome (image 10: Series 2). Gallbladder wall thickening and cholelithiasis noted. No definite biliary dilatation identified. Pancreas: Unremarkable Spleen: Unremarkable Adrenals/Urinary Tract: Punctate nonobstructing bilateral renal calculi are again identified without other significant change. There is no evidence of suspicious renal mass hydronephrosis. The adrenal glands and bladder are unremarkable. Stomach/Bowel: No definite bowel wall thickening noted. There is no evidence of bowel obstruction or inflammatory changes. Vascular/Lymphatic: Periportal, mesenteric, periaortic and retroperitoneal  adenopathy identified with the index 1.6 x 3.9 cm portacaval node (22:2) identified. Aortic atherosclerotic calcifications noted without aneurysm. Reproductive: Uterus and adnexal regions are unchanged with ureteral calcifications/fibroids. Other: No ascites, focal collection or pneumoperitoneum. Musculoskeletal: No acute or suspicious bony abnormalities are noted. Moderate degenerative disc disease/spondylosis at T12-L1 and L4-5 again noted. IMPRESSION: 1. Multiple low-density lesions throughout the liver and abdominal/retroperitoneal lymphadenopathy, highly suspicious for malignancy/metastatic disease. Primary malignancy is not identified on this examination. 2. Gallbladder wall thickening and cholelithiasis without definite biliary dilatation. If there is strong clinical suspicion for acute cholecystitis, recommend ultrasound or nuclear medicine study. 3. Punctate nonobstructing bilateral renal calculi. 4.  Aortic Atherosclerosis (ICD10-I70.0). Electronically Signed   By: Harmon Pier M.D.   On: 12/20/2022 15:38   DG  Chest Port 1 View  Result Date: 12/20/2022 CLINICAL DATA:  Nausea and vomiting.  Melena.  Sepsis. EXAM: PORTABLE CHEST 1 VIEW COMPARISON:  10/10/2020 FINDINGS: Patient is rotated to the left. Heart size is within normal limits. Aortic atherosclerotic calcification incidentally noted. Both lungs are clear. IMPRESSION: No active disease. Electronically Signed   By: Danae Orleans M.D.   On: 12/20/2022 13:55    EKG: Independently reviewed.   Assessment/Plan Principal Problem:   Sepsis (HCC)    Sepsis with N/V/D, no ab pain With fever, leukocytosis, lactic acidosis, possible intra-abdominal source? Gallbladder ? Continue ivf,  Levaquin and Flagyl in the ED,  ( h/o PCN allergy) Collect stool sample, f/u blood culture result Full liquid diet as tolerated for now, prn antiemetics, npo after midnight in case needing surgery, surgery will see tomorrow on 5/19 per  Dr. Donell Beers  Symptomatic  gallstone? Reports taking pepto bismuth q2-3days for  upset stomach Full liquid for now, npo after midnight, gen surg (Dr. Donell Beers) consulted   Metastatic malignancy? Multiple liver mets and peritoneal mets No colonoscopy record found, check afp/cea May need biopsy  Elevated LFT from infecton vs lives lesions Trend  Hyponatremia, likely from dehydration, continue hydration, recheck lab in the morning  AKI, from sepsis and dehydration, UA no sign of infection, continue hydration/antibiotics, renal dosing meds, repeat BMP in the morning  IDDM2 Hold long-acting insulin, start Lasix by  HLD, hold statin for now due to elevated LFT  Body mass index is 42.51 kg/m.  Meet class III obesity criteria    DVT prophylaxis: enoxaparin (LOVENOX) injection 40 mg Start: 12/20/22 2200 SCDs Start: 12/20/22 [id number] for now in case procedure needed    Code Status:  General surgery Dr. Donell Beers  Family Communication:  Sister at bedside  Patient is from:    Anticipated DC to:    Anticipated DC date:     Consults called:  Gen surg Admission status:  Inpatient  Severity of Illness:  The appropriate patient status for this patient is INPATIENT due to history and comorbidities, severity of illness, required intensity of service to ensure the patient's safety and to avoid risk of adverse events/further clinical deterioration.  Severity of illness/comorbidities: Sepsis, symptomatic gallstone, possible metastatic disease Intensity of service: tests, high frequency of surveillance, interventions It is not anticipated that the patient will be medically stable for discharge from the hospital within 2 midnights of admission.    Voice Recognition Reubin [employer] dictation system was used to create this note, attempts have been made to correct errors. Please contact the author with questions and/or clarifications.  Albertine Grates MD PhD FACP [facility]  How to contact the [facility] Attending or Consulting provider  7A - 7P or covering provider during after hours 7P -7A, for this patient?   Check the care team in [facility] and look for a) attending/consulting [facility] provider listed and b) the [facility] team listed Log into [email or web address] and use [facility]'s universal password to access. If you do not have the password, please contact the hospital operator. Locate the [facility] provider you are looking for under [facility] and page to a number that you can be directly reached. If you still have difficulty reaching the provider, please page the [facility] (Director on Call) for the Hospitalists listed on amion for assistance.  12/20/2022, 9:06 PM

## 2022-12-20 NOTE — ED Notes (Signed)
ED TO INPATIENT HANDOFF REPORT  ED Nurse Name and Phone #: Huntley Dec [phone]  S Name/Age/Gender Regina Mckay 72 y.o. female Room/Bed: WA18/WA18  Code Status   Code Status: Prior  Home/SNF/Other Patient oriented to: self, place, time, and situation Is this baseline? Yes   Triage Complete: Triage complete  Chief Complaint Sepsis [facility]) [A41.9]  Triage Note Yesterday developed N/V/ D. Stools are reported black in color. Last food intake yesterday.   Allergies Allergies  Allergen Reactions   Amoxicillin Shortness Of Breath   Penicillins Shortness Of Breath    Did it involve swelling of the face/tongue/throat, SOB, or low BP? Yes Did it involve sudden or severe rash/hives, skin peeling, or any reaction on the inside of your mouth or nose? No Did you need to seek medical attention at a hospital or doctor's office? Yes When did it last happen?       If all above answers are "NO", may proceed with cephalosporin use.     Level of Care/Admitting Diagnosis ED Disposition     ED Disposition  Admit   Condition  --   Comment  Hospital Area: [facility] [[id number]]  Level of Care: Progressive [102]  Admit to Progressive based on following criteria: MULTISYSTEM THREATS such as stable sepsis, metabolic/electrolyte imbalance with or without encephalopathy that is responding to early treatment.  May admit patient to [facility] or [facility] if equivalent level of care is available:: No  Covid Evaluation: Confirmed COVID Negative  Diagnosis: Sepsis [facility]) [[id number]]  Admitting Physician: Albertine Grates [[id number]]  Attending Physician: Albertine Grates [[id number]]  Certification:: I certify this patient will need inpatient services for at least 2 midnights  Estimated Length of Stay: 2          B Medical/Surgery History Past Medical History:  Diagnosis Date   Diabetes mellitus without complication (HCC)    Hypertension    Past Surgical History:  Procedure Laterality Date    APPLICATION OF CRANIAL NAVIGATION N/A 10/11/2020   Procedure: APPLICATION OF CRANIAL NAVIGATION;  Surgeon: Bedelia Person, MD;  Location: [facility] OR;  Service: Neurosurgery;  Laterality: N/A;   CRANIOTOMY N/A 10/11/2020   Procedure: CRANIOTOMY FOR RESECTION OF MENINGIOMA;  Surgeon: Bedelia Person, MD;  Location: [facility] OR;  Service: Neurosurgery;  Laterality: N/A;   REPLACEMENT TOTAL KNEE     TONSILLECTOMY       A IV Location/Drains/Wounds Patient Lines/Drains/Airways Status     Active Line/Drains/Airways     Name Placement date Placement time Site Days   Peripheral IV 10/14/20 Anterior;Left Forearm 10/14/20  1640  Forearm  797   Peripheral IV 12/20/22 20 G Anterior Antecubital 12/20/22  1314  Antecubital  less than 1   Incision (Closed) 10/11/20 Head Other (Comment) 10/11/20  1844  -- 800            Intake/Output Last 24 hours  Intake/Output Summary (Last 24 hours) at 12/20/2022 1818 Last data filed at 12/20/2022 1515 Gross per 24 hour  Intake 250 ml  Output --  Net 250 ml    Labs/Imaging Results for orders placed or performed during the hospital encounter of 12/20/22 (from the past 48 hour(s))  CBG monitoring, ED     Status: Abnormal   Collection Time: 12/20/22 12:19 PM  Result Value Ref Range   Glucose-Capillary 194 (H) 70 - 99 mg/dL    Comment: Glucose reference range applies only to samples taken after fasting for at least 8 hours.  POC occult blood,  ED     Status: None   Collection Time: 12/20/22 12:23 PM  Result Value Ref Range   Fecal Occult Bld NEGATIVE NEGATIVE  Resp panel by RT-PCR (RSV, Flu A&B, Covid) Anterior Nasal Swab     Status: None   Collection Time: 12/20/22 12:24 PM   Specimen: Anterior Nasal Swab  Result Value Ref Range   SARS Coronavirus 2 by RT PCR NEGATIVE NEGATIVE    Comment: (NOTE) SARS-CoV-2 target nucleic acids are NOT DETECTED.  The SARS-CoV-2 RNA is generally detectable in upper respiratory specimens during the acute phase of infection.  The lowest concentration of SARS-CoV-2 viral copies this assay can detect is 138 copies/mL. A negative result does not preclude SARS-Cov-2 infection and should not be used as the sole basis for treatment or other patient management decisions. A negative result may occur with  improper specimen collection/handling, submission of specimen other than nasopharyngeal swab, presence of viral mutation(s) within the areas targeted by this assay, and inadequate number of viral copies(<138 copies/mL). A negative result must be combined with clinical observations, patient history, and epidemiological information. The expected result is Negative.  Fact Sheet for Patients:  BloggerCourse.com  Fact Sheet for Healthcare Providers:  SeriousBroker.it  This test is no t yet approved or cleared by the [place] FDA and  has been authorized for detection and/or diagnosis of SARS-CoV-2 by FDA under an Emergency Use Authorization (EUA). This EUA will remain  in effect (meaning this test can be used) for the duration of the COVID-19 declaration under Section 564(b)(1) of the Act, 21 U.S.C.section 360bbb-3(b)(1), unless the authorization is terminated  or revoked sooner.       Influenza A by PCR NEGATIVE NEGATIVE   Influenza B by PCR NEGATIVE NEGATIVE    Comment: (NOTE) The Xpert Xpress SARS-CoV-2/FLU/RSV plus assay is intended as an aid in the diagnosis of influenza from Nasopharyngeal swab specimens and should not be used as a sole basis for treatment. Nasal washings and aspirates are unacceptable for Xpert Xpress SARS-CoV-2/FLU/RSV testing.  Fact Sheet for Patients: BloggerCourse.com  Fact Sheet for Healthcare Providers: SeriousBroker.it  This test is not yet approved or cleared by the [place] FDA and has been authorized for detection and/or diagnosis of SARS-CoV-2 by FDA under an  Emergency Use Authorization (EUA). This EUA will remain in effect (meaning this test can be used) for the duration of the COVID-19 declaration under Section 564(b)(1) of the Act, 21 U.S.C. section 360bbb-3(b)(1), unless the authorization is terminated or revoked.     Resp Syncytial Virus by PCR NEGATIVE NEGATIVE    Comment: (NOTE) Fact Sheet for Patients: BloggerCourse.com  Fact Sheet for Healthcare Providers: SeriousBroker.it  This test is not yet approved or cleared by the [place] FDA and has been authorized for detection and/or diagnosis of SARS-CoV-2 by FDA under an Emergency Use Authorization (EUA). This EUA will remain in effect (meaning this test can be used) for the duration of the COVID-19 declaration under Section 564(b)(1) of the Act, 21 U.S.C. section 360bbb-3(b)(1), unless the authorization is terminated or revoked.  Performed at [facility], [address] [place], [place] [address]   Lipase, blood     Status: None   Collection Time: 12/20/22  1:08 PM  Result Value Ref Range   Lipase 25 11 - 51 U/L    Comment: Performed at [facility], [address] [place], [place] [address]  Comprehensive metabolic panel     Status: Abnormal  Collection Time: 12/20/22  1:08 PM  Result Value Ref Range   Sodium 131 (L) 135 - 145 mmol/L   Potassium 4.4 3.5 - 5.1 mmol/L   Chloride 94 (L) 98 - 111 mmol/L   CO2 24 22 - 32 mmol/L   Glucose, Bld 201 (H) 70 - 99 mg/dL    Comment: Glucose reference range applies only to samples taken after fasting for at least 8 hours.   BUN 23 8 - 23 mg/dL   Creatinine, Ser 0.98 (H) 0.44 - 1.00 mg/dL   Calcium 9.0 8.9 - 11.9 mg/dL   Total Protein 7.9 6.5 - 8.1 g/dL   Albumin 3.7 3.5 - 5.0 g/dL   AST 65 (H) 15 - 41 U/L   ALT 86 (H) 0 - 44 U/L   Alkaline Phosphatase 113 38 - 126 U/L   Total Bilirubin 3.7 (H) 0.3 - 1.2 mg/dL   GFR, Estimated 46 (L)  >60 mL/min    Comment: (NOTE) Calculated using the CKD-EPI Creatinine Equation (2021)    Anion gap 13 5 - 15    Comment: Performed at [facility], [address], [place], [place] [address]  CBC     Status: Abnormal   Collection Time: 12/20/22  1:08 PM  Result Value Ref Range   WBC 14.6 (H) 4.0 - 10.5 K/uL   RBC 5.41 (H) 3.87 - 5.11 MIL/uL   Hemoglobin 15.8 (H) 12.0 - 15.0 g/dL   HCT 95.6 (H) 21.3 - 08.6 %   MCV 88.5 80.0 - 100.0 fL   MCH 29.2 26.0 - 34.0 pg   MCHC 33.0 30.0 - 36.0 g/dL   RDW 57.8 46.9 - 62.9 %   Platelets 215 150 - 400 K/uL   nRBC 0.0 0.0 - 0.2 %    Comment: Performed at [facility], [address] [place], [place] [address]  Lactic acid, plasma     Status: Abnormal   Collection Time: 12/20/22  1:08 PM  Result Value Ref Range   Lactic Acid, Venous 2.3 (HH) 0.5 - 1.9 mmol/L    Comment: CRITICAL RESULT CALLED TO, READ BACK BY AND VERIFIED WITH Laurell Roof RN @ 1346 ON 12/20/2022 BY Milly Jakob Performed at [facility], [address] [place], [place] [address]   Protime-INR     Status: None   Collection Time: 12/20/22  1:08 PM  Result Value Ref Range   Prothrombin Time 15.2 11.4 - 15.2 seconds   INR 1.2 0.8 - 1.2    Comment: (NOTE) INR goal varies based on device and disease states. Performed at [facility], [address] [place], [place] [address]   APTT     Status: None   Collection Time: 12/20/22  1:08 PM  Result Value Ref Range   aPTT 25 24 - 36 seconds    Comment: Performed at [facility], [address], [place], [place] [address]  Differential     Status: Abnormal   Collection Time: 12/20/22  1:08 PM  Result Value Ref Range   Neutrophils Relative % 96 %   Neutro Abs 13.8 (H) 1.7 - 7.7 K/uL   Lymphocytes Relative 1 %   Lymphs Abs 0.2 (L) 0.7 - 4.0 K/uL   Monocytes Relative 3 %   Monocytes Absolute 0.4 0.1 - 1.0 K/uL   Eosinophils Relative 0 %    Eosinophils Absolute 0.0 0.0 - 0.5 K/uL   Basophils Relative 0 %   Basophils  Absolute 0.0 0.0 - 0.1 K/uL   Immature Granulocytes 0 %   Abs Immature Granulocytes 0.06 0.00 - 0.07 K/uL    Comment: Performed at [facility], [address], [place], [place] [address]  Lactic acid, plasma     Status: Abnormal   Collection Time: 12/20/22  3:30 PM  Result Value Ref Range   Lactic Acid, Venous 2.2 (HH) 0.5 - 1.9 mmol/L    Comment: CRITICAL RESULT CALLED TO, READ BACK BY AND VERIFIED WITH Clelia Croft, S RN @ 1615 ON 12/20/2022 BY Milly Jakob Performed at [facility], [address] [place], [place] [address]    CT ABDOMEN PELVIS W CONTRAST  Result Date: 12/20/2022 CLINICAL DATA:  72 year old female with acute abdominal and pelvic pain. EXAM: CT ABDOMEN AND PELVIS WITH CONTRAST TECHNIQUE: Multidetector CT imaging of the abdomen and pelvis was performed using the standard protocol following bolus administration of intravenous contrast. RADIATION DOSE REDUCTION: This exam was performed according to the departmental dose-optimization program which includes automated exposure control, adjustment of the mA and/or kV according to patient size and/or use of iterative reconstruction technique. CONTRAST:  OMNIPAQUE IOHEXOL 300 MG/ML  SOLN COMPARISON:  05/17/2019 CT and prior studies FINDINGS: Lower chest: No acute abnormality. Hepatobiliary: There are multiple low-density lesions within both the RIGHT and LEFT liver, with index 1.5 x 2 cm lesion and 1.5 x 2.5 cm lesion at the dome (image 10: Series 2). Gallbladder wall thickening and cholelithiasis noted. No definite biliary dilatation identified. Pancreas: Unremarkable Spleen: Unremarkable Adrenals/Urinary Tract: Punctate nonobstructing bilateral renal calculi are again identified without other significant change. There is no evidence of suspicious renal mass hydronephrosis. The adrenal glands and bladder are unremarkable.  Stomach/Bowel: No definite bowel wall thickening noted. There is no evidence of bowel obstruction or inflammatory changes. Vascular/Lymphatic: Periportal, mesenteric, periaortic and retroperitoneal adenopathy identified with the index 1.6 x 3.9 cm portacaval node (22:2) identified. Aortic atherosclerotic calcifications noted without aneurysm. Reproductive: Uterus and adnexal regions are unchanged with ureteral calcifications/fibroids. Other: No ascites, focal collection or pneumoperitoneum. Musculoskeletal: No acute or suspicious bony abnormalities are noted. Moderate degenerative disc disease/spondylosis at T12-L1 and L4-5 again noted. IMPRESSION: 1. Multiple low-density lesions throughout the liver and abdominal/retroperitoneal lymphadenopathy, highly suspicious for malignancy/metastatic disease. Primary malignancy is not identified on this examination. 2. Gallbladder wall thickening and cholelithiasis without definite biliary dilatation. If there is strong clinical suspicion for acute cholecystitis, recommend ultrasound or nuclear medicine study. 3. Punctate nonobstructing bilateral renal calculi. 4.  Aortic Atherosclerosis (ICD10-I70.0). Electronically Signed   By: Harmon Pier M.D.   On: 12/20/2022 15:38   DG Chest Port 1 View  Result Date: 12/20/2022 CLINICAL DATA:  Nausea and vomiting.  Melena.  Sepsis. EXAM: PORTABLE CHEST 1 VIEW COMPARISON:  10/10/2020 FINDINGS: Patient is rotated to the left. Heart size is within normal limits. Aortic atherosclerotic calcification incidentally noted. Both lungs are clear. IMPRESSION: No active disease. Electronically Signed   By: Danae Orleans M.D.   On: 12/20/2022 13:55    Pending Labs Unresulted Labs (From admission, onward)     Start     Ordered   12/20/22 1300  Urinalysis, Routine w reflex microscopic -  Once,   R        12/20/22 1300   12/20/22 1224  Blood Culture (routine x 2)  (Septic presentation on arrival (screening labs, nursing and treatment orders  for obvious sepsis))  BLOOD CULTURE X 2,   STAT  12/20/22 1224            Vitals/Pain Today's Vitals   12/20/22 1500 12/20/22 1517 12/20/22 1600 12/20/22 1700  BP: 134/73  (!) 146/74 (!) 109/52  Pulse: (!) 109  (!) 106 (!) 101  Resp: 11  17 11   Temp:   (!) 100.7 F (38.2 C)   TempSrc:   Oral   SpO2: (!) 88%  98% 97%  Weight:      Height:      PainSc:  Asleep      Isolation Precautions No active isolations  Medications Medications  levofloxacin (LEVAQUIN) IVPB 750 mg (0 mg Intravenous Stopped 12/20/22 1515)  metroNIDAZOLE (FLAGYL) IVPB 500 mg (0 mg Intravenous Stopped 12/20/22 1500)  sodium chloride 0.9 % bolus 2,000 mL (2,000 mLs Intravenous New Bag/Given 12/20/22 1319)  acetaminophen (TYLENOL) tablet 650 mg (650 mg Oral Given 12/20/22 1318)  iohexol (OMNIPAQUE) 300 MG/ML solution 100 mL (100 mLs Intravenous Contrast Given 12/20/22 1514)    Mobility walks with device     Focused Assessments   R Recommendations: See Admitting Provider Note  Report given to:   Additional Notes:

## 2022-12-20 NOTE — Progress Notes (Signed)
PHARMACY NOTE:  ANTIMICROBIAL RENAL DOSAGE ADJUSTMENT  Current antimicrobial regimen includes a mismatch between antimicrobial dosage and estimated renal function.  As per policy approved by the Pharmacy & Therapeutics and Medical Executive Committees, the antimicrobial dosage will be adjusted accordingly.  Current antimicrobial dosage:  levaquin 750 mg IV q24h  Indication: sepsis  Renal Function:  Estimated Creatinine Clearance: 48.2 mL/min (A) (by C-G formula based on SCr of 1.23 mg/dL (H)). []      On intermittent HD, scheduled: []      On CRRT    Antimicrobial dosage has been changed to:  levaquin 750 mg IV q48h    Thank you for allowing pharmacy to be a part of this patient's care.  Lucia Gaskins, [facility] 12/20/2022 7:36 PM

## 2022-12-20 NOTE — ED Notes (Signed)
Unsuccessful IV attempts x2. Only 1 set of blood cultures obtained. Antibiotics started before 2nd set collected.

## 2022-12-20 NOTE — Progress Notes (Addendum)
A consult was received from an ED physician for Levaquin per pharmacy dosing.  The patient's profile has been reviewed for ht/wt/allergies/indication/available labs.   A one time order has been placed for Levaquin and Flagyl.  Further antibiotics/pharmacy consults should be ordered by admitting physician if indicated.        Of note, has amoxicillin and PCN severe allergy documented. I checked and has no history of a cephalosporin administration.                 Thank you, Armandina Stammer 12/20/2022  1:21 PM

## 2022-12-20 NOTE — Sepsis Progress Note (Signed)
Elink following code sepsis °

## 2022-12-21 ENCOUNTER — Encounter (HOSPITAL_COMMUNITY): Payer: Self-pay | Admitting: Internal Medicine

## 2022-12-21 ENCOUNTER — Inpatient Hospital Stay (HOSPITAL_COMMUNITY): Payer: Medicare Other

## 2022-12-21 DIAGNOSIS — A419 Sepsis, unspecified organism: Secondary | ICD-10-CM | POA: Diagnosis not present

## 2022-12-21 LAB — CBC WITH DIFFERENTIAL/PLATELET
Abs Immature Granulocytes: 0.06 10*3/uL (ref 0.00–0.07)
Basophils Absolute: 0 10*3/uL (ref 0.0–0.1)
Basophils Relative: 0 %
Eosinophils Absolute: 0 10*3/uL (ref 0.0–0.5)
Eosinophils Relative: 0 %
HCT: 40.5 % (ref 36.0–46.0)
Hemoglobin: 13 g/dL (ref 12.0–15.0)
Immature Granulocytes: 1 %
Lymphocytes Relative: 3 %
Lymphs Abs: 0.3 10*3/uL — ABNORMAL LOW (ref 0.7–4.0)
MCH: 28.9 pg (ref 26.0–34.0)
MCHC: 32.1 g/dL (ref 30.0–36.0)
MCV: 90 fL (ref 80.0–100.0)
Monocytes Absolute: 0.8 10*3/uL (ref 0.1–1.0)
Monocytes Relative: 9 %
Neutro Abs: 8.3 10*3/uL — ABNORMAL HIGH (ref 1.7–7.7)
Neutrophils Relative %: 87 %
Platelets: 165 10*3/uL (ref 150–400)
RBC: 4.5 MIL/uL (ref 3.87–5.11)
RDW: 12.8 % (ref 11.5–15.5)
WBC: 9.5 10*3/uL (ref 4.0–10.5)
nRBC: 0 % (ref 0.0–0.2)

## 2022-12-21 LAB — COMPREHENSIVE METABOLIC PANEL
ALT: 58 U/L — ABNORMAL HIGH (ref 0–44)
AST: 45 U/L — ABNORMAL HIGH (ref 15–41)
Albumin: 2.7 g/dL — ABNORMAL LOW (ref 3.5–5.0)
Alkaline Phosphatase: 78 U/L (ref 38–126)
Anion gap: 7 (ref 5–15)
BUN: 26 mg/dL — ABNORMAL HIGH (ref 8–23)
CO2: 25 mmol/L (ref 22–32)
Calcium: 7.8 mg/dL — ABNORMAL LOW (ref 8.9–10.3)
Chloride: 102 mmol/L (ref 98–111)
Creatinine, Ser: 1.02 mg/dL — ABNORMAL HIGH (ref 0.44–1.00)
GFR, Estimated: 58 mL/min — ABNORMAL LOW (ref >60)
Glucose, Bld: 157 mg/dL — ABNORMAL HIGH (ref 70–99)
Potassium: 3.8 mmol/L (ref 3.5–5.1)
Sodium: 134 mmol/L — ABNORMAL LOW (ref 135–145)
Total Bilirubin: 2.2 mg/dL — ABNORMAL HIGH (ref 0.3–1.2)
Total Protein: 6.2 g/dL — ABNORMAL LOW (ref 6.5–8.1)

## 2022-12-21 LAB — BLOOD CULTURE ID PANEL (REFLEXED) - BCID2

## 2022-12-21 LAB — C DIFFICILE QUICK SCREEN W PCR REFLEX
C Diff antigen: NEGATIVE
C Diff interpretation: NOT DETECTED
C Diff toxin: NEGATIVE

## 2022-12-21 LAB — GLUCOSE, CAPILLARY
Glucose-Capillary: 120 mg/dL — ABNORMAL HIGH (ref 70–99)
Glucose-Capillary: 82 mg/dL (ref 70–99)
Glucose-Capillary: 148 mg/dL — ABNORMAL HIGH (ref 70–99)

## 2022-12-21 LAB — CULTURE, BLOOD (ROUTINE X 2)

## 2022-12-21 MED ORDER — IOHEXOL 300 MG/ML  SOLN
75.0000 mL | Freq: Once | INTRAMUSCULAR | Status: AC | PRN
  Administered 2022-12-21: 75 mL via INTRAVENOUS

## 2022-12-21 MED ORDER — SODIUM CHLORIDE (PF) 0.9 % IJ SOLN
INTRAMUSCULAR | Status: AC
  Filled 2022-12-21: qty 50

## 2022-12-21 MED ORDER — SODIUM CHLORIDE 0.9 % IV SOLN
2.0000 g | Freq: Three times a day (TID) | INTRAVENOUS | Status: DC
  Administered 2022-12-21 – 2022-12-23 (×6): 2 g via INTRAVENOUS
  Filled 2022-12-21 (×7): qty 10

## 2022-12-21 NOTE — Progress Notes (Signed)
Report received from previous nurse, agreed with nurse assessment of patient and will cont to monitor.

## 2022-12-21 NOTE — Progress Notes (Signed)
  Transition of Care [facility]) Screening Note   Patient Details  Name: Regina Mckay Date of Birth: 09-01-1950   [facility]) CM/SW Contact:    Adrian Prows, RN Phone Number: 12/21/2022, 4:17 PM    Transition of Care Department [facility]) has reviewed patient and no TOC needs have been identified at this time. We will continue to monitor patient advancement through interdisciplinary progression rounds. If new patient transition needs arise, please place a TOC consult.

## 2022-12-21 NOTE — Progress Notes (Signed)
PROGRESS NOTE    Regina Mckay  ZOX:[id number] DOB: August 14, 1950 DOA: 12/20/2022 PCP: [name], NP  Chief Complaint  Patient presents with   Emesis   Nausea   Melena    Brief Narrative:   Regina Mckay is Regina Mckay 72 y.o. female with h/o obesity, HTN, IDDM2 also on Ozempic report started have nausea vomiting diarrhea since Thursday evening, denies Abdo pain, report has been having upset stomach taking Pepto-Bismol every2- 3 days, reports fever at home, denies sick contact   She's been admitted with sepsis, now found to have bacteremia.  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 Multiple liver mets and peritoneal mets CEA, AFP pending CT chest pending    Elevated LFTs  Hyperbilirubinemia Follow RUQ [place] Improving today, will follow    Hyponatremia Mild, will follow    AKI mild   IDDM2 A1c 5.8 11/2022 SSI    HLD, hold statin for now due to elevated LFT   Body mass index is 42.51 kg/m.  Meet class III obesity criteria    DVT prophylaxis: lovenox Code Status: full Family Communication: none Disposition:   Status is: Inpatient Remains inpatient appropriate because: need for IV abx, further w/u   Consultants:  surgery  Procedures:  none  Antimicrobials:  Anti-infectives (From admission, onward)    Start     Dose/Rate Route Frequency Ordered Stop   12/22/22 1300  levofloxacin (LEVAQUIN) IVPB 750 mg  Status:   Discontinued        750 mg 100 mL/hr over 90 Minutes Intravenous Every 48 hours 12/20/22 1938 12/21/22 0729   12/21/22 1400  levofloxacin (LEVAQUIN) IVPB 750 mg  Status:  Discontinued        750 mg 100 mL/hr over 90 Minutes Intravenous Every 24 hours 12/20/22 1833 12/20/22 1938   12/21/22 1400  aztreonam (AZACTAM) 2 g in sodium chloride 0.9 % 100 mL IVPB        2 g 200 mL/hr over 30 Minutes Intravenous Every 8 hours 12/21/22 0729     12/21/22 0200  metroNIDAZOLE (FLAGYL) IVPB 500 mg        500 mg 100 mL/hr over 60 Minutes Intravenous Every 12 hours 12/20/22 1833     12/20/22 1230  levofloxacin (LEVAQUIN) IVPB 750 mg        750 mg 100 mL/hr over 90 Minutes Intravenous  Once 12/20/22 1224 12/20/22 1515   12/20/22 1230  metroNIDAZOLE (FLAGYL) IVPB 500 mg        500 mg 100 mL/hr over 60 Minutes Intravenous  Once 12/20/22 1224 12/20/22 1500       Subjective: Feels better today  Objective: Vitals:   12/21/22 0004 12/21/22 0400 12/21/22 0735 12/21/22 1143  BP: (!) 120/59 (!) 141/67 132/66 (!) 148/68  Pulse: 90 78 79 72  Resp: 17 18 18 18   Temp: 99.1 F (37.3 C) 98.6 F (37 C) 98.4 F (36.9 C) 98.5 F (36.9 C)  TempSrc:  Oral Oral Oral Oral  SpO2: 97% 100% 99% 100%  Weight:      Height:        Intake/Output Summary (Last 24 hours) at 12/21/2022 1357 Last data filed at 12/21/2022 1210 Gross per 24 hour  Intake 859.94 ml  Output 200 ml  Net 659.94 ml   Filed Weights   12/20/22 1210  Weight: 108.9 kg    Examination:  General exam: Appears calm and comfortable  Respiratory system: unlabored Cardiovascular system: RRR Gastrointestinal system: Abdomen is nondistended, soft and nontender.  Central nervous system: Alert and oriented. No focal neurological deficits. Extremities: no LEE    Data Reviewed: I have personally reviewed following labs and imaging studies  CBC: Recent Labs  Lab 12/20/22 1308 12/21/22 0344  WBC 14.6* 9.5  NEUTROABS 13.8* 8.3*  HGB 15.8*  13.0  HCT 47.9* 40.5  MCV 88.5 90.0  PLT 215 165    Basic Metabolic Panel: Recent Labs  Lab 12/20/22 1308 12/21/22 0344  NA 131* 134*  K 4.4 3.8  CL 94* 102  CO2 24 25  GLUCOSE 201* 157*  BUN 23 26*  CREATININE 1.23* 1.02*  CALCIUM 9.0 7.8*    GFR: Estimated Creatinine Clearance: 58.2 mL/min (Zamariyah Furukawa) (by C-G formula based on SCr of 1.02 mg/dL (H)).  Liver Function Tests: Recent Labs  Lab 12/20/22 1308 12/21/22 0344  AST 65* 45*  ALT 86* 58*  ALKPHOS 113 78  BILITOT 3.7* 2.2*  PROT 7.9 6.2*  ALBUMIN 3.7 2.7*    CBG: Recent Labs  Lab 12/20/22 1219 12/20/22 2120 12/21/22 0731 12/21/22 1139  GLUCAP 194* 187* 120* 82     Recent Results (from the past 240 hour(s))  Resp panel by RT-PCR (RSV, Flu Tabitha Tupper&B, Covid) Anterior Nasal Swab     Status: None   Collection Time: 12/20/22 12:24 PM   Specimen: Anterior Nasal Swab  Result Value Ref Range Status   SARS Coronavirus 2 by RT PCR NEGATIVE NEGATIVE Final    Comment: (NOTE) SARS-CoV-2 target nucleic acids are NOT DETECTED.  The SARS-CoV-2 RNA is generally detectable in upper respiratory specimens during the acute phase of infection. The lowest concentration of SARS-CoV-2 viral copies this assay can detect is 138 copies/mL. Moet Mikulski negative result does not preclude SARS-Cov-2 infection and should not be used as the sole basis for treatment or other patient management decisions. Maricus Tanzi negative result may occur with  improper specimen collection/handling, submission of specimen other than nasopharyngeal swab, presence of viral mutation(s) within the areas targeted by this assay, and inadequate number of viral copies(<138 copies/mL). Caramia Boutin negative result must be combined with clinical observations, patient history, and epidemiological information. The expected result is Negative.  Fact Sheet for Patients:  BloggerCourse.com  Fact Sheet for Healthcare Providers:   SeriousBroker.it  This test is no t yet approved or cleared by the [place] FDA and  has been authorized for detection and/or diagnosis of SARS-CoV-2 by FDA under an Emergency Use Authorization (EUA). This EUA will remain  in effect (meaning this test can be used) for the duration of the COVID-19 declaration under Section 564(b)(1) of the Act, 21 U.S.C.section 360bbb-3(b)(1), unless the authorization is terminated  or revoked sooner.       Influenza Maikel Neisler by PCR NEGATIVE NEGATIVE Final   Influenza B by PCR NEGATIVE NEGATIVE Final    Comment: (NOTE) The Xpert Xpress SARS-CoV-2/FLU/RSV plus assay is intended as an aid in the diagnosis of influenza from Nasopharyngeal swab specimens and should not be used  as Akanksha Bellmore sole basis for treatment. Nasal washings and aspirates are unacceptable for Xpert Xpress SARS-CoV-2/FLU/RSV testing.  Fact Sheet for Patients: BloggerCourse.com  Fact Sheet for Healthcare Providers: SeriousBroker.it  This test is not yet approved or cleared by the [place] FDA and has been authorized for detection and/or diagnosis of SARS-CoV-2 by FDA under an Emergency Use Authorization (EUA). This EUA will remain in effect (meaning this test can be used) for the duration of the COVID-19 declaration under Section 564(b)(1) of the Act, 21 U.S.C. section 360bbb-3(b)(1), unless the authorization is terminated or revoked.     Resp Syncytial Virus by PCR NEGATIVE NEGATIVE Final    Comment: (NOTE) Fact Sheet for Patients: BloggerCourse.com  Fact Sheet for Healthcare Providers: SeriousBroker.it  This test is not yet approved or cleared by the [place] FDA and has been authorized for detection and/or diagnosis of SARS-CoV-2 by FDA under an Emergency Use Authorization (EUA). This EUA will remain in effect (meaning this test can be used) for  the duration of the COVID-19 declaration under Section 564(b)(1) of the Act, 21 U.S.C. section 360bbb-3(b)(1), unless the authorization is terminated or revoked.  Performed at [facility], [address], [place], [place] [address]   Blood Culture (routine x 2)     Status: None (Preliminary result)   Collection Time: 12/20/22  1:08 PM   Specimen: BLOOD  Result Value Ref Range Status   Specimen Description   Final    BLOOD LEFT ANTECUBITAL Performed at [facility], [address], [place], [place] [address]    Special Requests   Final    BOTTLES DRAWN AEROBIC AND ANAEROBIC Blood Culture adequate volume Performed at [facility], [address], [place], [place] [address]    Culture  Setup Time   Final    GRAM NEGATIVE RODS IN BOTH AEROBIC AND ANAEROBIC BOTTLES CRITICAL RESULT CALLED TO, READ BACK BY AND VERIFIED WITH: PHARMD E. JACKSON 12/21/22 @ 0645 BY AB Performed at [facility], [address], [place], [place] [address]    Culture GRAM NEGATIVE RODS  Final   Report Status PENDING  Incomplete  Blood Culture ID Panel (Reflexed)     Status: Abnormal   Collection Time: 12/20/22  1:08 PM  Result Value Ref Range Status   Enterococcus faecalis NOT DETECTED NOT DETECTED Final   Enterococcus Faecium NOT DETECTED NOT DETECTED Final   Listeria monocytogenes NOT DETECTED NOT DETECTED Final   Staphylococcus species NOT DETECTED NOT DETECTED Final   Staphylococcus aureus (BCID) NOT DETECTED NOT DETECTED Final   Staphylococcus epidermidis NOT DETECTED NOT DETECTED Final   Staphylococcus lugdunensis NOT DETECTED NOT DETECTED Final   Streptococcus species NOT DETECTED NOT DETECTED Final   Streptococcus agalactiae NOT DETECTED NOT DETECTED Final   Streptococcus pneumoniae NOT DETECTED NOT DETECTED Final   Streptococcus pyogenes NOT DETECTED NOT DETECTED Final   Whitman Meinhardt.calcoaceticus-baumannii NOT DETECTED NOT DETECTED Final    Bacteroides fragilis NOT DETECTED NOT DETECTED Final   Enterobacterales DETECTED (Valarie Farace) NOT DETECTED Final    Comment: Enterobacterales represent Gracynn Rajewski large order of gram negative bacteria, not Raeden Belzer single organism. CRITICAL RESULT CALLED TO, READ BACK BY AND VERIFIED WITH: PHARMD E. JACKSON 12/21/22 @ 0645 BY AB    Enterobacter cloacae complex NOT DETECTED NOT DETECTED Final   Escherichia coli NOT DETECTED NOT DETECTED Final   Klebsiella aerogenes NOT DETECTED NOT DETECTED Final   Klebsiella oxytoca NOT DETECTED NOT DETECTED Final   Klebsiella pneumoniae  DETECTED (Zed Wanninger) NOT DETECTED Final    Comment: CRITICAL RESULT CALLED TO, READ BACK BY AND VERIFIED WITH: PHARMD E. JACKSON 12/21/22 @ 0645 BY AB    Proteus species NOT DETECTED NOT DETECTED Final   Salmonella species NOT DETECTED NOT DETECTED Final   Serratia marcescens NOT DETECTED NOT DETECTED Final   Haemophilus influenzae NOT DETECTED NOT DETECTED Final   Neisseria meningitidis NOT DETECTED NOT DETECTED Final   Pseudomonas aeruginosa NOT DETECTED NOT DETECTED Final   Stenotrophomonas maltophilia NOT DETECTED NOT DETECTED Final   Candida albicans NOT DETECTED NOT DETECTED Final   Candida auris NOT DETECTED NOT DETECTED Final   Candida glabrata NOT DETECTED NOT DETECTED Final   Candida krusei NOT DETECTED NOT DETECTED Final   Candida parapsilosis NOT DETECTED NOT DETECTED Final   Candida tropicalis NOT DETECTED NOT DETECTED Final   Cryptococcus neoformans/gattii NOT DETECTED NOT DETECTED Final   CTX-M ESBL NOT DETECTED NOT DETECTED Final   Carbapenem resistance IMP NOT DETECTED NOT DETECTED Final   Carbapenem resistance KPC NOT DETECTED NOT DETECTED Final   Carbapenem resistance NDM NOT DETECTED NOT DETECTED Final   Carbapenem resist OXA 48 LIKE NOT DETECTED NOT DETECTED Final   Carbapenem resistance VIM NOT DETECTED NOT DETECTED Final    Comment: Performed at [facility], [address], [place], [place] [address]  Blood Culture  (routine x 2)     Status: None (Preliminary result)   Collection Time: 12/20/22  7:33 PM   Specimen: BLOOD RIGHT ARM  Result Value Ref Range Status   Specimen Description BLOOD RIGHT ARM  Final   Special Requests   Final    BOTTLES DRAWN AEROBIC AND ANAEROBIC Blood Culture adequate volume   Culture   Final    NO GROWTH < 12 HOURS Performed at [facility], [address] [place], [place] [address]    Report Status PENDING  Incomplete         Radiology Studies: US Abdomen Limited RUQ (LIVER/GB)  Result Date: 12/21/2022 CLINICAL DATA:  Abdominal pain EXAM: ULTRASOUND ABDOMEN LIMITED RIGHT UPPER QUADRANT COMPARISON:  CT from the previous day FINDINGS: Gallbladder: Gallbladder incompletely distended. Mild gallbladder wall thickening up to 6 mm. To nerve root reports no sonographic Murphy sign however. Several gallstones measuring up to 8 mm in the lumen. Common bile duct: Diameter: 4 mm.  No intrahepatic biliary ductal dilatation. Liver: Scattered echogenic lesions corresponding to CT findings. Portal vein is patent on color Doppler imaging with normal direction of blood flow towards the liver. Other: Technologist describes technically difficult study secondary to patient breathing, overlying bowel, body habitus. IMPRESSION: 1. Cholelithiasis with mild gallbladder wall thickening. No sonographic Murphy sign. 2. No biliary dilatation. 3. Scattered echogenic liver lesions corresponding to CT findings, presumed metastatic disease. Electronically Signed   By: Corlis Leak M.D.   On: 12/21/2022 11:16   CT ABDOMEN PELVIS W CONTRAST  Result Date: 12/20/2022 CLINICAL DATA:  72 year old female with acute abdominal and pelvic pain. EXAM: CT ABDOMEN AND PELVIS WITH CONTRAST TECHNIQUE: Multidetector CT imaging of the abdomen and pelvis was performed using the standard protocol following bolus administration of intravenous contrast. RADIATION DOSE REDUCTION: This exam was performed according to the  departmental dose-optimization program which includes automated exposure control, adjustment of the mA and/or kV according to patient size and/or use of iterative reconstruction technique. CONTRAST:  OMNIPAQUE IOHEXOL 300 MG/ML  SOLN COMPARISON:  05/17/2019 CT and prior studies FINDINGS: Lower chest: No acute abnormality.  Hepatobiliary: There are multiple low-density lesions within both the RIGHT and LEFT liver, with index 1.5 x 2 cm lesion and 1.5 x 2.5 cm lesion at the dome (image 10: Series 2). Gallbladder wall thickening and cholelithiasis noted. No definite biliary dilatation identified. Pancreas: Unremarkable Spleen: Unremarkable Adrenals/Urinary Tract: Punctate nonobstructing bilateral renal calculi are again identified without other significant change. There is no evidence of suspicious renal mass hydronephrosis. The adrenal glands and bladder are unremarkable. Stomach/Bowel: No definite bowel wall thickening noted. There is no evidence of bowel obstruction or inflammatory changes. Vascular/Lymphatic: Periportal, mesenteric, periaortic and retroperitoneal adenopathy identified with the index 1.6 x 3.9 cm portacaval node (22:2) identified. Aortic atherosclerotic calcifications noted without aneurysm. Reproductive: Uterus and adnexal regions are unchanged with ureteral calcifications/fibroids. Other: No ascites, focal collection or pneumoperitoneum. Musculoskeletal: No acute or suspicious bony abnormalities are noted. Moderate degenerative disc disease/spondylosis at T12-L1 and L4-5 again noted. IMPRESSION: 1. Multiple low-density lesions throughout the liver and abdominal/retroperitoneal lymphadenopathy, highly suspicious for malignancy/metastatic disease. Primary malignancy is not identified on this examination. 2. Gallbladder wall thickening and cholelithiasis without definite biliary dilatation. If there is strong clinical suspicion for acute cholecystitis, recommend ultrasound or nuclear medicine  study. 3. Punctate nonobstructing bilateral renal calculi. 4.  Aortic Atherosclerosis (ICD10-I70.0). Electronically Signed   By: Harmon Pier M.D.   On: 12/20/2022 15:38   DG Chest Port 1 View  Result Date: 12/20/2022 CLINICAL DATA:  Nausea and vomiting.  Melena.  Sepsis. EXAM: PORTABLE CHEST 1 VIEW COMPARISON:  10/10/2020 FINDINGS: Patient is rotated to the left. Heart size is within normal limits. Aortic atherosclerotic calcification incidentally noted. Both lungs are clear. IMPRESSION: No active disease. Electronically Signed   By: Danae Orleans M.D.   On: 12/20/2022 13:55        Scheduled Meds:  cholecalciferol  400 Units Oral Daily   enoxaparin (LOVENOX) injection  40 mg Subcutaneous Q24H   insulin aspart  0-15 Units Subcutaneous TID WC   Continuous Infusions:  sodium chloride 75 mL/hr at 12/20/22 2023   aztreonam     metronidazole 500 mg (12/21/22 0310)     LOS: 1 day    Time spent: over 30 min    Lacretia Nicks, MD [facility]   To contact the attending provider between 7A-7P or the covering provider during after hours 7P-7A, please log into the web site [email or web address] and access using universal [place] password for that web site. If you do not have the password, please call the hospital operator.  12/21/2022, 1:57 PM

## 2022-12-21 NOTE — Progress Notes (Signed)
PHARMACY - PHYSICIAN COMMUNICATION CRITICAL VALUE ALERT - BLOOD CULTURE IDENTIFICATION (BCID)  Regina Mckay is an 72 y.o. female who presented to [facility] on 12/20/2022 with a chief complaint of nausea, vomiting, and diarrhea.  Assessment:  72 yo F presents with N/V/D and abdominal pain on 5/18. Found to be septic with fever, leukocytosis, and elevated LA. CT imaging showed possible cholecystitis and low density lesions throughout liver.   Severe allergy to PCN/amoxicillin documented and no hx of cephalosporin use.  Name of physician (or Provider) Contacted: Dr. Lowell Guitar  Current antibiotics: Levofloxacin and metronidazole  Changes to prescribed antibiotics recommended:  Stop levofloxacin Start aztreonam 2g IV Q8h Continue metronidazole 500mg  IV Q12h  Results for orders placed or performed during the hospital encounter of 12/20/22  Blood Culture ID Panel (Reflexed) (Collected: 12/20/2022  1:08 PM)  Result Value Ref Range   Enterococcus faecalis NOT DETECTED NOT DETECTED   Enterococcus Faecium NOT DETECTED NOT DETECTED   Listeria monocytogenes NOT DETECTED NOT DETECTED   Staphylococcus species NOT DETECTED NOT DETECTED   Staphylococcus aureus (BCID) NOT DETECTED NOT DETECTED   Staphylococcus epidermidis NOT DETECTED NOT DETECTED   Staphylococcus lugdunensis NOT DETECTED NOT DETECTED   Streptococcus species NOT DETECTED NOT DETECTED   Streptococcus agalactiae NOT DETECTED NOT DETECTED   Streptococcus pneumoniae NOT DETECTED NOT DETECTED   Streptococcus pyogenes NOT DETECTED NOT DETECTED   A.calcoaceticus-baumannii NOT DETECTED NOT DETECTED   Bacteroides fragilis NOT DETECTED NOT DETECTED   Enterobacterales DETECTED (A) NOT DETECTED   Enterobacter cloacae complex NOT DETECTED NOT DETECTED   Escherichia coli NOT DETECTED NOT DETECTED   Klebsiella aerogenes NOT DETECTED NOT DETECTED   Klebsiella oxytoca NOT DETECTED NOT DETECTED   Klebsiella pneumoniae DETECTED (A) NOT DETECTED    Proteus species NOT DETECTED NOT DETECTED   Salmonella species NOT DETECTED NOT DETECTED   Serratia marcescens NOT DETECTED NOT DETECTED   Haemophilus influenzae NOT DETECTED NOT DETECTED   Neisseria meningitidis NOT DETECTED NOT DETECTED   Pseudomonas aeruginosa NOT DETECTED NOT DETECTED   Stenotrophomonas maltophilia NOT DETECTED NOT DETECTED   Candida albicans NOT DETECTED NOT DETECTED   Candida auris NOT DETECTED NOT DETECTED   Candida glabrata NOT DETECTED NOT DETECTED   Candida krusei NOT DETECTED NOT DETECTED   Candida parapsilosis NOT DETECTED NOT DETECTED   Candida tropicalis NOT DETECTED NOT DETECTED   Cryptococcus neoformans/gattii NOT DETECTED NOT DETECTED   CTX-M ESBL NOT DETECTED NOT DETECTED   Carbapenem resistance IMP NOT DETECTED NOT DETECTED   Carbapenem resistance KPC NOT DETECTED NOT DETECTED   Carbapenem resistance NDM NOT DETECTED NOT DETECTED   Carbapenem resist OXA 48 LIKE NOT DETECTED NOT DETECTED   Carbapenem resistance VIM NOT DETECTED NOT DETECTED   Enzo Bi, PharmD, BCPS, BCIDP Clinical Pharmacist 12/21/2022 7:30 AM

## 2022-12-22 ENCOUNTER — Inpatient Hospital Stay (HOSPITAL_COMMUNITY): Payer: Medicare Other

## 2022-12-22 DIAGNOSIS — A419 Sepsis, unspecified organism: Secondary | ICD-10-CM | POA: Diagnosis not present

## 2022-12-22 LAB — GASTROINTESTINAL PANEL BY PCR, STOOL (REPLACES STOOL CULTURE)

## 2022-12-22 LAB — GLUCOSE, CAPILLARY
Glucose-Capillary: 97 mg/dL (ref 70–99)
Glucose-Capillary: 126 mg/dL — ABNORMAL HIGH (ref 70–99)
Glucose-Capillary: 103 mg/dL — ABNORMAL HIGH (ref 70–99)
Glucose-Capillary: 100 mg/dL — ABNORMAL HIGH (ref 70–99)

## 2022-12-22 LAB — CULTURE, BLOOD (ROUTINE X 2)

## 2022-12-22 MED ORDER — OXYCODONE HCL 5 MG PO TABS
2.5000 mg | ORAL_TABLET | ORAL | Status: DC | PRN
  Administered 2022-12-23 – 2022-12-24 (×2): 2.5 mg via ORAL
  Filled 2022-12-22 (×2): qty 1

## 2022-12-22 MED ORDER — HYDROCODONE-ACETAMINOPHEN 5-325 MG PO TABS
1.0000 | ORAL_TABLET | Freq: Once | ORAL | Status: AC
  Administered 2022-12-22: 1 via ORAL
  Filled 2022-12-22: qty 1

## 2022-12-22 MED ORDER — GADOBUTROL 1 MMOL/ML IV SOLN
10.0000 mL | Freq: Once | INTRAVENOUS | Status: AC | PRN
  Administered 2022-12-22: 10 mL via INTRAVENOUS

## 2022-12-22 MED ORDER — LORAZEPAM 2 MG/ML IJ SOLN
1.0000 mg | Freq: Once | INTRAMUSCULAR | Status: AC | PRN
  Administered 2022-12-22: 1 mg via INTRAVENOUS
  Filled 2022-12-22: qty 1

## 2022-12-22 MED ORDER — ORAL CARE MOUTH RINSE: 15.0000 mL | OROMUCOSAL | Status: DC | PRN

## 2022-12-22 NOTE — Progress Notes (Signed)
Mobility Specialist - Progress Note   12/22/22 1434  Mobility  Activity Ambulated with assistance in hallway  Level of Assistance Independent after set-up  Assistive Device Other (Comment)  Distance Ambulated (ft) 700 ft  Activity Response Tolerated well  Mobility Referral Yes  $Mobility charge 1 Mobility  Mobility Specialist Start Time (ACUTE ONLY) 0221  Mobility Specialist Stop Time (ACUTE ONLY) 0232  Mobility Specialist Time Calculation (min) (ACUTE ONLY) 11 min   Pt received in bed and agreeable to mobility. No complaints during session. Pt to bathroom after session with all needs met.    [facility]

## 2022-12-22 NOTE — Consult Note (Signed)
Chief Complaint: Patient was seen in consultation today for liver lesions  Referring Physician(s): Grier Mitts, MD  Supervising Physician: Roanna Banning  Patient Status: [facility] - In-pt  History of Present Illness: Regina Mckay is a 72 y.o. female with PMH significant for diabetes mellitus and hypertension being seen today in relation to newly discovered liver lesions. Patient presented to [facility] ED on 5/18 with vomiting and diarrhea. CT imaging revealed presence of multiple liver lesions, pulmonary nodules, and lymphadenopathy suspicious for metastatic disease with unknown primary malignancy. IR team has been consulted to evaluate patient for possible image-guided biopsy.  Past Medical History:  Diagnosis Date   Diabetes mellitus without complication (HCC)    Hypertension     Past Surgical History:  Procedure Laterality Date   APPLICATION OF CRANIAL NAVIGATION N/A 10/11/2020   Procedure: APPLICATION OF CRANIAL NAVIGATION;  Surgeon: Bedelia Person, MD;  Location: [facility] OR;  Service: Neurosurgery;  Laterality: N/A;   CRANIOTOMY N/A 10/11/2020   Procedure: CRANIOTOMY FOR RESECTION OF MENINGIOMA;  Surgeon: Bedelia Person, MD;  Location: [facility] OR;  Service: Neurosurgery;  Laterality: N/A;   REPLACEMENT TOTAL KNEE     TONSILLECTOMY      Allergies: Amoxicillin and Penicillins  Medications: Prior to Admission medications   Medication Sig Start Date End Date Taking? Authorizing Provider  acetaminophen (TYLENOL) 500 MG tablet Take 500 mg by mouth every 4 (four) hours as needed for mild pain or moderate pain.   Yes [provider]  amLODipine (NORVASC) 10 MG tablet Take 1 tablet (10 mg total) by mouth daily. Follow-up appt due in July must see provider for future refills 11/06/22  Yes [name], NP  bismuth subsalicylate (PEPTO BISMOL) 262 MG chewable tablet Chew 524 mg by mouth as needed for indigestion or diarrhea or loose stools.   Yes [provider]   cholecalciferol (VITAMIN D3) 10 MCG (400 UNIT) TABS tablet Take 400 Units by mouth daily.   Yes [provider]  Continuous Blood Gluc Receiver (FREESTYLE LIBRE 2 READER) DEVI 1 each by Does not apply route daily. 10/25/21  Yes Carlus Pavlov, MD  Continuous Blood Gluc Sensor (FREESTYLE LIBRE 2 SENSOR) MISC USE 1 SENSOR EVERY 14 DAYS AS DIRECTED Patient taking differently: 1 each by Other route See admin instructions. USE 1 SENSOR EVERY 14 DAYS AS DIRECTED 09/30/22  Yes Carlus Pavlov, MD  Dextrose, Diabetic Use, (GLUCOSE PO) Take 1 tablet by mouth See admin instructions. Patient uses Glucose Tablets with 15 grams of carbohydrates.   Yes [provider]  hydrochlorothiazide (MICROZIDE) 12.5 MG capsule Take 1 capsule (12.5 mg total) by mouth daily. 05/28/22  Yes [name], NP  insulin aspart (NOVOLOG FLEXPEN) 100 UNIT/ML FlexPen Inject 5-7 units under skin up to 2x a day Patient taking differently: Inject 7-9 Units into the skin See admin instructions. Inject 5-7 units under skin up to 2x a day 05/06/22  Yes Carlus Pavlov, MD  Insulin Glargine (BASAGLAR KWIKPEN) 100 UNIT/ML Inject 22 Units into the skin daily. 05/06/22  Yes Carlus Pavlov, MD  Insulin Pen Needle 32G X 4 MM MISC Use 4x a day Patient taking differently: 1 each by Other route See admin instructions. Use 4x a day 09/13/21  Yes Carlus Pavlov, MD  lisinopril (ZESTRIL) 20 MG tablet Take 1 tablet (20 mg total) by mouth daily. Follow-up appt due in July 11/17/22  Yes [name], NP  pravastatin (PRAVACHOL) 40 MG tablet Take 1 tablet (40 mg total)  by mouth daily. 08/25/22  Yes [name], NP  Semaglutide,0.25 or 0.5MG /DOS, 2 MG/3ML SOPN Inject 0.25-0.5 mg into the skin once a week. 11/06/22  Yes Carlus Pavlov, MD  lidocaine (LIDODERM) 5 % PLACE 1 PATCH ONTO THE SKIN DAILY. REMOVE& DISCARD [facility] WITHIN 12 HOURS Patient not taking: Reported on 12/20/2022 08/22/21   Kathryne Hitch, MD     Family  History  Problem Relation Age of Onset   Alcohol abuse Mother    Arthritis Mother    Stroke Mother    Alcohol abuse Father    Arthritis Father    Alcohol abuse Sister    Diabetes Sister    Alcohol abuse Sister    Arthritis Sister    Alcohol abuse Brother    Alcohol abuse Brother    Alcohol abuse Brother    Diabetes Brother    Alcohol abuse Daughter     Social History   Socioeconomic History   Marital status: Divorced    Spouse name: Not on file   Number of children: 1   Years of education: Not on file   Highest education level: Not on file  Occupational History   Occupation: retired   Occupation: Retired from [employer]  Tobacco Use   Smoking status: Former    Types: Cigarettes   Smokeless tobacco: Former    Quit date: 1970  Substance and Sexual Activity   Alcohol use: Not Currently   Drug use: No   Sexual activity: Not Currently  Other Topics Concern   Not on file  Social History Narrative   Not on file   Social Determinants of Health   Financial Resource Strain: Not on file  Food Insecurity: No Food Insecurity (12/22/2022)   Hunger Vital Sign    Worried About Running Out of Food in the Last Year: Never true    Ran Out of Food in the Last Year: Never true  Transportation Needs: No Transportation Needs (12/22/2022)   PRAPARE - Administrator, Civil Service (Medical): No    Lack of Transportation (Non-Medical): No  Physical Activity: Not on file  Stress: Not on file  Social Connections: Not on file    Code Status: Full Code  Review of Systems: A 12 point ROS discussed and pertinent positives are indicated in the HPI above.  All other systems are negative.  Review of Systems  Constitutional:  Positive for fever. Negative for chills.  Respiratory:  Positive for cough. Negative for chest tightness and shortness of breath.   Cardiovascular:  Negative for chest pain and leg swelling.  Gastrointestinal:  Positive for abdominal pain and diarrhea. Negative  for nausea and vomiting.       Patient initially presented with nausea and vomiting, states it is gone since yesterday  Neurological:  Negative for dizziness and headaches.  Psychiatric/Behavioral:  Negative for confusion.     Vital Signs: BP (!) 168/75 (BP Location: Right Arm)   Pulse 82   Temp 98.1 F (36.7 C) (Oral)   Resp 18   Ht 5\' 3"  (1.6 m)   Wt 240 lb (108.9 kg)   SpO2 100%   BMI 42.51 kg/m     Physical Exam Vitals reviewed.  HENT:     Mouth/Throat:     Mouth: Mucous membranes are moist.  Eyes:     Pupils: Pupils are equal, round, and reactive to light.  Cardiovascular:     Rate and Rhythm: Normal rate and regular rhythm.  Pulses: Normal pulses.     Heart sounds: Normal heart sounds.  Pulmonary:     Effort: Pulmonary effort is normal.     Breath sounds: Normal breath sounds.  Abdominal:     Palpations: Abdomen is soft.     Tenderness: There is no abdominal tenderness.  Musculoskeletal:     Right lower leg: No edema.     Left lower leg: No edema.  Skin:    General: Skin is warm and dry.  Neurological:     Mental Status: She is alert and oriented to person, place, and time.  Psychiatric:        Mood and Affect: Mood normal.        Behavior: Behavior normal.        Thought Content: Thought content normal.        Judgment: Judgment normal.     Imaging: CT CHEST W CONTRAST  Result Date: 12/22/2022 CLINICAL DATA:  Inpatient. Abnormal CT abdomen/pelvis study with liver lesions and retroperitoneal adenopathy suspicious for metastatic disease of uncertain primary. * Tracking Code: BO * EXAM: CT CHEST WITH CONTRAST TECHNIQUE: Multidetector CT imaging of the chest was performed during intravenous contrast administration. RADIATION DOSE REDUCTION: This exam was performed according to the departmental dose-optimization program which includes automated exposure control, adjustment of the mA and/or kV according to patient size and/or use of iterative reconstruction  technique. CONTRAST:  75mL OMNIPAQUE IOHEXOL 300 MG/ML  SOLN COMPARISON:  12/20/2022 CT abdomen/pelvis and chest radiograph. FINDINGS: Cardiovascular: Normal heart size. No significant pericardial effusion/thickening. Three-vessel coronary atherosclerosis. Atherosclerotic nonaneurysmal thoracic aorta. Normal caliber pulmonary arteries. No central pulmonary emboli. Mediastinum/Nodes: Hypodense posterior right thyroid 1.5 cm nodule. Unremarkable esophagus. Mild left supraclavicular adenopathy up to 1.0 cm (series 2/image 15). No axillary adenopathy. Mildly enlarged low posterior mediastinal para-lymph nodes up to 1.2 cm on the right (series 2/image 104). No hilar adenopathy. Lungs/Pleura: No pneumothorax. No pleural effusion. No acute consolidative airspace disease or lung masses. At least five solid pulmonary nodules scattered in the lungs bilaterally, largest 0.5 cm in the posteromedial basilar subpleural right lower lobe (series 6/image 107), 0.4 cm in the medial left lower lobe (series 6/image 100) and 0.4 cm in the anterior right middle lobe (series 6/image 70). Upper abdomen: Multiple persistent hypodense superior right liver lesions, poorly defined on this chest CT study. Subcentimeter hypodense posterior upper left renal cortical lesion, too small to characterize, for which no follow-up imaging is recommended. Partially visualized porta hepatis, portacaval and aortocaval adenopathy in the upper abdomen, better seen on CT abdomen study from 1 day prior. Musculoskeletal: No aggressive appearing focal osseous lesions. Marked thoracic degenerative disc disease. IMPRESSION: 1. At least five solid pulmonary nodules scattered in the lungs bilaterally, largest 0.5 cm, equivocal for pulmonary metastases. Recommend attention on follow-up chest CT in 3 months. 2. Mild left supraclavicular and low posterior mediastinal lymphadenopathy, suspicious for nodal metastases. 3. Multiple persistent hypodense superior right liver  lesions and partially visualized porta hepatis, portacaval and aortocaval adenopathy in the upper abdomen, all better seen on CT abdomen study from 1 day prior. 4. Hypodense posterior right thyroid 1.5 cm nodule. Recommend non-emergent thyroid ultrasound. Reference: J Am Coll Radiol. 2015 Feb;12(2): 143-50 5. Three-vessel coronary atherosclerosis. 6.  Aortic Atherosclerosis (ICD10-I70.0). Electronically Signed   By: Delbert Phenix M.D.   On: 12/22/2022 08:00   US Abdomen Limited RUQ (LIVER/GB)  Result Date: 12/21/2022 CLINICAL DATA:  Abdominal pain EXAM: ULTRASOUND ABDOMEN LIMITED RIGHT UPPER QUADRANT COMPARISON:  CT from  the previous day FINDINGS: Gallbladder: Gallbladder incompletely distended. Mild gallbladder wall thickening up to 6 mm. To nerve root reports no sonographic Murphy sign however. Several gallstones measuring up to 8 mm in the lumen. Common bile duct: Diameter: 4 mm.  No intrahepatic biliary ductal dilatation. Liver: Scattered echogenic lesions corresponding to CT findings. Portal vein is patent on color Doppler imaging with normal direction of blood flow towards the liver. Other: Technologist describes technically difficult study secondary to patient breathing, overlying bowel, body habitus. IMPRESSION: 1. Cholelithiasis with mild gallbladder wall thickening. No sonographic Murphy sign. 2. No biliary dilatation. 3. Scattered echogenic liver lesions corresponding to CT findings, presumed metastatic disease. Electronically Signed   By: Corlis Leak M.D.   On: 12/21/2022 11:16   CT ABDOMEN PELVIS W CONTRAST  Result Date: 12/20/2022 CLINICAL DATA:  72 year old female with acute abdominal and pelvic pain. EXAM: CT ABDOMEN AND PELVIS WITH CONTRAST TECHNIQUE: Multidetector CT imaging of the abdomen and pelvis was performed using the standard protocol following bolus administration of intravenous contrast. RADIATION DOSE REDUCTION: This exam was performed according to the departmental dose-optimization  program which includes automated exposure control, adjustment of the mA and/or kV according to patient size and/or use of iterative reconstruction technique. CONTRAST:  OMNIPAQUE IOHEXOL 300 MG/ML  SOLN COMPARISON:  05/17/2019 CT and prior studies FINDINGS: Lower chest: No acute abnormality. Hepatobiliary: There are multiple low-density lesions within both the RIGHT and LEFT liver, with index 1.5 x 2 cm lesion and 1.5 x 2.5 cm lesion at the dome (image 10: Series 2). Gallbladder wall thickening and cholelithiasis noted. No definite biliary dilatation identified. Pancreas: Unremarkable Spleen: Unremarkable Adrenals/Urinary Tract: Punctate nonobstructing bilateral renal calculi are again identified without other significant change. There is no evidence of suspicious renal mass hydronephrosis. The adrenal glands and bladder are unremarkable. Stomach/Bowel: No definite bowel wall thickening noted. There is no evidence of bowel obstruction or inflammatory changes. Vascular/Lymphatic: Periportal, mesenteric, periaortic and retroperitoneal adenopathy identified with the index 1.6 x 3.9 cm portacaval node (22:2) identified. Aortic atherosclerotic calcifications noted without aneurysm. Reproductive: Uterus and adnexal regions are unchanged with ureteral calcifications/fibroids. Other: No ascites, focal collection or pneumoperitoneum. Musculoskeletal: No acute or suspicious bony abnormalities are noted. Moderate degenerative disc disease/spondylosis at T12-L1 and L4-5 again noted. IMPRESSION: 1. Multiple low-density lesions throughout the liver and abdominal/retroperitoneal lymphadenopathy, highly suspicious for malignancy/metastatic disease. Primary malignancy is not identified on this examination. 2. Gallbladder wall thickening and cholelithiasis without definite biliary dilatation. If there is strong clinical suspicion for acute cholecystitis, recommend ultrasound or nuclear medicine study. 3. Punctate nonobstructing  bilateral renal calculi. 4.  Aortic Atherosclerosis (ICD10-I70.0). Electronically Signed   By: Harmon Pier M.D.   On: 12/20/2022 15:38   DG Chest Port 1 View  Result Date: 12/20/2022 CLINICAL DATA:  Nausea and vomiting.  Melena.  Sepsis. EXAM: PORTABLE CHEST 1 VIEW COMPARISON:  10/10/2020 FINDINGS: Patient is rotated to the left. Heart size is within normal limits. Aortic atherosclerotic calcification incidentally noted. Both lungs are clear. IMPRESSION: No active disease. Electronically Signed   By: Danae Orleans M.D.   On: 12/20/2022 13:55    Labs:  CBC: Recent Labs    12/20/22 1308 12/21/22 0344  WBC 14.6* 9.5  HGB 15.8* 13.0  HCT 47.9* 40.5  PLT 215 165    COAGS: Recent Labs    12/20/22 1308  INR 1.2  APTT 25    BMP: Recent Labs    02/07/22 1327 08/14/22 1331 12/20/22 1308 12/21/22 0344  NA 136 139 131* 134*  K 3.9 4.5 4.4 3.8  CL 98 99 94* 102  CO2 33* 31 24 25   GLUCOSE 131* 118* 201* 157*  BUN 21 18 23  26*  CALCIUM 9.4 9.7 9.0 7.8*  CREATININE 1.01 0.90 1.23* 1.02*  GFRNONAA  --   --  46* 58*    LIVER FUNCTION TESTS: Recent Labs    02/07/22 1327 12/20/22 1308 12/21/22 0344  BILITOT 0.7 3.7* 2.2*  AST 23 65* 45*  ALT 19 86* 58*  ALKPHOS 60 113 78  PROT 7.4 7.9 6.2*  ALBUMIN 4.2 3.7 2.7*    TUMOR MARKERS: No results for input(s): "AFPTM", "CEA", "CA199", "CHROMGRNA" in the last 8760 hours.  Assessment and Plan:  Regina Mckay is a 72 yo female being seen today in relation to newly discovered liver lesions suspicious for malignancy. IR has been consulted to evaluate patient for image-guided liver biopsy. Case has been reviewed and approved for image-guided liver biopsy, tentatively for 12/23/22. Patient will be made NPO at midnight and has had her Lovenox held.  Risks and benefits of image-guided liver biopsy were discussed with the patient and/or patient's family including, but not limited to bleeding, infection, damage to adjacent structures or low  yield requiring additional tests.  All of the questions were answered and there is agreement to proceed.  Consent signed and in chart.   Thank you for this interesting consult.  I greatly enjoyed meeting Regina Mckay and look forward to participating in their care.  A copy of this report was sent to the requesting provider on this date.  Electronically Signed: Kennieth Francois, PA-C 12/22/2022, 2:47 PM   I spent a total of 20 Minutes  in face to face in clinical consultation, greater than 50% of which was counseling/coordinating care for liver lesions.

## 2022-12-22 NOTE — Progress Notes (Signed)
PROGRESS NOTE    Regina Mckay  ZOX:[id number] DOB: 1951-04-28 DOA: 12/20/2022 PCP: [name], NP  Chief Complaint  Patient presents with   Emesis   Nausea   Melena    Brief Narrative:   Regina Mckay is Regina Mckay 72 y.o. female with h/o obesity, HTN, IDDM2 also on Ozempic report started have nausea vomiting diarrhea since Thursday evening, denies Abdo pain, report has been having upset stomach taking Pepto-Bismol every2- 3 days, reports fever at home, denies sick contact   She's been admitted with sepsis, now found to have bacteremia.  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 Multiple liver mets and peritoneal mets CT chest with at least 5 solid pulmonary nodules, equivocal for pulm mets, L supraclavicular and low posterior mediastinal LAD, concerning for nodal mets CEA, AFP pending She had negative mammogram 2023.  Years since last pap smear.  She's never had colon cancer screening.  Will ask IR if any lesion amenable to biopsy.  Elevated LFTs  Hyperbilirubinemia Follow RUQ [place] -> cholelithiasis with mild gallbladder wall thickening  Will get MRCP, consider discussion with GI pending results Improving today, will follow    Hypodense Posterior R thyroid Nodule Follow thyroid US outpatient  Seborrheic Keratosis Large SK on back, appears most c/w SK to me on exam Follow outpatient    Hyponatremia Mild, will follow     AKI mild   IDDM2 A1c 5.8 11/2022 SSI    HLD, hold statin for now due to elevated LFT   Body mass index is 42.51 kg/m.  Meet class III obesity criteria    DVT prophylaxis: lovenox Code Status: full Family Communication: none Disposition:   Status is: Inpatient Remains inpatient appropriate because: need for IV abx, further w/u   Consultants:  surgery  Procedures:  none  Antimicrobials:  Anti-infectives (From admission, onward)    Start     Dose/Rate Route Frequency Ordered Stop   12/22/22 1300  levofloxacin (LEVAQUIN) IVPB 750 mg  Status:  Discontinued        750 mg 100 mL/hr over 90 Minutes Intravenous Every 48 hours 12/20/22 1938 12/21/22 0729   12/21/22 1400  levofloxacin (LEVAQUIN) IVPB 750 mg  Status:  Discontinued        750 mg 100 mL/hr over 90 Minutes Intravenous Every 24 hours 12/20/22 1833 12/20/22 1938   12/21/22 1400  aztreonam (AZACTAM) 2 g in sodium chloride 0.9 % 100 mL IVPB        2 g 200 mL/hr over 30 Minutes Intravenous Every 8 hours 12/21/22 0729     12/21/22 0200  metroNIDAZOLE (FLAGYL) IVPB 500 mg        500 mg 100 mL/hr over 60 Minutes Intravenous Every 12 hours 12/20/22 1833     12/20/22 1230  levofloxacin (LEVAQUIN) IVPB 750 mg        750 mg 100 mL/hr over 90 Minutes Intravenous  Once 12/20/22 1224 12/20/22 1515  12/20/22 1230  metroNIDAZOLE (FLAGYL) IVPB 500 mg        500 mg 100 mL/hr over 60 Minutes Intravenous  Once 12/20/22 1224 12/20/22 1500       Subjective: C/o abdominal pain and diarrhea after eating yesterday  Objective: Vitals:   12/21/22 1143 12/21/22 2152 12/22/22 0143 12/22/22 0551  BP: (!) 148/68 (!) 152/64  138/69  Pulse: 72 80  80  Resp: 18 17  16   Temp: 98.5 F (36.9 C) 99.2 F (37.3 C) 100 F (37.8 C) 98.7 F (37.1 C)  TempSrc: Oral Oral  Oral  SpO2: 100% 100%  99%  Weight:      Height:        Intake/Output Summary (Last 24 hours) at 12/22/2022 1125 Last data filed at 12/22/2022 0919 Gross per 24 hour   Intake 720 ml  Output --  Net 720 ml   Filed Weights   12/20/22 1210  Weight: 108.9 kg    Examination:  General: No acute distress. Cardiovascular: RRR Lungs: unlabored Abdomen: Soft, nontender, nondistended  Neurological: Alert and oriented 3. Moves all extremities 4 with equal strength. Cranial nerves II through XII grossly intact. Extremities: No clubbing or cyanosis. No edema.   Data Reviewed: I have personally reviewed following labs and imaging studies  CBC: Recent Labs  Lab 12/20/22 1308 12/21/22 0344  WBC 14.6* 9.5  NEUTROABS 13.8* 8.3*  HGB 15.8* 13.0  HCT 47.9* 40.5  MCV 88.5 90.0  PLT 215 165    Basic Metabolic Panel: Recent Labs  Lab 12/20/22 1308 12/21/22 0344  NA 131* 134*  K 4.4 3.8  CL 94* 102  CO2 24 25  GLUCOSE 201* 157*  BUN 23 26*  CREATININE 1.23* 1.02*  CALCIUM 9.0 7.8*    GFR: Estimated Creatinine Clearance: 58.2 mL/min (Daisa Stennis) (by C-G formula based on SCr of 1.02 mg/dL (H)).  Liver Function Tests: Recent Labs  Lab 12/20/22 1308 12/21/22 0344  AST 65* 45*  ALT 86* 58*  ALKPHOS 113 78  BILITOT 3.7* 2.2*  PROT 7.9 6.2*  ALBUMIN 3.7 2.7*    CBG: Recent Labs  Lab 12/20/22 2120 12/21/22 0731 12/21/22 1139 12/21/22 2130 12/22/22 0738  GLUCAP 187* 120* 82 148* 97     Recent Results (from the past 240 hour(s))  Resp panel by RT-PCR (RSV, Flu Caitlan Chauca&B, Covid) Anterior Nasal Swab     Status: None   Collection Time: 12/20/22 12:24 PM   Specimen: Anterior Nasal Swab  Result Value Ref Range Status   SARS Coronavirus 2 by RT PCR NEGATIVE NEGATIVE Final    Comment: (NOTE) SARS-CoV-2 target nucleic acids are NOT DETECTED.  The SARS-CoV-2 RNA is generally detectable in upper respiratory specimens during the acute phase of infection. The lowest concentration of SARS-CoV-2 viral copies this assay can detect is 138 copies/mL. Cayle Cordoba negative result does not preclude SARS-Cov-2 infection and should not be used as the sole basis for  treatment or other patient management decisions. Ranie Chinchilla negative result may occur with  improper specimen collection/handling, submission of specimen other than nasopharyngeal swab, presence of viral mutation(s) within the areas targeted by this assay, and inadequate number of viral copies(<138 copies/mL). Kamilia Carollo negative result must be combined with clinical observations, patient history, and epidemiological information. The expected result is Negative.  Fact Sheet for Patients:  BloggerCourse.com  Fact Sheet for Healthcare Providers:  SeriousBroker.it  This test is no t yet approved or cleared by the [place] FDA and  has been authorized for detection  and/or diagnosis of SARS-CoV-2 by FDA under an Emergency Use Authorization (EUA). This EUA will remain  in effect (meaning this test can be used) for the duration of the COVID-19 declaration under Section 564(b)(1) of the Act, 21 U.S.C.section 360bbb-3(b)(1), unless the authorization is terminated  or revoked sooner.       Influenza Marrisa Kimber by PCR NEGATIVE NEGATIVE Final   Influenza B by PCR NEGATIVE NEGATIVE Final    Comment: (NOTE) The Xpert Xpress SARS-CoV-2/FLU/RSV plus assay is intended as an aid in the diagnosis of influenza from Nasopharyngeal swab specimens and should not be used as Zayquan Bogard sole basis for treatment. Nasal washings and aspirates are unacceptable for Xpert Xpress SARS-CoV-2/FLU/RSV testing.  Fact Sheet for Patients: BloggerCourse.com  Fact Sheet for Healthcare Providers: SeriousBroker.it  This test is not yet approved or cleared by the [place] FDA and has been authorized for detection and/or diagnosis of SARS-CoV-2 by FDA under an Emergency Use Authorization (EUA). This EUA will remain in effect (meaning this test can be used) for the duration of the COVID-19 declaration under Section 564(b)(1) of the Act, 21  U.S.C. section 360bbb-3(b)(1), unless the authorization is terminated or revoked.     Resp Syncytial Virus by PCR NEGATIVE NEGATIVE Final    Comment: (NOTE) Fact Sheet for Patients: BloggerCourse.com  Fact Sheet for Healthcare Providers: SeriousBroker.it  This test is not yet approved or cleared by the [place] FDA and has been authorized for detection and/or diagnosis of SARS-CoV-2 by FDA under an Emergency Use Authorization (EUA). This EUA will remain in effect (meaning this test can be used) for the duration of the COVID-19 declaration under Section 564(b)(1) of the Act, 21 U.S.C. section 360bbb-3(b)(1), unless the authorization is terminated or revoked.  Performed at [facility], [address] [place], [place] [address]   Blood Culture (routine x 2)     Status: Abnormal (Preliminary result)   Collection Time: 12/20/22  1:08 PM   Specimen: BLOOD  Result Value Ref Range Status   Specimen Description   Final    BLOOD LEFT ANTECUBITAL Performed at [facility], [address] [place], [place] [address]    Special Requests   Final    BOTTLES DRAWN AEROBIC AND ANAEROBIC Blood Culture adequate volume Performed at [facility], [address], [place], [place] [address]    Culture  Setup Time   Final    GRAM NEGATIVE RODS IN BOTH AEROBIC AND ANAEROBIC BOTTLES CRITICAL RESULT CALLED TO, READ BACK BY AND VERIFIED WITH: PHARMD E. JACKSON 12/21/22 @ 0645 BY AB    Culture (Algenis Ballin)  Final    KLEBSIELLA PNEUMONIAE SUSCEPTIBILITIES TO FOLLOW Performed at [facility], [address] [place], [place] [address]    Report Status PENDING  Incomplete  Blood Culture ID Panel (Reflexed)     Status: Abnormal   Collection Time: 12/20/22  1:08 PM  Result Value Ref Range Status   Enterococcus faecalis NOT DETECTED NOT DETECTED Final   Enterococcus Faecium NOT DETECTED NOT  DETECTED Final   Listeria monocytogenes NOT DETECTED NOT DETECTED Final   Staphylococcus species NOT DETECTED NOT DETECTED Final   Staphylococcus aureus (BCID) NOT DETECTED NOT DETECTED Final   Staphylococcus epidermidis NOT DETECTED NOT DETECTED Final   Staphylococcus lugdunensis NOT DETECTED NOT DETECTED Final   Streptococcus species NOT DETECTED NOT DETECTED Final   Streptococcus agalactiae NOT DETECTED NOT DETECTED Final   Streptococcus pneumoniae NOT DETECTED NOT DETECTED Final  Streptococcus pyogenes NOT DETECTED NOT DETECTED Final   Marvelle Caudill.calcoaceticus-baumannii NOT DETECTED NOT DETECTED Final   Bacteroides fragilis NOT DETECTED NOT DETECTED Final   Enterobacterales DETECTED (Mauricio Dahlen) NOT DETECTED Final    Comment: Enterobacterales represent Sharron Simpson large order of gram negative bacteria, not Ashia Dehner single organism. CRITICAL RESULT CALLED TO, READ BACK BY AND VERIFIED WITH: PHARMD E. JACKSON 12/21/22 @ 0645 BY AB    Enterobacter cloacae complex NOT DETECTED NOT DETECTED Final   Escherichia coli NOT DETECTED NOT DETECTED Final   Klebsiella aerogenes NOT DETECTED NOT DETECTED Final   Klebsiella oxytoca NOT DETECTED NOT DETECTED Final   Klebsiella pneumoniae DETECTED (Autumnrose Yore) NOT DETECTED Final    Comment: CRITICAL RESULT CALLED TO, READ BACK BY AND VERIFIED WITH: PHARMD E. JACKSON 12/21/22 @ 0645 BY AB    Proteus species NOT DETECTED NOT DETECTED Final   Salmonella species NOT DETECTED NOT DETECTED Final   Serratia marcescens NOT DETECTED NOT DETECTED Final   Haemophilus influenzae NOT DETECTED NOT DETECTED Final   Neisseria meningitidis NOT DETECTED NOT DETECTED Final   Pseudomonas aeruginosa NOT DETECTED NOT DETECTED Final   Stenotrophomonas maltophilia NOT DETECTED NOT DETECTED Final   Candida albicans NOT DETECTED NOT DETECTED Final   Candida auris NOT DETECTED NOT DETECTED Final   Candida glabrata NOT DETECTED NOT DETECTED Final   Candida krusei NOT DETECTED NOT DETECTED Final   Candida  parapsilosis NOT DETECTED NOT DETECTED Final   Candida tropicalis NOT DETECTED NOT DETECTED Final   Cryptococcus neoformans/gattii NOT DETECTED NOT DETECTED Final   CTX-M ESBL NOT DETECTED NOT DETECTED Final   Carbapenem resistance IMP NOT DETECTED NOT DETECTED Final   Carbapenem resistance KPC NOT DETECTED NOT DETECTED Final   Carbapenem resistance NDM NOT DETECTED NOT DETECTED Final   Carbapenem resist OXA 48 LIKE NOT DETECTED NOT DETECTED Final   Carbapenem resistance VIM NOT DETECTED NOT DETECTED Final    Comment: Performed at [facility], [address] [place], [place] [address]  Blood Culture (routine x 2)     Status: None (Preliminary result)   Collection Time: 12/20/22  7:33 PM   Specimen: BLOOD RIGHT ARM  Result Value Ref Range Status   Specimen Description BLOOD RIGHT ARM  Final   Special Requests   Final    BOTTLES DRAWN AEROBIC AND ANAEROBIC Blood Culture adequate volume   Culture   Final    NO GROWTH 2 DAYS Performed at [facility], [address], [place], [place] [address]    Report Status PENDING  Incomplete  C Difficile Quick Screen w PCR reflex     Status: None   Collection Time: 12/21/22  5:51 PM   Specimen: Stool  Result Value Ref Range Status   C Diff antigen NEGATIVE NEGATIVE Final   C Diff toxin NEGATIVE NEGATIVE Final   C Diff interpretation No C. difficile detected.  Final    Comment: Performed at [facility], [address] [place], [place] [address]  Gastrointestinal Panel by PCR , Stool     Status: None   Collection Time: 12/21/22  5:51 PM   Specimen: Stool  Result Value Ref Range Status   Campylobacter species NOT DETECTED NOT DETECTED Final   Plesimonas shigelloides NOT DETECTED NOT DETECTED Final   Salmonella species NOT DETECTED NOT DETECTED Final   Yersinia enterocolitica NOT DETECTED NOT DETECTED Final   Vibrio species NOT DETECTED NOT DETECTED Final   Vibrio cholerae NOT DETECTED NOT DETECTED Final  Enteroaggregative E coli (EAEC) NOT DETECTED NOT DETECTED Final   Enteropathogenic E coli (EPEC) NOT DETECTED NOT DETECTED Final   Enterotoxigenic E coli (ETEC) NOT DETECTED NOT DETECTED Final   Shiga like toxin producing E coli (STEC) NOT DETECTED NOT DETECTED Final   Shigella/Enteroinvasive E coli (EIEC) NOT DETECTED NOT DETECTED Final   Cryptosporidium NOT DETECTED NOT DETECTED Final   Cyclospora cayetanensis NOT DETECTED NOT DETECTED Final   Entamoeba histolytica NOT DETECTED NOT DETECTED Final   Giardia lamblia NOT DETECTED NOT DETECTED Final   Adenovirus F40/41 NOT DETECTED NOT DETECTED Final   Astrovirus NOT DETECTED NOT DETECTED Final   Norovirus GI/GII NOT DETECTED NOT DETECTED Final   Rotavirus Celsa Nordahl NOT DETECTED NOT DETECTED Final   Sapovirus (I, II, IV, and V) NOT DETECTED NOT DETECTED Final    Comment: Performed at [facility], [address], [place], [place] [address]         Radiology Studies: CT CHEST W CONTRAST  Result Date: 12/22/2022 CLINICAL DATA:  Inpatient. Abnormal CT abdomen/pelvis study with liver lesions and retroperitoneal adenopathy suspicious for metastatic disease of uncertain primary. * Tracking Code: BO * EXAM: CT CHEST WITH CONTRAST TECHNIQUE: Multidetector CT imaging of the chest was performed during intravenous contrast administration. RADIATION DOSE REDUCTION: This exam was performed according to the departmental dose-optimization program which includes automated exposure control, adjustment of the mA and/or kV according to patient size and/or use of iterative reconstruction technique. CONTRAST:  75mL OMNIPAQUE IOHEXOL 300 MG/ML  SOLN COMPARISON:  12/20/2022 CT abdomen/pelvis and chest radiograph. FINDINGS: Cardiovascular: Normal heart size. No significant pericardial effusion/thickening. Three-vessel coronary atherosclerosis. Atherosclerotic nonaneurysmal thoracic aorta. Normal caliber pulmonary arteries. No central pulmonary emboli.  Mediastinum/Nodes: Hypodense posterior right thyroid 1.5 cm nodule. Unremarkable esophagus. Mild left supraclavicular adenopathy up to 1.0 cm (series 2/image 15). No axillary adenopathy. Mildly enlarged low posterior mediastinal para-lymph nodes up to 1.2 cm on the right (series 2/image 104). No hilar adenopathy. Lungs/Pleura: No pneumothorax. No pleural effusion. No acute consolidative airspace disease or lung masses. At least five solid pulmonary nodules scattered in the lungs bilaterally, largest 0.5 cm in the posteromedial basilar subpleural right lower lobe (series 6/image 107), 0.4 cm in the medial left lower lobe (series 6/image 100) and 0.4 cm in the anterior right middle lobe (series 6/image 70). Upper abdomen: Multiple persistent hypodense superior right liver lesions, poorly defined on this chest CT study. Subcentimeter hypodense posterior upper left renal cortical lesion, too small to characterize, for which no follow-up imaging is recommended. Partially visualized porta hepatis, portacaval and aortocaval adenopathy in the upper abdomen, better seen on CT abdomen study from 1 day prior. Musculoskeletal: No aggressive appearing focal osseous lesions. Marked thoracic degenerative disc disease. IMPRESSION: 1. At least five solid pulmonary nodules scattered in the lungs bilaterally, largest 0.5 cm, equivocal for pulmonary metastases. Recommend attention on follow-up chest CT in 3 months. 2. Mild left supraclavicular and low posterior mediastinal lymphadenopathy, suspicious for nodal metastases. 3. Multiple persistent hypodense superior right liver lesions and partially visualized porta hepatis, portacaval and aortocaval adenopathy in the upper abdomen, all better seen on CT abdomen study from 1 day prior. 4. Hypodense posterior right thyroid 1.5 cm nodule. Recommend non-emergent thyroid ultrasound. Reference: J Am Coll Radiol. 2015 Feb;12(2): 143-50 5. Three-vessel coronary atherosclerosis. 6.  Aortic  Atherosclerosis (ICD10-I70.0). Electronically Signed   By: Delbert Phenix M.D.   On: 12/22/2022 08:00   US Abdomen Limited RUQ (LIVER/GB)  Result Date: 12/21/2022 CLINICAL DATA:  Abdominal pain EXAM: ULTRASOUND ABDOMEN LIMITED RIGHT UPPER QUADRANT COMPARISON:  CT from the previous day FINDINGS: Gallbladder: Gallbladder incompletely distended. Mild gallbladder wall thickening up to 6 mm. To nerve root reports no sonographic Murphy sign however. Several gallstones measuring up to 8 mm in the lumen. Common bile duct: Diameter: 4 mm.  No intrahepatic biliary ductal dilatation. Liver: Scattered echogenic lesions corresponding to CT findings. Portal vein is patent on color Doppler imaging with normal direction of blood flow towards the liver. Other: Technologist describes technically difficult study secondary to patient breathing, overlying bowel, body habitus. IMPRESSION: 1. Cholelithiasis with mild gallbladder wall thickening. No sonographic Murphy sign. 2. No biliary dilatation. 3. Scattered echogenic liver lesions corresponding to CT findings, presumed metastatic disease. Electronically Signed   By: Corlis Leak M.D.   On: 12/21/2022 11:16   CT ABDOMEN PELVIS W CONTRAST  Result Date: 12/20/2022 CLINICAL DATA:  72 year old female with acute abdominal and pelvic pain. EXAM: CT ABDOMEN AND PELVIS WITH CONTRAST TECHNIQUE: Multidetector CT imaging of the abdomen and pelvis was performed using the standard protocol following bolus administration of intravenous contrast. RADIATION DOSE REDUCTION: This exam was performed according to the departmental dose-optimization program which includes automated exposure control, adjustment of the mA and/or kV according to patient size and/or use of iterative reconstruction technique. CONTRAST:  OMNIPAQUE IOHEXOL 300 MG/ML  SOLN COMPARISON:  05/17/2019 CT and prior studies FINDINGS: Lower chest: No acute abnormality. Hepatobiliary: There are multiple low-density lesions within  both the RIGHT and LEFT liver, with index 1.5 x 2 cm lesion and 1.5 x 2.5 cm lesion at the dome (image 10: Series 2). Gallbladder wall thickening and cholelithiasis noted. No definite biliary dilatation identified. Pancreas: Unremarkable Spleen: Unremarkable Adrenals/Urinary Tract: Punctate nonobstructing bilateral renal calculi are again identified without other significant change. There is no evidence of suspicious renal mass hydronephrosis. The adrenal glands and bladder are unremarkable. Stomach/Bowel: No definite bowel wall thickening noted. There is no evidence of bowel obstruction or inflammatory changes. Vascular/Lymphatic: Periportal, mesenteric, periaortic and retroperitoneal adenopathy identified with the index 1.6 x 3.9 cm portacaval node (22:2) identified. Aortic atherosclerotic calcifications noted without aneurysm. Reproductive: Uterus and adnexal regions are unchanged with ureteral calcifications/fibroids. Other: No ascites, focal collection or pneumoperitoneum. Musculoskeletal: No acute or suspicious bony abnormalities are noted. Moderate degenerative disc disease/spondylosis at T12-L1 and L4-5 again noted. IMPRESSION: 1. Multiple low-density lesions throughout the liver and abdominal/retroperitoneal lymphadenopathy, highly suspicious for malignancy/metastatic disease. Primary malignancy is not identified on this examination. 2. Gallbladder wall thickening and cholelithiasis without definite biliary dilatation. If there is strong clinical suspicion for acute cholecystitis, recommend ultrasound or nuclear medicine study. 3. Punctate nonobstructing bilateral renal calculi. 4.  Aortic Atherosclerosis (ICD10-I70.0). Electronically Signed   By: Harmon Pier M.D.   On: 12/20/2022 15:38   DG Chest Port 1 View  Result Date: 12/20/2022 CLINICAL DATA:  Nausea and vomiting.  Melena.  Sepsis. EXAM: PORTABLE CHEST 1 VIEW COMPARISON:  10/10/2020 FINDINGS: Patient is rotated to the left. Heart size is within  normal limits. Aortic atherosclerotic calcification incidentally noted. Both lungs are clear. IMPRESSION: No active disease. Electronically Signed   By: Danae Orleans M.D.   On: 12/20/2022 13:55        Scheduled Meds:  cholecalciferol  400 Units Oral Daily   enoxaparin (LOVENOX) injection  40 mg Subcutaneous Q24H   insulin aspart  0-15 Units Subcutaneous TID WC   Continuous Infusions:  aztreonam 2 g (12/22/22 0601)   metronidazole 500 mg (12/22/22  1610)     LOS: 2 days    Time spent: over 30 min    Lacretia Nicks, MD [facility]   To contact the attending provider between 7A-7P or the covering provider during after hours 7P-7A, please log into the web site [email or web address] and access using universal [place] password for that web site. If you do not have the password, please call the hospital operator.  12/22/2022, 11:25 AM

## 2022-12-22 NOTE — Progress Notes (Signed)
Mobility Specialist - Progress Note   12/22/22 1059  Mobility  Activity Ambulated with assistance in hallway  Level of Assistance Independent after set-up  Assistive Device Other (Comment) (IV Pole)  Distance Ambulated (ft) 350 ft  Activity Response Tolerated well  Mobility Referral Yes  $Mobility charge 1 Mobility  Mobility Specialist Start Time (ACUTE ONLY) 1050  Mobility Specialist Stop Time (ACUTE ONLY) 1059  Mobility Specialist Time Calculation (min) (ACUTE ONLY) 9 min   Pt received in bed and agreeable to mobility. No complaints during session. Pt to bed after session with all needs met.    [facility]

## 2022-12-23 ENCOUNTER — Inpatient Hospital Stay (HOSPITAL_COMMUNITY): Payer: Medicare Other

## 2022-12-23 DIAGNOSIS — A419 Sepsis, unspecified organism: Secondary | ICD-10-CM | POA: Diagnosis not present

## 2022-12-23 LAB — COMPREHENSIVE METABOLIC PANEL
ALT: 51 U/L — ABNORMAL HIGH (ref 0–44)
AST: 57 U/L — ABNORMAL HIGH (ref 15–41)
Albumin: 2.6 g/dL — ABNORMAL LOW (ref 3.5–5.0)
Alkaline Phosphatase: 104 U/L (ref 38–126)
Anion gap: 6 (ref 5–15)
BUN: 16 mg/dL (ref 8–23)
CO2: 26 mmol/L (ref 22–32)
Calcium: 7.9 mg/dL — ABNORMAL LOW (ref 8.9–10.3)
Chloride: 105 mmol/L (ref 98–111)
Creatinine, Ser: 0.78 mg/dL (ref 0.44–1.00)
GFR, Estimated: >60 mL/min (ref >60)
Glucose, Bld: 106 mg/dL — ABNORMAL HIGH (ref 70–99)
Potassium: 3.9 mmol/L (ref 3.5–5.1)
Sodium: 137 mmol/L (ref 135–145)
Total Bilirubin: 2.2 mg/dL — ABNORMAL HIGH (ref 0.3–1.2)
Total Protein: 6.2 g/dL — ABNORMAL LOW (ref 6.5–8.1)

## 2022-12-23 LAB — CULTURE, BLOOD (ROUTINE X 2): Special Requests: ADEQUATE

## 2022-12-23 LAB — CBC WITH DIFFERENTIAL/PLATELET
Abs Immature Granulocytes: 0.02 10*3/uL (ref 0.00–0.07)
Basophils Absolute: 0 10*3/uL (ref 0.0–0.1)
Basophils Relative: 0 %
Eosinophils Absolute: 0.1 10*3/uL (ref 0.0–0.5)
Eosinophils Relative: 1 %
HCT: 39.5 % (ref 36.0–46.0)
Hemoglobin: 13.1 g/dL (ref 12.0–15.0)
Immature Granulocytes: 0 %
Lymphocytes Relative: 11 %
Lymphs Abs: 0.6 10*3/uL — ABNORMAL LOW (ref 0.7–4.0)
MCH: 29.5 pg (ref 26.0–34.0)
MCHC: 33.2 g/dL (ref 30.0–36.0)
MCV: 89 fL (ref 80.0–100.0)
Monocytes Absolute: 1.2 10*3/uL — ABNORMAL HIGH (ref 0.1–1.0)
Monocytes Relative: 22 %
Neutro Abs: 3.4 10*3/uL (ref 1.7–7.7)
Neutrophils Relative %: 66 %
Platelets: 198 10*3/uL (ref 150–400)
RBC: 4.44 MIL/uL (ref 3.87–5.11)
RDW: 12.7 % (ref 11.5–15.5)
WBC: 5.3 10*3/uL (ref 4.0–10.5)
nRBC: 0 % (ref 0.0–0.2)

## 2022-12-23 LAB — GLUCOSE, CAPILLARY
Glucose-Capillary: 94 mg/dL (ref 70–99)
Glucose-Capillary: 116 mg/dL — ABNORMAL HIGH (ref 70–99)
Glucose-Capillary: 80 mg/dL (ref 70–99)
Glucose-Capillary: 150 mg/dL — ABNORMAL HIGH (ref 70–99)

## 2022-12-23 LAB — BRAIN NATRIURETIC PEPTIDE: B Natriuretic Peptide: 46.9 pg/mL (ref 0.0–100.0)

## 2022-12-23 LAB — AFP TUMOR MARKER: AFP, Serum, Tumor Marker: 10.2 ng/mL — ABNORMAL HIGH (ref 0.0–9.2)

## 2022-12-23 LAB — CEA: CEA: 18.9 ng/mL — ABNORMAL HIGH (ref 0.0–4.7)

## 2022-12-23 LAB — PHOSPHORUS: Phosphorus: 2.7 mg/dL (ref 2.5–4.6)

## 2022-12-23 LAB — MAGNESIUM: Magnesium: 1.8 mg/dL (ref 1.7–2.4)

## 2022-12-23 MED ORDER — FENTANYL CITRATE (PF) 100 MCG/2ML IJ SOLN
INTRAMUSCULAR | Status: AC | PRN
  Administered 2022-12-23 (×2): 50 ug via INTRAVENOUS

## 2022-12-23 MED ORDER — GELATIN ABSORBABLE 12-7 MM EX MISC
CUTANEOUS | Status: AC
  Filled 2022-12-23: qty 1

## 2022-12-23 MED ORDER — MIDAZOLAM HCL 2 MG/2ML IJ SOLN
INTRAMUSCULAR | Status: AC | PRN
  Administered 2022-12-23: 1 mg via INTRAVENOUS
  Administered 2022-12-23: .5 mg via INTRAVENOUS

## 2022-12-23 MED ORDER — LIDOCAINE HCL 1 % IJ SOLN
INTRAMUSCULAR | Status: AC
  Filled 2022-12-23: qty 20

## 2022-12-23 MED ORDER — SODIUM CHLORIDE 0.9 % IV SOLN
2.0000 g | INTRAVENOUS | Status: DC
  Administered 2022-12-23 – 2022-12-24 (×2): 2 g via INTRAVENOUS
  Filled 2022-12-23 (×2): qty 20

## 2022-12-23 MED ORDER — MIDAZOLAM HCL 2 MG/2ML IJ SOLN
INTRAMUSCULAR | Status: AC
  Filled 2022-12-23: qty 2

## 2022-12-23 MED ORDER — FENTANYL CITRATE (PF) 100 MCG/2ML IJ SOLN
INTRAMUSCULAR | Status: AC
  Filled 2022-12-23: qty 2

## 2022-12-23 NOTE — Progress Notes (Incomplete)
Pharmacy Note- Penicillin Allergy Clarification   ASSESSMENT:   PEN-FAST Scoring   Five years or less since last reaction 0  Anaphylaxis/Angioedema OR Severe cutaneous adverse reaction  0  Treatment required for reaction  0  Total Score 0 points - Very low risk of positive penicillin allergy test (<1%)      The patient describes the reaction that occurred in her 20's as rapid breathing after taking Amoxicillin that was not associated with a rash or throat swelling. She did not require any medical intervention for treatment, just was switched to an alternative antibiotic.  Type of intervention:  Amoxicillin Oral Challenge  Impact on therapy (select all that apply):  {Impact:[id number]}   PLAN: - amoxicillin 500 mg PO x1  - Q22min vitals monitoring x 1 hour  - Epi-Pen PRN  - IV diphenhydramine PRN  - plan communicated to primary RN   POST-CHALLENGE FOLLOW-UP:

## 2022-12-23 NOTE — Progress Notes (Addendum)
PROGRESS NOTE    Regina Mckay  WUJ:[id number] DOB: [birth year] DOA: 12/20/2022 PCP: [name], NP  Chief Complaint  Patient presents with   Emesis   Nausea   Melena    Brief Narrative:   Regina Mckay is Regina Mckay 72 y.o. female with h/o obesity, HTN, IDDM2 also on Ozempic report started have nausea vomiting diarrhea since Thursday evening, denies Abdo pain, report has been having upset stomach taking Pepto-Bismol every2- 3 days, reports fever at home, denies sick contact   She's been admitted with sepsis, now found to have bacteremia.  Findings concerning for metastatic disease, s/p liver biopsy.  She could discharge as early as 5/22.  Has amoxicillin oral challenge to complete (per pharmacy).  After this, if doing well and labs improving, could consider discharge with plan for outpatient follow up of pending biopsy results and oral antibiotics.   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  MRCP without choledocholithiasis.  She has some gallbladder wall thickening and mural edema, but NO RUQ abdominal pain.  Surgery was previously consulted and recommended against HIDA scan given low clinical suspicion for cholecystitis with absence of pain on exam.  UA with negative nitrite, negative leukocytes -> 0-5 RBC, 0-5 WBC, no bacteria -> not suggestive of UTI Blood cultures with klebsiella, resistant to ampicillin.   Now on ceftriaxone.  Will need 7-10 days abx.   Appreciate ID assistance with concern for pcn allergy.  Plan for  amoxicillin oral challenge 5/22.    Metastatic malignancy? CT abd/pelvis with multiple low density lesions throughout the liver and abdominal/retroperitoneal lymphadenopathy, concerning for metastatic disease/malignancy.   CT chest with at least 5 solid pulmonary nodules, equivocal for pulm mets, L supraclavicular and low posterior mediastinal LAD, concerning for nodal mets CEA elevated, AFP mildly elevated She had negative mammogram 2023.  Years since last pap smear.  She's never had colon cancer screening.  S/p US guided bx of liver mass -> will need to follow bx results, referral to oncology placed  Elevated LFTs  Hyperbilirubinemia Follow RUQ [place] -> cholelithiasis with mild gallbladder wall thickening  MRCP with 3 distinct lesions noted in the liver parenchyma.  Additional tiny hypoenhancing lesions seen in the liver parenchyma on previous CT less well demonstrated by MRI.  Imaging features concerning for metastatic disease.  Bulkly lymphadenopathy in the hepatodudenal ligament.  Retroperitoneal LAD.  Right portal vein appears markedly attenduated and patency cannot be confirmed.  Main portal vein is patent.  Cholelithiasis with apparent gallbladder wall thickening and mural edema.  MRCP images motion degraded, but no extrahepatic biliary duct dilatation.  No evidence for choledocholithiasis Trend, consider discussion with GI if not improving  Trace Edema Follow BNP Urine without protein ? Related to hypoalbuminemia    Hypodense Posterior R thyroid Nodule Follow thyroid US outpatient  Seborrheic Keratosis Large SK on back, appears most c/w SK to me on exam Follow outpatient    Hyponatremia Mild, will follow    AKI mild   IDDM2 A1c 5.8 11/2022 SSI  HLD, hold statin for now due to elevated LFT   Body mass index is 42.51 kg/m.  Meet class III obesity criteria    DVT prophylaxis: lovenox Code Status: full Family Communication: none Disposition:   Status is:  Inpatient Remains inpatient appropriate because: need for IV abx, further w/u   Consultants:  surgery  Procedures:  none  Antimicrobials:  Anti-infectives (From admission, onward)    Start     Dose/Rate Route Frequency Ordered Stop   12/23/22 1400  cefTRIAXone (ROCEPHIN) 2 g in sodium chloride 0.9 % 100 mL IVPB        2 g 200 mL/hr over 30 Minutes Intravenous Every 24 hours 12/23/22 1140     12/22/22 1300  levofloxacin (LEVAQUIN) IVPB 750 mg  Status:  Discontinued        750 mg 100 mL/hr over 90 Minutes Intravenous Every 48 hours 12/20/22 1938 12/21/22 0729   12/21/22 1400  levofloxacin (LEVAQUIN) IVPB 750 mg  Status:  Discontinued        750 mg 100 mL/hr over 90 Minutes Intravenous Every 24 hours 12/20/22 1833 12/20/22 1938   12/21/22 1400  aztreonam (AZACTAM) 2 g in sodium chloride 0.9 % 100 mL IVPB  Status:  Discontinued        2 g 200 mL/hr over 30 Minutes Intravenous Every 8 hours 12/21/22 0729 12/23/22 1140   12/21/22 0200  metroNIDAZOLE (FLAGYL) IVPB 500 mg        500 mg 100 mL/hr over 60 Minutes Intravenous Every 12 hours 12/20/22 1833     12/20/22 1230  levofloxacin (LEVAQUIN) IVPB 750 mg        750 mg 100 mL/hr over 90 Minutes Intravenous  Once 12/20/22 1224 12/20/22 1515   12/20/22 1230  metroNIDAZOLE (FLAGYL) IVPB 500 mg        500 mg 100 mL/hr over 60 Minutes Intravenous  Once 12/20/22 1224 12/20/22 1500       Subjective: Feeels ok, no new complaints  Objective: Vitals:   12/23/22 1620 12/23/22 1642 12/23/22 1724 12/23/22 1744  BP: (!) 151/63 (!) 153/72 (!) 151/66 (!) 158/71  Pulse: 68 75 83 82  Resp: 16 16 16 16   Temp: 98.3 F (36.8 C) 98 F (36.7 C) 98.3 F (36.8 C) 98.2 F (36.8 C)  TempSrc: Oral Oral Oral Oral  SpO2: 99% 99% 100% 100%  Weight:      Height:        Intake/Output Summary (Last 24 hours) at 12/23/2022 1754 Last data filed at 12/23/2022 0900 Gross per 24 hour  Intake 600 ml  Output 0 ml  Net 600 ml   Filed Weights   12/20/22  1210  Weight: 108.9 kg    Examination:  General: No acute distress. Cardiovascular: RRR Lungs: unlabored Abdomen: Soft, nontender, nondistended - negative murphy's sign  Neurological: Alert and oriented 3. Moves all extremities 4. Cranial nerves II through XII grossly intact. Skin: Warm and dry. No rashes or lesions. Extremities: trace edema  Data Reviewed: I have personally reviewed following labs and imaging studies  CBC: Recent Labs  Lab 12/20/22 1308 12/21/22 0344 12/23/22 0428  WBC 14.6* 9.5 5.3  NEUTROABS 13.8* 8.3* 3.4  HGB 15.8* 13.0 13.1  HCT 47.9* 40.5 39.5  MCV 88.5 90.0 89.0  PLT 215 165 198    Basic Metabolic Panel: Recent Labs  Lab 12/20/22 1308 12/21/22 0344 12/23/22 0428  NA 131* 134* 137  K 4.4 3.8 3.9  CL 94* 102 105  CO2  24 25 26   GLUCOSE 201* 157* 106*  BUN 23 26* 16  CREATININE 1.23* 1.02* 0.78  CALCIUM 9.0 7.8* 7.9*  MG  --   --  1.8  PHOS  --   --  2.7    GFR: Estimated Creatinine Clearance: 74.2 mL/min (by C-G formula based on SCr of 0.78 mg/dL).  Liver Function Tests: Recent Labs  Lab 12/20/22 1308 12/21/22 0344 12/23/22 0428  AST 65* 45* 57*  ALT 86* 58* 51*  ALKPHOS 113 78 104  BILITOT 3.7* 2.2* 2.2*  PROT 7.9 6.2* 6.2*  ALBUMIN 3.7 2.7* 2.6*    CBG: Recent Labs  Lab 12/22/22 1644 12/22/22 2240 12/23/22 0801 12/23/22 1219 12/23/22 1646  GLUCAP 103* 100* 94 116* 80     Recent Results (from the past 240 hour(s))  Resp panel by RT-PCR (RSV, Flu Torrie Namba&B, Covid) Anterior Nasal Swab     Status: None   Collection Time: 12/20/22 12:24 PM   Specimen: Anterior Nasal Swab  Result Value Ref Range Status   SARS Coronavirus 2 by RT PCR NEGATIVE NEGATIVE Final    Comment: (NOTE) SARS-CoV-2 target nucleic acids are NOT DETECTED.  The SARS-CoV-2 RNA is generally detectable in upper respiratory specimens during the acute phase of infection. The lowest concentration of SARS-CoV-2 viral copies this assay can detect is 138  copies/mL. Siriyah Ambrosius negative result does not preclude SARS-Cov-2 infection and should not be used as the sole basis for treatment or other patient management decisions. Ghadeer Kastelic negative result may occur with  improper specimen collection/handling, submission of specimen other than nasopharyngeal swab, presence of viral mutation(s) within the areas targeted by this assay, and inadequate number of viral copies(<138 copies/mL). Ameet Sandy negative result must be combined with clinical observations, patient history, and epidemiological information. The expected result is Negative.  Fact Sheet for Patients:  BloggerCourse.com  Fact Sheet for Healthcare Providers:  SeriousBroker.it  This test is no t yet approved or cleared by the [place] FDA and  has been authorized for detection and/or diagnosis of SARS-CoV-2 by FDA under an Emergency Use Authorization (EUA). This EUA will remain  in effect (meaning this test can be used) for the duration of the COVID-19 declaration under Section 564(b)(1) of the Act, 21 U.S.C.section 360bbb-3(b)(1), unless the authorization is terminated  or revoked sooner.       Influenza Grayland Daisey by PCR NEGATIVE NEGATIVE Final   Influenza B by PCR NEGATIVE NEGATIVE Final    Comment: (NOTE) The Xpert Xpress SARS-CoV-2/FLU/RSV plus assay is intended as an aid in the diagnosis of influenza from Nasopharyngeal swab specimens and should not be used as Genevie Elman sole basis for treatment. Nasal washings and aspirates are unacceptable for Xpert Xpress SARS-CoV-2/FLU/RSV testing.  Fact Sheet for Patients: BloggerCourse.com  Fact Sheet for Healthcare Providers: SeriousBroker.it  This test is not yet approved or cleared by the [place] FDA and has been authorized for detection and/or diagnosis of SARS-CoV-2 by FDA under an Emergency Use Authorization (EUA). This EUA will remain in effect (meaning  this test can be used) for the duration of the COVID-19 declaration under Section 564(b)(1) of the Act, 21 U.S.C. section 360bbb-3(b)(1), unless the authorization is terminated or revoked.     Resp Syncytial Virus by PCR NEGATIVE NEGATIVE Final    Comment: (NOTE) Fact Sheet for Patients: BloggerCourse.com  Fact Sheet for Healthcare Providers: SeriousBroker.it  This test is not yet approved or cleared by the [place] FDA and has been authorized for detection and/or diagnosis of SARS-CoV-2  by FDA under an Emergency Use Authorization (EUA). This EUA will remain in effect (meaning this test can be used) for the duration of the COVID-19 declaration under Section 564(b)(1) of the Act, 21 U.S.C. section 360bbb-3(b)(1), unless the authorization is terminated or revoked.  Performed at [facility], [address] [place], [place] [address]   Blood Culture (routine x 2)     Status: Abnormal   Collection Time: 12/20/22  1:08 PM   Specimen: BLOOD  Result Value Ref Range Status   Specimen Description   Final    BLOOD LEFT ANTECUBITAL Performed at [facility], [address], [place], [place] [address]    Special Requests   Final    BOTTLES DRAWN AEROBIC AND ANAEROBIC Blood Culture adequate volume Performed at [facility], [address] [place], [place] [address]    Culture  Setup Time   Final    GRAM NEGATIVE RODS IN BOTH AEROBIC AND ANAEROBIC BOTTLES CRITICAL RESULT CALLED TO, READ BACK BY AND VERIFIED WITH: PHARMD E. JACKSON 12/21/22 @ 0645 BY AB Performed at [facility], [address], [place], [place] [address]    Culture KLEBSIELLA PNEUMONIAE (Aquila Menzie)  Final   Report Status 12/23/2022 FINAL  Final   Organism ID, Bacteria KLEBSIELLA PNEUMONIAE  Final      Susceptibility   Klebsiella pneumoniae - MIC*    AMPICILLIN >=32 RESISTANT Resistant     CEFEPIME <=0.12  SENSITIVE Sensitive     CEFTAZIDIME <=1 SENSITIVE Sensitive     CEFTRIAXONE <=0.25 SENSITIVE Sensitive     CIPROFLOXACIN <=0.25 SENSITIVE Sensitive     GENTAMICIN <=1 SENSITIVE Sensitive     IMIPENEM 0.5 SENSITIVE Sensitive     TRIMETH/SULFA <=20 SENSITIVE Sensitive     AMPICILLIN/SULBACTAM 4 SENSITIVE Sensitive     PIP/TAZO <=4 SENSITIVE Sensitive     * KLEBSIELLA PNEUMONIAE  Blood Culture ID Panel (Reflexed)     Status: Abnormal   Collection Time: 12/20/22  1:08 PM  Result Value Ref Range Status   Enterococcus faecalis NOT DETECTED NOT DETECTED Final   Enterococcus Faecium NOT DETECTED NOT DETECTED Final   Listeria monocytogenes NOT DETECTED NOT DETECTED Final   Staphylococcus species NOT DETECTED NOT DETECTED Final   Staphylococcus aureus (BCID) NOT DETECTED NOT DETECTED Final   Staphylococcus epidermidis NOT DETECTED NOT DETECTED Final   Staphylococcus lugdunensis NOT DETECTED NOT DETECTED Final   Streptococcus species NOT DETECTED NOT DETECTED Final   Streptococcus agalactiae NOT DETECTED NOT DETECTED Final   Streptococcus pneumoniae NOT DETECTED NOT DETECTED Final   Streptococcus pyogenes NOT DETECTED NOT DETECTED Final   Serin Thornell.calcoaceticus-baumannii NOT DETECTED NOT DETECTED Final   Bacteroides fragilis NOT DETECTED NOT DETECTED Final   Enterobacterales DETECTED (Ashliegh Parekh) NOT DETECTED Final    Comment: Enterobacterales represent Pressley Barsky large order of gram negative bacteria, not Rula Keniston single organism. CRITICAL RESULT CALLED TO, READ BACK BY AND VERIFIED WITH: PHARMD E. JACKSON 12/21/22 @ 0645 BY AB    Enterobacter cloacae complex NOT DETECTED NOT DETECTED Final   Escherichia coli NOT DETECTED NOT DETECTED Final   Klebsiella aerogenes NOT DETECTED NOT DETECTED Final   Klebsiella oxytoca NOT DETECTED NOT DETECTED Final   Klebsiella pneumoniae DETECTED (Aryaan Persichetti) NOT DETECTED Final    Comment: CRITICAL RESULT CALLED TO, READ BACK BY AND VERIFIED WITH: PHARMD E. JACKSON 12/21/22 @ 0645 BY AB    Proteus  species NOT DETECTED NOT DETECTED Final   Salmonella species NOT DETECTED NOT DETECTED  Final   Serratia marcescens NOT DETECTED NOT DETECTED Final   Haemophilus influenzae NOT DETECTED NOT DETECTED Final   Neisseria meningitidis NOT DETECTED NOT DETECTED Final   Pseudomonas aeruginosa NOT DETECTED NOT DETECTED Final   Stenotrophomonas maltophilia NOT DETECTED NOT DETECTED Final   Candida albicans NOT DETECTED NOT DETECTED Final   Candida auris NOT DETECTED NOT DETECTED Final   Candida glabrata NOT DETECTED NOT DETECTED Final   Candida krusei NOT DETECTED NOT DETECTED Final   Candida parapsilosis NOT DETECTED NOT DETECTED Final   Candida tropicalis NOT DETECTED NOT DETECTED Final   Cryptococcus neoformans/gattii NOT DETECTED NOT DETECTED Final   CTX-M ESBL NOT DETECTED NOT DETECTED Final   Carbapenem resistance IMP NOT DETECTED NOT DETECTED Final   Carbapenem resistance KPC NOT DETECTED NOT DETECTED Final   Carbapenem resistance NDM NOT DETECTED NOT DETECTED Final   Carbapenem resist OXA 48 LIKE NOT DETECTED NOT DETECTED Final   Carbapenem resistance VIM NOT DETECTED NOT DETECTED Final    Comment: Performed at [facility], [address] [place], [place] [address]  Blood Culture (routine x 2)     Status: None (Preliminary result)   Collection Time: 12/20/22  7:33 PM   Specimen: BLOOD RIGHT ARM  Result Value Ref Range Status   Specimen Description BLOOD RIGHT ARM  Final   Special Requests   Final    BOTTLES DRAWN AEROBIC AND ANAEROBIC Blood Culture adequate volume   Culture   Final    NO GROWTH 3 DAYS Performed at [facility], [address], [place], [place] [address]    Report Status PENDING  Incomplete  C Difficile Quick Screen w PCR reflex     Status: None   Collection Time: 12/21/22  5:51 PM   Specimen: Stool  Result Value Ref Range Status   C Diff antigen NEGATIVE NEGATIVE Final   C Diff toxin NEGATIVE NEGATIVE Final   C Diff interpretation No C. difficile  detected.  Final    Comment: Performed at [facility], [address] [place], [place] [address]  Gastrointestinal Panel by PCR , Stool     Status: None   Collection Time: 12/21/22  5:51 PM   Specimen: Stool  Result Value Ref Range Status   Campylobacter species NOT DETECTED NOT DETECTED Final   Plesimonas shigelloides NOT DETECTED NOT DETECTED Final   Salmonella species NOT DETECTED NOT DETECTED Final   Yersinia enterocolitica NOT DETECTED NOT DETECTED Final   Vibrio species NOT DETECTED NOT DETECTED Final   Vibrio cholerae NOT DETECTED NOT DETECTED Final   Enteroaggregative E coli (EAEC) NOT DETECTED NOT DETECTED Final   Enteropathogenic E coli (EPEC) NOT DETECTED NOT DETECTED Final   Enterotoxigenic E coli (ETEC) NOT DETECTED NOT DETECTED Final   Shiga like toxin producing E coli (STEC) NOT DETECTED NOT DETECTED Final   Shigella/Enteroinvasive E coli (EIEC) NOT DETECTED NOT DETECTED Final   Cryptosporidium NOT DETECTED NOT DETECTED Final   Cyclospora cayetanensis NOT DETECTED NOT DETECTED Final   Entamoeba histolytica NOT DETECTED NOT DETECTED Final   Giardia lamblia NOT DETECTED NOT DETECTED Final   Adenovirus F40/41 NOT DETECTED NOT DETECTED Final   Astrovirus NOT DETECTED NOT DETECTED Final   Norovirus GI/GII NOT DETECTED NOT DETECTED Final   Rotavirus Nevaan Bunton NOT DETECTED NOT DETECTED Final   Sapovirus (I, II, IV, and V) NOT DETECTED NOT DETECTED Final    Comment: Performed at [facility], [address] [place],  [place] [address]         Radiology Studies: US BIOPSY (LIVER)  Result Date: 12/23/2022 INDICATION: [id number] Liver lesion [id number] EXAM: ULTRASOUND GUIDED LIVER MASS BIOPSY COMPARISON:  CT AP, 12/20/2022.  US Abdomen, 12/21/2022 MEDICATIONS: None ANESTHESIA/SEDATION: Moderate (conscious) sedation was employed during this procedure. Jacklyn Branan total of Versed 1.5 mg and Fentanyl 100 mcg was administered intravenously. Moderate Sedation Time: 23  minutes. The patient's level of consciousness and vital signs were monitored continuously by radiology nursing throughout the procedure under my direct supervision. COMPLICATIONS: None immediate. PROCEDURE: Informed written consent was obtained from the patient and/or patient's representative after Maralee Higuchi discussion of the risks, benefits and alternatives to treatment. The patient understands and consents the procedure. Geovonni Meyerhoff timeout was performed prior to the initiation of the procedure. Ultrasound scanning was performed of the right upper abdominal quadrant demonstrates ill-defined RIGHT hepatic lobe liver mass The RIGHT hepatic lobe mass was selected for biopsy and the procedure was planned. The right upper abdominal quadrant was prepped and draped in the usual sterile fashion. The overlying soft tissues were anesthetized with 1% lidocaine with epinephrine. Wilborn Membreno 17 gauge, 6.8 cm co-axial needle was advanced into Josiane Labine peripheral aspect of the lesion. This was followed by 4 core biopsies with an 18 gauge core device under direct ultrasound guidance. The coaxial needle tract was embolized with Myron Lona small amount of Gel-Foam slurry and superficial hemostasis was obtained with manual compression. Post procedural scanning was negative for definitive area of hemorrhage or additional complication. Douglass Dunshee dressing was placed. The patient tolerated the procedure well without immediate post procedural complication. IMPRESSION: Successful ultrasound guided core needle biopsy of liver mass. Roanna Banning, MD [facility] Radiology Electronically Signed   By: Roanna Banning M.D.   On: 12/23/2022 17:32   MR ABDOMEN MRCP W WO CONTAST  Result Date: 12/23/2022 CLINICAL DATA:  Right upper quadrant abdominal pain. Liver lesions on previous imaging. EXAM: MRI ABDOMEN WITHOUT AND WITH CONTRAST (INCLUDING MRCP) TECHNIQUE: Multiplanar multisequence MR imaging of the abdomen was performed both before and after the  administration of intravenous contrast. Heavily T2-weighted images of the biliary and pancreatic ducts were obtained, and three-dimensional MRCP images were rendered by post processing. CONTRAST:  10mL GADAVIST GADOBUTROL 1 MMOL/ML IV SOLN COMPARISON:  Chest CT 12/21/2022.  Abdomen and pelvis CT 12/20/2022 FINDINGS: Lower chest: The pulmonary nodule seen on recent chest CT are not well demonstrated by MRI. Hepatobiliary: As noted on recent CT scan, 3 distinct lesions are noted in the liver. These are relatively subtle but do show some peripheral enhancement and evidence of restricted diffusion. Index lesion anterior hepatic dome measures 2.3 x 2.2 cm on T2 image 10 of series 7. Lesion more posterior in the hepatic dome measures 1.9 cm on the same image. Posterior right hepatic lobe lesion measures 14 mm on image 11 of series 7. Additional tiny hypoenhancing lesions seen in the liver parenchyma on previous CT are less well demonstrated by MRI. Intrahepatic bile ducts are somewhat prominent but there is no extrahepatic biliary duct dilatation. No evidence for choledocholithiasis. Gallbladder wall is diffusely thickened and edematous. Tiny dependent gallstones (2-3 mm) are seen in the dependent gallbladder lumen (coronal T2 image 12 of series 6 and axial T2 image 23 of series 7) MRCP images are motion degraded but some element of mass-effect on the confluence of the left and right hepatic ducts cannot be excluded.). Pancreas: No focal mass lesion. No dilatation of the main duct. No intraparenchymal cyst. No  peripancreatic edema. Spleen:  No splenomegaly. No focal mass lesion. Adrenals/Urinary Tract: No adrenal nodule or mass. Kidneys unremarkable. Stomach/Bowel: Stomach is unremarkable. No gastric wall thickening. No evidence of outlet obstruction. Duodenum is normally positioned as is the ligament of Treitz. No small bowel or colonic dilatation within the visualized abdomen. Vascular/Lymphatic: No abdominal aortic  aneurysm. No abdominal aortic atherosclerotic calcification. Main portal vein is patent. Right portal vein appears markedly attenuated and patency cannot be confirmed. This is similar to recent CT scan. SMV and splenic vein are patent. Bulky lymphadenopathy in the hepatoduodenal ligament again noted. There is associated retroperitoneal lymphadenopathy. Other:  No substantial intraperitoneal free fluid. Musculoskeletal: No focal suspicious marrow enhancement within the visualized bony anatomy. IMPRESSION: 1. 3 distinct lesions are noted in the liver parenchyma. These are relatively subtle but do show some peripheral enhancement and evidence of restricted diffusion. Additional tiny hypoenhancing lesions seen in the liver parenchyma on previous CT are less well demonstrated by MRI. Imaging features raise concern for metastatic disease. 2. Bulky lymphadenopathy in the hepatoduodenal ligament again noted. There is associated retroperitoneal lymphadenopathy. 3. Right portal vein appears markedly attenuated and patency cannot be confirmed. This is similar to recent CT scan. Main portal vein is patent. SMV and splenic vein are patent. 4. Cholelithiasis with apparent gallbladder wall thickening and mural edema. Acute cholecystitis not excluded. 5. MRCP images are motion degraded but there is no extrahepatic biliary duct dilatation. No evidence for choledocholithiasis. Some element of mass-effect on the confluence of the left and right hepatic ducts cannot be excluded. 6. The pulmonary nodule seen on recent chest CT are not well demonstrated by MRI. Electronically Signed   By: Kennith Center M.D.   On: 12/23/2022 06:46   MR 3D Recon At Scanner  Result Date: 12/23/2022 CLINICAL DATA:  Right upper quadrant abdominal pain. Liver lesions on previous imaging. EXAM: MRI ABDOMEN WITHOUT AND WITH CONTRAST (INCLUDING MRCP) TECHNIQUE: Multiplanar multisequence MR imaging of the abdomen was performed both before and after the  administration of intravenous contrast. Heavily T2-weighted images of the biliary and pancreatic ducts were obtained, and three-dimensional MRCP images were rendered by post processing. CONTRAST:  10mL GADAVIST GADOBUTROL 1 MMOL/ML IV SOLN COMPARISON:  Chest CT 12/21/2022.  Abdomen and pelvis CT 12/20/2022 FINDINGS: Lower chest: The pulmonary nodule seen on recent chest CT are not well demonstrated by MRI. Hepatobiliary: As noted on recent CT scan, 3 distinct lesions are noted in the liver. These are relatively subtle but do show some peripheral enhancement and evidence of restricted diffusion. Index lesion anterior hepatic dome measures 2.3 x 2.2 cm on T2 image 10 of series 7. Lesion more posterior in the hepatic dome measures 1.9 cm on the same image. Posterior right hepatic lobe lesion measures 14 mm on image 11 of series 7. Additional tiny hypoenhancing lesions seen in the liver parenchyma on previous CT are less well demonstrated by MRI. Intrahepatic bile ducts are somewhat prominent but there is no extrahepatic biliary duct dilatation. No evidence for choledocholithiasis. Gallbladder wall is diffusely thickened and edematous. Tiny dependent gallstones (2-3 mm) are seen in the dependent gallbladder lumen (coronal T2 image 12 of series 6 and axial T2 image 23 of series 7) MRCP images are motion degraded but some element of mass-effect on the confluence of the left and right hepatic ducts cannot be excluded.). Pancreas: No focal mass lesion. No dilatation of the main duct. No intraparenchymal cyst. No peripancreatic edema. Spleen:  No splenomegaly. No focal mass lesion. Adrenals/Urinary  Tract: No adrenal nodule or mass. Kidneys unremarkable. Stomach/Bowel: Stomach is unremarkable. No gastric wall thickening. No evidence of outlet obstruction. Duodenum is normally positioned as is the ligament of Treitz. No small bowel or colonic dilatation within the visualized abdomen. Vascular/Lymphatic: No abdominal aortic  aneurysm. No abdominal aortic atherosclerotic calcification. Main portal vein is patent. Right portal vein appears markedly attenuated and patency cannot be confirmed. This is similar to recent CT scan. SMV and splenic vein are patent. Bulky lymphadenopathy in the hepatoduodenal ligament again noted. There is associated retroperitoneal lymphadenopathy. Other:  No substantial intraperitoneal free fluid. Musculoskeletal: No focal suspicious marrow enhancement within the visualized bony anatomy. IMPRESSION: 1. 3 distinct lesions are noted in the liver parenchyma. These are relatively subtle but do show some peripheral enhancement and evidence of restricted diffusion. Additional tiny hypoenhancing lesions seen in the liver parenchyma on previous CT are less well demonstrated by MRI. Imaging features raise concern for metastatic disease. 2. Bulky lymphadenopathy in the hepatoduodenal ligament again noted. There is associated retroperitoneal lymphadenopathy. 3. Right portal vein appears markedly attenuated and patency cannot be confirmed. This is similar to recent CT scan. Main portal vein is patent. SMV and splenic vein are patent. 4. Cholelithiasis with apparent gallbladder wall thickening and mural edema. Acute cholecystitis not excluded. 5. MRCP images are motion degraded but there is no extrahepatic biliary duct dilatation. No evidence for choledocholithiasis. Some element of mass-effect on the confluence of the left and right hepatic ducts cannot be excluded. 6. The pulmonary nodule seen on recent chest CT are not well demonstrated by MRI. Electronically Signed   By: Kennith Center M.D.   On: 12/23/2022 06:46        Scheduled Meds:  cholecalciferol  400 Units Oral Daily   enoxaparin (LOVENOX) injection  40 mg Subcutaneous Q24H   insulin aspart  0-15 Units Subcutaneous TID WC   Continuous Infusions:  cefTRIAXone (ROCEPHIN)  IV 2 g (12/23/22 1437)   metronidazole 500 mg (12/23/22 1327)     LOS: 3 days     Time spent: over 30 min    Lacretia Nicks, MD [facility]   To contact the attending provider between 7A-7P or the covering provider during after hours 7P-7A, please log into the web site [email or web address] and access using universal [place] password for that web site. If you do not have the password, please call the hospital operator.  12/23/2022, 5:54 PM

## 2022-12-23 NOTE — Progress Notes (Signed)
Mobility Specialist - Progress Note   12/23/22 1113  Mobility  Activity Ambulated independently in hallway  Level of Assistance Independent  Assistive Device None  Distance Ambulated (ft) 700 ft  Activity Response Tolerated well  Mobility Referral Yes  $Mobility charge 1 Mobility  Mobility Specialist Start Time (ACUTE ONLY) 1058  Mobility Specialist Stop Time (ACUTE ONLY) 1111  Mobility Specialist Time Calculation (min) (ACUTE ONLY) 13 min   Pt received in bed and agreeable to mobility. No complaints during session. Pt to EOB after session with all needs met.     [facility]

## 2022-12-23 NOTE — Procedures (Signed)
Vascular and Interventional Radiology Procedure Note  Patient: Regina Mckay DOB: [birth year] Medical Record Number: [id number] Note Date/Time: 12/23/22 2:48 PM   Performing Physician: Roanna Banning, MD Assistant(s): None  Diagnosis: Liver mas(es). No DX   Procedure: LIVER MASS BIOPSY  Anesthesia: Conscious Sedation Complications: None Estimated Blood Loss: Minimal Specimens: Sent for Pathology  Findings:  Successful Ultrasound-guided biopsy of liver mass. A total of 3 samples were obtained. Hemostasis of the tract was achieved using Gelfoam Slurry Embolization.  Plan: Bed rest for 2 hours.  See detailed procedure note with images in PACS. The patient tolerated the procedure well without incident or complication and was returned to Floor Bed in stable condition.    Roanna Banning, MD [facility]   Pager. [phone] Clinic. [phone]

## 2022-12-24 DIAGNOSIS — B961 Klebsiella pneumoniae [K. pneumoniae] as the cause of diseases classified elsewhere: Secondary | ICD-10-CM | POA: Diagnosis not present

## 2022-12-24 DIAGNOSIS — A419 Sepsis, unspecified organism: Secondary | ICD-10-CM | POA: Diagnosis not present

## 2022-12-24 LAB — COMPREHENSIVE METABOLIC PANEL
ALT: 55 U/L — ABNORMAL HIGH (ref 0–44)
AST: 67 U/L — ABNORMAL HIGH (ref 15–41)
Albumin: 2.5 g/dL — ABNORMAL LOW (ref 3.5–5.0)
Alkaline Phosphatase: 117 U/L (ref 38–126)
Anion gap: 8 (ref 5–15)
BUN: 14 mg/dL (ref 8–23)
CO2: 26 mmol/L (ref 22–32)
Calcium: 8 mg/dL — ABNORMAL LOW (ref 8.9–10.3)
Chloride: 104 mmol/L (ref 98–111)
Creatinine, Ser: 0.7 mg/dL (ref 0.44–1.00)
GFR, Estimated: >60 mL/min (ref >60)
Glucose, Bld: 136 mg/dL — ABNORMAL HIGH (ref 70–99)
Potassium: 3.6 mmol/L (ref 3.5–5.1)
Sodium: 138 mmol/L (ref 135–145)
Total Bilirubin: 2.2 mg/dL — ABNORMAL HIGH (ref 0.3–1.2)
Total Protein: 6.1 g/dL — ABNORMAL LOW (ref 6.5–8.1)

## 2022-12-24 LAB — CBC WITH DIFFERENTIAL/PLATELET
Abs Immature Granulocytes: 0.03 10*3/uL (ref 0.00–0.07)
Basophils Absolute: 0 10*3/uL (ref 0.0–0.1)
Basophils Relative: 0 %
Eosinophils Absolute: 0 10*3/uL (ref 0.0–0.5)
Eosinophils Relative: 1 %
HCT: 39.7 % (ref 36.0–46.0)
Hemoglobin: 12.9 g/dL (ref 12.0–15.0)
Immature Granulocytes: 0 %
Lymphocytes Relative: 10 %
Lymphs Abs: 0.7 10*3/uL (ref 0.7–4.0)
MCH: 28.7 pg (ref 26.0–34.0)
MCHC: 32.5 g/dL (ref 30.0–36.0)
MCV: 88.2 fL (ref 80.0–100.0)
Monocytes Absolute: 1.2 10*3/uL — ABNORMAL HIGH (ref 0.1–1.0)
Monocytes Relative: 17 %
Neutro Abs: 5.1 10*3/uL (ref 1.7–7.7)
Neutrophils Relative %: 72 %
Platelets: 207 10*3/uL (ref 150–400)
RBC: 4.5 MIL/uL (ref 3.87–5.11)
RDW: 12.7 % (ref 11.5–15.5)
WBC: 7.1 10*3/uL (ref 4.0–10.5)
nRBC: 0 % (ref 0.0–0.2)

## 2022-12-24 LAB — MAGNESIUM: Magnesium: 1.8 mg/dL (ref 1.7–2.4)

## 2022-12-24 LAB — GLUCOSE, CAPILLARY
Glucose-Capillary: 112 mg/dL — ABNORMAL HIGH (ref 70–99)
Glucose-Capillary: 149 mg/dL — ABNORMAL HIGH (ref 70–99)

## 2022-12-24 LAB — PHOSPHORUS: Phosphorus: 2.2 mg/dL — ABNORMAL LOW (ref 2.5–4.6)

## 2022-12-24 LAB — CULTURE, BLOOD (ROUTINE X 2): Special Requests: ADEQUATE

## 2022-12-24 MED ORDER — AMOXICILLIN-POT CLAVULANATE 875-125 MG PO TABS: 1.0000 | ORAL_TABLET | Freq: Two times a day (BID) | ORAL | 0 refills | Status: DC

## 2022-12-24 MED ORDER — EPINEPHRINE 0.3 MG/0.3ML IJ SOAJ
0.3000 mg | Freq: Once | INTRAMUSCULAR | Status: DC | PRN
  Filled 2022-12-24: qty 0.3

## 2022-12-24 MED ORDER — AMOXICILLIN 500 MG PO CAPS
500.0000 mg | ORAL_CAPSULE | Freq: Once | ORAL | Status: AC
  Administered 2022-12-24: 500 mg via ORAL
  Filled 2022-12-24: qty 1

## 2022-12-24 MED ORDER — OXYCODONE HCL 5 MG PO TABS: 2.5000 mg | ORAL_TABLET | ORAL | 0 refills | Status: DC | PRN

## 2022-12-24 MED ORDER — DIPHENHYDRAMINE HCL 50 MG/ML IJ SOLN: 25.0000 mg | Freq: Once | INTRAMUSCULAR | Status: DC | PRN

## 2022-12-24 NOTE — Discharge Instructions (Addendum)
Follow with [name], NP in 2 days as scheduled Follow up with Dr Thedore Mins on June 3rd as scheduled  Please get a complete blood count and chemistry panel checked by your Primary MD at your next visit, and again as instructed by your Primary MD. Please get your medications reviewed and adjusted by your Primary MD.  Please request your Primary MD to go over all Hospital Tests and Procedure/Radiological results at the follow up, please get all Hospital records sent to your Prim MD by signing hospital release before you go home.  In some cases, there will be blood work, cultures and biopsy results pending at the time of your discharge. Please request that your primary care M.D. goes through all the records of your hospital data and follows up on these results.  If you had Pneumonia of Lung problems at the Hospital: Please get a 2 view Chest X ray done in 6-8 weeks after hospital discharge or sooner if instructed by your Primary MD.  If you have Congestive Heart Failure: Please call your Cardiologist or Primary MD anytime you have any of the following symptoms:  1) 3 pound weight gain in 24 hours or 5 pounds in 1 week  2) shortness of breath, with or without a dry hacking cough  3) swelling in the hands, feet or stomach  4) if you have to sleep on extra pillows at night in order to breathe  Follow cardiac low salt diet and 1.5 lit/day fluid restriction.  If you have diabetes Accuchecks 4 times/day, Once in AM empty stomach and then before each meal. Log in all results and show them to your primary doctor at your next visit. If any glucose reading is under 80 or above 300 call your primary MD immediately.  If you have Seizure/Convulsions/Epilepsy: Please do not drive, operate heavy machinery, participate in activities at heights or participate in high speed sports until you have seen by Primary MD or a Neurologist and advised to do so again. Per [facility] statutes, patients with  seizures are not allowed to drive until they have been seizure-free for six months.  Use caution when using heavy equipment or power tools. Avoid working on ladders or at heights. Take showers instead of baths. Ensure the water temperature is not too high on the home water heater. Do not go swimming alone. Do not lock yourself in a room alone (i.e. bathroom). When caring for infants or small children, sit down when holding, feeding, or changing them to minimize risk of injury to the child in the event you have a seizure. Maintain good sleep hygiene. Avoid alcohol.   If you had Gastrointestinal Bleeding: Please ask your Primary MD to check a complete blood count within one week of discharge or at your next visit. Your endoscopic/colonoscopic biopsies that are pending at the time of discharge, will also need to followed by your Primary MD.  Get Medicines reviewed and adjusted. Please take all your medications with you for your next visit with your Primary MD  Please request your Primary MD to go over all hospital tests and procedure/radiological results at the follow up, please ask your Primary MD to get all Hospital records sent to his/her office.  If you experience worsening of your admission symptoms, develop shortness of breath, life threatening emergency, suicidal or homicidal thoughts you must seek medical attention immediately by calling 911 or calling your MD immediately  if symptoms less severe.  You must read complete instructions/literature along  with all the possible adverse reactions/side effects for all the Medicines you take and that have been prescribed to you. Take any new Medicines after you have completely understood and accpet all the possible adverse reactions/side effects.   Do not drive or operate heavy machinery when taking Pain medications.   Do not take more than prescribed Pain, Sleep and Anxiety Medications  Special Instructions: If you have smoked or chewed Tobacco  in  the last 2 yrs please stop smoking, stop any regular Alcohol  and or any Recreational drug use.  Wear Seat belts while driving.  Please note You were cared for by a hospitalist during your hospital stay. If you have any questions about your discharge medications or the care you received while you were in the hospital after you are discharged, you can call the unit and asked to speak with the hospitalist on call if the hospitalist that took care of you is not available. Once you are discharged, your primary care physician will handle any further medical issues. Please note that NO REFILLS for any discharge medications will be authorized once you are discharged, as it is imperative that you return to your primary care physician (or establish a relationship with a primary care physician if you do not have one) for your aftercare needs so that they can reassess your need for medications and monitor your lab values.  You can reach the hospitalist office at phone [phone] or fax [phone]   If you do not have a primary care physician, you can call [phone] for a physician referral.  Activity: As tolerated with Full fall precautions use walker/cane & assistance as needed    Diet: regular  Disposition Home

## 2022-12-24 NOTE — Discharge Summary (Signed)
Physician Discharge Summary  Regina Mckay WGN:[id number] DOB: Oct 26, 1950 DOA: 12/20/2022  PCP: [name], NP  Admit date: 12/20/2022 Discharge date: 12/24/2022  Admitted From: home Disposition:  home  Recommendations for Outpatient Follow-up:  Follow up with PCP as scheduled in 2 days Hem / Onc will call patient with an appointment Follow up on biopsy results as an outpatient  Home Health: none Equipment/Devices: none  Discharge Condition: stable CODE STATUS: Full code  HPI: Per admitting MD, Regina Mckay is a 72 y.o. 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  MRCP without choledocholithiasis.  She has some gallbladder wall thickening and mural edema, but NO RUQ abdominal pain.  Surgery was previously consulted and recommended against HIDA scan given low clinical suspicion for cholecystitis with absence of pain on exam. UA with negative nitrite, negative leukocytes -> 0-5 RBC, 0-5 WBC, no bacteria -> not suggestive of UTI. ID consulted and patient was placed on Augmentin with 14 days recommended on discharge, and she has outpatient follow-up with ID on 6/3.   Active problems Possible metastatic malignancy - CT abd/pelvis with multiple low density lesions throughout  the liver and abdominal/retroperitoneal lymphadenopathy, concerning for metastatic disease/malignancy.   CT chest with at least 5 solid pulmonary nodules, equivocal for pulm mets, L supraclavicular and low posterior mediastinal LAD, concerning for nodal mets. CEA elevated, AFP mildly elevated. She had negative mammogram 2023.  Years since last pap smear.  She's never had colon cancer screening.  IR was consulted and is status post ultrasound-guided liver mass biopsy.  Discussed with the oncology team, Dr. Pamelia Hoit and Georga Kaufmann PA, and will have close follow-up in the oncology clinic. Elevated LFTs  Hyperbilirubinemia - Follow RUQ [place] -> cholelithiasis with mild gallbladder wall thickening. MRCP with 3 distinct lesions noted in the liver parenchyma.  Additional tiny hypoenhancing lesions seen in the liver parenchyma on previous CT less well demonstrated by MRI.  Imaging features concerning for metastatic disease.  Bulkly lymphadenopathy in the hepatodudenal ligament.  Retroperitoneal LAD.  Right portal vein appears markedly attenduated and patency cannot be confirmed.  Main portal vein is patent.  Cholelithiasis with apparent gallbladder wall thickening and mural edema.  MRCP images motion degraded, but no extrahepatic biliary duct dilatation.  No evidence for choledocholithiasis Hypodense Posterior R thyroid Nodule - Follow thyroid US outpatient Seborrheic Keratosis - Large SK on back, appears most c/w SK to me on exam Hyponatremia - resolved AKI - resolved  IDDM2 - A1c 5.8 11/2022. Outpatient follow up with endocrine   Body mass index is 42.51 kg/m.  Meet class III obesity criteria  Discharge Instructions  Discharge Instructions     Ambulatory referral to Hematology / Oncology   Complete by: As directed       Allergies as of 12/24/2022  Reactions   Amoxicillin Shortness Of Breath   Rapid breathing in her 20's, no rash or swelling associated, required no medication for treatment         Medication List     TAKE these medications    acetaminophen 500 MG tablet Commonly known as: TYLENOL Take 500 mg by mouth every 4 (four) hours as needed for mild pain or moderate pain.   amLODipine 10 MG tablet Commonly known as: NORVASC Take 1 tablet (10 mg total) by mouth daily. Follow-up appt due in July must see provider for future refills   amoxicillin-clavulanate 875-125 MG tablet Commonly known as: AUGMENTIN Take 1 tablet by mouth 2 (two) times daily for 14 days.   Basaglar KwikPen 100 UNIT/ML Inject 22 Units into the skin daily.   bismuth subsalicylate 262 MG chewable tablet Commonly known as: PEPTO BISMOL Chew 524 mg by mouth as needed for indigestion or diarrhea or loose stools.   cholecalciferol 10 MCG (400 UNIT) Tabs tablet Commonly known as: VITAMIN D3 Take 400 Units by mouth daily.   FreeStyle Libre 2 Reader Nickerson 1 each by Does not apply route daily.   FreeStyle Libre 2 Sensor Misc USE 1 SENSOR EVERY 14 DAYS AS DIRECTED What changed: See the new instructions.   GLUCOSE PO Take 1 tablet by mouth See admin instructions. Patient uses Glucose Tablets with 15 grams of carbohydrates.   hydrochlorothiazide 12.5 MG capsule Commonly known as: MICROZIDE Take 1 capsule (12.5 mg total) by mouth daily.   Insulin Pen Needle 32G X 4 MM Misc Use 4x a day What changed:  how much to take how to take this when to take this   lidocaine 5 % Commonly known as: LIDODERM PLACE 1 PATCH ONTO THE SKIN DAILY. REMOVE& DISCARD PATCH WITHIN 12 HOURS   lisinopril 20 MG tablet Commonly known as: ZESTRIL Take 1 tablet (20 mg total) by mouth daily. Follow-up appt due in July   NovoLOG FlexPen 100 UNIT/ML FlexPen Generic drug: insulin aspart Inject 5-7 units under skin up to 2x a day What changed:  how much to take how to take this when to take this   oxyCODONE 5 MG immediate release tablet Commonly known as: Oxy IR/ROXICODONE Take 0.5-1 tablets (2.5-5 mg total) by  mouth every 4 (four) hours as needed for moderate pain.   pravastatin 40 MG tablet Commonly known as: PRAVACHOL Take 1 tablet (40 mg total) by mouth daily.   Semaglutide(0.25 or 0.5MG /DOS) 2 MG/3ML Sopn Inject 0.25-0.5 mg into the skin once a week.        Follow-up Information     Briant Cedar, PA-C Follow up in 1 week(s).   Specialty: Hematology and Oncology Contact information: [address] [place] [address] [phone]                 Consultations: ID General surgery   Procedures/Studies:  US BIOPSY (LIVER)  Result Date: 12/23/2022 INDICATION: [id number] Liver lesion [id number] EXAM: ULTRASOUND GUIDED LIVER MASS BIOPSY COMPARISON:  CT AP, 12/20/2022.  US Abdomen, 12/21/2022 MEDICATIONS: None ANESTHESIA/SEDATION: Moderate (conscious) sedation was employed during this procedure. A total of Versed 1.5 mg and Fentanyl 100 mcg was administered intravenously. Moderate Sedation Time: 23 minutes. The patient's level of consciousness and vital signs were monitored continuously by radiology nursing throughout the procedure under my direct supervision. COMPLICATIONS: None immediate. PROCEDURE: Informed written consent was obtained from the patient and/or patient's representative after a discussion of the risks, benefits and alternatives to treatment.  The patient understands and consents the procedure. A timeout was performed prior to the initiation of the procedure. Ultrasound scanning was performed of the right upper abdominal quadrant demonstrates ill-defined RIGHT hepatic lobe liver mass The RIGHT hepatic lobe mass was selected for biopsy and the procedure was planned. The right upper abdominal quadrant was prepped and draped in the usual sterile fashion. The overlying soft tissues were anesthetized with 1% lidocaine with epinephrine. A 17 gauge, 6.8 cm co-axial needle was advanced into a peripheral aspect of the lesion. This was followed by 4 core biopsies with an 18 gauge  core device under direct ultrasound guidance. The coaxial needle tract was embolized with a small amount of Gel-Foam slurry and superficial hemostasis was obtained with manual compression. Post procedural scanning was negative for definitive area of hemorrhage or additional complication. A dressing was placed. The patient tolerated the procedure well without immediate post procedural complication. IMPRESSION: Successful ultrasound guided core needle biopsy of liver mass. Roanna Banning, MD [facility] Electronically Signed   By: Roanna Banning M.D.   On: 12/23/2022 17:32   MR ABDOMEN MRCP W WO CONTAST  Result Date: 12/23/2022 CLINICAL DATA:  Right upper quadrant abdominal pain. Liver lesions on previous imaging. EXAM: MRI ABDOMEN WITHOUT AND WITH CONTRAST (INCLUDING MRCP) TECHNIQUE: Multiplanar multisequence MR imaging of the abdomen was performed both before and after the administration of intravenous contrast. Heavily T2-weighted images of the biliary and pancreatic ducts were obtained, and three-dimensional MRCP images were rendered by post processing. CONTRAST:  10mL GADAVIST GADOBUTROL 1 MMOL/ML IV SOLN COMPARISON:  Chest CT 12/21/2022.  Abdomen and pelvis CT 12/20/2022 FINDINGS: Lower chest: The pulmonary nodule seen on recent chest CT are not well demonstrated by MRI. Hepatobiliary: As noted on recent CT scan, 3 distinct lesions are noted in the liver. These are relatively subtle but do show some peripheral enhancement and evidence of restricted diffusion. Index lesion anterior hepatic dome measures 2.3 x 2.2 cm on T2 image 10 of series 7. Lesion more posterior in the hepatic dome measures 1.9 cm on the same image. Posterior right hepatic lobe lesion measures 14 mm on image 11 of series 7. Additional tiny hypoenhancing lesions seen in the liver parenchyma on previous CT are less well demonstrated by MRI. Intrahepatic bile ducts are somewhat prominent  but there is no extrahepatic biliary duct dilatation. No evidence for choledocholithiasis. Gallbladder wall is diffusely thickened and edematous. Tiny dependent gallstones (2-3 mm) are seen in the dependent gallbladder lumen (coronal T2 image 12 of series 6 and axial T2 image 23 of series 7) MRCP images are motion degraded but some element of mass-effect on the confluence of the left and right hepatic ducts cannot be excluded.). Pancreas: No focal mass lesion. No dilatation of the main duct. No intraparenchymal cyst. No peripancreatic edema. Spleen:  No splenomegaly. No focal mass lesion. Adrenals/Urinary Tract: No adrenal nodule or mass. Kidneys unremarkable. Stomach/Bowel: Stomach is unremarkable. No gastric wall thickening. No evidence of outlet obstruction. Duodenum is normally positioned as is the ligament of Treitz. No small bowel or colonic dilatation within the visualized abdomen. Vascular/Lymphatic: No abdominal aortic aneurysm. No abdominal aortic atherosclerotic calcification. Main portal vein is patent. Right portal vein appears markedly attenuated and patency cannot be confirmed. This is similar to recent CT scan. SMV and splenic vein are patent. Bulky lymphadenopathy in the hepatoduodenal ligament again noted. There is associated retroperitoneal lymphadenopathy. Other:  No substantial intraperitoneal free fluid. Musculoskeletal: No focal suspicious  marrow enhancement within the visualized bony anatomy. IMPRESSION: 1. 3 distinct lesions are noted in the liver parenchyma. These are relatively subtle but do show some peripheral enhancement and evidence of restricted diffusion. Additional tiny hypoenhancing lesions seen in the liver parenchyma on previous CT are less well demonstrated by MRI. Imaging features raise concern for metastatic disease. 2. Bulky lymphadenopathy in the hepatoduodenal ligament again noted. There is associated retroperitoneal lymphadenopathy. 3. Right portal vein appears markedly  attenuated and patency cannot be confirmed. This is similar to recent CT scan. Main portal vein is patent. SMV and splenic vein are patent. 4. Cholelithiasis with apparent gallbladder wall thickening and mural edema. Acute cholecystitis not excluded. 5. MRCP images are motion degraded but there is no extrahepatic biliary duct dilatation. No evidence for choledocholithiasis. Some element of mass-effect on the confluence of the left and right hepatic ducts cannot be excluded. 6. The pulmonary nodule seen on recent chest CT are not well demonstrated by MRI. Electronically Signed   By: Kennith Center M.D.   On: 12/23/2022 06:46   MR 3D Recon At Scanner  Result Date: 12/23/2022 CLINICAL DATA:  Right upper quadrant abdominal pain. Liver lesions on previous imaging. EXAM: MRI ABDOMEN WITHOUT AND WITH CONTRAST (INCLUDING MRCP) TECHNIQUE: Multiplanar multisequence MR imaging of the abdomen was performed both before and after the administration of intravenous contrast. Heavily T2-weighted images of the biliary and pancreatic ducts were obtained, and three-dimensional MRCP images were rendered by post processing. CONTRAST:  10mL GADAVIST GADOBUTROL 1 MMOL/ML IV SOLN COMPARISON:  Chest CT 12/21/2022.  Abdomen and pelvis CT 12/20/2022 FINDINGS: Lower chest: The pulmonary nodule seen on recent chest CT are not well demonstrated by MRI. Hepatobiliary: As noted on recent CT scan, 3 distinct lesions are noted in the liver. These are relatively subtle but do show some peripheral enhancement and evidence of restricted diffusion. Index lesion anterior hepatic dome measures 2.3 x 2.2 cm on T2 image 10 of series 7. Lesion more posterior in the hepatic dome measures 1.9 cm on the same image. Posterior right hepatic lobe lesion measures 14 mm on image 11 of series 7. Additional tiny hypoenhancing lesions seen in the liver parenchyma on previous CT are less well demonstrated by MRI. Intrahepatic bile ducts are somewhat prominent but  there is no extrahepatic biliary duct dilatation. No evidence for choledocholithiasis. Gallbladder wall is diffusely thickened and edematous. Tiny dependent gallstones (2-3 mm) are seen in the dependent gallbladder lumen (coronal T2 image 12 of series 6 and axial T2 image 23 of series 7) MRCP images are motion degraded but some element of mass-effect on the confluence of the left and right hepatic ducts cannot be excluded.). Pancreas: No focal mass lesion. No dilatation of the main duct. No intraparenchymal cyst. No peripancreatic edema. Spleen:  No splenomegaly. No focal mass lesion. Adrenals/Urinary Tract: No adrenal nodule or mass. Kidneys unremarkable. Stomach/Bowel: Stomach is unremarkable. No gastric wall thickening. No evidence of outlet obstruction. Duodenum is normally positioned as is the ligament of Treitz. No small bowel or colonic dilatation within the visualized abdomen. Vascular/Lymphatic: No abdominal aortic aneurysm. No abdominal aortic atherosclerotic calcification. Main portal vein is patent. Right portal vein appears markedly attenuated and patency cannot be confirmed. This is similar to recent CT scan. SMV and splenic vein are patent. Bulky lymphadenopathy in the hepatoduodenal ligament again noted. There is associated retroperitoneal lymphadenopathy. Other:  No substantial intraperitoneal free fluid. Musculoskeletal: No focal suspicious marrow enhancement within the visualized bony anatomy. IMPRESSION: 1. 3 distinct  lesions are noted in the liver parenchyma. These are relatively subtle but do show some peripheral enhancement and evidence of restricted diffusion. Additional tiny hypoenhancing lesions seen in the liver parenchyma on previous CT are less well demonstrated by MRI. Imaging features raise concern for metastatic disease. 2. Bulky lymphadenopathy in the hepatoduodenal ligament again noted. There is associated retroperitoneal lymphadenopathy. 3. Right portal vein appears markedly  attenuated and patency cannot be confirmed. This is similar to recent CT scan. Main portal vein is patent. SMV and splenic vein are patent. 4. Cholelithiasis with apparent gallbladder wall thickening and mural edema. Acute cholecystitis not excluded. 5. MRCP images are motion degraded but there is no extrahepatic biliary duct dilatation. No evidence for choledocholithiasis. Some element of mass-effect on the confluence of the left and right hepatic ducts cannot be excluded. 6. The pulmonary nodule seen on recent chest CT are not well demonstrated by MRI. Electronically Signed   By: Kennith Center M.D.   On: 12/23/2022 06:46   CT CHEST W CONTRAST  Result Date: 12/22/2022 CLINICAL DATA:  Inpatient. Abnormal CT abdomen/pelvis study with liver lesions and retroperitoneal adenopathy suspicious for metastatic disease of uncertain primary. * Tracking Code: BO * EXAM: CT CHEST WITH CONTRAST TECHNIQUE: Multidetector CT imaging of the chest was performed during intravenous contrast administration. RADIATION DOSE REDUCTION: This exam was performed according to the departmental dose-optimization program which includes automated exposure control, adjustment of the mA and/or kV according to patient size and/or use of iterative reconstruction technique. CONTRAST:  75mL OMNIPAQUE IOHEXOL 300 MG/ML  SOLN COMPARISON:  12/20/2022 CT abdomen/pelvis and chest radiograph. FINDINGS: Cardiovascular: Normal heart size. No significant pericardial effusion/thickening. Three-vessel coronary atherosclerosis. Atherosclerotic nonaneurysmal thoracic aorta. Normal caliber pulmonary arteries. No central pulmonary emboli. Mediastinum/Nodes: Hypodense posterior right thyroid 1.5 cm nodule. Unremarkable esophagus. Mild left supraclavicular adenopathy up to 1.0 cm (series 2/image 15). No axillary adenopathy. Mildly enlarged low posterior mediastinal para-lymph nodes up to 1.2 cm on the right (series 2/image 104). No hilar adenopathy. Lungs/Pleura: No  pneumothorax. No pleural effusion. No acute consolidative airspace disease or lung masses. At least five solid pulmonary nodules scattered in the lungs bilaterally, largest 0.5 cm in the posteromedial basilar subpleural right lower lobe (series 6/image 107), 0.4 cm in the medial left lower lobe (series 6/image 100) and 0.4 cm in the anterior right middle lobe (series 6/image 70). Upper abdomen: Multiple persistent hypodense superior right liver lesions, poorly defined on this chest CT study. Subcentimeter hypodense posterior upper left renal cortical lesion, too small to characterize, for which no follow-up imaging is recommended. Partially visualized porta hepatis, portacaval and aortocaval adenopathy in the upper abdomen, better seen on CT abdomen study from 1 day prior. Musculoskeletal: No aggressive appearing focal osseous lesions. Marked thoracic degenerative disc disease. IMPRESSION: 1. At least five solid pulmonary nodules scattered in the lungs bilaterally, largest 0.5 cm, equivocal for pulmonary metastases. Recommend attention on follow-up chest CT in 3 months. 2. Mild left supraclavicular and low posterior mediastinal lymphadenopathy, suspicious for nodal metastases. 3. Multiple persistent hypodense superior right liver lesions and partially visualized porta hepatis, portacaval and aortocaval adenopathy in the upper abdomen, all better seen on CT abdomen study from 1 day prior. 4. Hypodense posterior right thyroid 1.5 cm nodule. Recommend non-emergent thyroid ultrasound. Reference: J Am Coll Radiol. 2015 Feb;12(2): 143-50 5. Three-vessel coronary atherosclerosis. 6.  Aortic Atherosclerosis (ICD10-I70.0). Electronically Signed   By: Delbert Phenix M.D.   On: 12/22/2022 08:00   US Abdomen Limited RUQ (LIVER/GB)  Result Date: 12/21/2022 CLINICAL DATA:  Abdominal pain EXAM: ULTRASOUND ABDOMEN LIMITED RIGHT UPPER QUADRANT COMPARISON:  CT from the previous day FINDINGS: Gallbladder: Gallbladder incompletely  distended. Mild gallbladder wall thickening up to 6 mm. To nerve root reports no sonographic Murphy sign however. Several gallstones measuring up to 8 mm in the lumen. Common bile duct: Diameter: 4 mm.  No intrahepatic biliary ductal dilatation. Liver: Scattered echogenic lesions corresponding to CT findings. Portal vein is patent on color Doppler imaging with normal direction of blood flow towards the liver. Other: Technologist describes technically difficult study secondary to patient breathing, overlying bowel, body habitus. IMPRESSION: 1. Cholelithiasis with mild gallbladder wall thickening. No sonographic Murphy sign. 2. No biliary dilatation. 3. Scattered echogenic liver lesions corresponding to CT findings, presumed metastatic disease. Electronically Signed   By: Corlis Leak M.D.   On: 12/21/2022 11:16   CT ABDOMEN PELVIS W CONTRAST  Result Date: 12/20/2022 CLINICAL DATA:  72 year old female with acute abdominal and pelvic pain. EXAM: CT ABDOMEN AND PELVIS WITH CONTRAST TECHNIQUE: Multidetector CT imaging of the abdomen and pelvis was performed using the standard protocol following bolus administration of intravenous contrast. RADIATION DOSE REDUCTION: This exam was performed according to the departmental dose-optimization program which includes automated exposure control, adjustment of the mA and/or kV according to patient size and/or use of iterative reconstruction technique. CONTRAST:  OMNIPAQUE IOHEXOL 300 MG/ML  SOLN COMPARISON:  05/17/2019 CT and prior studies FINDINGS: Lower chest: No acute abnormality. Hepatobiliary: There are multiple low-density lesions within both the RIGHT and LEFT liver, with index 1.5 x 2 cm lesion and 1.5 x 2.5 cm lesion at the dome (image 10: Series 2). Gallbladder wall thickening and cholelithiasis noted. No definite biliary dilatation identified. Pancreas: Unremarkable Spleen: Unremarkable Adrenals/Urinary Tract: Punctate nonobstructing bilateral renal calculi are  again identified without other significant change. There is no evidence of suspicious renal mass hydronephrosis. The adrenal glands and bladder are unremarkable. Stomach/Bowel: No definite bowel wall thickening noted. There is no evidence of bowel obstruction or inflammatory changes. Vascular/Lymphatic: Periportal, mesenteric, periaortic and retroperitoneal adenopathy identified with the index 1.6 x 3.9 cm portacaval node (22:2) identified. Aortic atherosclerotic calcifications noted without aneurysm. Reproductive: Uterus and adnexal regions are unchanged with ureteral calcifications/fibroids. Other: No ascites, focal collection or pneumoperitoneum. Musculoskeletal: No acute or suspicious bony abnormalities are noted. Moderate degenerative disc disease/spondylosis at T12-L1 and L4-5 again noted. IMPRESSION: 1. Multiple low-density lesions throughout the liver and abdominal/retroperitoneal lymphadenopathy, highly suspicious for malignancy/metastatic disease. Primary malignancy is not identified on this examination. 2. Gallbladder wall thickening and cholelithiasis without definite biliary dilatation. If there is strong clinical suspicion for acute cholecystitis, recommend ultrasound or nuclear medicine study. 3. Punctate nonobstructing bilateral renal calculi. 4.  Aortic Atherosclerosis (ICD10-I70.0). Electronically Signed   By: Harmon Pier M.D.   On: 12/20/2022 15:38   DG Chest Port 1 View  Result Date: 12/20/2022 CLINICAL DATA:  Nausea and vomiting.  Melena.  Sepsis. EXAM: PORTABLE CHEST 1 VIEW COMPARISON:  10/10/2020 FINDINGS: Patient is rotated to the left. Heart size is within normal limits. Aortic atherosclerotic calcification incidentally noted. Both lungs are clear. IMPRESSION: No active disease. Electronically Signed   By: Danae Orleans M.D.   On: 12/20/2022 13:55     Subjective: - no chest pain, shortness of breath, no abdominal pain, nausea or vomiting.   Discharge Exam: BP (!) 163/70 (BP  Location: Right Arm)   Pulse 73   Temp 98.4 F (36.9 C) (Oral)   Resp 19  Ht 5\' 3"  (1.6 m)   Wt 108.9 kg   SpO2 100%   BMI 42.51 kg/m   General: Pt is alert, awake, not in acute distress Cardiovascular: RRR, S1/S2 +, no rubs, no gallops Respiratory: CTA bilaterally, no wheezing, no rhonchi Abdominal: Soft, NT, ND, bowel sounds + Extremities: no edema, no cyanosis  The results of significant diagnostics from this hospitalization (including imaging, microbiology, ancillary and laboratory) are listed below for reference.     Microbiology: Recent Results (from the past 240 hour(s))  Resp panel by RT-PCR (RSV, Flu A&B, Covid) Anterior Nasal Swab     Status: None   Collection Time: 12/20/22 12:24 PM   Specimen: Anterior Nasal Swab  Result Value Ref Range Status   SARS Coronavirus 2 by RT PCR NEGATIVE NEGATIVE Final    Comment: (NOTE) SARS-CoV-2 target nucleic acids are NOT DETECTED.  The SARS-CoV-2 RNA is generally detectable in upper respiratory specimens during the acute phase of infection. The lowest concentration of SARS-CoV-2 viral copies this assay can detect is 138 copies/mL. A negative result does not preclude SARS-Cov-2 infection and should not be used as the sole basis for treatment or other patient management decisions. A negative result may occur with  improper specimen collection/handling, submission of specimen other than nasopharyngeal swab, presence of viral mutation(s) within the areas targeted by this assay, and inadequate number of viral copies(<138 copies/mL). A negative result must be combined with clinical observations, patient history, and epidemiological information. The expected result is Negative.  Fact Sheet for Patients:  BloggerCourse.com  Fact Sheet for Healthcare Providers:  SeriousBroker.it  This test is no t yet approved or cleared by the [place] FDA and  has been authorized for  detection and/or diagnosis of SARS-CoV-2 by FDA under an Emergency Use Authorization (EUA). This EUA will remain  in effect (meaning this test can be used) for the duration of the COVID-19 declaration under Section 564(b)(1) of the Act, 21 U.S.C.section 360bbb-3(b)(1), unless the authorization is terminated  or revoked sooner.       Influenza A by PCR NEGATIVE NEGATIVE Final   Influenza B by PCR NEGATIVE NEGATIVE Final    Comment: (NOTE) The Xpert Xpress SARS-CoV-2/FLU/RSV plus assay is intended as an aid in the diagnosis of influenza from Nasopharyngeal swab specimens and should not be used as a sole basis for treatment. Nasal washings and aspirates are unacceptable for Xpert Xpress SARS-CoV-2/FLU/RSV testing.  Fact Sheet for Patients: BloggerCourse.com  Fact Sheet for Healthcare Providers: SeriousBroker.it  This test is not yet approved or cleared by the [place] FDA and has been authorized for detection and/or diagnosis of SARS-CoV-2 by FDA under an Emergency Use Authorization (EUA). This EUA will remain in effect (meaning this test can be used) for the duration of the COVID-19 declaration under Section 564(b)(1) of the Act, 21 U.S.C. section 360bbb-3(b)(1), unless the authorization is terminated or revoked.     Resp Syncytial Virus by PCR NEGATIVE NEGATIVE Final    Comment: (NOTE) Fact Sheet for Patients: BloggerCourse.com  Fact Sheet for Healthcare Providers: SeriousBroker.it  This test is not yet approved or cleared by the [place] FDA and has been authorized for detection and/or diagnosis of SARS-CoV-2 by FDA under an Emergency Use Authorization (EUA). This EUA will remain in effect (meaning this test can be used) for the duration of the COVID-19 declaration under Section 564(b)(1) of the Act, 21 U.S.C. section 360bbb-3(b)(1), unless the authorization is  terminated or revoked.  Performed at [facility], [address], [place], [place] [address]   Blood Culture (routine x 2)     Status: Abnormal   Collection Time: 12/20/22  1:08 PM   Specimen: BLOOD  Result Value Ref Range Status   Specimen Description   Final    BLOOD LEFT ANTECUBITAL Performed at [facility], [address] [place], [place] [address]    Special Requests   Final    BOTTLES DRAWN AEROBIC AND ANAEROBIC Blood Culture adequate volume Performed at [facility], [address], [place], [place] [address]    Culture  Setup Time   Final    GRAM NEGATIVE RODS IN BOTH AEROBIC AND ANAEROBIC BOTTLES CRITICAL RESULT CALLED TO, READ BACK BY AND VERIFIED WITH: PHARMD E. JACKSON 12/21/22 @ 0645 BY AB Performed at [facility], [address], [place], [place] [address]    Culture KLEBSIELLA PNEUMONIAE (A)  Final   Report Status 12/23/2022 FINAL  Final   Organism ID, Bacteria KLEBSIELLA PNEUMONIAE  Final      Susceptibility   Klebsiella pneumoniae - MIC*    AMPICILLIN >=32 RESISTANT Resistant     CEFEPIME <=0.12 SENSITIVE Sensitive     CEFTAZIDIME <=1 SENSITIVE Sensitive     CEFTRIAXONE <=0.25 SENSITIVE Sensitive     CIPROFLOXACIN <=0.25 SENSITIVE Sensitive     GENTAMICIN <=1 SENSITIVE Sensitive     IMIPENEM 0.5 SENSITIVE Sensitive     TRIMETH/SULFA <=20 SENSITIVE Sensitive     AMPICILLIN/SULBACTAM 4 SENSITIVE Sensitive     PIP/TAZO <=4 SENSITIVE Sensitive     * KLEBSIELLA PNEUMONIAE  Blood Culture ID Panel (Reflexed)     Status: Abnormal   Collection Time: 12/20/22  1:08 PM  Result Value Ref Range Status   Enterococcus faecalis NOT DETECTED NOT DETECTED Final   Enterococcus Faecium NOT DETECTED NOT DETECTED Final   Listeria monocytogenes NOT DETECTED NOT DETECTED Final   Staphylococcus species NOT DETECTED NOT DETECTED Final   Staphylococcus aureus (BCID) NOT DETECTED NOT DETECTED Final   Staphylococcus  epidermidis NOT DETECTED NOT DETECTED Final   Staphylococcus lugdunensis NOT DETECTED NOT DETECTED Final   Streptococcus species NOT DETECTED NOT DETECTED Final   Streptococcus agalactiae NOT DETECTED NOT DETECTED Final   Streptococcus pneumoniae NOT DETECTED NOT DETECTED Final   Streptococcus pyogenes NOT DETECTED NOT DETECTED Final   A.calcoaceticus-baumannii NOT DETECTED NOT DETECTED Final   Bacteroides fragilis NOT DETECTED NOT DETECTED Final   Enterobacterales DETECTED (A) NOT DETECTED Final    Comment: Enterobacterales represent a large order of gram negative bacteria, not a single organism. CRITICAL RESULT CALLED TO, READ BACK BY AND VERIFIED WITH: PHARMD E. JACKSON 12/21/22 @ 0645 BY AB    Enterobacter cloacae complex NOT DETECTED NOT DETECTED Final   Escherichia coli NOT DETECTED NOT DETECTED Final   Klebsiella aerogenes NOT DETECTED NOT DETECTED Final   Klebsiella oxytoca NOT DETECTED NOT DETECTED Final   Klebsiella pneumoniae DETECTED (A) NOT DETECTED Final    Comment: CRITICAL RESULT CALLED TO, READ BACK BY AND VERIFIED WITH: PHARMD E. JACKSON 12/21/22 @ 0645 BY AB    Proteus species NOT DETECTED NOT DETECTED Final   Salmonella species NOT DETECTED NOT DETECTED Final   Serratia marcescens NOT DETECTED NOT DETECTED Final   Haemophilus influenzae NOT DETECTED NOT DETECTED Final   Neisseria meningitidis NOT DETECTED NOT DETECTED Final   Pseudomonas aeruginosa NOT DETECTED NOT DETECTED Final   Stenotrophomonas maltophilia NOT DETECTED NOT DETECTED Final   Candida albicans NOT  DETECTED NOT DETECTED Final   Candida auris NOT DETECTED NOT DETECTED Final   Candida glabrata NOT DETECTED NOT DETECTED Final   Candida krusei NOT DETECTED NOT DETECTED Final   Candida parapsilosis NOT DETECTED NOT DETECTED Final   Candida tropicalis NOT DETECTED NOT DETECTED Final   Cryptococcus neoformans/gattii NOT DETECTED NOT DETECTED Final   CTX-M ESBL NOT DETECTED NOT DETECTED Final   Carbapenem  resistance IMP NOT DETECTED NOT DETECTED Final   Carbapenem resistance KPC NOT DETECTED NOT DETECTED Final   Carbapenem resistance NDM NOT DETECTED NOT DETECTED Final   Carbapenem resist OXA 48 LIKE NOT DETECTED NOT DETECTED Final   Carbapenem resistance VIM NOT DETECTED NOT DETECTED Final    Comment: Performed at [facility], [address], [place], [place] [address]  Blood Culture (routine x 2)     Status: None (Preliminary result)   Collection Time: 12/20/22  7:33 PM   Specimen: BLOOD RIGHT ARM  Result Value Ref Range Status   Specimen Description BLOOD RIGHT ARM  Final   Special Requests   Final    BOTTLES DRAWN AEROBIC AND ANAEROBIC Blood Culture adequate volume   Culture   Final    NO GROWTH 4 DAYS Performed at [facility], [address] [place], [place] [address]    Report Status PENDING  Incomplete  C Difficile Quick Screen w PCR reflex     Status: None   Collection Time: 12/21/22  5:51 PM   Specimen: Stool  Result Value Ref Range Status   C Diff antigen NEGATIVE NEGATIVE Final   C Diff toxin NEGATIVE NEGATIVE Final   C Diff interpretation No C. difficile detected.  Final    Comment: Performed at [facility], [address] [place], [place] [address]  Gastrointestinal Panel by PCR , Stool     Status: None   Collection Time: 12/21/22  5:51 PM   Specimen: Stool  Result Value Ref Range Status   Campylobacter species NOT DETECTED NOT DETECTED Final   Plesimonas shigelloides NOT DETECTED NOT DETECTED Final   Salmonella species NOT DETECTED NOT DETECTED Final   Yersinia enterocolitica NOT DETECTED NOT DETECTED Final   Vibrio species NOT DETECTED NOT DETECTED Final   Vibrio cholerae NOT DETECTED NOT DETECTED Final   Enteroaggregative E coli (EAEC) NOT DETECTED NOT DETECTED Final   Enteropathogenic E coli (EPEC) NOT DETECTED NOT DETECTED Final   Enterotoxigenic E coli (ETEC) NOT DETECTED NOT DETECTED Final   Shiga like toxin producing E  coli (STEC) NOT DETECTED NOT DETECTED Final   Shigella/Enteroinvasive E coli (EIEC) NOT DETECTED NOT DETECTED Final   Cryptosporidium NOT DETECTED NOT DETECTED Final   Cyclospora cayetanensis NOT DETECTED NOT DETECTED Final   Entamoeba histolytica NOT DETECTED NOT DETECTED Final   Giardia lamblia NOT DETECTED NOT DETECTED Final   Adenovirus F40/41 NOT DETECTED NOT DETECTED Final   Astrovirus NOT DETECTED NOT DETECTED Final   Norovirus GI/GII NOT DETECTED NOT DETECTED Final   Rotavirus A NOT DETECTED NOT DETECTED Final   Sapovirus (I, II, IV, and V) NOT DETECTED NOT DETECTED Final    Comment: Performed at [facility], [address], [place], [place] [address]     Labs: Basic Metabolic Panel: Recent Labs  Lab 12/20/22 1308 12/21/22 0344 12/23/22 0428 12/24/22 0434  NA 131* 134* 137 138  K 4.4 3.8 3.9 3.6  CL 94* 102 105 104  CO2 24 25 26 26   GLUCOSE 201* 157*  106* 136*  BUN 23 26* 16 14  CREATININE 1.23* 1.02* 0.78 0.70  CALCIUM 9.0 7.8* 7.9* 8.0*  MG  --   --  1.8 1.8  PHOS  --   --  2.7 2.2*   Liver Function Tests: Recent Labs  Lab 12/20/22 1308 12/21/22 0344 12/23/22 0428 12/24/22 0434  AST 65* 45* 57* 67*  ALT 86* 58* 51* 55*  ALKPHOS 113 78 104 117  BILITOT 3.7* 2.2* 2.2* 2.2*  PROT 7.9 6.2* 6.2* 6.1*  ALBUMIN 3.7 2.7* 2.6* 2.5*   CBC: Recent Labs  Lab 12/20/22 1308 12/21/22 0344 12/23/22 0428 12/24/22 0434  WBC 14.6* 9.5 5.3 7.1  NEUTROABS 13.8* 8.3* 3.4 5.1  HGB 15.8* 13.0 13.1 12.9  HCT 47.9* 40.5 39.5 39.7  MCV 88.5 90.0 89.0 88.2  PLT 215 165 198 207   CBG: Recent Labs  Lab 12/23/22 1219 12/23/22 1646 12/23/22 2110 12/24/22 0816 12/24/22 1140  GLUCAP 116* 80 150* 112* 149*   Hgb A1c No results for input(s): "HGBA1C" in the last 72 hours. Lipid Profile No results for input(s): "CHOL", "HDL", "LDLCALC", "TRIG", "CHOLHDL", "LDLDIRECT" in the last 72 hours. Thyroid function studies No results for input(s): "TSH", "T4TOTAL",  "T3FREE", "THYROIDAB" in the last 72 hours.  Invalid input(s): "FREET3" Urinalysis    Component Value Date/Time   COLORURINE YELLOW 12/20/2022 1859   APPEARANCEUR CLEAR 12/20/2022 1859   LABSPEC 1.010 12/20/2022 1859   PHURINE 5.5 12/20/2022 1859   GLUCOSEU NEGATIVE 12/20/2022 1859   HGBUR NEGATIVE 12/20/2022 1859   BILIRUBINUR SMALL (A) 12/20/2022 1859   KETONESUR NEGATIVE 12/20/2022 1859   PROTEINUR NEGATIVE 12/20/2022 1859   UROBILINOGEN 1.0 10/18/2009 1052   NITRITE NEGATIVE 12/20/2022 1859   LEUKOCYTESUR NEGATIVE 12/20/2022 1859    FURTHER DISCHARGE INSTRUCTIONS:   Get Medicines reviewed and adjusted: Please take all your medications with you for your next visit with your Primary MD   Laboratory/radiological data: Please request your Primary MD to go over all hospital tests and procedure/radiological results at the follow up, please ask your Primary MD to get all Hospital records sent to his/her office.   In some cases, they will be blood work, cultures and biopsy results pending at the time of your discharge. Please request that your primary care M.D. goes through all the records of your hospital data and follows up on these results.   Also Note the following: If you experience worsening of your admission symptoms, develop shortness of breath, life threatening emergency, suicidal or homicidal thoughts you must seek medical attention immediately by calling 911 or calling your MD immediately  if symptoms less severe.   You must read complete instructions/literature along with all the possible adverse reactions/side effects for all the Medicines you take and that have been prescribed to you. Take any new Medicines after you have completely understood and accpet all the possible adverse reactions/side effects.    Do not drive when taking Pain medications or sleeping medications (Benzodaizepines)   Do not take more than prescribed Pain, Sleep and Anxiety Medications. It is not  advisable to combine anxiety,sleep and pain medications without talking with your primary care practitioner   Special Instructions: If you have smoked or chewed Tobacco  in the last 2 yrs please stop smoking, stop any regular Alcohol  and or any Recreational drug use.   Wear Seat belts while driving.   Please note: You were cared for by a hospitalist during your hospital stay. Once you are discharged, your primary  care physician will handle any further medical issues. Please note that NO REFILLS for any discharge medications will be authorized once you are discharged, as it is imperative that you return to your primary care physician (or establish a relationship with a primary care physician if you do not have one) for your post hospital discharge needs so that they can reassess your need for medications and monitor your lab values.  Time coordinating discharge: 35 minutes  SIGNED:  Pamella Pert, MD, PhD 12/24/2022, 1:36 PM

## 2022-12-25 ENCOUNTER — Telehealth: Payer: Self-pay

## 2022-12-25 ENCOUNTER — Telehealth: Payer: Self-pay | Admitting: Hematology

## 2022-12-25 LAB — CULTURE, BLOOD (ROUTINE X 2)

## 2022-12-25 NOTE — Transitions of Care (Post Inpatient/ED Visit) (Signed)
12/25/2022  Name: Regina Mckay MRN: [id number] DOB: [birth year]  Today's TOC FU Call Status: Today's TOC FU Call Status:: Successful TOC FU Call Competed TOC FU Call Complete Date: 12/25/22  Transition Care Management Follow-up Telephone Call Date of Discharge: 12/24/22 Discharge Facility: [facility]) Type of Discharge: Inpatient Admission Primary Inpatient Discharge Diagnosis:: sepsis Reason for ED Visit: Other: How have you been since you were released from the hospital?: Better Any questions or concerns?: No  Items Reviewed: Did you receive and understand the discharge instructions provided?: Yes Medications obtained,verified, and reconciled?: Yes (Medications Reviewed) Any new allergies since your discharge?: No Dietary orders reviewed?: NA Do you have support at home?: Yes People in Home: grandchild(ren)  Medications Reviewed Today: Medications Reviewed Today     Reviewed by Annabell Sabal, CMA (Certified Medical Assistant) on 12/25/22 at 0919  Med List Status: <None>   Medication Order Taking? Sig Documenting Provider Last Dose Status Informant  acetaminophen (TYLENOL) 500 MG tablet [id number] Yes Take 500 mg by mouth every 4 (four) hours as needed for mild pain or moderate pain. [provider] Taking Active Self  amLODipine (NORVASC) 10 MG tablet [id number] Yes Take 1 tablet (10 mg total) by mouth daily. Follow-up appt due in July must see provider for future refills [name], NP Taking Active Self  amoxicillin-clavulanate (AUGMENTIN) 875-125 MG tablet [id number] Yes Take 1 tablet by mouth 2 (two) times daily for 14 days. Leatha Gilding, MD Taking Active   bismuth subsalicylate (PEPTO BISMOL) 262 MG chewable tablet [id number] Yes Chew 524 mg by mouth as needed for indigestion or diarrhea or loose stools. [provider] Taking Active Self  cholecalciferol (VITAMIN D3) 10 MCG (400 UNIT) TABS tablet [id number] Yes Take 400 Units by mouth daily.  [provider] Taking Active Self  Continuous Blood Gluc Receiver (FREESTYLE LIBRE 2 READER) DEVI [id number] Yes 1 each by Does not apply route daily. Carlus Pavlov, MD Taking Active Self  Continuous Blood Gluc Sensor (FREESTYLE LIBRE 2 SENSOR) [place] [id number] Yes USE 1 SENSOR EVERY 14 DAYS AS DIRECTED  Patient taking differently: 1 each by Other route See admin instructions. USE 1 SENSOR EVERY 14 DAYS AS DIRECTED   Carlus Pavlov, MD Taking Active Self  Dextrose, Diabetic Use, (GLUCOSE PO) [id number] Yes Take 1 tablet by mouth See admin instructions. Patient uses Glucose Tablets with 15 grams of carbohydrates. [provider] Taking Active Self  hydrochlorothiazide (MICROZIDE) 12.5 MG capsule [id number] Yes Take 1 capsule (12.5 mg total) by mouth daily. [name], NP Taking Active Self  insulin aspart (NOVOLOG FLEXPEN) 100 UNIT/ML FlexPen [id number] Yes Inject 5-7 units under skin up to 2x a day  Patient taking differently: Inject 7-9 Units into the skin See admin instructions. Inject 5-7 units under skin up to 2x a day   Carlus Pavlov, MD Taking Active Self  Insulin Glargine [facility] KWIKPEN) 100 UNIT/ML [id number] Yes Inject 22 Units into the skin daily. Carlus Pavlov, MD Taking Active Self  Insulin Pen Needle 32G X 4 MM MISC [id number] Yes Use 4x a day  Patient taking differently: 1 each by Other route See admin instructions. Use 4x a day   Carlus Pavlov, MD Taking Active Self  lidocaine (LIDODERM) 5 % [id number] Yes PLACE 1 PATCH ONTO THE SKIN DAILY. REMOVE& DISCARD PATCH WITHIN 12 HOURS Kathryne Hitch, MD Taking Active Self  lisinopril (ZESTRIL) 20 MG tablet [id number] Yes Take 1 tablet (20 mg total) by mouth daily. Follow-up appt due  in July [name], NP Taking Active Self  oxyCODONE (OXY IR/ROXICODONE) 5 MG immediate release tablet [id number] Yes Take 0.5-1 tablets (2.5-5 mg total) by mouth every 4 (four) hours as needed for moderate pain.  Leatha Gilding, MD Taking Active   pravastatin (PRAVACHOL) 40 MG tablet [id number] Yes Take 1 tablet (40 mg total) by mouth daily. [name], NP Taking Active Self  Semaglutide,0.25 or 0.5MG /DOS, 2 MG/3ML Namon Cirri [id number] Yes Inject 0.25-0.5 mg into the skin once a week. Carlus Pavlov, MD Taking Active Self  Med List Note Marsa Aris 10/09/20 1554): C, zinc, b12 tylenol, pain/headace            Home Care and Equipment/Supplies: Were Home Health Services Ordered?: No Any new equipment or medical supplies ordered?: No  Functional Questionnaire: Do you need assistance with bathing/showering or dressing?: No Do you need assistance with meal preparation?: No Do you need assistance with eating?: No Do you have difficulty maintaining continence: No Do you need assistance with getting out of bed/getting out of a chair/moving?: No Do you have difficulty managing or taking your medications?: No  Follow up appointments reviewed: PCP Follow-up appointment confirmed?: Yes Date of PCP follow-up appointment?: 12/26/22 Follow-up Provider: Renda Rolls Specialist [facility] Follow-up appointment confirmed?: No Reason Specialist Follow-Up Not Confirmed: Patient has Specialist Provider Number and will Call for Appointment Do you need transportation to your follow-up appointment?: No Do you understand care options if your condition(s) worsen?: Yes-patient verbalized understanding    SIGNATURE Dhruv Christina,CMA [facility] , [facility]

## 2022-12-25 NOTE — Telephone Encounter (Signed)
scheduled per referral, pt has been called and confirmed date and time. Pt is aware of location and to arrive early for check in   

## 2022-12-25 NOTE — Consult Note (Signed)
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  CT chest with contrast showed at least 5 pulmonary nodules recommended CT in 3 months, mediastinal lymph node neuropathy concerning for nodal metastasis..  Infectious disease engaged as etiology of bacteremia unclear - T. bili elevated as such MRCP recommended which showed liver with lesions concerning for metastatic disease, bulky lymphadenopathy, right portal vein markedly attenuated with patency cannot be confirmed cholelithiasis with apparent gallbladder wall thickening and marrow edema acute cholecystitis not excluded - I suspect bacteremia is likely intra-abdominal source, possible liver mass with infection given attenuated right portal vein. Recommendations:  -DC ceftriaxone  - Start Augmentin - I will see her in clinic on 6/3, final antibiotic course pending biopsy results.  I suspect Klebsiella bacteremia secondary to GI source of like the biopsy to see if patient needs to be treated for the last 2 weeks and assess of liver mass may be infected. -She was given close follow-up with oncology clinic given concern for malignancy.  Noted the patient's MRI colon cancer  screening.   #Penicillin allergy. - Patient reports her heart was racing after she was given amoxicillin in her 58s, no shortness of breath.  She thinks she has had Augmentin since and has tolerated it. - Patient passed amoxicillin challenge - Augmentin as above Microbiology:   Antibiotics: Levaquin 5/18 Metronidazole 5/18-present Aztreonam 5/19-20 Ceftriaxone 5/21-present   Cultures: Blood 5/18 1/2 Klebsiella pneumonia  HPI: Regina Mckay is a 72 y.o. female with history of obesity, hypertension, diabetes, Ozempic presented with nausea, vomiting, diarrhea 2 days prior to presentation.  Found to hav eKlebsiella pneumonia bacteremia transition to ceftriaxone given penicillin allergy.  Source of bacteremia was unclear.  CT abdomen pelvis showed multiple low-density lesions throughout liver and abdominal retroperitoneal lymphadenopathy concern for metastatic disease/malignancy, CT chest showed 5 solid pulmonary nodule concern for notes mets MRCP recommended due to the elevated T. bili which did not show choledocholithiasis although showed liver lesions concerning for metastatic disease, bulky lymphadenopathy, right portal vein.  Markedly attenuated and patency cannot be confirmed, radiology engaged for biopsy of liver lesions   Review of Systems: Review of Systems  All other systems reviewed and are negative.   Past Medical History:  Diagnosis Date   Diabetes mellitus without complication (HCC)    Hypertension     Social History   Tobacco Use   Smoking status: Former    Types: Cigarettes   Smokeless tobacco: Former    Quit date: 1970  Substance Use Topics   Alcohol use: Not Currently   Drug use: No    Family History  Problem Relation Age of Onset   Alcohol abuse Mother    Arthritis Mother    Stroke Mother    Alcohol abuse Father  Arthritis Father    Alcohol abuse Sister    Diabetes Sister    Alcohol abuse Sister    Arthritis Sister    Alcohol abuse Brother     Alcohol abuse Brother    Alcohol abuse Brother    Diabetes Brother    Alcohol abuse Daughter    Scheduled Meds: Continuous Infusions: PRN Meds:. No Known Allergies  OBJECTIVE: Blood pressure (!) 163/70, pulse 73, temperature 98.4 F (36.9 C), temperature source Oral, resp. rate 19, height 5\' 3"  (1.6 m), weight 108.9 kg, SpO2 100 %.  Physical Exam Constitutional:      Appearance: Normal appearance.  HENT:     Head: Normocephalic and atraumatic.     Right Ear: Tympanic membrane normal.     Left Ear: Tympanic membrane normal.     Nose: Nose normal.     Mouth/Throat:     Mouth: Mucous membranes are moist.  Eyes:     Extraocular Movements: Extraocular movements intact.     Conjunctiva/sclera: Conjunctivae normal.     Pupils: Pupils are equal, round, and reactive to light.  Cardiovascular:     Rate and Rhythm: Normal rate and regular rhythm.     Heart sounds: No murmur heard.    No friction rub. No gallop.  Pulmonary:     Effort: Pulmonary effort is normal.     Breath sounds: Normal breath sounds.  Abdominal:     General: Abdomen is flat.     Palpations: Abdomen is soft.  Musculoskeletal:        General: Normal range of motion.  Skin:    General: Skin is warm and dry.  Neurological:     General: No focal deficit present.     Mental Status: She is alert and oriented to person, place, and time.  Psychiatric:        Mood and Affect: Mood normal.     Lab Results Lab Results  Component Value Date   WBC 7.1 12/24/2022   HGB 12.9 12/24/2022   HCT 39.7 12/24/2022   MCV 88.2 12/24/2022   PLT 207 12/24/2022    Lab Results  Component Value Date   CREATININE 0.70 12/24/2022   BUN 14 12/24/2022   NA 138 12/24/2022   K 3.6 12/24/2022   CL 104 12/24/2022   CO2 26 12/24/2022    Lab Results  Component Value Date   ALT 55 (H) 12/24/2022   AST 67 (H) 12/24/2022   ALKPHOS 117 12/24/2022   BILITOT 2.2 (H) 12/24/2022       Danelle Earthly, MD [facility] 12/25/2022, 5:04 AM   I have personally spent 85 minutes involved in face-to-face and non-face-to-face activities for this patient on the day of the visit. Professional time spent includes the following activities: Preparing to see the patient (review of tests), Obtaining and/or reviewing separately obtained history (admission/discharge record), Performing a medically appropriate examination and/or evaluation , Ordering medications/tests/procedures, referring and communicating with other health care professionals, Documenting clinical information in the EMR, Independently interpreting results (not separately reported), Communicating results to the patient/family/caregiver, Counseling and educating the patient/family/caregiver and Care coordination (not separately reported).

## 2022-12-25 NOTE — Telephone Encounter (Signed)
Spoke with patient, she took her Augmentin this morning and then began throwing up. Denies any other symptoms, nausea and vomiting started today. Will route to provider.   Sandie Ano, RN

## 2022-12-26 ENCOUNTER — Encounter: Payer: Self-pay | Admitting: Nurse Practitioner

## 2022-12-26 ENCOUNTER — Other Ambulatory Visit: Payer: Self-pay

## 2022-12-26 ENCOUNTER — Ambulatory Visit: Payer: Federal, State, Local not specified - PPO | Admitting: Nurse Practitioner

## 2022-12-26 VITALS — BP 138/76 | HR 86 | Temp 98.3°F | Ht 63.0 in | Wt 227.4 lb

## 2022-12-26 DIAGNOSIS — R110 Nausea: Secondary | ICD-10-CM | POA: Diagnosis not present

## 2022-12-26 DIAGNOSIS — A419 Sepsis, unspecified organism: Secondary | ICD-10-CM | POA: Diagnosis not present

## 2022-12-26 DIAGNOSIS — E041 Nontoxic single thyroid nodule: Secondary | ICD-10-CM

## 2022-12-26 DIAGNOSIS — K769 Liver disease, unspecified: Secondary | ICD-10-CM

## 2022-12-26 MED ORDER — ONDANSETRON HCL 4 MG PO TABS
4.0000 mg | ORAL_TABLET | Freq: Three times a day (TID) | ORAL | 2 refills | Status: DC | PRN
Start: 2022-12-26 — End: 2023-01-02

## 2022-12-26 NOTE — Patient Instructions (Addendum)
Probiotic - Ask Dr. Thedore Mins about this for the diarrhea

## 2022-12-26 NOTE — Assessment & Plan Note (Signed)
Acute, improving Still there intermittently.  I think it is reasonable to prescribe ondansetron that she can take as needed for nausea.  Prescription sent to pharmacy.

## 2022-12-26 NOTE — Assessment & Plan Note (Signed)
Noted on discharge summary. I do not see where ultrasounds been ordered as of yet, I will go ahead and order ultrasound of thyroid for further evaluation.

## 2022-12-26 NOTE — Progress Notes (Signed)
Established Patient Office Visit  Subjective   Patient ID: Regina Mckay, female    DOB: 19-Sep-1950  Age: 72 y.o. MRN: [id number]  Chief Complaint  Patient presents with   Medical Management of Chronic Issues    Hospital follow up, no concern or issue     Patient arrives for hospital follow-up.  She is accompanied by her sister and granddaughter.   She was admitted 12/20/2022 and discharged 12/24/2022.  She presented to the hospital with complaints of nausea, vomiting, and diarrhea.  She had been taking Pepto-Bismol at home but symptoms worsened and she ultimately spiked a fever.    Evaluation identified sepsis, unclear source but she was growing Klebsiella and blood cultures.  She underwent MRCP as it was thought there was a GI etiology due to elevated bilirubin.  No gallstones were noted.  Surgery was consulted and recommended against HIDA scan as she was not having pain in her right upper quadrant so they felt that clinical suspicion for cholecystitis was low.  Urinalysis was negative for evidence of UTI.  ID was consulted and they started patient on a 14-day course of Augmentin, she has an appointment scheduled up with them 01/05/2023.  Multiple lesions throughout the liver, abdomen, and lungs were identified and are concerning for possible metastatic malignancy with of unknown origin.  She is scheduled follow-up with oncology next week.  Her discharge summary also identifies right sided thyroid nodule.  Recommend thyroid ultrasound in outpatient setting, I do not see where this is yet been ordered.  She has not started her Ozempic back since discharge in the hospital due to still having some mild nausea.  Last A1c 5.8.  She does follow with endocrinology.  Normally takes low-dose of Ozempic either 0.25 or 0.5 mg/week.  Today, she reports she is feeling much better but does have some mild nausea that still occurring.    Review of Systems  Gastrointestinal:  Positive for diarrhea and  nausea. Negative for abdominal pain, constipation and vomiting.  : see HPI    Objective:     BP 138/76   Pulse 86   Temp 98.3 F (36.8 C) (Temporal)   Ht 5\' 3"  (1.6 m)   Wt 227 lb 6 oz (103.1 kg)   SpO2 99%   BMI 40.28 kg/m  BP Readings from Last 3 Encounters:  12/26/22 138/76  12/24/22 (!) 163/70  11/06/22 132/80   Wt Readings from Last 3 Encounters:  12/26/22 227 lb 6 oz (103.1 kg)  12/20/22 240 lb (108.9 kg)  11/06/22 230 lb 9.6 oz (104.6 kg)      Physical Exam Vitals reviewed.  Constitutional:      General: She is not in acute distress.    Appearance: Normal appearance.  HENT:     Head: Normocephalic and atraumatic.  Neck:     Vascular: No carotid bruit.  Cardiovascular:     Rate and Rhythm: Normal rate and regular rhythm.     Pulses: Normal pulses.     Heart sounds: Normal heart sounds.  Pulmonary:     Effort: Pulmonary effort is normal.     Breath sounds: Normal breath sounds.  Skin:    General: Skin is warm and dry.  Neurological:     General: No focal deficit present.     Mental Status: She is alert and oriented to person, place, and time.  Psychiatric:        Mood and Affect: Mood normal.  Behavior: Behavior normal.        Judgment: Judgment normal.      No results found for any visits on 12/26/22.    The ASCVD Risk score (Arnett DK, et al., 2019) failed to calculate for the following reasons:   The valid total cholesterol range is 130 to 320 mg/dL    Assessment & Plan:   Problem List Items Addressed This Visit       Endocrine   Thyroid nodule    Noted on discharge summary. I do not see where ultrasounds been ordered as of yet, I will go ahead and order ultrasound of thyroid for further evaluation.      Relevant Orders   US THYROID     Other   Sepsis (HCC)    Acute Resolved Patient continues to improve clinically. Vital signs stable today.  Patient to continue on Augmentin and to follow-up with infectious disease as  well as oncology as scheduled.      Nausea - Primary    Acute, improving Still there intermittently.  I think it is reasonable to prescribe ondansetron that she can take as needed for nausea.  Prescription sent to pharmacy.      Relevant Medications   ondansetron (ZOFRAN) 4 MG tablet    Return in about 6 weeks (around 02/06/2023) for F/U with Nesa Distel.    [name], NP

## 2022-12-26 NOTE — Assessment & Plan Note (Signed)
Acute Resolved Patient continues to improve clinically. Vital signs stable today.  Patient to continue on Augmentin and to follow-up with infectious disease as well as oncology as scheduled.

## 2022-12-30 ENCOUNTER — Encounter (HOSPITAL_COMMUNITY): Payer: Self-pay

## 2022-12-30 ENCOUNTER — Other Ambulatory Visit: Payer: Self-pay

## 2022-12-30 ENCOUNTER — Inpatient Hospital Stay (HOSPITAL_COMMUNITY): Payer: Medicare Other

## 2022-12-30 ENCOUNTER — Inpatient Hospital Stay: Payer: Federal, State, Local not specified - PPO | Admitting: Hematology

## 2022-12-30 ENCOUNTER — Inpatient Hospital Stay: Payer: Federal, State, Local not specified - PPO | Attending: Hematology

## 2022-12-30 ENCOUNTER — Encounter: Payer: Self-pay | Admitting: Hematology

## 2022-12-30 ENCOUNTER — Inpatient Hospital Stay (HOSPITAL_COMMUNITY)
Admission: AD | Admit: 2022-12-30 | Discharge: 2023-01-02 | DRG: 424 | Disposition: A | Discharge status: Home / Self Care | Payer: Medicare Other | Source: Ambulatory Visit | Attending: Internal Medicine | Admitting: Internal Medicine

## 2022-12-30 VITALS — BP 160/68 | HR 85 | Temp 98.8°F | Resp 16 | Wt <= 1120 oz

## 2022-12-30 DIAGNOSIS — R7989 Other specified abnormal findings of blood chemistry: Secondary | ICD-10-CM | POA: Diagnosis not present

## 2022-12-30 DIAGNOSIS — K8021 Calculus of gallbladder without cholecystitis with obstruction: Secondary | ICD-10-CM | POA: Diagnosis not present

## 2022-12-30 DIAGNOSIS — Z8261 Family history of arthritis: Secondary | ICD-10-CM

## 2022-12-30 DIAGNOSIS — K208 Other esophagitis without bleeding: Secondary | ICD-10-CM | POA: Diagnosis not present

## 2022-12-30 DIAGNOSIS — K769 Liver disease, unspecified: Secondary | ICD-10-CM | POA: Diagnosis present

## 2022-12-30 DIAGNOSIS — E113211 Type 2 diabetes mellitus with mild nonproliferative diabetic retinopathy with macular edema, right eye: Secondary | ICD-10-CM | POA: Diagnosis present

## 2022-12-30 DIAGNOSIS — R1011 Right upper quadrant pain: Secondary | ICD-10-CM

## 2022-12-30 DIAGNOSIS — R7881 Bacteremia: Secondary | ICD-10-CM | POA: Insufficient documentation

## 2022-12-30 DIAGNOSIS — K869 Disease of pancreas, unspecified: Secondary | ICD-10-CM | POA: Diagnosis present

## 2022-12-30 DIAGNOSIS — B1920 Unspecified viral hepatitis C without hepatic coma: Secondary | ICD-10-CM | POA: Diagnosis present

## 2022-12-30 DIAGNOSIS — Z6839 Body mass index (BMI) 39.0-39.9, adult: Secondary | ICD-10-CM

## 2022-12-30 DIAGNOSIS — Z823 Family history of stroke: Secondary | ICD-10-CM | POA: Diagnosis not present

## 2022-12-30 DIAGNOSIS — R194 Change in bowel habit: Secondary | ICD-10-CM | POA: Insufficient documentation

## 2022-12-30 DIAGNOSIS — E669 Obesity, unspecified: Secondary | ICD-10-CM | POA: Diagnosis not present

## 2022-12-30 DIAGNOSIS — Z87891 Personal history of nicotine dependence: Secondary | ICD-10-CM | POA: Diagnosis not present

## 2022-12-30 DIAGNOSIS — Z1152 Encounter for screening for COVID-19: Secondary | ICD-10-CM

## 2022-12-30 DIAGNOSIS — I10 Essential (primary) hypertension: Secondary | ICD-10-CM | POA: Diagnosis not present

## 2022-12-30 DIAGNOSIS — Z833 Family history of diabetes mellitus: Secondary | ICD-10-CM | POA: Diagnosis not present

## 2022-12-30 DIAGNOSIS — C801 Malignant (primary) neoplasm, unspecified: Secondary | ICD-10-CM | POA: Diagnosis not present

## 2022-12-30 DIAGNOSIS — K2960 Other gastritis without bleeding: Secondary | ICD-10-CM | POA: Diagnosis present

## 2022-12-30 DIAGNOSIS — E119 Type 2 diabetes mellitus without complications: Secondary | ICD-10-CM | POA: Insufficient documentation

## 2022-12-30 DIAGNOSIS — Z809 Family history of malignant neoplasm, unspecified: Secondary | ICD-10-CM

## 2022-12-30 DIAGNOSIS — R059 Cough, unspecified: Secondary | ICD-10-CM

## 2022-12-30 DIAGNOSIS — R918 Other nonspecific abnormal finding of lung field: Secondary | ICD-10-CM

## 2022-12-30 DIAGNOSIS — R932 Abnormal findings on diagnostic imaging of liver and biliary tract: Secondary | ICD-10-CM

## 2022-12-30 DIAGNOSIS — R112 Nausea with vomiting, unspecified: Secondary | ICD-10-CM | POA: Insufficient documentation

## 2022-12-30 DIAGNOSIS — B961 Klebsiella pneumoniae [K. pneumoniae] as the cause of diseases classified elsewhere: Secondary | ICD-10-CM | POA: Diagnosis not present

## 2022-12-30 DIAGNOSIS — K838 Other specified diseases of biliary tract: Secondary | ICD-10-CM | POA: Diagnosis not present

## 2022-12-30 DIAGNOSIS — Z811 Family history of alcohol abuse and dependence: Secondary | ICD-10-CM | POA: Diagnosis not present

## 2022-12-30 DIAGNOSIS — K2210 Ulcer of esophagus without bleeding: Secondary | ICD-10-CM | POA: Diagnosis present

## 2022-12-30 DIAGNOSIS — R590 Localized enlarged lymph nodes: Secondary | ICD-10-CM

## 2022-12-30 DIAGNOSIS — Z7985 Long-term (current) use of injectable non-insulin antidiabetic drugs: Secondary | ICD-10-CM | POA: Diagnosis not present

## 2022-12-30 DIAGNOSIS — I700 Atherosclerosis of aorta: Secondary | ICD-10-CM | POA: Insufficient documentation

## 2022-12-30 DIAGNOSIS — I899 Noninfective disorder of lymphatic vessels and lymph nodes, unspecified: Secondary | ICD-10-CM | POA: Diagnosis not present

## 2022-12-30 DIAGNOSIS — R197 Diarrhea, unspecified: Secondary | ICD-10-CM

## 2022-12-30 DIAGNOSIS — R17 Unspecified jaundice: Secondary | ICD-10-CM | POA: Diagnosis not present

## 2022-12-30 DIAGNOSIS — K269 Duodenal ulcer, unspecified as acute or chronic, without hemorrhage or perforation: Secondary | ICD-10-CM | POA: Diagnosis not present

## 2022-12-30 DIAGNOSIS — Z86011 Personal history of benign neoplasm of the brain: Secondary | ICD-10-CM

## 2022-12-30 DIAGNOSIS — K449 Diaphragmatic hernia without obstruction or gangrene: Secondary | ICD-10-CM | POA: Diagnosis present

## 2022-12-30 DIAGNOSIS — R110 Nausea: Secondary | ICD-10-CM

## 2022-12-30 DIAGNOSIS — K2950 Unspecified chronic gastritis without bleeding: Secondary | ICD-10-CM | POA: Diagnosis not present

## 2022-12-30 DIAGNOSIS — E041 Nontoxic single thyroid nodule: Secondary | ICD-10-CM | POA: Diagnosis present

## 2022-12-30 DIAGNOSIS — R160 Hepatomegaly, not elsewhere classified: Secondary | ICD-10-CM | POA: Insufficient documentation

## 2022-12-30 DIAGNOSIS — Z79899 Other long term (current) drug therapy: Secondary | ICD-10-CM

## 2022-12-30 DIAGNOSIS — Z794 Long term (current) use of insulin: Principal | ICD-10-CM

## 2022-12-30 DIAGNOSIS — Z888 Allergy status to other drugs, medicaments and biological substances status: Secondary | ICD-10-CM

## 2022-12-30 DIAGNOSIS — K859 Acute pancreatitis without necrosis or infection, unspecified: Secondary | ICD-10-CM | POA: Diagnosis not present

## 2022-12-30 LAB — CBC WITH DIFFERENTIAL (CANCER CENTER ONLY)
Abs Immature Granulocytes: 0.04 K/uL (ref 0.00–0.07)
Basophils Absolute: 0 K/uL (ref 0.0–0.1)
Basophils Relative: 0 %
Eosinophils Absolute: 0.1 K/uL (ref 0.0–0.5)
Eosinophils Relative: 1 %
HCT: 43.5 % (ref 36.0–46.0)
Hemoglobin: 14.7 g/dL (ref 12.0–15.0)
Immature Granulocytes: 0 %
Lymphocytes Relative: 11 %
Lymphs Abs: 1 K/uL (ref 0.7–4.0)
MCH: 29.2 pg (ref 26.0–34.0)
MCHC: 33.8 g/dL (ref 30.0–36.0)
MCV: 86.5 fL (ref 80.0–100.0)
Monocytes Absolute: 0.9 K/uL (ref 0.1–1.0)
Monocytes Relative: 10 %
Neutro Abs: 7.4 K/uL (ref 1.7–7.7)
Neutrophils Relative %: 78 %
Platelet Count: 295 K/uL (ref 150–400)
RBC: 5.03 MIL/uL (ref 3.87–5.11)
RDW: 14.2 % (ref 11.5–15.5)
WBC Count: 9.5 K/uL (ref 4.0–10.5)
nRBC: 0 % (ref 0.0–0.2)

## 2022-12-30 LAB — CMP (CANCER CENTER ONLY)
ALT: 62 U/L — ABNORMAL HIGH (ref 0–44)
AST: 74 U/L — ABNORMAL HIGH (ref 15–41)
Albumin: 3.5 g/dL (ref 3.5–5.0)
Alkaline Phosphatase: 165 U/L — ABNORMAL HIGH (ref 38–126)
Anion gap: 8 (ref 5–15)
BUN: 9 mg/dL (ref 8–23)
CO2: 33 mmol/L — ABNORMAL HIGH (ref 22–32)
Calcium: 8.7 mg/dL — ABNORMAL LOW (ref 8.9–10.3)
Chloride: 96 mmol/L — ABNORMAL LOW (ref 98–111)
Creatinine: 1.02 mg/dL — ABNORMAL HIGH (ref 0.44–1.00)
GFR, Estimated: 58 mL/min — ABNORMAL LOW
Glucose, Bld: 148 mg/dL — ABNORMAL HIGH (ref 70–99)
Potassium: 3.8 mmol/L (ref 3.5–5.1)
Sodium: 137 mmol/L (ref 135–145)
Total Bilirubin: 9 mg/dL (ref 0.3–1.2)
Total Protein: 7 g/dL (ref 6.5–8.1)

## 2022-12-30 LAB — SAMPLE TO BLOOD BANK

## 2022-12-30 LAB — GLUCOSE, CAPILLARY
Glucose-Capillary: 168 mg/dL — ABNORMAL HIGH (ref 70–99)
Glucose-Capillary: 172 mg/dL — ABNORMAL HIGH (ref 70–99)

## 2022-12-30 LAB — SURGICAL PATHOLOGY

## 2022-12-30 MED ORDER — INSULIN GLARGINE-YFGN 100 UNIT/ML ~~LOC~~ SOLN
10.0000 [IU] | Freq: Every day | SUBCUTANEOUS | Status: DC
  Administered 2022-12-31 – 2023-01-02 (×3): 10 [IU] via SUBCUTANEOUS
  Filled 2022-12-30 (×3): qty 0.1

## 2022-12-30 MED ORDER — SODIUM CHLORIDE 0.9 % IV SOLN
2.0000 g | Freq: Every day | INTRAVENOUS | Status: DC
  Administered 2022-12-30 – 2022-12-31 (×2): 2 g via INTRAVENOUS
  Filled 2022-12-30 (×3): qty 20

## 2022-12-30 MED ORDER — SODIUM CHLORIDE 0.9 % IV SOLN: INTRAVENOUS | Status: DC

## 2022-12-30 MED ORDER — SODIUM CHLORIDE 0.9% FLUSH
3.0000 mL | Freq: Two times a day (BID) | INTRAVENOUS | Status: DC
  Administered 2022-12-30 – 2023-01-02 (×5): 3 mL via INTRAVENOUS

## 2022-12-30 MED ORDER — OXYCODONE HCL 5 MG PO TABS: 2.5000 mg | ORAL_TABLET | ORAL | Status: DC | PRN

## 2022-12-30 MED ORDER — HEPARIN SODIUM (PORCINE) 5000 UNIT/ML IJ SOLN
5000.0000 [IU] | Freq: Three times a day (TID) | INTRAMUSCULAR | Status: DC
  Administered 2022-12-30 – 2023-01-01 (×4): 5000 [IU] via SUBCUTANEOUS
  Filled 2022-12-30 (×2): qty 1

## 2022-12-30 MED ORDER — LISINOPRIL 20 MG PO TABS
20.0000 mg | ORAL_TABLET | Freq: Every day | ORAL | Status: DC
  Administered 2022-12-31 – 2023-01-02 (×3): 20 mg via ORAL
  Filled 2022-12-30 (×3): qty 1

## 2022-12-30 MED ORDER — HYDROCHLOROTHIAZIDE 12.5 MG PO CAPS: 12.5000 mg | ORAL_CAPSULE | Freq: Every day | ORAL | Status: DC

## 2022-12-30 MED ORDER — INSULIN ASPART 100 UNIT/ML IJ SOLN
0.0000 [IU] | Freq: Three times a day (TID) | INTRAMUSCULAR | Status: DC
  Administered 2022-12-31: 3 [IU] via SUBCUTANEOUS
  Administered 2023-01-02: 2 [IU] via SUBCUTANEOUS

## 2022-12-30 MED ORDER — ONDANSETRON HCL 4 MG PO TABS: 4.0000 mg | ORAL_TABLET | Freq: Four times a day (QID) | ORAL | Status: DC | PRN

## 2022-12-30 MED ORDER — ONDANSETRON HCL 4 MG/2ML IJ SOLN
4.0000 mg | Freq: Four times a day (QID) | INTRAMUSCULAR | Status: DC | PRN
  Administered 2023-01-01: 4 mg via INTRAVENOUS

## 2022-12-30 MED ORDER — ALUM & MAG HYDROXIDE-SIMETH 200-200-20 MG/5ML PO SUSP
30.0000 mL | ORAL | Status: DC | PRN
  Administered 2022-12-30: 30 mL via ORAL
  Filled 2022-12-30: qty 30

## 2022-12-30 MED ORDER — AMLODIPINE BESYLATE 10 MG PO TABS
10.0000 mg | ORAL_TABLET | Freq: Every day | ORAL | Status: DC
  Administered 2022-12-31 – 2023-01-02 (×3): 10 mg via ORAL
  Filled 2022-12-30 (×3): qty 1

## 2022-12-30 MED ORDER — INSULIN ASPART 100 UNIT/ML IJ SOLN: 0.0000 [IU] | Freq: Every day | INTRAMUSCULAR | Status: DC

## 2022-12-30 MED ORDER — HYDRALAZINE HCL 20 MG/ML IJ SOLN: 10.0000 mg | INTRAMUSCULAR | Status: DC | PRN

## 2022-12-30 MED ORDER — LABETALOL HCL 5 MG/ML IV SOLN: 10.0000 mg | INTRAVENOUS | Status: DC | PRN

## 2022-12-30 NOTE — Assessment & Plan Note (Signed)
-   Hold HCTZ - Continue lisinopril and amlodipine - Labetalol or hydralazine as needed

## 2022-12-30 NOTE — Hospital Course (Signed)
Regina Mckay is a 72 y.o. female with h/o obesity, HTN, IDDM2 admitted to [facility] 5/18 - 5/22 with complaints of nausea, vomiting, diarrhea.  During that hospital stay, she was found to have Klebsiella positive blood cultures, and seen by GI due to elevated bilirubin.  She was discharged with 14-day course of Augmentin for bacteremia treatment.   She had imaging concerning for multiple lesions throughout the liver, abdomen, and lungs concerning for possible metastatic malignancy.   She had followup with oncology on 5/28 and was found to have further markedly elevated TB, 9, and was referred to the hospital per oncology.

## 2022-12-30 NOTE — H&P (Signed)
History and Physical    Regina Mckay  ZOX:[id number]  DOB: Nov 03, 1950  DOA: 12/30/2022  PCP: [name], NP Patient coming from: Direct admit from oncology   Chief Complaint: abnormal LFTs  HPI:  Regina Mckay is a 72 y.o. female with h/o obesity, HTN, IDDM2 admitted to [facility] 5/18 - 5/22 with complaints of nausea, vomiting, diarrhea.  During that hospital stay, she was found to have Klebsiella positive blood cultures, and seen by GI due to elevated bilirubin.  She was discharged with 14-day course of Augmentin for bacteremia treatment.   She had imaging concerning for multiple lesions throughout the liver, abdomen, and lungs concerning for possible metastatic malignancy.   She had followup with oncology on 5/28 and was found to have further markedly elevated TB, 9, and was referred to the hospital per oncology.   I have personally briefly reviewed patient's old medical records in [facility] and discussed patient with the ER provider when appropriate/indicated.  Assessment and Plan: * Elevated LFTs - Recent workup during last hospitalization.  Surgery recommended against HIDA scan due to low suspicion for acute cholecystitis.  She still remains asymptomatic with no abdominal pain complaints but has had some mild nausea and decreased appetite -Prior workup showed cholelithiasis with mild gallbladder wall thickening but no biliary dilatation nor any choledocholithiasis.  She had RUQ ultrasound and MRCP performed on 12/21/2022 and 12/22/2022 respectively -LFTs repeated at oncology visit today and are higher concerning for obstruction at this time. AST 74, ALT 62, AP 165, TB 9.  Other considered etiology would be liver involvement from underlying malignancy -Will go ahead and repeat MRCP at this time -Oncology note reviewed and GI consulted.  Since liver biopsy negative prior hospitalization, will see if patient candidate for ERCP with brush biopsies versus repeat liver biopsy -  CLD, NPO at [facility] in case of procedure tomorrow, otherwise resume diet - MRCP ordered  Bacteremia due to Klebsiella pneumoniae - suspected GI source; seen by ID last hospitalization as well. -Treated with Rocephin and transitioned to Augmentin to complete 14-day course.  Last dose of Augmentin was morning of 12/30/2022 -She has had some nausea and decreased appetite, therefore we will switch back to IV antibiotic for definitive administration -Outpatient follow up with ID was scheduled for 01/05/2023  Thyroid nodule - Noted on prior workup last hospitalization with recommendation for nonemergent ultrasound.  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, with long-term current use of insulin (HCC) - Continue home basal insulin but lower dose until diet further advanced - Continue SSI and CBG monitoring    Code Status:     Code Status: Full Code  DVT Prophylaxis:   heparin injection 5,000 Units Start: 12/30/22 2200   Anticipated disposition is to: Home  History: Past Medical History:  Diagnosis Date   Diabetes mellitus without complication (HCC)    Hypertension     Past Surgical History:  Procedure Laterality Date   APPLICATION OF CRANIAL NAVIGATION N/A 10/11/2020   Procedure: APPLICATION OF CRANIAL NAVIGATION;  Surgeon: Bedelia Person, MD;  Location: [facility] OR;  Service: Neurosurgery;  Laterality: N/A;   CRANIOTOMY N/A 10/11/2020   Procedure: CRANIOTOMY FOR RESECTION OF MENINGIOMA;  Surgeon:  Bedelia Person, MD;  Location: [facility] OR;  Service: Neurosurgery;  Laterality: N/A;   REPLACEMENT TOTAL KNEE     TONSILLECTOMY       reports that she has quit smoking. Her smoking use  included cigarettes. She has never used smokeless tobacco. She reports that she does not currently use alcohol. She reports that she does not use drugs.  No Known Allergies  Family History  Problem Relation Age of Onset   Alcohol abuse Mother    Arthritis Mother    Stroke Mother    Alcohol abuse Father    Arthritis Father    Alcohol abuse Sister    Diabetes Sister    Alcohol abuse Sister    Arthritis Sister    Alcohol abuse Brother    Alcohol abuse Brother    Alcohol abuse Brother    Diabetes Brother    Cancer Paternal Aunt        unknown type cancer   Alcohol abuse Daughter    Home Medications: Prior to Admission medications   Medication Sig Start Date End Date Taking? Authorizing Provider  acetaminophen (TYLENOL) 500 MG tablet Take 500 mg by mouth every 4 (four) hours as needed for mild pain or moderate pain.    [provider]  amLODipine (NORVASC) 10 MG tablet Take 1 tablet (10 mg total) by mouth daily. Follow-up appt due in July must see provider for future refills 11/06/22   [name], NP  amoxicillin-clavulanate (AUGMENTIN) 875-125 MG tablet Take 1 tablet by mouth 2 (two) times daily for 14 days. 12/24/22 01/07/23  Leatha Gilding, MD  bismuth subsalicylate (PEPTO BISMOL) 262 MG chewable tablet Chew 524 mg by mouth as needed for indigestion or diarrhea or loose stools.    [provider]  cholecalciferol (VITAMIN D3) 10 MCG (400 UNIT) TABS tablet Take 400 Units by mouth daily.    [provider]  Continuous Blood Gluc Receiver (FREESTYLE LIBRE 2 READER) DEVI 1 each by Does not apply route daily. 10/25/21   Carlus Pavlov, MD  Continuous Blood Gluc Sensor (FREESTYLE LIBRE 2 SENSOR) MISC USE 1 SENSOR EVERY 14 DAYS AS DIRECTED Patient taking differently: 1 each by Other route See admin instructions. USE 1 SENSOR EVERY 14 DAYS AS DIRECTED 09/30/22   Carlus Pavlov, MD  Dextrose, Diabetic Use, (GLUCOSE PO) Take 1 tablet by mouth See admin  instructions. Patient uses Glucose Tablets with 15 grams of carbohydrates.    [provider]  hydrochlorothiazide (MICROZIDE) 12.5 MG capsule Take 1 capsule (12.5 mg total) by mouth daily. 05/28/22   [name], NP  insulin aspart (NOVOLOG FLEXPEN) 100 UNIT/ML FlexPen Inject 5-7 units under skin up to 2x a day Patient taking differently: Inject 7-9 Units into the skin See admin instructions. Inject 5-7 units under skin up to 2x a day 05/06/22   Carlus Pavlov, MD  Insulin Glargine [facility]) 100 UNIT/ML Inject 22 Units into the skin daily. 05/06/22   Carlus Pavlov, MD  Insulin Pen Needle 32G X 4 MM MISC Use 4x a day Patient taking differently: 1 each by Other route See admin instructions. Use 4x a day 09/13/21   Carlus Pavlov, MD  lidocaine (LIDODERM) 5 % PLACE 1 PATCH ONTO THE SKIN DAILY. REMOVE& DISCARD PATCH WITHIN 12 HOURS 08/22/21   Kathryne Hitch, MD  lisinopril (ZESTRIL) 20 MG tablet Take 1 tablet (20 mg total) by mouth daily. Follow-up appt due in July 11/17/22   [name], NP  ondansetron (ZOFRAN) 4 MG tablet Take 1 tablet (4 mg total) by mouth every 8 (eight) hours as needed for nausea or vomiting. 12/26/22   [name], NP  oxyCODONE (OXY IR/ROXICODONE) 5 MG immediate release tablet Take 0.5-1 tablets (2.5-5 mg total) by mouth every 4 (four) hours as needed for moderate pain. 12/24/22   Leatha Gilding, MD  pravastatin (PRAVACHOL) 40 MG tablet Take 1 tablet (40 mg total) by mouth daily. 08/25/22   [name], NP  Semaglutide,0.25 or 0.5MG /DOS, 2 MG/3ML SOPN Inject 0.25-0.5 mg into the skin once a week. 11/06/22   Carlus Pavlov, MD    Review of Systems:  Review of Systems  Constitutional:  Positive for malaise/fatigue and weight loss. Negative for chills and fever.  HENT: Negative.    Eyes: Negative.   Respiratory: Negative.    Cardiovascular: Negative.   Gastrointestinal:  Positive for nausea. Negative for abdominal pain and vomiting.   Genitourinary: Negative.   Musculoskeletal: Negative.   Skin: Negative.   Neurological: Negative.   Endo/Heme/Allergies: Negative.   Psychiatric/Behavioral: Negative.      Physical Exam:  Vitals:   12/30/22 1500  Height: 5\' 3"  (1.6 m)   Physical Exam Constitutional:      General: She is not in acute distress.    Appearance: Normal appearance.  HENT:     Head: Normocephalic and atraumatic.     Mouth/Throat:     Mouth: Mucous membranes are moist.  Eyes:     Extraocular Movements: Extraocular movements intact.  Cardiovascular:     Rate and Rhythm: Normal rate and regular rhythm.  Pulmonary:     Effort: Pulmonary effort is normal. No respiratory distress.     Breath sounds: Normal breath sounds. No wheezing.  Abdominal:     General: Bowel sounds are normal. There is no distension.     Palpations: Abdomen is soft.     Tenderness: There is no abdominal tenderness.  Musculoskeletal:        General: Normal range of motion.     Cervical back: Normal range of motion and neck supple.  Skin:    General: Skin is warm and dry.  Neurological:     General: No focal deficit present.     Mental Status: She is alert.  Psychiatric:        Mood and Affect: Mood normal.        Behavior: Behavior normal.      Labs on Admission:  I have personally reviewed following labs and imaging studies Results for orders placed or performed in visit on 12/30/22 (from the past 24 hour(s))  Sample to Blood Bank     Status: None   Collection Time: 12/30/22 12:10 PM  Result Value Ref Range   Blood Bank Specimen SAMPLE AVAILABLE FOR TESTING    Sample Expiration      01/02/2023,2359 Performed at [facility], [address] [place], [place] [address]   CMP (Cancer Center only)     Status: Abnormal   Collection Time: 12/30/22 12:10 PM  Result Value Ref Range   Sodium 137 135 - 145 mmol/L   Potassium 3.8 3.5 - 5.1 mmol/L   Chloride 96 (L) 98 - 111 mmol/L   CO2 33 (H) 22 - 32  mmol/L   Glucose, Bld 148 (H) 70 - 99 mg/dL   BUN 9 8 - 23 mg/dL   Creatinine 5.78 (H) 4.69 - 1.00 mg/dL   Calcium 8.7 (L) 8.9 - 10.3 mg/dL  Total Protein 7.0 6.5 - 8.1 g/dL   Albumin 3.5 3.5 - 5.0 g/dL   AST 74 (H) 15 - 41 U/L   ALT 62 (H) 0 - 44 U/L   Alkaline Phosphatase 165 (H) 38 - 126 U/L   Total Bilirubin 9.0 (HH) 0.3 - 1.2 mg/dL   GFR, Estimated 58 (L) >60 mL/min   Anion gap 8 5 - 15  CBC with Differential (Cancer Center Only)     Status: None   Collection Time: 12/30/22 12:10 PM  Result Value Ref Range   WBC Count 9.5 4.0 - 10.5 K/uL   RBC 5.03 3.87 - 5.11 MIL/uL   Hemoglobin 14.7 12.0 - 15.0 g/dL   HCT 40.9 81.1 - 91.4 %   MCV 86.5 80.0 - 100.0 fL   MCH 29.2 26.0 - 34.0 pg   MCHC 33.8 30.0 - 36.0 g/dL   RDW 78.2 95.6 - 21.3 %   Platelet Count 295 150 - 400 K/uL   nRBC 0.0 0.0 - 0.2 %   Neutrophils Relative % 78 %   Neutro Abs 7.4 1.7 - 7.7 K/uL   Lymphocytes Relative 11 %   Lymphs Abs 1.0 0.7 - 4.0 K/uL   Monocytes Relative 10 %   Monocytes Absolute 0.9 0.1 - 1.0 K/uL   Eosinophils Relative 1 %   Eosinophils Absolute 0.1 0.0 - 0.5 K/uL   Basophils Relative 0 %   Basophils Absolute 0.0 0.0 - 0.1 K/uL   Immature Granulocytes 0 %   Abs Immature Granulocytes 0.04 0.00 - 0.07 K/uL     Radiological Exams on Admission: No results found. MR ABDOMEN MRCP W WO CONTAST    (Results Pending)  US THYROID    (Results Pending)    Consults called:  Oncology  GI    Lewie Chamber, MD [facility] 12/30/2022, 4:28 PM

## 2022-12-30 NOTE — Progress Notes (Unsigned)
I met with Regina Mckay and her family after her consultation with Dr Mosetta Putt.  I explained my role as a nurse navigator and provided my contact information.  All questions were answered.  She verbalized understanding.

## 2022-12-30 NOTE — Progress Notes (Signed)
Critical lab value reported: Tbili 9.0 today.  Dr. Mosetta Putt notified and pt will be admitted for hyperbilirubinemia.

## 2022-12-30 NOTE — Progress Notes (Signed)
[facility]   [phone] [phone]   Clinic New Consult Note   Patient Care Team: [name], NP as PCP - General (Nurse Practitioner) Malachy Mood, MD as Attending Physician (Hematology and Oncology)  Date of Service:  12/30/2022   CHIEF COMPLAINTS/PURPOSE OF CONSULTATION:  Liver lesion  REFERRING PHYSICIAN:  Powell,A Charlyn Minerva MD  ASSESSMENT & PLAN:  Regina Mckay is a 72 y.o.  female with a history of   1.  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  Her gallbladder appears diffusely thickened, with multiple small stones.  No other primary tumor seen on the images. -She underwent a liver biopsy, but the biopsy was negative for malignant cells.  We discussed that this could be false negative. -I personally reviewed the image and discussed the findings with patient and her family. -Lab reviewed, her total bilirubin is 9 today, it was 2-3 when she was in the hospital.  This is concerning for obstructive jaundice.  I recommend hospital admission for further workup, and possible ERCP and stent placement if image confirm Perry obstruction.  Biliary brushing and cytology could be obtained during the procedure, to see if we can make the diagnosis.  If not, will repeat liver biopsy by IR. -This appears to be metastatic disease, not curable, the prognosis and treatment depends on the primary tumor      PLAN:  -lab reviewed -totla bilirubin 9.0 -reviewed scan and Biopsy with pt -admission to hospital today due to elevated bilirubin for further work up, please obtain abdominal  ultrasound, and call GI if ultrasound confirms obstructive jaundice -I called [facility] hospitalist Dr. Erenest Blank who kindly agreed with admission  -I will f/u her in hospital    HISTORY OF PRESENTING ILLNESS:  Regina Mckay 72 y.o. female is a here because of liver lesion. The patient was referred by Skeet Simmer, MD. The patient presents to the clinic today accompanied by accompanied by family (sister and granddaughter).   Pt was recently hospitalized on Dec 20, 2022.  Pt state she had a fever and vomiting and diarrhea, over the weekend.  She was found to have Klebsiella bacteremia, and a CT scan showed multiple liver lesion, significant abdominal adenopathy and multiple small pulmonary nodules, concerning for malignancy.  She underwent liver biopsy by interventional radiology.  Pt state since she has left the hospital she is feeling fine. Pt state that if her sense of smell had gotten sensitive so she get nauseous. Pt has notice a change in her urine in color, but is back to normal color.Pt has lost at least 16 lbs in 1 month.  She Has a PMHx of.... Hypertension  Diabetes Mellitus Brain Surgery Maternal aunt cancer    Socially... Divorced 1 child retired    REVIEW OF SYSTEMS:    Constitutional: (+) fevers, chills or (-) abnormal night sweats Eyes:(-) Denies blurriness of vision, double vision or watery eyes Ears, nose, mouth, throat, and face: Denies mucositis or sore throat Respiratory: (+) cough, dyspnea or wheezes Cardiovascular: Denies palpitation, chest discomfort or lower extremity swelling Gastrointestinal: (+)nausea, heartburn or (+)change in bowel habits Skin: (-) Denies abnormal skin rashes Lymphatics: (-)  Denies new lymphadenopathy or easy bruising Neurological:(-) Denies numbness, tingling or new weaknesses Behavioral/Psych: (-) Mood is stable, no new changes  All other systems were reviewed with the patient and are negative.   MEDICAL HISTORY:  Past Medical  History:  Diagnosis Date   Diabetes mellitus without complication (HCC)    Hypertension     SURGICAL HISTORY: Past Surgical History:  Procedure Laterality Date   APPLICATION OF CRANIAL NAVIGATION N/A 10/11/2020   Procedure: APPLICATION OF CRANIAL NAVIGATION;  Surgeon: Bedelia Person, MD;  Location: [facility] OR;  Service: Neurosurgery;  Laterality: N/A;   CRANIOTOMY N/A 10/11/2020   Procedure: CRANIOTOMY FOR RESECTION OF MENINGIOMA;  Surgeon: Bedelia Person, MD;  Location: [facility] OR;  Service: Neurosurgery;  Laterality: N/A;   REPLACEMENT TOTAL KNEE     TONSILLECTOMY      SOCIAL HISTORY: Social History   Socioeconomic History   Marital status: Divorced    Spouse name: Not on file   Number of children: 1   Years of education: Not on file   Highest education level: Not on file  Occupational History   Occupation: retired   Occupation: Retired from [employer]  Tobacco Use   Smoking status: Former    Types: Cigarettes   Smokeless tobacco: Never  Substance and Sexual Activity   Alcohol use: Not Currently   Drug use: No   Sexual activity: Not Currently  Other Topics Concern   Not on file  Social History Narrative   Not on file   Social Determinants of Health   Financial Resource Strain: Not on file  Food Insecurity: No Food Insecurity (12/22/2022)   Hunger Vital Sign    Worried About Running Out of Food in the Last Year: Never true    Ran Out of Food in the Last Year: Never true  Transportation Needs: No Transportation Needs (12/22/2022)   PRAPARE - Administrator, Civil Service (Medical): No    Lack of Transportation (Non-Medical): No  Physical Activity: Not on file  Stress: Not on file  Social Connections: Not on file  Intimate Partner Violence: Not At Risk (12/22/2022)   Humiliation, Afraid, Rape, and Kick questionnaire    Fear of Current or Ex-Partner: No    Emotionally Abused: No    Physically Abused: No    Sexually Abused: No    FAMILY HISTORY: Family  History  Problem Relation Age of Onset   Alcohol abuse Mother    Arthritis Mother    Stroke Mother    Alcohol abuse Father    Arthritis Father    Alcohol abuse Sister    Diabetes Sister    Alcohol abuse Sister    Arthritis Sister    Alcohol abuse Brother    Alcohol abuse Brother    Alcohol abuse Brother    Diabetes Brother    Cancer Paternal Aunt        unknown type cancer   Alcohol abuse Daughter     ALLERGIES:  has No Known Allergies.  MEDICATIONS:  No current outpatient medications on file.   No current facility-administered medications for this visit.    PHYSICAL EXAMINATION: ECOG PERFORMANCE STATUS: 1 - Symptomatic but completely ambulatory  Vitals:   12/30/22 1250  BP: (!) 160/68  Pulse: 85  Resp: 16  Temp: 98.8 F (37.1 C)  SpO2: 98%   Filed Weights   12/30/22 1250  Weight: 22 lb 12.8 oz (10.3 kg)     GENERAL:alert, no distress and comfortable SKIN: skin  color normal, no rashes or significant lesions EYES: (+) yellow ,Conjunctiva are pink and non-injected, sclera clear LUNGS: (-)clear to auscultation and percussion with normal breathing effort HEART: (-) regular rate & rhythm and no murmurs and no lower extremity edema ABDOMEN:(-) abdomen soft, (-)non-tender and (-) normal bowel sounds   LABORATORY DATA:  I have reviewed the data as listed    Latest Ref Rng & Units 12/30/2022   12:10 PM 12/24/2022    4:34 AM 12/23/2022    4:28 AM  CBC  WBC 4.0 - 10.5 K/uL 9.5  7.1  5.3   Hemoglobin 12.0 - 15.0 g/dL 82.9  56.2  13.0   Hematocrit 36.0 - 46.0 % 43.5  39.7  39.5   Platelets 150 - 400 K/uL 295  207  198        Latest Ref Rng & Units 12/30/2022   12:10 PM 12/24/2022    4:34 AM 12/23/2022    4:28 AM  CMP  Glucose 70 - 99 mg/dL 865  784  696   BUN 8 - 23 mg/dL 9  14  16    Creatinine 0.44 - 1.00 mg/dL 2.95  2.84  1.32   Sodium 135 - 145 mmol/L 137  138  137   Potassium 3.5 - 5.1 mmol/L 3.8  3.6  3.9   Chloride 98 - 111 mmol/L 96  104  105   CO2  22 - 32 mmol/L 33  26  26   Calcium 8.9 - 10.3 mg/dL 8.7  8.0  7.9   Total Protein 6.5 - 8.1 g/dL 7.0  6.1  6.2   Total Bilirubin 0.3 - 1.2 mg/dL 9.0  2.2  2.2   Alkaline Phos 38 - 126 U/L 165  117  104   AST 15 - 41 U/L 74  67  57   ALT 0 - 44 U/L 62  55  51      RADIOGRAPHIC STUDIES: I have personally reviewed the radiological images as listed and agreed with the findings in the report. US BIOPSY (LIVER)  Result Date: 12/23/2022 INDICATION: [id number] Liver lesion [id number] EXAM: ULTRASOUND GUIDED LIVER MASS BIOPSY COMPARISON:  CT AP, 12/20/2022.  US Abdomen, 12/21/2022 MEDICATIONS: None ANESTHESIA/SEDATION: Moderate (conscious) sedation was employed during this procedure. A total of Versed 1.5 mg and Fentanyl 100 mcg was administered intravenously. Moderate Sedation Time: 23 minutes. The patient's level of consciousness and vital signs were monitored continuously by radiology nursing throughout the procedure under my direct supervision. COMPLICATIONS: None immediate. PROCEDURE: Informed written consent was obtained from the patient and/or patient's representative after a discussion of the risks, benefits and alternatives to treatment. The patient understands and consents the procedure. A timeout was performed prior to the initiation of the procedure. Ultrasound scanning was performed of the right upper abdominal quadrant demonstrates ill-defined RIGHT hepatic lobe liver mass The RIGHT hepatic lobe mass was selected for biopsy and the procedure was planned. The right upper abdominal quadrant was prepped and draped in the usual sterile fashion. The overlying soft tissues were anesthetized with 1% lidocaine with epinephrine. A 17 gauge, 6.8 cm co-axial needle was advanced into a peripheral aspect of the lesion. This was followed by 4 core biopsies with an 18 gauge core device under direct ultrasound guidance. The coaxial needle tract was embolized with a small amount of Gel-Foam slurry and superficial  hemostasis was obtained with manual compression. Post procedural scanning was negative for definitive area of hemorrhage or additional complication. A dressing was placed. The  patient tolerated the procedure well without immediate post procedural complication. IMPRESSION: Successful ultrasound guided core needle biopsy of liver mass. Roanna Banning, MD [facility] Electronically Signed   By: Roanna Banning M.D.   On: 12/23/2022 17:32   MR ABDOMEN MRCP W WO CONTAST  Result Date: 12/23/2022 CLINICAL DATA:  Right upper quadrant abdominal pain. Liver lesions on previous imaging. EXAM: MRI ABDOMEN WITHOUT AND WITH CONTRAST (INCLUDING MRCP) TECHNIQUE: Multiplanar multisequence MR imaging of the abdomen was performed both before and after the administration of intravenous contrast. Heavily T2-weighted images of the biliary and pancreatic ducts were obtained, and three-dimensional MRCP images were rendered by post processing. CONTRAST:  10mL GADAVIST GADOBUTROL 1 MMOL/ML IV SOLN COMPARISON:  Chest CT 12/21/2022.  Abdomen and pelvis CT 12/20/2022 FINDINGS: Lower chest: The pulmonary nodule seen on recent chest CT are not well demonstrated by MRI. Hepatobiliary: As noted on recent CT scan, 3 distinct lesions are noted in the liver. These are relatively subtle but do show some peripheral enhancement and evidence of restricted diffusion. Index lesion anterior hepatic dome measures 2.3 x 2.2 cm on T2 image 10 of series 7. Lesion more posterior in the hepatic dome measures 1.9 cm on the same image. Posterior right hepatic lobe lesion measures 14 mm on image 11 of series 7. Additional tiny hypoenhancing lesions seen in the liver parenchyma on previous CT are less well demonstrated by MRI. Intrahepatic bile ducts are somewhat prominent but there is no extrahepatic biliary duct dilatation. No evidence for choledocholithiasis. Gallbladder wall is diffusely thickened and  edematous. Tiny dependent gallstones (2-3 mm) are seen in the dependent gallbladder lumen (coronal T2 image 12 of series 6 and axial T2 image 23 of series 7) MRCP images are motion degraded but some element of mass-effect on the confluence of the left and right hepatic ducts cannot be excluded.). Pancreas: No focal mass lesion. No dilatation of the main duct. No intraparenchymal cyst. No peripancreatic edema. Spleen:  No splenomegaly. No focal mass lesion. Adrenals/Urinary Tract: No adrenal nodule or mass. Kidneys unremarkable. Stomach/Bowel: Stomach is unremarkable. No gastric wall thickening. No evidence of outlet obstruction. Duodenum is normally positioned as is the ligament of Treitz. No small bowel or colonic dilatation within the visualized abdomen. Vascular/Lymphatic: No abdominal aortic aneurysm. No abdominal aortic atherosclerotic calcification. Main portal vein is patent. Right portal vein appears markedly attenuated and patency cannot be confirmed. This is similar to recent CT scan. SMV and splenic vein are patent. Bulky lymphadenopathy in the hepatoduodenal ligament again noted. There is associated retroperitoneal lymphadenopathy. Other:  No substantial intraperitoneal free fluid. Musculoskeletal: No focal suspicious marrow enhancement within the visualized bony anatomy. IMPRESSION: 1. 3 distinct lesions are noted in the liver parenchyma. These are relatively subtle but do show some peripheral enhancement and evidence of restricted diffusion. Additional tiny hypoenhancing lesions seen in the liver parenchyma on previous CT are less well demonstrated by MRI. Imaging features raise concern for metastatic disease. 2. Bulky lymphadenopathy in the hepatoduodenal ligament again noted. There is associated retroperitoneal lymphadenopathy. 3. Right portal vein appears markedly attenuated and patency cannot be confirmed. This is similar to recent CT scan. Main portal vein is patent. SMV and splenic vein are  patent. 4. Cholelithiasis with apparent gallbladder wall thickening and mural edema. Acute cholecystitis not excluded. 5. MRCP images are motion degraded but there is no extrahepatic biliary duct dilatation. No evidence for choledocholithiasis. Some element of mass-effect on the confluence of the left and right  hepatic ducts cannot be excluded. 6. The pulmonary nodule seen on recent chest CT are not well demonstrated by MRI. Electronically Signed   By: Kennith Center M.D.   On: 12/23/2022 06:46   MR 3D Recon At Scanner  Result Date: 12/23/2022 CLINICAL DATA:  Right upper quadrant abdominal pain. Liver lesions on previous imaging. EXAM: MRI ABDOMEN WITHOUT AND WITH CONTRAST (INCLUDING MRCP) TECHNIQUE: Multiplanar multisequence MR imaging of the abdomen was performed both before and after the administration of intravenous contrast. Heavily T2-weighted images of the biliary and pancreatic ducts were obtained, and three-dimensional MRCP images were rendered by post processing. CONTRAST:  10mL GADAVIST GADOBUTROL 1 MMOL/ML IV SOLN COMPARISON:  Chest CT 12/21/2022.  Abdomen and pelvis CT 12/20/2022 FINDINGS: Lower chest: The pulmonary nodule seen on recent chest CT are not well demonstrated by MRI. Hepatobiliary: As noted on recent CT scan, 3 distinct lesions are noted in the liver. These are relatively subtle but do show some peripheral enhancement and evidence of restricted diffusion. Index lesion anterior hepatic dome measures 2.3 x 2.2 cm on T2 image 10 of series 7. Lesion more posterior in the hepatic dome measures 1.9 cm on the same image. Posterior right hepatic lobe lesion measures 14 mm on image 11 of series 7. Additional tiny hypoenhancing lesions seen in the liver parenchyma on previous CT are less well demonstrated by MRI. Intrahepatic bile ducts are somewhat prominent but there is no extrahepatic biliary duct dilatation. No evidence for choledocholithiasis. Gallbladder wall is diffusely thickened and  edematous. Tiny dependent gallstones (2-3 mm) are seen in the dependent gallbladder lumen (coronal T2 image 12 of series 6 and axial T2 image 23 of series 7) MRCP images are motion degraded but some element of mass-effect on the confluence of the left and right hepatic ducts cannot be excluded.). Pancreas: No focal mass lesion. No dilatation of the main duct. No intraparenchymal cyst. No peripancreatic edema. Spleen:  No splenomegaly. No focal mass lesion. Adrenals/Urinary Tract: No adrenal nodule or mass. Kidneys unremarkable. Stomach/Bowel: Stomach is unremarkable. No gastric wall thickening. No evidence of outlet obstruction. Duodenum is normally positioned as is the ligament of Treitz. No small bowel or colonic dilatation within the visualized abdomen. Vascular/Lymphatic: No abdominal aortic aneurysm. No abdominal aortic atherosclerotic calcification. Main portal vein is patent. Right portal vein appears markedly attenuated and patency cannot be confirmed. This is similar to recent CT scan. SMV and splenic vein are patent. Bulky lymphadenopathy in the hepatoduodenal ligament again noted. There is associated retroperitoneal lymphadenopathy. Other:  No substantial intraperitoneal free fluid. Musculoskeletal: No focal suspicious marrow enhancement within the visualized bony anatomy. IMPRESSION: 1. 3 distinct lesions are noted in the liver parenchyma. These are relatively subtle but do show some peripheral enhancement and evidence of restricted diffusion. Additional tiny hypoenhancing lesions seen in the liver parenchyma on previous CT are less well demonstrated by MRI. Imaging features raise concern for metastatic disease. 2. Bulky lymphadenopathy in the hepatoduodenal ligament again noted. There is associated retroperitoneal lymphadenopathy. 3. Right portal vein appears markedly attenuated and patency cannot be confirmed. This is similar to recent CT scan. Main portal vein is patent. SMV and splenic vein are  patent. 4. Cholelithiasis with apparent gallbladder wall thickening and mural edema. Acute cholecystitis not excluded. 5. MRCP images are motion degraded but there is no extrahepatic biliary duct dilatation. No evidence for choledocholithiasis. Some element of mass-effect on the confluence of the left and right hepatic ducts cannot be excluded. 6. The pulmonary nodule seen on  recent chest CT are not well demonstrated by MRI. Electronically Signed   By: Kennith Center M.D.   On: 12/23/2022 06:46   CT CHEST W CONTRAST  Result Date: 12/22/2022 CLINICAL DATA:  Inpatient. Abnormal CT abdomen/pelvis study with liver lesions and retroperitoneal adenopathy suspicious for metastatic disease of uncertain primary. * Tracking Code: BO * EXAM: CT CHEST WITH CONTRAST TECHNIQUE: Multidetector CT imaging of the chest was performed during intravenous contrast administration. RADIATION DOSE REDUCTION: This exam was performed according to the departmental dose-optimization program which includes automated exposure control, adjustment of the mA and/or kV according to patient size and/or use of iterative reconstruction technique. CONTRAST:  75mL OMNIPAQUE IOHEXOL 300 MG/ML  SOLN COMPARISON:  12/20/2022 CT abdomen/pelvis and chest radiograph. FINDINGS: Cardiovascular: Normal heart size. No significant pericardial effusion/thickening. Three-vessel coronary atherosclerosis. Atherosclerotic nonaneurysmal thoracic aorta. Normal caliber pulmonary arteries. No central pulmonary emboli. Mediastinum/Nodes: Hypodense posterior right thyroid 1.5 cm nodule. Unremarkable esophagus. Mild left supraclavicular adenopathy up to 1.0 cm (series 2/image 15). No axillary adenopathy. Mildly enlarged low posterior mediastinal para-lymph nodes up to 1.2 cm on the right (series 2/image 104). No hilar adenopathy. Lungs/Pleura: No pneumothorax. No pleural effusion. No acute consolidative airspace disease or lung masses. At least five solid pulmonary nodules  scattered in the lungs bilaterally, largest 0.5 cm in the posteromedial basilar subpleural right lower lobe (series 6/image 107), 0.4 cm in the medial left lower lobe (series 6/image 100) and 0.4 cm in the anterior right middle lobe (series 6/image 70). Upper abdomen: Multiple persistent hypodense superior right liver lesions, poorly defined on this chest CT study. Subcentimeter hypodense posterior upper left renal cortical lesion, too small to characterize, for which no follow-up imaging is recommended. Partially visualized porta hepatis, portacaval and aortocaval adenopathy in the upper abdomen, better seen on CT abdomen study from 1 day prior. Musculoskeletal: No aggressive appearing focal osseous lesions. Marked thoracic degenerative disc disease. IMPRESSION: 1. At least five solid pulmonary nodules scattered in the lungs bilaterally, largest 0.5 cm, equivocal for pulmonary metastases. Recommend attention on follow-up chest CT in 3 months. 2. Mild left supraclavicular and low posterior mediastinal lymphadenopathy, suspicious for nodal metastases. 3. Multiple persistent hypodense superior right liver lesions and partially visualized porta hepatis, portacaval and aortocaval adenopathy in the upper abdomen, all better seen on CT abdomen study from 1 day prior. 4. Hypodense posterior right thyroid 1.5 cm nodule. Recommend non-emergent thyroid ultrasound. Reference: J Am Coll Radiol. 2015 Feb;12(2): 143-50 5. Three-vessel coronary atherosclerosis. 6.  Aortic Atherosclerosis (ICD10-I70.0). Electronically Signed   By: Delbert Phenix M.D.   On: 12/22/2022 08:00   US Abdomen Limited RUQ (LIVER/GB)  Result Date: 12/21/2022 CLINICAL DATA:  Abdominal pain EXAM: ULTRASOUND ABDOMEN LIMITED RIGHT UPPER QUADRANT COMPARISON:  CT from the previous day FINDINGS: Gallbladder: Gallbladder incompletely distended. Mild gallbladder wall thickening up to 6 mm. To nerve root reports no sonographic Murphy sign however. Several  gallstones measuring up to 8 mm in the lumen. Common bile duct: Diameter: 4 mm.  No intrahepatic biliary ductal dilatation. Liver: Scattered echogenic lesions corresponding to CT findings. Portal vein is patent on color Doppler imaging with normal direction of blood flow towards the liver. Other: Technologist describes technically difficult study secondary to patient breathing, overlying bowel, body habitus. IMPRESSION: 1. Cholelithiasis with mild gallbladder wall thickening. No sonographic Murphy sign. 2. No biliary dilatation. 3. Scattered echogenic liver lesions corresponding to CT findings, presumed metastatic disease. Electronically Signed   By: Corlis Leak M.D.   On: 12/21/2022  11:16   CT ABDOMEN PELVIS W CONTRAST  Result Date: 12/20/2022 CLINICAL DATA:  72 year old female with acute abdominal and pelvic pain. EXAM: CT ABDOMEN AND PELVIS WITH CONTRAST TECHNIQUE: Multidetector CT imaging of the abdomen and pelvis was performed using the standard protocol following bolus administration of intravenous contrast. RADIATION DOSE REDUCTION: This exam was performed according to the departmental dose-optimization program which includes automated exposure control, adjustment of the mA and/or kV according to patient size and/or use of iterative reconstruction technique. CONTRAST:  OMNIPAQUE IOHEXOL 300 MG/ML  SOLN COMPARISON:  05/17/2019 CT and prior studies FINDINGS: Lower chest: No acute abnormality. Hepatobiliary: There are multiple low-density lesions within both the RIGHT and LEFT liver, with index 1.5 x 2 cm lesion and 1.5 x 2.5 cm lesion at the dome (image 10: Series 2). Gallbladder wall thickening and cholelithiasis noted. No definite biliary dilatation identified. Pancreas: Unremarkable Spleen: Unremarkable Adrenals/Urinary Tract: Punctate nonobstructing bilateral renal calculi are again identified without other significant change. There is no evidence of suspicious renal mass hydronephrosis. The  adrenal glands and bladder are unremarkable. Stomach/Bowel: No definite bowel wall thickening noted. There is no evidence of bowel obstruction or inflammatory changes. Vascular/Lymphatic: Periportal, mesenteric, periaortic and retroperitoneal adenopathy identified with the index 1.6 x 3.9 cm portacaval node (22:2) identified. Aortic atherosclerotic calcifications noted without aneurysm. Reproductive: Uterus and adnexal regions are unchanged with ureteral calcifications/fibroids. Other: No ascites, focal collection or pneumoperitoneum. Musculoskeletal: No acute or suspicious bony abnormalities are noted. Moderate degenerative disc disease/spondylosis at T12-L1 and L4-5 again noted. IMPRESSION: 1. Multiple low-density lesions throughout the liver and abdominal/retroperitoneal lymphadenopathy, highly suspicious for malignancy/metastatic disease. Primary malignancy is not identified on this examination. 2. Gallbladder wall thickening and cholelithiasis without definite biliary dilatation. If there is strong clinical suspicion for acute cholecystitis, recommend ultrasound or nuclear medicine study. 3. Punctate nonobstructing bilateral renal calculi. 4.  Aortic Atherosclerosis (ICD10-I70.0). Electronically Signed   By: Harmon Pier M.D.   On: 12/20/2022 15:38   DG Chest Port 1 View  Result Date: 12/20/2022 CLINICAL DATA:  Nausea and vomiting.  Melena.  Sepsis. EXAM: PORTABLE CHEST 1 VIEW COMPARISON:  10/10/2020 FINDINGS: Patient is rotated to the left. Heart size is within normal limits. Aortic atherosclerotic calcification incidentally noted. Both lungs are clear. IMPRESSION: No active disease. Electronically Signed   By: Danae Orleans M.D.   On: 12/20/2022 13:55     No orders of the defined types were placed in this encounter.   All questions were answered. The patient knows to call the clinic with any problems, questions or concerns. The total time spent in the appointment was 60 minutes.     Malachy Mood,  MD 12/30/2022 4:06 PM  I, Monica Martinez am acting as scribe for Malachy Mood, MD.   I have reviewed the above documentation for accuracy and completeness, and I agree with the above.

## 2022-12-30 NOTE — Assessment & Plan Note (Signed)
-   suspected GI source; seen by ID last hospitalization as well. -Treated with Rocephin and transitioned to Augmentin to complete 14-day course.  Last dose of Augmentin was morning of 12/30/2022 -She has had some nausea and decreased appetite, therefore we will switch back to IV antibiotic for definitive administration -Outpatient follow up with ID was scheduled for 01/05/2023

## 2022-12-30 NOTE — Assessment & Plan Note (Signed)
-   Continue home basal insulin but lower dose until diet further advanced - Continue SSI and CBG monitoring

## 2022-12-30 NOTE — Assessment & Plan Note (Signed)
-   Noted on prior workup last hospitalization with recommendation for nonemergent ultrasound.  Also seen by outpatient primary care and thyroid ultrasound was ordered, since patient readmitted will go ahead and obtain ultrasound to complete workup

## 2022-12-30 NOTE — Assessment & Plan Note (Signed)
-   Recent workup during last hospitalization.  Surgery recommended against HIDA scan due to low suspicion for acute cholecystitis.  She still remains asymptomatic with no abdominal pain complaints but has had some mild nausea and decreased appetite -Prior workup showed cholelithiasis with mild gallbladder wall thickening but no biliary dilatation nor any choledocholithiasis.  She had RUQ ultrasound and MRCP performed on 12/21/2022 and 12/22/2022 respectively -LFTs repeated at oncology visit today and are higher concerning for obstruction at this time. AST 74, ALT 62, AP 165, TB 9.  Other considered etiology would be liver involvement from underlying malignancy -Will go ahead and repeat MRCP at this time -Oncology note reviewed and GI consulted.  Since liver biopsy negative prior hospitalization, will see if patient candidate for ERCP with brush biopsies versus repeat liver biopsy - CLD, NPO at [facility] in case of procedure tomorrow, otherwise resume diet - MRCP ordered

## 2022-12-30 NOTE — Plan of Care (Signed)
Plan of Care Note for Accepted Transfer   Patient: Regina Mckay    WUJ:[id number]     Facility requesting transfer: [facility] Requesting Provider: Dr. Marta Lamas Regina Mckay is a 72 y.o. female with h/o obesity, HTN, IDDM2 also on Ozempic admitted to [facility] until 12/24/2022 with complaints of nausea vomiting diarrhea.  During that hospital stay, she was found to have Klebsiella positive blood cultures, and seen by GI due to elevated bilirubin.  She was discharged with 14-day course of Augmentin.  She has imaging concerning for multiple lesions throughout the liver, abdomen and lungs concerning for possible metastatic malignancy.  Today she went to see oncology as an outpatient, was noted to have markedly elevated bilirubin 9.0 and so Dr. Mosetta Putt requested direct admission.  Dr. Mosetta Putt will follow in-house, recommends GI consultation due to worsening jaundice, and to consider ERCP with brushing to assist in diagnosis.  Most recent vitals, labs and radiology:  There were no vitals taken for this visit.      Latest Ref Rng & Units 12/30/2022   12:10 PM 12/24/2022    4:34 AM 12/23/2022    4:28 AM  CBC  WBC 4.0 - 10.5 K/uL 9.5  7.1  5.3   Hemoglobin 12.0 - 15.0 g/dL 95.6  21.3  08.6   Hematocrit 36.0 - 46.0 % 43.5  39.7  39.5   Platelets 150 - 400 K/uL 295  207  198       Latest Ref Rng & Units 12/30/2022   12:10 PM 12/24/2022    4:34 AM 12/23/2022    4:28 AM  BMP  Glucose 70 - 99 mg/dL 578  469  629   BUN 8 - 23 mg/dL 9  14  16    Creatinine 0.44 - 1.00 mg/dL 5.28  4.13  2.44   Sodium 135 - 145 mmol/L 137  138  137   Potassium 3.5 - 5.1 mmol/L 3.8  3.6  3.9   Chloride 98 - 111 mmol/L 96  104  105   CO2 22 - 32 mmol/L 33  26  26   Calcium 8.9 - 10.3 mg/dL 8.7  8.0  7.9      No results found.   The patient has been accepted for transfer to [facility] to medical surgical unit.  The patient will remain under the care and responsibility of the referring provider until they  have arrived to our inpatient facility.  Author: Conya Ellinwood Sharlette Dense, MD  12/30/2022  Check [email or web address] for on-call coverage.  Nursing staff, Please call [facility] Admits & Consults System-Wide number on Amion as soon as patient's arrival, so appropriate admitting provider can evaluate the pt.

## 2022-12-31 ENCOUNTER — Inpatient Hospital Stay (HOSPITAL_COMMUNITY): Payer: Medicare Other

## 2022-12-31 ENCOUNTER — Encounter (HOSPITAL_COMMUNITY): Payer: Self-pay | Admitting: Internal Medicine

## 2022-12-31 DIAGNOSIS — E119 Type 2 diabetes mellitus without complications: Secondary | ICD-10-CM | POA: Diagnosis not present

## 2022-12-31 DIAGNOSIS — I10 Essential (primary) hypertension: Secondary | ICD-10-CM | POA: Diagnosis not present

## 2022-12-31 DIAGNOSIS — E041 Nontoxic single thyroid nodule: Secondary | ICD-10-CM | POA: Diagnosis not present

## 2022-12-31 DIAGNOSIS — R932 Abnormal findings on diagnostic imaging of liver and biliary tract: Secondary | ICD-10-CM | POA: Diagnosis not present

## 2022-12-31 DIAGNOSIS — R17 Unspecified jaundice: Secondary | ICD-10-CM | POA: Diagnosis not present

## 2022-12-31 DIAGNOSIS — R7989 Other specified abnormal findings of blood chemistry: Secondary | ICD-10-CM | POA: Diagnosis not present

## 2022-12-31 DIAGNOSIS — E113211 Type 2 diabetes mellitus with mild nonproliferative diabetic retinopathy with macular edema, right eye: Secondary | ICD-10-CM

## 2022-12-31 DIAGNOSIS — R7881 Bacteremia: Secondary | ICD-10-CM | POA: Diagnosis not present

## 2022-12-31 DIAGNOSIS — Z794 Long term (current) use of insulin: Secondary | ICD-10-CM

## 2022-12-31 LAB — COMPREHENSIVE METABOLIC PANEL
ALT: 54 U/L — ABNORMAL HIGH (ref 0–44)
AST: 69 U/L — ABNORMAL HIGH (ref 15–41)
Albumin: 2.5 g/dL — ABNORMAL LOW (ref 3.5–5.0)
Alkaline Phosphatase: 122 U/L (ref 38–126)
Anion gap: 9 (ref 5–15)
BUN: 8 mg/dL (ref 8–23)
CO2: 29 mmol/L (ref 22–32)
Calcium: 8 mg/dL — ABNORMAL LOW (ref 8.9–10.3)
Chloride: 98 mmol/L (ref 98–111)
Creatinine, Ser: 0.71 mg/dL (ref 0.44–1.00)
GFR, Estimated: >60 mL/min (ref >60)
Glucose, Bld: 104 mg/dL — ABNORMAL HIGH (ref 70–99)
Potassium: 3.1 mmol/L — ABNORMAL LOW (ref 3.5–5.1)
Sodium: 136 mmol/L (ref 135–145)
Total Bilirubin: 7.6 mg/dL — ABNORMAL HIGH (ref 0.3–1.2)
Total Protein: 6.3 g/dL — ABNORMAL LOW (ref 6.5–8.1)

## 2022-12-31 LAB — CBC WITH DIFFERENTIAL/PLATELET
Abs Immature Granulocytes: 0.17 10*3/uL — ABNORMAL HIGH (ref 0.00–0.07)
Basophils Absolute: 0 10*3/uL (ref 0.0–0.1)
Basophils Relative: 0 %
Eosinophils Absolute: 0.1 10*3/uL (ref 0.0–0.5)
Eosinophils Relative: 1 %
HCT: 39.1 % (ref 36.0–46.0)
Hemoglobin: 13 g/dL (ref 12.0–15.0)
Immature Granulocytes: 2 %
Lymphocytes Relative: 10 %
Lymphs Abs: 0.9 10*3/uL (ref 0.7–4.0)
MCH: 29.1 pg (ref 26.0–34.0)
MCHC: 33.2 g/dL (ref 30.0–36.0)
MCV: 87.5 fL (ref 80.0–100.0)
Monocytes Absolute: 0.8 10*3/uL (ref 0.1–1.0)
Monocytes Relative: 10 %
Neutro Abs: 6.4 10*3/uL (ref 1.7–7.7)
Neutrophils Relative %: 77 %
Platelets: 256 10*3/uL (ref 150–400)
RBC: 4.47 MIL/uL (ref 3.87–5.11)
RDW: 14.5 % (ref 11.5–15.5)
WBC: 8.4 10*3/uL (ref 4.0–10.5)
nRBC: 0 % (ref 0.0–0.2)

## 2022-12-31 LAB — GLUCOSE, CAPILLARY
Glucose-Capillary: 103 mg/dL — ABNORMAL HIGH (ref 70–99)
Glucose-Capillary: 110 mg/dL — ABNORMAL HIGH (ref 70–99)
Glucose-Capillary: 174 mg/dL — ABNORMAL HIGH (ref 70–99)
Glucose-Capillary: 133 mg/dL — ABNORMAL HIGH (ref 70–99)

## 2022-12-31 LAB — MAGNESIUM: Magnesium: 1.6 mg/dL — ABNORMAL LOW (ref 1.7–2.4)

## 2022-12-31 MED ORDER — GADOBUTROL 1 MMOL/ML IV SOLN
10.0000 mL | Freq: Once | INTRAVENOUS | Status: AC | PRN
  Administered 2022-12-31: 10 mL via INTRAVENOUS

## 2022-12-31 MED ORDER — GADOBUTROL 1 MMOL/ML IV SOLN: 10.0000 mL | Freq: Once | INTRAVENOUS | Status: DC | PRN

## 2022-12-31 MED ORDER — LORAZEPAM 2 MG/ML IJ SOLN
0.2500 mg | Freq: Once | INTRAMUSCULAR | Status: AC | PRN
  Administered 2022-12-31: 0.25 mg via INTRAVENOUS
  Filled 2022-12-31: qty 1

## 2022-12-31 NOTE — Progress Notes (Signed)
[facility]                                                                              [name], is a 72 y.o. female, DOB - [birth year], ZOX:[id number] Admit date - 12/30/2022    Outpatient Primary MD for the patient is [name], NP  LOS - 1  days  No chief complaint on file.      Brief summary   Patient is a 72 year old female with obesity, HTN, IDDM 2admitted to [facility] 5/18 - 5/22 with nausea, vomiting, diarrhea.  During that hospital stay, she had a Klebsiella bacteremia, seen by GI due to elevated bilirubin.  She was discharged with 14-day course of Augmentin. She had imaging concerning for multiple lesions throughout the liver, abdomen, and lungs concerning for possible metastatic malignancy.  She had followup with oncology on 5/28 and was found to have further markedly elevated TBi 9, and was referred to the hospital per oncology.    Assessment & Plan    Principal Problem: Elevated LFTs with hyperbilirubinemia -Recent workup during previous hospitalization, surgery recommended against HIDA scan due to low suspicion for acute cholecystitis.  Previous workup showed cholelithiasis with mild gallbladder wall thickening but no biliary dilatation or choledocholithiasis except metastasis disease. 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 Mosetta Putt, MRCP consistent with metastasis, bulky abdominal lymphadenopathy with nodal metastasis.  She had previously undergone liver biopsy however was negative for malignant cells. -Await GI recommendations regarding ERCP with possible biopsy versus repeat liver biopsy -Oncology  following  Klebsiella pneumoniae bacteremia - suspected GI source; seen by ID last hospitalization as well. 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   Weight as of this encounter: 100.7 kg.  Code Status: Full code DVT Prophylaxis:  heparin injection 5,000 Units Start: 12/30/22 2200   Level of Care: Level of care: Med-Surg Family Communication: Updated patient's sister at the bedside Disposition Plan:      Remains inpatient appropriate: Workup in progress  Procedures:  None  Consultants:   Gastroenterology Oncology  Antimicrobials:   Anti-infectives (From admission, onward)    Start     Dose/Rate Route Frequency Ordered Stop   12/30/22 1800  cefTRIAXone (ROCEPHIN) 2 g in sodium chloride 0.9 % 100 mL IVPB        2 g 200 mL/hr over 30 Minutes Intravenous Daily 12/30/22 1630            Medications  amLODipine  10 mg Oral Daily   heparin  5,000 Units Subcutaneous Q8H   insulin aspart  0-15 Units Subcutaneous TID WC   insulin aspart  0-5 Units Subcutaneous QHS   insulin  glargine-yfgn  10 Units Subcutaneous Daily   lisinopril  20 mg Oral Daily   sodium chloride flush  3 mL Intravenous Q12H      Subjective:   Regina Mckay was seen and examined today.  Currently no acute complaints.  Sister at the bedside.  No acute nausea vomiting, abdominal pain, fevers.    Objective:   Vitals:   12/30/22 1550 12/30/22 1955 12/30/22 2351 12/31/22 0344  BP: (!) 144/76 (!) 151/64 (!) 148/66 139/75  Pulse: 81 75 65 74  Resp: 18 16 17 18   Temp: 99 F (37.2 C) 98.8 F (37.1 C) 99.1 F (37.3 C) 98.9 F (37.2 C)  TempSrc: Oral Oral Oral Oral  SpO2:  97% 94% 96%  Weight: 100.7 kg     Height: 5\' 3"  (1.6 m)       Intake/Output Summary (Last 24 hours) at 12/31/2022 1235 Last data filed at 12/31/2022 1042 Gross per 24 hour  Intake 705.35 ml  Output --  Net 705.35 ml     Wt Readings from Last 3 Encounters:  12/30/22 100.7 kg  12/30/22 10.3 kg  12/26/22 103.1 kg     Exam General: Alert and oriented x 3, NAD, scleral icterus Cardiovascular: S1 S2 auscultated,  RRR Respiratory: Clear to auscultation bilaterally, no wheezing, rales or rhonchi Gastrointestinal: Soft, nontender, nondistended, + bowel sounds Ext: no pedal edema bilaterally Neuro: no new deficits Psych: Normal affect     Data Reviewed:  I have personally reviewed following labs    CBC Lab Results  Component Value Date   WBC 8.4 12/31/2022   RBC 4.47 12/31/2022   HGB 13.0 12/31/2022   HCT 39.1 12/31/2022   MCV 87.5 12/31/2022   MCH 29.1 12/31/2022   PLT 256 12/31/2022   MCHC 33.2 12/31/2022   RDW 14.5 12/31/2022   LYMPHSABS 0.9 12/31/2022   MONOABS 0.8 12/31/2022   EOSABS 0.1 12/31/2022   BASOSABS 0.0 12/31/2022     Last metabolic panel Lab Results  Component Value Date   NA 136 12/31/2022   K 3.1 (L) 12/31/2022   CL 98 12/31/2022   CO2 29 12/31/2022   BUN 8 12/31/2022   CREATININE 0.71 12/31/2022   GLUCOSE 104 (H) 12/31/2022   GFRNONAA >60 12/31/2022   GFRAA >60  05/17/2019   CALCIUM 8.0 (L) 12/31/2022   PHOS 2.2 (L) 12/24/2022   PROT 6.3 (L) 12/31/2022   ALBUMIN 2.5 (L) 12/31/2022   BILITOT 7.6 (H) 12/31/2022   ALKPHOS 122 12/31/2022   AST 69 (H) 12/31/2022   ALT 54 (H) 12/31/2022   ANIONGAP 9 12/31/2022    CBG (last 3)  Recent Labs    12/30/22 2126 12/31/22 0745 12/31/22 1152  GLUCAP 172* 103* 110*      Coagulation Profile: No results for input(s): "INR", "PROTIME" in the last 168  hours.   Radiology Studies: I have personally reviewed the imaging studies  MR ABDOMEN MRCP W WO CONTAST  Result Date: 12/31/2022 CLINICAL DATA:  Elevated liver enzymes, multiple liver lesions recently biopsied EXAM: MRI ABDOMEN WITHOUT AND WITH CONTRAST (INCLUDING MRCP) TECHNIQUE: Multiplanar multisequence MR imaging of the abdomen was performed both before and after the administration of intravenous contrast. Heavily T2-weighted images of the biliary and pancreatic ducts were obtained, and three-dimensional MRCP images were rendered by post processing. CONTRAST:  10mL GADAVIST GADOBUTROL 1 MMOL/ML IV SOLN COMPARISON:  12/22/2022 FINDINGS: Lower chest: No acute abnormality. Hepatobiliary: Multiple hypoenhancing liver lesions are again seen, the largest within the peripheral liver dome, hepatic segments VII and VIII (series 18, image 11). There are multiple additional smaller hypoenhancing lesions scattered throughout the liver, predominantly in the inferior right lobe, not described by prior examination but unchanged (series 18, image 38, 25). Small gallstones (series 8, image 20). No gallbladder wall thickening, or biliary dilatation. Pancreas: Unremarkable. No pancreatic ductal dilatation or surrounding inflammatory changes. Spleen: Normal in size without significant abnormality. Adrenals/Urinary Tract: Adrenal glands are unremarkable. Kidneys are normal, without renal calculi, solid lesion, or hydronephrosis. Stomach/Bowel: Stomach is within normal limits. No  evidence of bowel wall thickening, distention, or inflammatory changes. Vascular/Lymphatic: No significant vascular findings are present. Unchanged bulky, matted gastrohepatic ligament, celiac axis, portacaval, retroperitoneal, and small bowel mesenteric lymph nodes. Other: No abdominal wall hernia or abnormality. Trace perihepatic ascites. Musculoskeletal: No acute or significant osseous findings. IMPRESSION: 1. Multiple hypoenhancing liver lesions are again seen, the largest within the peripheral liver dome, hepatic segments VII and VIII. There are multiple additional smaller hypoenhancing lesions scattered throughout the liver, predominantly in the inferior right lobe, not described by prior examination but unchanged. Findings remain most consistent with metastases. Correlate with biopsy findings. 2. Unchanged bulky, matted abdominal lymphadenopathy, consistent with nodal metastatic disease. 3. Cholelithiasis without evidence of acute cholecystitis. 4. No biliary ductal dilatation. 5. Trace perihepatic ascites. Electronically Signed   By: Jearld Lesch M.D.   On: 12/31/2022 12:18   US THYROID  Result Date: 12/31/2022 CLINICAL DATA:  Incidental on CT. EXAM: THYROID ULTRASOUND TECHNIQUE: Ultrasound examination of the thyroid gland and adjacent soft tissues was performed. COMPARISON:  None Available. FINDINGS: Parenchymal Echotexture: Moderately heterogenous Isthmus: 1.0 cm Right lobe: 5.7 x 2.7 x 3.1 cm Left lobe: 4.3 x 1.7 x 2.3 cm _________________________________________________________ Estimated total number of nodules >/= 1 cm: 3 Number of spongiform nodules >/=  2 cm not described below (TR1): 0 Number of mixed cystic and solid nodules >/= 1.5 cm not described below (TR2): 0 _________________________________________________________ Nodule # 1: Isoechoic ill-defined solid nodule in the right mid gland measures 2.1 x 1.9 x 1.6 cm. No evidence of calcifications. Findings are consistent with TI-RADS category  3. *Given size (>/= 1.5 - 2.4 cm) and appearance, a follow-up ultrasound in 1 year should be considered based on TI-RADS criteria. Nodule # 2: Smaller 1.4 cm isoechoic solid nodule versus pseudo nodule in the right inferior gland. Given size (<1.4 cm) and appearance, this nodule does NOT meet TI-RADS criteria for biopsy or dedicated follow-up. Nodule # 3: Ill-defined 1.3 cm hypoechoic solid nodule in the left inferior gland. TI-RADS category 4. *Given size (>/= 1 - 1.4 cm) and appearance, a follow-up ultrasound in 1 year should be considered based on TI-RADS criteria. IMPRESSION: 1. Enlarged, heterogeneous and multinodular thyroid gland. 2. Nodule # 1 is a 2.1 cm TI-RADS category 3 nodule in the right mid gland  meets criteria for imaging surveillance. Recommend follow-up ultrasound in 1 year. 3. Nodule # 3 is a 1.3 cm TI-RADS category 4 nodule in the left inferior gland and also meets criteria for imaging surveillance. The above is in keeping with the ACR TI-RADS recommendations - J Am Coll Radiol 2017;14:587-595. Electronically Signed   By: Malachy Moan M.D.   On: 12/31/2022 06:55       Makai Dumond M.D. [facility] 12/31/2022, 12:35 PM  Available via Epic secure chat 7am-7pm After 7 pm, please refer to night coverage provider listed on amion.

## 2022-12-31 NOTE — Consult Note (Signed)
     Consultation  Referring Provider:  TRH  Primary Care Physician:  Gray, Sarah E, NP Primary Gastroenterologist:  Unassigned       Reason for Consultation:    Painless jaundice  LOS: 1 day          HPI:   Regina Mckay is a 72 y.o. female with past medical history significant for hypertension, DM2, presents for evaluation of painless jaundice.  Recently admitted to [facility] 5/18-5/22 with nausea, vomiting, diarrhea.  On Ozempic (for 2 years).  She was found to have Klebsiella positive blood cultures.  Suspected biliary source since during that time patient had elevated bilirubin (2.2) and gram-negative bacteremia.  MRCP at that time with no choledocholithiasis.  Had some gallbladder wall thickening and mural edema as well as stones.  Surgery was consulted and recommended against HIDA scan given low clinical suspicion for cholecystitis with absence of abdominal pain.  ID was then consulted and patient was placed on Augmentin for 14 days on discharge  CT chest/ab/pelvis during admission was concerning for metastatic malignancy with multiple lesions throughout lungs, liver, and abdomen. CEA elevated (18.9).  AFP mildly elevated (10.2).  She underwent liver biopsy, biopsy was negative for malignant cells.  Oncology reports this could be false negative.  Patient had outpatient follow-up with oncology 5/28 and was found to have markedly elevated total bilirubin (9) and was referred to the hospital by oncology for obstructive jaundice.  Oncology admitted for further workup and hopes of possible ERCP and stent placement as well as biliary brushing and cytology.  If not feasible they plan to repeat liver biopsy by IR.  No previous colonoscopy/EGD. Denies family history of colon cancer. States her nausea, vomiting, and diarrhea have improved on Augmentin. She still has 7 days left of medication. Reports 12 lb unintentional weight loss from Oct 2023 to April 2024. States she noticed her urine  became dark last Thursday (5/23)  Pertinent values on admission -No leukocytosis -AST 74/ALT 62/alk phos 165 -Total bilirubin 9.0 -IgG4 in process -MRCP ordered  Past Medical History:  Diagnosis Date   Diabetes mellitus without complication (HCC)    Hypertension     Surgical History:  She  has a past surgical history that includes Replacement total knee; Tonsillectomy; Craniotomy (N/A, 10/11/2020); and Application of cranial navigation (N/A, 10/11/2020). Family History:  Her family history includes Alcohol abuse in her brother, brother, brother, daughter, father, mother, sister, and sister; Arthritis in her father, mother, and sister; Cancer in her paternal aunt; Diabetes in her brother and sister; Stroke in her mother. Social History:   reports that she has quit smoking. Her smoking use included cigarettes. She has never used smokeless tobacco. She reports that she does not currently use alcohol. She reports that she does not use drugs.  Prior to Admission medications   Medication Sig Start Date End Date Taking? Authorizing Provider  acetaminophen (TYLENOL) 500 MG tablet Take 500-1,000 mg by mouth every 4 (four) hours as needed (for soreness or headaches).   Yes [provider]  amLODipine (NORVASC) 10 MG tablet Take 1 tablet (10 mg total) by mouth daily. Follow-up appt due in July must see provider for future refills Patient taking differently: Take 10 mg by mouth daily. 11/06/22  Yes Gray, Sarah E, NP  amoxicillin-clavulanate (AUGMENTIN) 875-125 MG tablet Take 1 tablet by mouth 2 (two) times daily for 14 days. 12/24/22 01/07/23 Yes Gherghe, Costin M, MD  bismuth subsalicylate (PEPTO BISMOL) 262 MG chewable   tablet Chew 524 mg by mouth as needed for indigestion or diarrhea or loose stools.   Yes [provider]  cholecalciferol (VITAMIN D3) 10 MCG (400 UNIT) TABS tablet Take 400 Units by mouth daily.   Yes [provider]  Continuous Blood Gluc Receiver (FREESTYLE  LIBRE 2 READER) DEVI 1 each by Does not apply route daily. 10/25/21  Yes Gherghe, Cristina, MD  Continuous Blood Gluc Sensor (FREESTYLE LIBRE 2 SENSOR) MISC USE 1 SENSOR EVERY 14 DAYS AS DIRECTED Patient taking differently: Inject 1 Device into the skin every 14 (fourteen) days. 09/30/22  Yes Gherghe, Cristina, MD  hydrochlorothiazide (MICROZIDE) 12.5 MG capsule Take 1 capsule (12.5 mg total) by mouth daily. 05/28/22  Yes Gray, Sarah E, NP  insulin aspart (NOVOLOG FLEXPEN) 100 UNIT/ML FlexPen Inject 5-7 units under skin up to 2x a day Patient taking differently: Inject 7-9 Units into the skin See admin instructions. Inject 7-9 into the skin twice a day 15 minutes before meals 05/06/22  Yes Gherghe, Cristina, MD  Insulin Glargine (BASAGLAR KWIKPEN) 100 UNIT/ML Inject 22 Units into the skin daily. Patient taking differently: Inject 22 Units into the skin in the morning. 05/06/22  Yes Gherghe, Cristina, MD  lisinopril (ZESTRIL) 20 MG tablet Take 1 tablet (20 mg total) by mouth daily. Follow-up appt due in July Patient taking differently: Take 20 mg by mouth daily. 11/17/22  Yes Gray, Sarah E, NP  ondansetron (ZOFRAN) 4 MG tablet Take 1 tablet (4 mg total) by mouth every 8 (eight) hours as needed for nausea or vomiting. Patient taking differently: Take 4 mg by mouth 2 (two) times daily as needed (for extreme nausea caused by Augmentin). 12/26/22  Yes Gray, Sarah E, NP  oxyCODONE (OXY IR/ROXICODONE) 5 MG immediate release tablet Take 0.5-1 tablets (2.5-5 mg total) by mouth every 4 (four) hours as needed for moderate pain. 12/24/22  Yes Gherghe, Costin M, MD  pravastatin (PRAVACHOL) 40 MG tablet Take 1 tablet (40 mg total) by mouth daily. Patient taking differently: Take 40 mg by mouth at bedtime. 08/25/22  Yes Gray, Sarah E, NP  Semaglutide,0.25 or 0.5MG/DOS, 2 MG/3ML SOPN Inject 0.25-0.5 mg into the skin once a week. Patient taking differently: Inject 0.5 mg into the skin every Friday. 11/06/22  Yes Gherghe,  Cristina, MD  SYSTANE HYDRATION PF 0.4-0.3 % SOLN Place 1 drop into both eyes 3 (three) times daily as needed (for dryness or irritation).   Yes [provider]  Insulin Pen Needle 32G X 4 MM MISC Use 4x a day Patient taking differently: 1 each by Other route See admin instructions. Use 4x a day 09/13/21   Gherghe, Cristina, MD  lidocaine (LIDODERM) 5 % PLACE 1 PATCH ONTO THE SKIN DAILY. REMOVE& DISCARD PATCH WITHIN 12 HOURS Patient not taking: Reported on 12/30/2022 08/22/21   Blackman, Christopher Y, MD    Current Facility-Administered Medications  Medication Dose Route Frequency Provider Last Rate Last Admin   0.9 %  sodium chloride infusion   Intravenous Continuous Girguis, David, MD 75 mL/hr at 12/30/22 2300 New Bag/Given (Non-Interop) at 12/30/22 2300   alum & mag hydroxide-simeth (MAALOX/MYLANTA) 200-200-20 MG/5ML suspension 30 mL  30 mL Oral Q4H PRN Girguis, David, MD   30 mL at 12/30/22 2242   amLODipine (NORVASC) tablet 10 mg  10 mg Oral Daily Girguis, David, MD       cefTRIAXone (ROCEPHIN) 2 g in sodium chloride 0.9 % 100 mL IVPB  2 g Intravenous Daily Girguis, David, MD     Stopped at 12/30/22 1926   heparin injection 5,000 Units  5,000 Units Subcutaneous Q8H Girguis, David, MD   5,000 Units at 12/30/22 2222   hydrALAZINE (APRESOLINE) injection 10 mg  10 mg Intravenous Q4H PRN Girguis, David, MD       insulin aspart (novoLOG) injection 0-15 Units  0-15 Units Subcutaneous TID WC Girguis, David, MD       insulin aspart (novoLOG) injection 0-5 Units  0-5 Units Subcutaneous QHS Girguis, David, MD       insulin glargine-yfgn (SEMGLEE) injection 10 Units  10 Units Subcutaneous Daily Girguis, David, MD       labetalol (NORMODYNE) injection 10 mg  10 mg Intravenous Q4H PRN Girguis, David, MD       lisinopril (ZESTRIL) tablet 20 mg  20 mg Oral Daily Girguis, David, MD       LORazepam (ATIVAN) injection 0.25 mg  0.25 mg Intravenous Once PRN Daniels, James K, NP       ondansetron (ZOFRAN)  tablet 4 mg  4 mg Oral Q6H PRN Girguis, David, MD       Or   ondansetron (ZOFRAN) injection 4 mg  4 mg Intravenous Q6H PRN Girguis, David, MD       oxyCODONE (Oxy IR/ROXICODONE) immediate release tablet 2.5-5 mg  2.5-5 mg Oral Q4H PRN Girguis, David, MD       sodium chloride flush (NS) 0.9 % injection 3 mL  3 mL Intravenous Q12H Girguis, David, MD   3 mL at 12/30/22 2223    Allergies as of 12/30/2022 - Review Complete 12/30/2022  Allergen Reaction Noted   Augmentin [amoxicillin-pot clavulanate] Nausea Only and Other (See Comments) 12/30/2022   Lidoderm [lidocaine] Other (See Comments) 12/30/2022    Review of Systems  Constitutional:  Positive for weight loss. Negative for chills and fever.  HENT:  Negative for hearing loss and tinnitus.   Eyes:  Negative for blurred vision and double vision.  Respiratory:  Negative for cough and hemoptysis.   Cardiovascular:  Negative for chest pain and palpitations.  Gastrointestinal:  Negative for abdominal pain, blood in stool, constipation, diarrhea, heartburn, melena, nausea and vomiting.  Genitourinary:  Negative for dysuria and urgency.  Musculoskeletal:  Negative for myalgias and neck pain.  Skin:  Negative for itching and rash.  Neurological:  Negative for seizures and loss of consciousness.  Psychiatric/Behavioral:  Negative for depression and suicidal ideas.        Physical Exam:  Vital signs in last 24 hours: Temp:  [98.8 F (37.1 C)-99.1 F (37.3 C)] 98.9 F (37.2 C) (05/29 0344) Pulse Rate:  [65-85] 74 (05/29 0344) Resp:  [16-18] 18 (05/29 0344) BP: (139-160)/(64-76) 139/75 (05/29 0344) SpO2:  [94 %-98 %] 96 % (05/29 0344) Weight:  [10.3 kg-100.7 kg] 100.7 kg (05/28 1550) Last BM Date : 12/30/22 Last BM recorded by nurses in past 5 days No data recorded  Physical Exam Constitutional:      Appearance: Normal appearance. She is obese.  HENT:     Nose: Nose normal. No congestion.     Mouth/Throat:     Mouth: Mucous membranes  are moist.     Pharynx: Oropharynx is clear.  Eyes:     General: Scleral icterus present.     Extraocular Movements: Extraocular movements intact.  Cardiovascular:     Rate and Rhythm: Normal rate and regular rhythm.  Pulmonary:     Effort: Pulmonary effort is normal. No respiratory distress.  Abdominal:     General: Abdomen is   flat. Bowel sounds are normal. There is no distension.     Palpations: Abdomen is soft. There is no mass.     Tenderness: There is no abdominal tenderness. There is no guarding or rebound.     Hernia: No hernia is present.  Musculoskeletal:        General: No swelling. Normal range of motion.     Cervical back: Normal range of motion and neck supple.  Skin:    General: Skin is warm and dry.  Neurological:     General: No focal deficit present.     Mental Status: She is oriented to person, place, and time.  Psychiatric:        Mood and Affect: Mood normal.        Behavior: Behavior normal.        Thought Content: Thought content normal.        Judgment: Judgment normal.      LAB RESULTS: Recent Labs    12/30/22 1210 12/31/22 0559  WBC 9.5 8.4  HGB 14.7 13.0  HCT 43.5 39.1  PLT 295 256   BMET Recent Labs    12/30/22 1210 12/31/22 0559  NA 137 136  K 3.8 3.1*  CL 96* 98  CO2 33* 29  GLUCOSE 148* 104*  BUN 9 8  CREATININE 1.02* 0.71  CALCIUM 8.7* 8.0*   LFT Recent Labs    12/31/22 0559  PROT 6.3*  ALBUMIN 2.5*  AST 69*  ALT 54*  ALKPHOS 122  BILITOT 7.6*   PT/INR No results for input(s): "LABPROT", "INR" in the last 72 hours.  STUDIES: US THYROID  Result Date: 12/31/2022 CLINICAL DATA:  Incidental on CT. EXAM: THYROID ULTRASOUND TECHNIQUE: Ultrasound examination of the thyroid gland and adjacent soft tissues was performed. COMPARISON:  None Available. FINDINGS: Parenchymal Echotexture: Moderately heterogenous Isthmus: 1.0 cm Right lobe: 5.7 x 2.7 x 3.1 cm Left lobe: 4.3 x 1.7 x 2.3 cm  _________________________________________________________ Estimated total number of nodules >/= 1 cm: 3 Number of spongiform nodules >/=  2 cm not described below (TR1): 0 Number of mixed cystic and solid nodules >/= 1.5 cm not described below (TR2): 0 _________________________________________________________ Nodule # 1: Isoechoic ill-defined solid nodule in the right mid gland measures 2.1 x 1.9 x 1.6 cm. No evidence of calcifications. Findings are consistent with TI-RADS category 3. *Given size (>/= 1.5 - 2.4 cm) and appearance, a follow-up ultrasound in 1 year should be considered based on TI-RADS criteria. Nodule # 2: Smaller 1.4 cm isoechoic solid nodule versus pseudo nodule in the right inferior gland. Given size (<1.4 cm) and appearance, this nodule does NOT meet TI-RADS criteria for biopsy or dedicated follow-up. Nodule # 3: Ill-defined 1.3 cm hypoechoic solid nodule in the left inferior gland. TI-RADS category 4. *Given size (>/= 1 - 1.4 cm) and appearance, a follow-up ultrasound in 1 year should be considered based on TI-RADS criteria. IMPRESSION: 1. Enlarged, heterogeneous and multinodular thyroid gland. 2. Nodule # 1 is a 2.1 cm TI-RADS category 3 nodule in the right mid gland meets criteria for imaging surveillance. Recommend follow-up ultrasound in 1 year. 3. Nodule # 3 is a 1.3 cm TI-RADS category 4 nodule in the left inferior gland and also meets criteria for imaging surveillance. The above is in keeping with the ACR TI-RADS recommendations - J Am Coll Radiol 2017;14:587-595. Electronically Signed   By: Heath  McCullough M.D.   On: 12/31/2022 06:55      Impression    Painless Jaundice -   t bili 7.6 (9.0) -AST 69/ALT 54/alk phos 122, improving -No leukocytosis - Repeat MRCP ordered -MRCP 5/20: 3 liver lesions, bulky lymphadenopathy in hepatoduodenal ligament, cholelithiasis and gallbladder wall thickening with mural edema.  No choledocholithiasis.  -CEA 18.9 -AFP 10.2 - liver biopsy  negative for malignant cells Previous imaging with mets in lung, liver, and abdomen. No biliary duct dilation. At that time bilirubin was 2-3. Yesterday elevated to 9.0 with improvement to 7.6 today. Alk phos has normalized. Repeat MRCP ordered. Suspect there is a possibility with improvement in obstructive LFTs that patient could've passed a stone. However, interestingly patient has had no associated pain or other symptoms.  Type 2 diabetes   Plan   - Await MRCP results - If MRCP is positive, will proceed with ERCP. If bilirubin does not improve, can consider ERCP with stent placement. Although bilirubin improving compared to yesterday - Continue supportive care - If no role for ERCP, oncology can move forward with repeat liver bx. - continue to trend LFTs  Thank you for your kind consultation, we will continue to follow.  Nona Gracey M Ignatz Deis  12/31/2022, 8:15 AM  

## 2022-12-31 NOTE — H&P (View-Only) (Signed)
Consultation  Referring Provider:  [facility]  Primary Care Physician:  [name], NP Primary Gastroenterologist:  Gentry Fitz       Reason for Consultation:    Painless jaundice  LOS: 1 day          HPI:   Regina Mckay is a 72 y.o. female with past medical history significant for hypertension, DM2, presents for evaluation of painless jaundice.  Recently admitted to [facility] 5/18-5/22 with nausea, vomiting, diarrhea.  On Ozempic (for 2 years).  She was found to have Klebsiella positive blood cultures.  Suspected biliary source since during that time patient had elevated bilirubin (2.2) and gram-negative bacteremia.  MRCP at that time with no choledocholithiasis.  Had some gallbladder wall thickening and mural edema as well as stones.  Surgery was consulted and recommended against HIDA scan given low clinical suspicion for cholecystitis with absence of abdominal pain.  ID was then consulted and patient was placed on Augmentin for 14 days on discharge  CT chest/ab/pelvis during admission was concerning for metastatic malignancy with multiple lesions throughout lungs, liver, and abdomen. CEA elevated (18.9).  AFP mildly elevated (10.2).  She underwent liver biopsy, biopsy was negative for malignant cells.  Oncology reports this could be false negative.  Patient had outpatient follow-up with oncology 5/28 and was found to have markedly elevated total bilirubin (9) and was referred to the hospital by oncology for obstructive jaundice.  Oncology admitted for further workup and hopes of possible ERCP and stent placement as well as biliary brushing and cytology.  If not feasible they plan to repeat liver biopsy by IR.  No previous colonoscopy/EGD. Denies family history of colon cancer. States her nausea, vomiting, and diarrhea have improved on Augmentin. She still has 7 days left of medication. Reports 12 lb unintentional weight loss from Oct 2023 to April 2024. States she noticed her urine  became dark last Thursday (5/23)  Pertinent values on admission -No leukocytosis -AST 74/ALT 62/alk phos 165 -Total bilirubin 9.0 -IgG4 in process -MRCP ordered  Past Medical History:  Diagnosis Date   Diabetes mellitus without complication (HCC)    Hypertension     Surgical History:  She  has a past surgical history that includes Replacement total knee; Tonsillectomy; Craniotomy (N/A, 10/11/2020); and Application of cranial navigation (N/A, 10/11/2020). Family History:  Her family history includes Alcohol abuse in her brother, brother, brother, daughter, father, mother, sister, and sister; Arthritis in her father, mother, and sister; Cancer in her paternal aunt; Diabetes in her brother and sister; Stroke in her mother. Social History:   reports that she has quit smoking. Her smoking use included cigarettes. She has never used smokeless tobacco. She reports that she does not currently use alcohol. She reports that she does not use drugs.  Prior to Admission medications   Medication Sig Start Date End Date Taking? Authorizing Provider  acetaminophen (TYLENOL) 500 MG tablet Take 500-1,000 mg by mouth every 4 (four) hours as needed (for soreness or headaches).   Yes [provider]  amLODipine (NORVASC) 10 MG tablet Take 1 tablet (10 mg total) by mouth daily. Follow-up appt due in July must see provider for future refills Patient taking differently: Take 10 mg by mouth daily. 11/06/22  Yes [name], NP  amoxicillin-clavulanate (AUGMENTIN) 875-125 MG tablet Take 1 tablet by mouth 2 (two) times daily for 14 days. 12/24/22 01/07/23 Yes Gherghe, Daylene Katayama, MD  bismuth subsalicylate (PEPTO BISMOL) 262 MG chewable  tablet Chew 524 mg by mouth as needed for indigestion or diarrhea or loose stools.   Yes [provider]  cholecalciferol (VITAMIN D3) 10 MCG (400 UNIT) TABS tablet Take 400 Units by mouth daily.   Yes [provider]  Continuous Blood Gluc Receiver (FREESTYLE  LIBRE 2 READER) DEVI 1 each by Does not apply route daily. 10/25/21  Yes Carlus Pavlov, MD  Continuous Blood Gluc Sensor (FREESTYLE LIBRE 2 SENSOR) MISC USE 1 SENSOR EVERY 14 DAYS AS DIRECTED Patient taking differently: Inject 1 Device into the skin every 14 (fourteen) days. 09/30/22  Yes Carlus Pavlov, MD  hydrochlorothiazide (MICROZIDE) 12.5 MG capsule Take 1 capsule (12.5 mg total) by mouth daily. 05/28/22  Yes [name], NP  insulin aspart (NOVOLOG FLEXPEN) 100 UNIT/ML FlexPen Inject 5-7 units under skin up to 2x a day Patient taking differently: Inject 7-9 Units into the skin See admin instructions. Inject 7-9 into the skin twice a day 15 minutes before meals 05/06/22  Yes Carlus Pavlov, MD  Insulin Glargine (BASAGLAR KWIKPEN) 100 UNIT/ML Inject 22 Units into the skin daily. Patient taking differently: Inject 22 Units into the skin in the morning. 05/06/22  Yes Carlus Pavlov, MD  lisinopril (ZESTRIL) 20 MG tablet Take 1 tablet (20 mg total) by mouth daily. Follow-up appt due in July Patient taking differently: Take 20 mg by mouth daily. 11/17/22  Yes [name], NP  ondansetron (ZOFRAN) 4 MG tablet Take 1 tablet (4 mg total) by mouth every 8 (eight) hours as needed for nausea or vomiting. Patient taking differently: Take 4 mg by mouth 2 (two) times daily as needed (for extreme nausea caused by Augmentin). 12/26/22  Yes [name], NP  oxyCODONE (OXY IR/ROXICODONE) 5 MG immediate release tablet Take 0.5-1 tablets (2.5-5 mg total) by mouth every 4 (four) hours as needed for moderate pain. 12/24/22  Yes Leatha Gilding, MD  pravastatin (PRAVACHOL) 40 MG tablet Take 1 tablet (40 mg total) by mouth daily. Patient taking differently: Take 40 mg by mouth at bedtime. 08/25/22  Yes [name], NP  Semaglutide,0.25 or 0.5MG /DOS, 2 MG/3ML SOPN Inject 0.25-0.5 mg into the skin once a week. Patient taking differently: Inject 0.5 mg into the skin every Friday. 11/06/22  Yes Carlus Pavlov, MD  SYSTANE HYDRATION PF 0.4-0.3 % SOLN Place 1 drop into both eyes 3 (three) times daily as needed (for dryness or irritation).   Yes [provider]  Insulin Pen Needle 32G X 4 MM MISC Use 4x a day Patient taking differently: 1 each by Other route See admin instructions. Use 4x a day 09/13/21   Carlus Pavlov, MD  lidocaine (LIDODERM) 5 % PLACE 1 PATCH ONTO THE SKIN DAILY. REMOVE& DISCARD [facility] WITHIN 12 HOURS Patient not taking: Reported on 12/30/2022 08/22/21   Kathryne Hitch, MD    Current Facility-Administered Medications  Medication Dose Route Frequency Provider Last Rate Last Admin   0.9 %  sodium chloride infusion   Intravenous Continuous Lewie Chamber, MD 75 mL/hr at 12/30/22 2300 New Bag/Given (Non-Interop) at 12/30/22 2300   alum & mag hydroxide-simeth (MAALOX/MYLANTA) 200-200-20 MG/5ML suspension 30 mL  30 mL Oral Q4H PRN Lewie Chamber, MD   30 mL at 12/30/22 2242   amLODipine (NORVASC) tablet 10 mg  10 mg Oral Daily Lewie Chamber, MD       cefTRIAXone (ROCEPHIN) 2 g in sodium chloride 0.9 % 100 mL IVPB  2 g Intravenous Daily Lewie Chamber, MD  Stopped at 12/30/22 1926   heparin injection 5,000 Units  5,000 Units Subcutaneous Eliezer Lofts, MD   5,000 Units at 12/30/22 2222   hydrALAZINE (APRESOLINE) injection 10 mg  10 mg Intravenous Q4H PRN Lewie Chamber, MD       insulin aspart (novoLOG) injection 0-15 Units  0-15 Units Subcutaneous TID WC Lewie Chamber, MD       insulin aspart (novoLOG) injection 0-5 Units  0-5 Units Subcutaneous QHS Lewie Chamber, MD       insulin glargine-yfgn [facility]) injection 10 Units  10 Units Subcutaneous Daily Lewie Chamber, MD       labetalol (NORMODYNE) injection 10 mg  10 mg Intravenous Q4H PRN Lewie Chamber, MD       lisinopril (ZESTRIL) tablet 20 mg  20 mg Oral Daily Lewie Chamber, MD       LORazepam (ATIVAN) injection 0.25 mg  0.25 mg Intravenous Once PRN Luiz Iron, NP       ondansetron [facility])  tablet 4 mg  4 mg Oral Q6H PRN Lewie Chamber, MD       Or   ondansetron [facility]) injection 4 mg  4 mg Intravenous Q6H PRN Lewie Chamber, MD       oxyCODONE (Oxy IR/ROXICODONE) immediate release tablet 2.5-5 mg  2.5-5 mg Oral Q4H PRN Lewie Chamber, MD       sodium chloride flush (NS) 0.9 % injection 3 mL  3 mL Intravenous Orlene Erm, MD   3 mL at 12/30/22 2223    Allergies as of 12/30/2022 - Review Complete 12/30/2022  Allergen Reaction Noted   Augmentin [amoxicillin-pot clavulanate] Nausea Only and Other (See Comments) 12/30/2022   Lidoderm [lidocaine] Other (See Comments) 12/30/2022    Review of Systems  Constitutional:  Positive for weight loss. Negative for chills and fever.  HENT:  Negative for hearing loss and tinnitus.   Eyes:  Negative for blurred vision and double vision.  Respiratory:  Negative for cough and hemoptysis.   Cardiovascular:  Negative for chest pain and palpitations.  Gastrointestinal:  Negative for abdominal pain, blood in stool, constipation, diarrhea, heartburn, melena, nausea and vomiting.  Genitourinary:  Negative for dysuria and urgency.  Musculoskeletal:  Negative for myalgias and neck pain.  Skin:  Negative for itching and rash.  Neurological:  Negative for seizures and loss of consciousness.  Psychiatric/Behavioral:  Negative for depression and suicidal ideas.        Physical Exam:  Vital signs in last 24 hours: Temp:  [98.8 F (37.1 C)-99.1 F (37.3 C)] 98.9 F (37.2 C) (05/29 0344) Pulse Rate:  [65-85] 74 (05/29 0344) Resp:  [16-18] 18 (05/29 0344) BP: (139-160)/(64-76) 139/75 (05/29 0344) SpO2:  [94 %-98 %] 96 % (05/29 0344) Weight:  [10.3 kg-100.7 kg] 100.7 kg (05/28 1550) Last BM Date : 12/30/22 Last BM recorded by nurses in past 5 days No data recorded  Physical Exam Constitutional:      Appearance: Normal appearance. She is obese.  HENT:     Nose: Nose normal. No congestion.     Mouth/Throat:     Mouth: Mucous membranes  are moist.     Pharynx: Oropharynx is clear.  Eyes:     General: Scleral icterus present.     Extraocular Movements: Extraocular movements intact.  Cardiovascular:     Rate and Rhythm: Normal rate and regular rhythm.  Pulmonary:     Effort: Pulmonary effort is normal. No respiratory distress.  Abdominal:     General: Abdomen is  flat. Bowel sounds are normal. There is no distension.     Palpations: Abdomen is soft. There is no mass.     Tenderness: There is no abdominal tenderness. There is no guarding or rebound.     Hernia: No hernia is present.  Musculoskeletal:        General: No swelling. Normal range of motion.     Cervical back: Normal range of motion and neck supple.  Skin:    General: Skin is warm and dry.  Neurological:     General: No focal deficit present.     Mental Status: She is oriented to person, place, and time.  Psychiatric:        Mood and Affect: Mood normal.        Behavior: Behavior normal.        Thought Content: Thought content normal.        Judgment: Judgment normal.      LAB RESULTS: Recent Labs    12/30/22 1210 12/31/22 0559  WBC 9.5 8.4  HGB 14.7 13.0  HCT 43.5 39.1  PLT 295 256   BMET Recent Labs    12/30/22 1210 12/31/22 0559  NA 137 136  K 3.8 3.1*  CL 96* 98  CO2 33* 29  GLUCOSE 148* 104*  BUN 9 8  CREATININE 1.02* 0.71  CALCIUM 8.7* 8.0*   LFT Recent Labs    12/31/22 0559  PROT 6.3*  ALBUMIN 2.5*  AST 69*  ALT 54*  ALKPHOS 122  BILITOT 7.6*   PT/INR No results for input(s): "LABPROT", "INR" in the last 72 hours.  STUDIES: US THYROID  Result Date: 12/31/2022 CLINICAL DATA:  Incidental on CT. EXAM: THYROID ULTRASOUND TECHNIQUE: Ultrasound examination of the thyroid gland and adjacent soft tissues was performed. COMPARISON:  None Available. FINDINGS: Parenchymal Echotexture: Moderately heterogenous Isthmus: 1.0 cm Right lobe: 5.7 x 2.7 x 3.1 cm Left lobe: 4.3 x 1.7 x 2.3 cm  _________________________________________________________ Estimated total number of nodules >/= 1 cm: 3 Number of spongiform nodules >/=  2 cm not described below (TR1): 0 Number of mixed cystic and solid nodules >/= 1.5 cm not described below (TR2): 0 _________________________________________________________ Nodule # 1: Isoechoic ill-defined solid nodule in the right mid gland measures 2.1 x 1.9 x 1.6 cm. No evidence of calcifications. Findings are consistent with TI-RADS category 3. *Given size (>/= 1.5 - 2.4 cm) and appearance, a follow-up ultrasound in 1 year should be considered based on TI-RADS criteria. Nodule # 2: Smaller 1.4 cm isoechoic solid nodule versus pseudo nodule in the right inferior gland. Given size (<1.4 cm) and appearance, this nodule does NOT meet TI-RADS criteria for biopsy or dedicated follow-up. Nodule # 3: Ill-defined 1.3 cm hypoechoic solid nodule in the left inferior gland. TI-RADS category 4. *Given size (>/= 1 - 1.4 cm) and appearance, a follow-up ultrasound in 1 year should be considered based on TI-RADS criteria. IMPRESSION: 1. Enlarged, heterogeneous and multinodular thyroid gland. 2. Nodule # 1 is a 2.1 cm TI-RADS category 3 nodule in the right mid gland meets criteria for imaging surveillance. Recommend follow-up ultrasound in 1 year. 3. Nodule # 3 is a 1.3 cm TI-RADS category 4 nodule in the left inferior gland and also meets criteria for imaging surveillance. The above is in keeping with the ACR TI-RADS recommendations - J Am Coll Radiol 2017;14:587-595. Electronically Signed   By: Malachy Moan M.D.   On: 12/31/2022 06:55      Impression    Painless Jaundice -  t bili 7.6 (9.0) -AST 69/ALT 54/alk phos 122, improving -No leukocytosis - Repeat MRCP ordered -MRCP 5/20: 3 liver lesions, bulky lymphadenopathy in hepatoduodenal ligament, cholelithiasis and gallbladder wall thickening with mural edema.  No choledocholithiasis.  -CEA 18.9 -AFP 10.2 - liver biopsy  negative for malignant cells Previous imaging with mets in lung, liver, and abdomen. No biliary duct dilation. At that time bilirubin was 2-3. Yesterday elevated to 9.0 with improvement to 7.6 today. Alk phos has normalized. Repeat MRCP ordered. Suspect there is a possibility with improvement in obstructive LFTs that patient could've passed a stone. However, interestingly patient has had no associated pain or other symptoms.  Type 2 diabetes   Plan   - Await MRCP results - If MRCP is positive, will proceed with ERCP. If bilirubin does not improve, can consider ERCP with stent placement. Although bilirubin improving compared to yesterday - Continue supportive care - If no role for ERCP, oncology can move forward with repeat liver bx. - continue to trend LFTs  Thank you for your kind consultation, we will continue to follow.  Atilla Zollner Leanna Sato  12/31/2022, 8:15 AM

## 2022-12-31 NOTE — Progress Notes (Signed)
Regina Mckay   DOB:October 01, 1950   ZO#:[id number]   [id number]#:[id number]  Medical oncology follow-up note  Subjective: I met patient in my office first time yesterday, and admitted her to hospital after the visit to due to her hyperbilirubinemia.  No significant change today.  Total bilirubin came down from 9 yesterday to 7.6 this morning.   Objective:  Vitals:   12/31/22 0344 12/31/22 1351  BP: 139/75 (!) 158/70  Pulse: 74 78  Resp: 18 18  Temp: 98.9 F (37.2 C) 97.9 F (36.6 C)  SpO2: 96% 96%    Body mass index is 39.33 kg/m.  Intake/Output Summary (Last 24 hours) at 12/31/2022 1746 Last data filed at 12/31/2022 1042 Gross per 24 hour  Intake 705.35 ml  Output --  Net 705.35 ml     Sclerae unicteric  Oropharynx clear  No peripheral adenopathy  Lungs clear -- no rales or rhonchi  Heart regular rate and rhythm  Abdomen benign  MSK no focal spinal tenderness, no peripheral edema  Neuro nonfocal   CBG (last 3)  Recent Labs    12/31/22 0745 12/31/22 1152 12/31/22 1627  GLUCAP 103* 110* 174*     Labs:    Urine Studies No results for input(s): "UHGB", "CRYS" in the last 72 hours.  Invalid input(s): "UACOL", "UAPR", "USPG", "UPH", "UTP", "UGL", "UKET", "UBIL", "UNIT", "UROB", "ULEU", "UEPI", "UWBC", "URBC", "UBAC", "CAST", "UCOM", "BILUA"  Basic Metabolic Panel: Recent Labs  Lab 12/30/22 1210 12/31/22 0559  NA 137 136  K 3.8 3.1*  CL 96* 98  CO2 33* 29  GLUCOSE 148* 104*  BUN 9 8  CREATININE 1.02* 0.71  CALCIUM 8.7* 8.0*  MG  --  1.6*   GFR Estimated Creatinine Clearance: 70.9 mL/min (by C-G formula based on SCr of 0.71 mg/dL). Liver Function Tests: Recent Labs  Lab 12/30/22 1210 12/31/22 0559  AST 74* 69*  ALT 62* 54*  ALKPHOS 165* 122  BILITOT 9.0* 7.6*  PROT 7.0 6.3*  ALBUMIN 3.5 2.5*   No results for input(s): "LIPASE", "AMYLASE" in the last 168 hours. No results for input(s): "AMMONIA" in the last 168 hours. Coagulation profile No results for  input(s): "INR", "PROTIME" in the last 168 hours.  CBC: Recent Labs  Lab 12/30/22 1210 12/31/22 0559  WBC 9.5 8.4  NEUTROABS 7.4 6.4  HGB 14.7 13.0  HCT 43.5 39.1  MCV 86.5 87.5  PLT 295 256   Cardiac Enzymes: No results for input(s): "CKTOTAL", "CKMB", "CKMBINDEX", "TROPONINI" in the last 168 hours. BNP: Invalid input(s): "POCBNP" CBG: Recent Labs  Lab 12/30/22 1710 12/30/22 2126 12/31/22 0745 12/31/22 1152 12/31/22 1627  GLUCAP 168* 172* 103* 110* 174*   D-Dimer No results for input(s): "DDIMER" in the last 72 hours. Hgb A1c No results for input(s): "HGBA1C" in the last 72 hours. Lipid Profile No results for input(s): "CHOL", "HDL", "LDLCALC", "TRIG", "CHOLHDL", "LDLDIRECT" in the last 72 hours. Thyroid function studies No results for input(s): "TSH", "T4TOTAL", "T3FREE", "THYROIDAB" in the last 72 hours.  Invalid input(s): "FREET3" Anemia work up No results for input(s): "VITAMINB12", "FOLATE", "FERRITIN", "TIBC", "IRON", "RETICCTPCT" in the last 72 hours. Microbiology Recent Results (from the past 240 hour(s))  C Difficile Quick Screen w PCR reflex     Status: None   Collection Time: 12/21/22  5:51 PM   Specimen: Stool  Result Value Ref Range Status   C Diff antigen NEGATIVE NEGATIVE Final   C Diff toxin NEGATIVE NEGATIVE Final   C Diff interpretation No C.  difficile detected.  Final    Comment: Performed at [facility], [address] [place], [place] [address]  Gastrointestinal Panel by PCR , Stool     Status: None   Collection Time: 12/21/22  5:51 PM   Specimen: Stool  Result Value Ref Range Status   Campylobacter species NOT DETECTED NOT DETECTED Final   Plesimonas shigelloides NOT DETECTED NOT DETECTED Final   Salmonella species NOT DETECTED NOT DETECTED Final   Yersinia enterocolitica NOT DETECTED NOT DETECTED Final   Vibrio species NOT DETECTED NOT DETECTED Final   Vibrio cholerae NOT DETECTED NOT DETECTED Final    Enteroaggregative E coli (EAEC) NOT DETECTED NOT DETECTED Final   Enteropathogenic E coli (EPEC) NOT DETECTED NOT DETECTED Final   Enterotoxigenic E coli (ETEC) NOT DETECTED NOT DETECTED Final   Shiga like toxin producing E coli (STEC) NOT DETECTED NOT DETECTED Final   Shigella/Enteroinvasive E coli (EIEC) NOT DETECTED NOT DETECTED Final   Cryptosporidium NOT DETECTED NOT DETECTED Final   Cyclospora cayetanensis NOT DETECTED NOT DETECTED Final   Entamoeba histolytica NOT DETECTED NOT DETECTED Final   Giardia lamblia NOT DETECTED NOT DETECTED Final   Adenovirus F40/41 NOT DETECTED NOT DETECTED Final   Astrovirus NOT DETECTED NOT DETECTED Final   Norovirus GI/GII NOT DETECTED NOT DETECTED Final   Rotavirus A NOT DETECTED NOT DETECTED Final   Sapovirus (I, II, IV, and V) NOT DETECTED NOT DETECTED Final    Comment: Performed at [facility], [address] [place], [place] [address]      Studies:  MR 3D Recon At Scanner  Result Date: 12/31/2022 : CLINICAL DATA: Elevated liver enzymes, multiple liver lesions recently biopsied EXAM: MRI ABDOMEN WITHOUT AND WITH CONTRAST (INCLUDING MRCP) TECHNIQUE: Multiplanar multisequence MR imaging of the abdomen was performed both before and after the administration of intravenous contrast. Heavily T2-weighted images of the biliary and pancreatic ducts were obtained, and three-dimensional MRCP images were rendered by post processing. CONTRAST: 10mL GADAVIST GADOBUTROL 1 MMOL/ML IV SOLN COMPARISON: 12/22/2022 FINDINGS: Lower chest: No acute abnormality. Hepatobiliary: Multiple hypoenhancing liver lesions are again seen, the largest within the peripheral liver dome, hepatic segments VII and VIII (series 18, image 11). There are multiple additional smaller hypoenhancing lesions scattered throughout the liver, predominantly in the inferior right lobe, not described by prior examination but unchanged (series 18, image 38, 25). Small gallstones (series 8,  image 20). No gallbladder wall thickening, or biliary dilatation. Pancreas: Unremarkable. No pancreatic ductal dilatation or surrounding inflammatory changes. Spleen: Normal in size without significant abnormality. Adrenals/Urinary Tract: Adrenal glands are unremarkable. Kidneys are normal, without renal calculi, solid lesion, or hydronephrosis. Stomach/Bowel: Stomach is within normal limits. No evidence of bowel wall thickening, distention, or inflammatory changes. Vascular/Lymphatic: No significant vascular findings are present. Unchanged bulky, matted gastrohepatic ligament, celiac axis, portacaval, retroperitoneal, and small bowel mesenteric lymph nodes. Other: No abdominal wall hernia or abnormality. Trace perihepatic ascites. Musculoskeletal: No acute or significant osseous findings. IMPRESSION: 1. Multiple hypoenhancing liver lesions are again seen, the largest within the peripheral liver dome, hepatic segments VII and VIII. There are multiple additional smaller hypoenhancing lesions scattered throughout the liver, predominantly in the inferior right lobe, not described by prior examination but unchanged. Findings remain most consistent with metastases. Correlate with biopsy findings. 2. Unchanged bulky, matted abdominal lymphadenopathy, consistent with nodal metastatic disease. 3. Cholelithiasis without evidence of acute cholecystitis. 4. No biliary ductal dilatation. 5. Trace perihepatic ascites. Electronically Signed   By: Jearld Lesch  M.D.   On: 12/31/2022 12:40   MR ABDOMEN MRCP W WO CONTAST  Result Date: 12/31/2022 CLINICAL DATA:  Elevated liver enzymes, multiple liver lesions recently biopsied EXAM: MRI ABDOMEN WITHOUT AND WITH CONTRAST (INCLUDING MRCP) TECHNIQUE: Multiplanar multisequence MR imaging of the abdomen was performed both before and after the administration of intravenous contrast. Heavily T2-weighted images of the biliary and pancreatic ducts were obtained, and three-dimensional MRCP  images were rendered by post processing. CONTRAST:  10mL GADAVIST GADOBUTROL 1 MMOL/ML IV SOLN COMPARISON:  12/22/2022 FINDINGS: Lower chest: No acute abnormality. Hepatobiliary: Multiple hypoenhancing liver lesions are again seen, the largest within the peripheral liver dome, hepatic segments VII and VIII (series 18, image 11). There are multiple additional smaller hypoenhancing lesions scattered throughout the liver, predominantly in the inferior right lobe, not described by prior examination but unchanged (series 18, image 38, 25). Small gallstones (series 8, image 20). No gallbladder wall thickening, or biliary dilatation. Pancreas: Unremarkable. No pancreatic ductal dilatation or surrounding inflammatory changes. Spleen: Normal in size without significant abnormality. Adrenals/Urinary Tract: Adrenal glands are unremarkable. Kidneys are normal, without renal calculi, solid lesion, or hydronephrosis. Stomach/Bowel: Stomach is within normal limits. No evidence of bowel wall thickening, distention, or inflammatory changes. Vascular/Lymphatic: No significant vascular findings are present. Unchanged bulky, matted gastrohepatic ligament, celiac axis, portacaval, retroperitoneal, and small bowel mesenteric lymph nodes. Other: No abdominal wall hernia or abnormality. Trace perihepatic ascites. Musculoskeletal: No acute or significant osseous findings. IMPRESSION: 1. Multiple hypoenhancing liver lesions are again seen, the largest within the peripheral liver dome, hepatic segments VII and VIII. There are multiple additional smaller hypoenhancing lesions scattered throughout the liver, predominantly in the inferior right lobe, not described by prior examination but unchanged. Findings remain most consistent with metastases. Correlate with biopsy findings. 2. Unchanged bulky, matted abdominal lymphadenopathy, consistent with nodal metastatic disease. 3. Cholelithiasis without evidence of acute cholecystitis. 4. No biliary  ductal dilatation. 5. Trace perihepatic ascites. Electronically Signed   By: Jearld Lesch M.D.   On: 12/31/2022 12:18   US THYROID  Result Date: 12/31/2022 CLINICAL DATA:  Incidental on CT. EXAM: THYROID ULTRASOUND TECHNIQUE: Ultrasound examination of the thyroid gland and adjacent soft tissues was performed. COMPARISON:  None Available. FINDINGS: Parenchymal Echotexture: Moderately heterogenous Isthmus: 1.0 cm Right lobe: 5.7 x 2.7 x 3.1 cm Left lobe: 4.3 x 1.7 x 2.3 cm _________________________________________________________ Estimated total number of nodules >/= 1 cm: 3 Number of spongiform nodules >/=  2 cm not described below (TR1): 0 Number of mixed cystic and solid nodules >/= 1.5 cm not described below (TR2): 0 _________________________________________________________ Nodule # 1: Isoechoic ill-defined solid nodule in the right mid gland measures 2.1 x 1.9 x 1.6 cm. No evidence of calcifications. Findings are consistent with TI-RADS category 3. *Given size (>/= 1.5 - 2.4 cm) and appearance, a follow-up ultrasound in 1 year should be considered based on TI-RADS criteria. Nodule # 2: Smaller 1.4 cm isoechoic solid nodule versus pseudo nodule in the right inferior gland. Given size (<1.4 cm) and appearance, this nodule does NOT meet TI-RADS criteria for biopsy or dedicated follow-up. Nodule # 3: Ill-defined 1.3 cm hypoechoic solid nodule in the left inferior gland. TI-RADS category 4. *Given size (>/= 1 - 1.4 cm) and appearance, a follow-up ultrasound in 1 year should be considered based on TI-RADS criteria. IMPRESSION: 1. Enlarged, heterogeneous and multinodular thyroid gland. 2. Nodule # 1 is a 2.1 cm TI-RADS category 3 nodule in the right mid gland meets criteria for imaging surveillance.  Recommend follow-up ultrasound in 1 year. 3. Nodule # 3 is a 1.3 cm TI-RADS category 4 nodule in the left inferior gland and also meets criteria for imaging surveillance. The above is in keeping with the ACR TI-RADS  recommendations - J Am Coll Radiol 2017;14:587-595. Electronically Signed   By: Malachy Moan M.D.   On: 12/31/2022 06:55    Assessment: 72 y.o. 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  Cholelithiasis without evidence of acute cholecystitis.  No biliary ductal dilatation -Given the lack of biliary dilatation, ERCP is not recommended or needed.  The etiology of her hyperbilirubinemia is unclear, possible related to medication or her recent infection. -Case reviewed with IR Dr. Fredia Sorrow.  Given her negative liver biopsy, Dr. Fredia Sorrow does not recommend repeating liver biopsy.  He recommends lymph node biopsy.  Case discussed with GI Dr. Meridee Score and he will try EUS biopsy of her abdominal lymph node, hopefully tomorrow or Friday. -I will f/u on Friday.    Malachy Mood, MD 12/31/2022  5:46 PM

## 2023-01-01 ENCOUNTER — Encounter (HOSPITAL_COMMUNITY): Payer: Self-pay | Admitting: Internal Medicine

## 2023-01-01 ENCOUNTER — Inpatient Hospital Stay (HOSPITAL_COMMUNITY): Payer: Medicare Other | Admitting: Anesthesiology

## 2023-01-01 ENCOUNTER — Encounter (HOSPITAL_COMMUNITY)
Admission: AD | Disposition: A | Discharge status: Home / Self Care | Payer: Self-pay | Source: Ambulatory Visit | Attending: Internal Medicine

## 2023-01-01 DIAGNOSIS — K859 Acute pancreatitis without necrosis or infection, unspecified: Secondary | ICD-10-CM

## 2023-01-01 DIAGNOSIS — R7989 Other specified abnormal findings of blood chemistry: Secondary | ICD-10-CM | POA: Diagnosis not present

## 2023-01-01 DIAGNOSIS — E041 Nontoxic single thyroid nodule: Secondary | ICD-10-CM | POA: Diagnosis not present

## 2023-01-01 DIAGNOSIS — K269 Duodenal ulcer, unspecified as acute or chronic, without hemorrhage or perforation: Secondary | ICD-10-CM | POA: Diagnosis not present

## 2023-01-01 DIAGNOSIS — K869 Disease of pancreas, unspecified: Secondary | ICD-10-CM | POA: Diagnosis not present

## 2023-01-01 DIAGNOSIS — K208 Other esophagitis without bleeding: Secondary | ICD-10-CM | POA: Diagnosis not present

## 2023-01-01 DIAGNOSIS — R932 Abnormal findings on diagnostic imaging of liver and biliary tract: Secondary | ICD-10-CM | POA: Diagnosis not present

## 2023-01-01 DIAGNOSIS — K449 Diaphragmatic hernia without obstruction or gangrene: Secondary | ICD-10-CM

## 2023-01-01 DIAGNOSIS — Z87891 Personal history of nicotine dependence: Secondary | ICD-10-CM

## 2023-01-01 DIAGNOSIS — I899 Noninfective disorder of lymphatic vessels and lymph nodes, unspecified: Secondary | ICD-10-CM

## 2023-01-01 DIAGNOSIS — R7881 Bacteremia: Secondary | ICD-10-CM | POA: Diagnosis not present

## 2023-01-01 DIAGNOSIS — K2950 Unspecified chronic gastritis without bleeding: Secondary | ICD-10-CM

## 2023-01-01 DIAGNOSIS — K838 Other specified diseases of biliary tract: Secondary | ICD-10-CM

## 2023-01-01 DIAGNOSIS — K2960 Other gastritis without bleeding: Secondary | ICD-10-CM

## 2023-01-01 DIAGNOSIS — I10 Essential (primary) hypertension: Secondary | ICD-10-CM

## 2023-01-01 DIAGNOSIS — E119 Type 2 diabetes mellitus without complications: Secondary | ICD-10-CM

## 2023-01-01 HISTORY — PX: FINE NEEDLE ASPIRATION: SHX5430

## 2023-01-01 HISTORY — PX: BIOPSY: SHX5522

## 2023-01-01 HISTORY — PX: ESOPHAGOGASTRODUODENOSCOPY (EGD) WITH PROPOFOL: SHX5813

## 2023-01-01 HISTORY — PX: EUS: SHX5427

## 2023-01-01 LAB — COMPREHENSIVE METABOLIC PANEL
ALT: 48 U/L — ABNORMAL HIGH (ref 0–44)
AST: 59 U/L — ABNORMAL HIGH (ref 15–41)
Albumin: 2.4 g/dL — ABNORMAL LOW (ref 3.5–5.0)
Alkaline Phosphatase: 123 U/L (ref 38–126)
Anion gap: 8 (ref 5–15)
BUN: 7 mg/dL — ABNORMAL LOW (ref 8–23)
CO2: 27 mmol/L (ref 22–32)
Calcium: 7.9 mg/dL — ABNORMAL LOW (ref 8.9–10.3)
Chloride: 102 mmol/L (ref 98–111)
Creatinine, Ser: 0.58 mg/dL (ref 0.44–1.00)
GFR, Estimated: >60 mL/min (ref >60)
Glucose, Bld: 110 mg/dL — ABNORMAL HIGH (ref 70–99)
Potassium: 3.2 mmol/L — ABNORMAL LOW (ref 3.5–5.1)
Sodium: 137 mmol/L (ref 135–145)
Total Bilirubin: 7.5 mg/dL — ABNORMAL HIGH (ref 0.3–1.2)
Total Protein: 6 g/dL — ABNORMAL LOW (ref 6.5–8.1)

## 2023-01-01 LAB — CBC WITH DIFFERENTIAL/PLATELET
Abs Immature Granulocytes: 0.03 10*3/uL (ref 0.00–0.07)
Basophils Absolute: 0 10*3/uL (ref 0.0–0.1)
Basophils Relative: 0 %
Eosinophils Absolute: 0.1 10*3/uL (ref 0.0–0.5)
Eosinophils Relative: 1 %
HCT: 37.1 % (ref 36.0–46.0)
Hemoglobin: 12.3 g/dL (ref 12.0–15.0)
Immature Granulocytes: 0 %
Lymphocytes Relative: 11 %
Lymphs Abs: 0.9 10*3/uL (ref 0.7–4.0)
MCH: 29.4 pg (ref 26.0–34.0)
MCHC: 33.2 g/dL (ref 30.0–36.0)
MCV: 88.8 fL (ref 80.0–100.0)
Monocytes Absolute: 0.8 10*3/uL (ref 0.1–1.0)
Monocytes Relative: 10 %
Neutro Abs: 6.2 10*3/uL (ref 1.7–7.7)
Neutrophils Relative %: 78 %
Platelets: 234 10*3/uL (ref 150–400)
RBC: 4.18 MIL/uL (ref 3.87–5.11)
RDW: 14.8 % (ref 11.5–15.5)
WBC: 8 10*3/uL (ref 4.0–10.5)
nRBC: 0 % (ref 0.0–0.2)

## 2023-01-01 LAB — GLUCOSE, CAPILLARY
Glucose-Capillary: 109 mg/dL — ABNORMAL HIGH (ref 70–99)
Glucose-Capillary: 109 mg/dL — ABNORMAL HIGH (ref 70–99)
Glucose-Capillary: 87 mg/dL (ref 70–99)
Glucose-Capillary: 80 mg/dL (ref 70–99)
Glucose-Capillary: 120 mg/dL — ABNORMAL HIGH (ref 70–99)

## 2023-01-01 LAB — BILIRUBIN, DIRECT: Bilirubin, Direct: 4.5 mg/dL — ABNORMAL HIGH (ref 0.0–0.2)

## 2023-01-01 LAB — IGG 4: IgG, Subclass 4: 20 mg/dL (ref 2–96)

## 2023-01-01 LAB — MAGNESIUM: Magnesium: 1.8 mg/dL (ref 1.7–2.4)

## 2023-01-01 SURGERY — UPPER ENDOSCOPIC ULTRASOUND (EUS) LINEAR
Anesthesia: General

## 2023-01-01 MED ORDER — PANTOPRAZOLE SODIUM 40 MG PO TBEC
40.0000 mg | DELAYED_RELEASE_TABLET | Freq: Two times a day (BID) | ORAL | Status: DC
  Administered 2023-01-01 – 2023-01-02 (×2): 40 mg via ORAL
  Filled 2023-01-01 (×2): qty 1

## 2023-01-01 MED ORDER — ONDANSETRON HCL 4 MG/2ML IJ SOLN
INTRAMUSCULAR | Status: AC
  Filled 2023-01-01: qty 2

## 2023-01-01 MED ORDER — LACTATED RINGERS IV SOLN: INTRAVENOUS | Status: DC

## 2023-01-01 MED ORDER — FENTANYL CITRATE (PF) 250 MCG/5ML IJ SOLN
INTRAMUSCULAR | Status: AC
  Filled 2023-01-01: qty 5

## 2023-01-01 MED ORDER — PROPOFOL 10 MG/ML IV BOLUS
INTRAVENOUS | Status: AC
  Filled 2023-01-01: qty 20

## 2023-01-01 MED ORDER — SUCRALFATE 1 G PO TABS
1.0000 g | ORAL_TABLET | Freq: Three times a day (TID) | ORAL | Status: DC
  Administered 2023-01-01 – 2023-01-02 (×2): 1 g via ORAL
  Filled 2023-01-01 (×2): qty 1

## 2023-01-01 MED ORDER — SODIUM CHLORIDE 0.9 % IV SOLN: INTRAVENOUS | Status: DC

## 2023-01-01 MED ORDER — HEPARIN SODIUM (PORCINE) 5000 UNIT/ML IJ SOLN: 5000.0000 [IU] | Freq: Three times a day (TID) | INTRAMUSCULAR | Status: DC

## 2023-01-01 MED ORDER — PROPOFOL 10 MG/ML IV BOLUS
INTRAVENOUS | Status: DC | PRN
  Administered 2023-01-01: 10 mg via INTRAVENOUS
  Administered 2023-01-01 (×2): 20 mg via INTRAVENOUS
  Administered 2023-01-01: 50 mg via INTRAVENOUS

## 2023-01-01 MED ORDER — PROPOFOL 500 MG/50ML IV EMUL
INTRAVENOUS | Status: DC | PRN
  Administered 2023-01-01: 100 ug/kg/min via INTRAVENOUS

## 2023-01-01 MED ORDER — ENSURE MAX PROTEIN PO LIQD
11.0000 [oz_av] | Freq: Two times a day (BID) | ORAL | Status: DC
  Administered 2023-01-02: 11 [oz_av] via ORAL
  Filled 2023-01-01: qty 330

## 2023-01-01 MED ORDER — SODIUM CHLORIDE 0.9 % IV SOLN
2.0000 g | Freq: Every day | INTRAVENOUS | Status: DC
  Administered 2023-01-01: 2 g via INTRAVENOUS

## 2023-01-01 MED ORDER — LIDOCAINE 2% (20 MG/ML) 5 ML SYRINGE
INTRAMUSCULAR | Status: DC | PRN
  Administered 2023-01-01: 40 mg via INTRAVENOUS

## 2023-01-01 NOTE — TOC CM/SW Note (Signed)
Transition of Care [facility]) - Inpatient Brief Assessment   Patient Details  Name: Regina Mckay MRN: [id number] Date of Birth: [birth year]  Transition of Care [facility]) CM/SW Contact:    Coralyn Helling, LCSW Phone Number: 01/01/2023, 9:44 AM   Clinical Narrative: Screened no needs anticipated. Please submit consult if needs arise.     Transition of Care Asessment: Insurance and Status: Insurance coverage has been reviewed Patient has primary care physician: Yes Home environment has been reviewed: From home alone. Grandaduaghter helps. Prior level of function:: Independent Prior/Current Home Services: No current home services Social Determinants of Health Reivew: SDOH reviewed no interventions necessary Readmission risk has been reviewed: Yes Transition of care needs: no transition of care needs at this time

## 2023-01-01 NOTE — Interval H&P Note (Signed)
History and Physical Interval Note:  01/01/2023 3:47 PM  Regina Mckay  has presented today for surgery, with the diagnosis of Lymphadenopathy needs sampling.  The various methods of treatment have been discussed with the patient and family. After consideration of risks, benefits and other options for treatment, the patient has consented to  Procedure(s): UPPER ENDOSCOPIC ULTRASOUND (EUS) LINEAR (N/A) as a surgical intervention.  The patient's history has been reviewed, patient examined, no change in status, stable for surgery.  I have reviewed the patient's chart and labs.  Questions were answered to the patient's satisfaction.    The risks of an EUS including intestinal perforation, bleeding, infection, aspiration, and medication effects were discussed as was the possibility it may not give a definitive diagnosis if a biopsy is performed.  When a biopsy of the pancreas is done as part of the EUS, there is an additional risk of pancreatitis at the rate of about 1-2%.  It was explained that procedure related pancreatitis is typically mild, although it can be severe and even life threatening, which is why we do not perform random pancreatic biopsies and only biopsy a lesion/area we feel is concerning enough to warrant the risk.    [employer]

## 2023-01-01 NOTE — Transfer of Care (Signed)
Immediate Anesthesia Transfer of Care Note  Patient: LAVELLA SKRINE  Procedure(s) Performed: UPPER ENDOSCOPIC ULTRASOUND (EUS) LINEAR ESOPHAGOGASTRODUODENOSCOPY (EGD) WITH PROPOFOL BIOPSY FINE NEEDLE ASPIRATION (FNA) LINEAR  Patient Location: PACU  Anesthesia Type:MAC  Level of Consciousness: awake, alert , and oriented  Airway & Oxygen Therapy: Patient Spontanous Breathing and Patient connected to face mask oxygen  Post-op Assessment: Report given to RN and Post -op Vital signs reviewed and stable  Post vital signs: Reviewed and stable  Last Vitals:  Vitals Value Taken Time  BP 125/75 01/01/23 1715  Temp    Pulse 88 01/01/23 1715  Resp 19 01/01/23 1715  SpO2 97 % 01/01/23 1715  Vitals shown include unvalidated device data.  Last Pain:  Vitals:   01/01/23 1400  TempSrc: Temporal  PainSc:          Complications: No notable events documented.

## 2023-01-01 NOTE — Progress Notes (Signed)
Initial Nutrition Assessment  DOCUMENTATION CODES:   Obesity unspecified  INTERVENTION:   -Ensure MAX Protein po BID, each supplement provides 150 kcal and 30 grams of protein   NUTRITION DIAGNOSIS:   Increased nutrient needs related to acute illness as evidenced by estimated needs.  GOAL:   Patient will meet greater than or equal to 90% of their needs  MONITOR:   PO intake, Supplement acceptance, Labs, Weight trends, I & O's  REASON FOR ASSESSMENT:   Malnutrition Screening Tool    ASSESSMENT:   72 year old female with obesity, HTN, IDDM 2admitted to [facility] 5/18 - 5/22 with nausea, vomiting, diarrhea.She had imaging concerning for multiple lesions throughout the liver, abdomen, and lungs concerning for possible metastatic malignancy.  Patient in room, family at bedside. Pt NPO awaiting EUS with biopsy today. Pt states she has been eating and has had no issues eating at home.  She has been on Oxempic for 2 years which has initiated some of her weight loss. Pt notes her main issue is reflux, denies issues swallowing or chewing. Has noticed taste changes while on Oxempic.  Pt agreeable to Ensure once diet is advanced following procedure.  Per patient UBW is ~238 lbs Pet states she has lost 12 lbs since October 2023.   Medications: Lactated ringers  Labs reviewed:  CBGs: 87-174 Low K  NUTRITION - FOCUSED PHYSICAL EXAM:  Deferred  Diet Order:   Diet Order             Diet NPO time specified Except for: Sips with Meds  Diet effective midnight                   EDUCATION NEEDS:   No education needs have been identified at this time  Skin:  Skin Assessment: Reviewed RN Assessment  Last BM:  5/28  Height:   Ht Readings from Last 1 Encounters:  01/01/23 5\' 3"  (1.6 m)    Weight:   Wt Readings from Last 1 Encounters:  01/01/23 100.7 kg    BMI:  Body mass index is 39.33 kg/m.  Estimated Nutritional Needs:   Kcal:  1550-1750  Protein:   75-85g  Fluid:  1.7L/day  Tilda Franco, MS, RD, LDN Inpatient Clinical Dietitian Contact information available via Amion

## 2023-01-01 NOTE — Anesthesia Postprocedure Evaluation (Signed)
Anesthesia Post Note  Patient: Regina Mckay  Procedure(s) Performed: UPPER ENDOSCOPIC ULTRASOUND (EUS) LINEAR ESOPHAGOGASTRODUODENOSCOPY (EGD) WITH PROPOFOL BIOPSY FINE NEEDLE ASPIRATION (FNA) LINEAR     Patient location during evaluation: Endoscopy Anesthesia Type: MAC Level of consciousness: awake and alert Pain management: pain level controlled Vital Signs Assessment: post-procedure vital signs reviewed and stable Respiratory status: spontaneous breathing and respiratory function stable Cardiovascular status: stable Postop Assessment: no apparent nausea or vomiting Anesthetic complications: no  No notable events documented.  Last Vitals:  Vitals:   01/01/23 1730 01/01/23 1745  BP: (!) 116/58 (!) 120/35  Pulse: 77 75  Resp: 17 18  Temp:    SpO2: 95% 93%    Last Pain:  Vitals:   01/01/23 1745  TempSrc:   PainSc: 0-No pain                 Chelan Heringer DANIEL

## 2023-01-01 NOTE — Progress Notes (Signed)
[facility]                                                                              Regina Mckay, is a 72 y.o. female, DOB - [birth year], ZOX:[id number] Admit date - 12/30/2022    Outpatient Primary MD for the patient is [name], NP  LOS - 2  days  No chief complaint on file.      Brief summary   Patient is a 72 year old female with obesity, HTN, IDDM 2admitted to [facility] 5/18 - 5/22 with nausea, vomiting, diarrhea.  During that hospital stay, she had a Klebsiella bacteremia, seen by GI due to elevated bilirubin.  She was discharged with 14-day course of Augmentin. She had imaging concerning for multiple lesions throughout the liver, abdomen, and lungs concerning for possible metastatic malignancy.  She had followup with oncology on 5/28 and was found to have further markedly elevated TBi 9, and was referred to the hospital per oncology.    Assessment & Plan    Principal Problem: Elevated LFTs with hyperbilirubinemia -Recent workup during previous hospitalization, surgery recommended against HIDA scan due to low suspicion for acute cholecystitis.  Previous workup showed cholelithiasis with mild gallbladder wall thickening but no biliary dilatation or choledocholithiasis except metastasis disease. -LFTs, total bili improving -GI following.  MRCP showed multiple liver lesions, throughout the liver and bulky abdominal lymphadenopathy.  No biliary duct dilatation.  Cholelithiasis without evidence of acute cholecystitis. -Awaiting further recommendations from GI regarding EUS with biopsy, currently n.p.o.  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 Mosetta Putt, MRCP consistent with metastasis, bulky abdominal lymphadenopathy with nodal metastasis.  She had previously undergone liver biopsy however was negative for malignant cells. -Oncology following.  Possible EUS with  biopsy today otherwise tomorrow 5/31  Klebsiella pneumoniae bacteremia - suspected GI source; seen by ID last hospitalization as well. 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   Weight as of this encounter: 100.7 kg.  Code Status: Full code DVT Prophylaxis:  heparin injection 5,000 Units Start: 12/30/22 2200   Level of Care: Level of care: Med-Surg Family Communication: Updated patient's sister at the bedside Disposition Plan:      Remains  inpatient appropriate: Workup in progress   Procedures:  MRCP  Consultants:   Gastroenterology Oncology  Antimicrobials:   Anti-infectives (From admission, onward)    Start     Dose/Rate Route Frequency Ordered Stop   12/30/22 1800  cefTRIAXone (ROCEPHIN) 2 g in sodium chloride 0.9 % 100 mL IVPB        2 g 200 mL/hr over 30 Minutes Intravenous Daily 12/30/22 1630            Medications  amLODipine  10 mg Oral Daily   heparin  5,000 Units Subcutaneous Q8H   insulin aspart  0-15 Units Subcutaneous TID WC   insulin aspart  0-5 Units  Subcutaneous QHS   insulin glargine-yfgn  10 Units Subcutaneous Daily   lisinopril  20 mg Oral Daily   sodium chloride flush  3 mL Intravenous Q12H      Subjective:   Regina Mckay was seen and examined today.  No acute complaints, n.p.o., awaiting further evaluation today by GI.  No acute nausea vomiting, abdominal pain or fevers.  Sister at the bedside.    Objective:   Vitals:   12/31/22 2109 01/01/23 0404 01/01/23 1125 01/01/23 1126  BP: (!) 155/70 (!) 145/79 (!) 147/68 (!) 147/68  Pulse: 76 78 73   Resp: 20 18    Temp: 98.8 F (37.1 C) 98.6 F (37 C)    TempSrc: Oral Oral    SpO2: 99% 98% 97%   Weight:      Height:       No intake or output data in the 24 hours ending 01/01/23 1219    Wt Readings from Last 3 Encounters:  12/30/22 100.7 kg  12/30/22 10.3 kg  12/26/22 103.1 kg    Physical Exam General: Alert and oriented x 3, NAD Cardiovascular: S1 S2 clear, RRR.  Respiratory: CTAB, no wheezing Gastrointestinal: Soft, nontender, nondistended, NBS Ext: no pedal edema bilaterally Neuro: no new deficits Psych: Normal affect     Data Reviewed:  I have personally reviewed following labs    CBC Lab Results  Component Value Date   WBC 8.0 01/01/2023   RBC 4.18 01/01/2023   HGB 12.3 01/01/2023   HCT 37.1 01/01/2023   MCV 88.8 01/01/2023   MCH 29.4 01/01/2023   PLT 234 01/01/2023   MCHC 33.2 01/01/2023   RDW 14.8 01/01/2023   LYMPHSABS 0.9 01/01/2023   MONOABS 0.8 01/01/2023   EOSABS 0.1 01/01/2023   BASOSABS 0.0 01/01/2023     Last metabolic panel Lab Results  Component Value Date   NA 137 01/01/2023   K 3.2 (L) 01/01/2023   CL 102 01/01/2023   CO2 27 01/01/2023   BUN 7 (L) 01/01/2023   CREATININE 0.58 01/01/2023   GLUCOSE 110 (H) 01/01/2023   GFRNONAA >60 01/01/2023   GFRAA >60 05/17/2019   CALCIUM 7.9 (L) 01/01/2023   PHOS 2.2 (L) 12/24/2022   PROT 6.0 (L) 01/01/2023   ALBUMIN 2.4 (L) 01/01/2023   BILITOT 7.5 (H) 01/01/2023   ALKPHOS  123 01/01/2023   AST 59 (H) 01/01/2023   ALT 48 (H) 01/01/2023   ANIONGAP 8 01/01/2023    CBG (last 3)  Recent Labs    12/31/22 2139 01/01/23 0741 01/01/23 1126  GLUCAP 133* 109* 109*      Coagulation Profile: No results for input(s): "INR", "PROTIME" in the last 168 hours.   Radiology Studies: I have personally reviewed the imaging studies  MR 3D Recon At Scanner  Result Date: 12/31/2022 :  CLINICAL DATA: Elevated liver enzymes, multiple liver lesions recently biopsied EXAM: MRI ABDOMEN WITHOUT AND WITH CONTRAST (INCLUDING MRCP) TECHNIQUE: Multiplanar multisequence MR imaging of the abdomen was performed both before and after the administration of intravenous contrast. Heavily T2-weighted images of the biliary and pancreatic ducts were obtained, and three-dimensional MRCP images were rendered by post processing. CONTRAST: 10mL GADAVIST GADOBUTROL 1 MMOL/ML IV SOLN COMPARISON: 12/22/2022 FINDINGS: Lower chest: No acute abnormality. Hepatobiliary: Multiple hypoenhancing liver lesions are again seen, the largest within the peripheral liver dome, hepatic segments VII and VIII (series 18, image 11). There are multiple additional smaller hypoenhancing lesions scattered throughout the liver, predominantly in the inferior right lobe, not described by prior examination but unchanged (series 18, image 38, 25). Small gallstones (series 8, image 20). No gallbladder wall thickening, or biliary dilatation. Pancreas: Unremarkable. No pancreatic ductal dilatation or surrounding inflammatory changes. Spleen: Normal in size without significant abnormality. Adrenals/Urinary Tract: Adrenal glands are unremarkable. Kidneys are normal, without renal calculi, solid lesion, or hydronephrosis. Stomach/Bowel: Stomach is within normal limits. No evidence of bowel wall thickening, distention, or inflammatory changes. Vascular/Lymphatic: No significant vascular findings are present. Unchanged bulky, matted gastrohepatic  ligament, celiac axis, portacaval, retroperitoneal, and small bowel mesenteric lymph nodes. Other: No abdominal wall hernia or abnormality. Trace perihepatic ascites. Musculoskeletal: No acute or significant osseous findings. IMPRESSION: 1. Multiple hypoenhancing liver lesions are again seen, the largest within the peripheral liver dome, hepatic segments VII and VIII. There are multiple additional smaller hypoenhancing lesions scattered throughout the liver, predominantly in the inferior right lobe, not described by prior examination but unchanged. Findings remain most consistent with metastases. Correlate with biopsy findings. 2. Unchanged bulky, matted abdominal lymphadenopathy, consistent with nodal metastatic disease. 3. Cholelithiasis without evidence of acute cholecystitis. 4. No biliary ductal dilatation. 5. Trace perihepatic ascites. Electronically Signed   By: Jearld Lesch M.D.   On: 12/31/2022 12:40   MR ABDOMEN MRCP W WO CONTAST  Result Date: 12/31/2022 CLINICAL DATA:  Elevated liver enzymes, multiple liver lesions recently biopsied EXAM: MRI ABDOMEN WITHOUT AND WITH CONTRAST (INCLUDING MRCP) TECHNIQUE: Multiplanar multisequence MR imaging of the abdomen was performed both before and after the administration of intravenous contrast. Heavily T2-weighted images of the biliary and pancreatic ducts were obtained, and three-dimensional MRCP images were rendered by post processing. CONTRAST:  10mL GADAVIST GADOBUTROL 1 MMOL/ML IV SOLN COMPARISON:  12/22/2022 FINDINGS: Lower chest: No acute abnormality. Hepatobiliary: Multiple hypoenhancing liver lesions are again seen, the largest within the peripheral liver dome, hepatic segments VII and VIII (series 18, image 11). There are multiple additional smaller hypoenhancing lesions scattered throughout the liver, predominantly in the inferior right lobe, not described by prior examination but unchanged (series 18, image 38, 25). Small gallstones (series 8, image  20). No gallbladder wall thickening, or biliary dilatation. Pancreas: Unremarkable. No pancreatic ductal dilatation or surrounding inflammatory changes. Spleen: Normal in size without significant abnormality. Adrenals/Urinary Tract: Adrenal glands are unremarkable. Kidneys are normal, without renal calculi, solid lesion, or hydronephrosis. Stomach/Bowel: Stomach is within normal limits. No evidence of bowel wall thickening, distention, or inflammatory changes. Vascular/Lymphatic: No significant vascular findings are present. Unchanged bulky, matted gastrohepatic ligament, celiac axis, portacaval, retroperitoneal, and small bowel mesenteric lymph nodes. Other: No abdominal wall hernia or abnormality. Trace perihepatic ascites. Musculoskeletal: No acute or significant osseous findings. IMPRESSION: 1. Multiple hypoenhancing liver lesions are again seen, the largest within the peripheral liver dome, hepatic segments VII and VIII. There are multiple additional smaller hypoenhancing lesions scattered throughout the  liver, predominantly in the inferior right lobe, not described by prior examination but unchanged. Findings remain most consistent with metastases. Correlate with biopsy findings. 2. Unchanged bulky, matted abdominal lymphadenopathy, consistent with nodal metastatic disease. 3. Cholelithiasis without evidence of acute cholecystitis. 4. No biliary ductal dilatation. 5. Trace perihepatic ascites. Electronically Signed   By: Jearld Lesch M.D.   On: 12/31/2022 12:18   US THYROID  Result Date: 12/31/2022 CLINICAL DATA:  Incidental on CT. EXAM: THYROID ULTRASOUND TECHNIQUE: Ultrasound examination of the thyroid gland and adjacent soft tissues was performed. COMPARISON:  None Available. FINDINGS: Parenchymal Echotexture: Moderately heterogenous Isthmus: 1.0 cm Right lobe: 5.7 x 2.7 x 3.1 cm Left lobe: 4.3 x 1.7 x 2.3 cm _________________________________________________________ Estimated total number of nodules  >/= 1 cm: 3 Number of spongiform nodules >/=  2 cm not described below (TR1): 0 Number of mixed cystic and solid nodules >/= 1.5 cm not described below (TR2): 0 _________________________________________________________ Nodule # 1: Isoechoic ill-defined solid nodule in the right mid gland measures 2.1 x 1.9 x 1.6 cm. No evidence of calcifications. Findings are consistent with TI-RADS category 3. *Given size (>/= 1.5 - 2.4 cm) and appearance, a follow-up ultrasound in 1 year should be considered based on TI-RADS criteria. Nodule # 2: Smaller 1.4 cm isoechoic solid nodule versus pseudo nodule in the right inferior gland. Given size (<1.4 cm) and appearance, this nodule does NOT meet TI-RADS criteria for biopsy or dedicated follow-up. Nodule # 3: Ill-defined 1.3 cm hypoechoic solid nodule in the left inferior gland. TI-RADS category 4. *Given size (>/= 1 - 1.4 cm) and appearance, a follow-up ultrasound in 1 year should be considered based on TI-RADS criteria. IMPRESSION: 1. Enlarged, heterogeneous and multinodular thyroid gland. 2. Nodule # 1 is a 2.1 cm TI-RADS category 3 nodule in the right mid gland meets criteria for imaging surveillance. Recommend follow-up ultrasound in 1 year. 3. Nodule # 3 is a 1.3 cm TI-RADS category 4 nodule in the left inferior gland and also meets criteria for imaging surveillance. The above is in keeping with the ACR TI-RADS recommendations - J Am Coll Radiol 2017;14:587-595. Electronically Signed   By: Malachy Moan M.D.   On: 12/31/2022 06:55       Michaelpaul Apo M.D. [facility] 01/01/2023, 12:19 PM  Available via Epic secure chat 7am-7pm After 7 pm, please refer to night coverage provider listed on amion.

## 2023-01-01 NOTE — Anesthesia Preprocedure Evaluation (Addendum)
Anesthesia Evaluation  Patient identified by MRN, date of birth, ID band Patient awake    Reviewed: Allergy & Precautions, NPO status , Patient's Chart, lab work & pertinent test results, Unable to perform ROS - Chart review only  Airway Mallampati: III  TM Distance: >3 FB Neck ROM: Full    Dental no notable dental hx. (+) Dental Advisory Given   Pulmonary former smoker   Pulmonary exam normal        Cardiovascular hypertension, Pt. on medications Normal cardiovascular exam     Neuro/Psych  PSYCHIATRIC DISORDERS  Depression    negative neurological ROS     GI/Hepatic negative GI ROS,,,(+) Hepatitis -  Endo/Other  diabetes  Morbid obesity  Renal/GU negative Renal ROS     Musculoskeletal negative musculoskeletal ROS (+)    Abdominal   Peds  Hematology negative hematology ROS (+)   Anesthesia Other Findings   Reproductive/Obstetrics                             Anesthesia Physical Anesthesia Plan  ASA: 3  Anesthesia Plan: MAC   Post-op Pain Management: Minimal or no pain anticipated   Induction:   PONV Risk Score and Plan: 2 and Treatment may vary due to age or medical condition, Ondansetron and Propofol infusion  Airway Management Planned: Natural Airway and Simple Face Mask  Additional Equipment:   Intra-op Plan:   Post-operative Plan:   Informed Consent: I have reviewed the patients History and Physical, chart, labs and discussed the procedure including the risks, benefits and alternatives for the proposed anesthesia with the patient or authorized representative who has indicated his/her understanding and acceptance.     Dental advisory given  Plan Discussed with: CRNA and Anesthesiologist  Anesthesia Plan Comments:         Anesthesia Quick Evaluation

## 2023-01-01 NOTE — Op Note (Signed)
[facility] Patient Name: Regina Mckay Procedure Date: 01/01/2023 MRN: [id number] Attending MD: Corliss Parish , MD, [id number] Date of Birth: 1950/11/03 CSN: [id number] Age: 72 Admit Type: Inpatient Procedure:                Upper EUS Indications:              Lymphadenopathy on CT scan, Lymphadenopathy on MRCP Providers:                Corliss Parish, MD, Fransisca Connors, Beryle Beams, Technician, Ponciano Ort, CRNA Referring MD:             Malachy Mood, Inpatient Medical Service Medicines:                Monitored Anesthesia Care Complications:            No immediate complications. Estimated Blood Loss:     Estimated blood loss was minimal. Procedure:                Pre-Anesthesia Assessment:                           - Prior to the procedure, a History and Physical                            was performed, and patient medications and                            allergies were reviewed. The patient's tolerance of                            previous anesthesia was also reviewed. The risks                            and benefits of the procedure and the sedation                            options and risks were discussed with the patient.                            All questions were answered, and informed consent                            was obtained. Prior Anticoagulants: The patient has                            taken heparin, last dose was day of procedure. ASA                            Grade Assessment: III - A patient with severe                            systemic disease. After reviewing the risks and  benefits, the patient was deemed in satisfactory                            condition to undergo the procedure.                           After obtaining informed consent, the endoscope was                            passed under direct vision. Throughout the                            procedure, the  patient's blood pressure, pulse, and                            oxygen saturations were monitored continuously. The                            [id number]-H190 ([id number] Olympus endoscope was introduced                            through the mouth, and advanced to the second part                            of duodenum. The [id number]-[id number] ([id number]) Olympus                            duodenoscope was introduced through the mouth, and                            advanced to the area of papilla. The GF-[id number]                            ([id number]) Olympus linear ultrasound scope was                            introduced through the mouth, and advanced to the                            duodenum for ultrasound examination from the                            stomach and duodenum. The upper EUS was                            accomplished without difficulty. The patient                            tolerated the procedure. Scope In: Scope Out: Findings:      ENDOSCOPIC FINDING: :      No gross lesions were noted in the proximal esophagus and in the mid       esophagus.      LA Grade C (one or more mucosal breaks continuous between tops of 2 or  more mucosal folds, less than 75% circumference) esophagitis with no       bleeding was found in the distal esophagus.      A 3 cm hiatal hernia was present.      Diffuse severe inflammation characterized by erosions, erythema,       friability, granularity, linear erosions and shallow ulcerations was       found in the distal gastric body, at the incisura, in the gastric antrum       and in the prepyloric region of the stomach.      No other gross lesions were noted in the entire examined stomach.       Biopsies were taken with a cold forceps for histology and Helicobacter       pylori testing.      No gross lesions were noted in the duodenal bulb, in the first portion       of the duodenum and in the second portion of the duodenum.      The major papilla was  normal.      ENDOSONOGRAPHIC FINDING: :      Many enlarged lymph nodes, some that were malignant in appearance, were       visualized in the celiac region (level 20), peripancreatic region and       porta hepatis region. The largest measured 33 mm by 20 mm in maximal       cross-sectional diameter was found in the periportal region with the       next largest in the periSMA region measuring 22 mm x 13 mm. The nodes       were oval as well as some that were rounded, hypoechoic nature for the       majority of them dose and heterogeneity in the smaller lymph nodes, and       had well defined margins. Fine needle biopsy was performed of the       periportal lymph node. Color Doppler imaging was utilized prior to       needle puncture to confirm a lack of significant vascular structures       within the needle path. Six passes were made with the 22 gauge Acquire       biopsy needle using a transduodenal approach. A visible core of tissue       was obtained. Preliminary cytologic examination and touch preps were       performed. Final cytology results are pending. Using a different needle,       fine needle biopsy was performed of the periSMA lymph node. Color       Doppler imaging was utilized prior to needle puncture to confirm a lack       of significant vascular structures within the needle path. Six passes       were made with the 22 gauge Acquire biopsy needle using a transgastric       approach. A visible core of tissue was obtained. Preliminary cytologic       examination and touch preps were performed. Final cytology results are       pending.      Pancreatic parenchymal abnormalities were noted in the entire pancreas.       These consisted of lobularity without honeycombing and hyperechoic       strands.      The pancreatic duct had a normal endosonographic appearance in the       pancreatic head, genu of the pancreas, body  of the pancreas and tail of       the pancreas.       Endosonographic imaging in the common bile duct and in the common       hepatic duct showed no dilation.      A small amount of hyperechoic material consistent with sludge was       visualized endosonographically in the gallbladder.      Endosonographic imaging in the visualized portion of the liver showed no       mass.      The celiac region was visualized. Impression:               EGD impression:                           - No gross lesions in the proximal esophagus and in                            the mid esophagus.                           - LA Grade C erosive esophagitis with no bleeding                            found distally.                           - 3 cm hiatal hernia.                           - Erosive gastritis found in the distal gastric                            body/antrum/incisura. No other gross lesions in the                            entire stomach. Biopsied.                           - No gross lesions in the duodenal bulb, in the                            first portion of the duodenum and in the second                            portion of the duodenum.                           - Normal major papilla.                           EUS impression:                           - Many enlarged as well as some malignant-appearing                            lymph  nodes were visualized in the celiac region                            (level 20), peripancreatic region and porta hepatis                            region. Cytology results are pending. However, the                            endosonographic appearance is highly suspicious for                            metastatic undifferentiated carcinoma. Fine needle                            biopsy performed using 2 separate needles of the 2                            largest lymph nodes in the periSMA and periportal                            regions.                           - Pancreatic parenchymal abnormalities  consisting                            of lobularity and hyperechoic strands were noted in                            the entire pancreas.                           - The pancreatic duct had a normal endosonographic                            appearance in the pancreatic head, genu of the                            pancreas, body of the pancreas and tail of the                            pancreas.                           - Hyperechoic material consistent with sludge was                            visualized endosonographically in the gallbladder. Moderate Sedation:      Not Applicable - Patient had care per Anesthesia. Recommendation:           - The patient will be observed post-procedure,                            until all discharge criteria are met.                           -  Return patient to hospital ward for ongoing care.                           - Observe patient's clinical course.                           - Initiate PPI 40 mg twice daily.                           - Carafate before every meal plus at bedtime for 4                            weeks then twice daily thereafter.                           - Await cytology results and await path results.                           - Consideration of repeat endoscopy to follow-up                            severe esophagitis and severe gastritis/ulceration                            can be considered, though patient is dealing with a                            potential malignancy diagnosis so this can be                            considered by patient's primary gastroenterologist                            moving forward in the coming months and her overall                            health.                           - The findings and recommendations were discussed                            with the patient.                           - The findings and recommendations were discussed                            with the referring  physician. Procedure Code(s):        --- Professional ---                           [phone], Esophagogastroduodenoscopy, flexible,  transoral; with transendoscopic ultrasound-guided                            intramural or transmural fine needle                            aspiration/biopsy(s), (includes endoscopic                            ultrasound examination limited to the esophagus,                            stomach or duodenum, and adjacent structures)                           43239, 59, Esophagogastroduodenoscopy, flexible,                            transoral; with biopsy, single or multiple Diagnosis Code(s):        --- Professional ---                           K20.80, Other esophagitis without bleeding                           K44.9, Diaphragmatic hernia without obstruction or                            gangrene                           K29.60, Other gastritis without bleeding                           I89.9, Noninfective disorder of lymphatic vessels                            and lymph nodes, unspecified                           K86.9, Disease of pancreas, unspecified                           R59.1, Generalized enlarged lymph nodes                           K83.8, Other specified diseases of biliary tract CPT copyright 2022 [employer]. All rights reserved. The codes documented in this report are preliminary and upon coder review may  be revised to meet current compliance requirements. Corliss Parish, MD 01/01/2023 5:24:15 PM Number of Addenda: 0

## 2023-01-02 DIAGNOSIS — R7989 Other specified abnormal findings of blood chemistry: Secondary | ICD-10-CM | POA: Diagnosis not present

## 2023-01-02 DIAGNOSIS — R110 Nausea: Secondary | ICD-10-CM

## 2023-01-02 DIAGNOSIS — E113211 Type 2 diabetes mellitus with mild nonproliferative diabetic retinopathy with macular edema, right eye: Secondary | ICD-10-CM | POA: Diagnosis not present

## 2023-01-02 DIAGNOSIS — R7881 Bacteremia: Secondary | ICD-10-CM | POA: Diagnosis not present

## 2023-01-02 LAB — CBC WITH DIFFERENTIAL/PLATELET
Abs Immature Granulocytes: 0.04 10*3/uL (ref 0.00–0.07)
Basophils Absolute: 0 10*3/uL (ref 0.0–0.1)
Basophils Relative: 0 %
Eosinophils Absolute: 0.1 10*3/uL (ref 0.0–0.5)
Eosinophils Relative: 1 %
HCT: 39.5 % (ref 36.0–46.0)
Hemoglobin: 13.3 g/dL (ref 12.0–15.0)
Immature Granulocytes: 0 %
Lymphocytes Relative: 7 %
Lymphs Abs: 0.9 10*3/uL (ref 0.7–4.0)
MCH: 29.7 pg (ref 26.0–34.0)
MCHC: 33.7 g/dL (ref 30.0–36.0)
MCV: 88.2 fL (ref 80.0–100.0)
Monocytes Absolute: 0.9 10*3/uL (ref 0.1–1.0)
Monocytes Relative: 7 %
Neutro Abs: 10.6 10*3/uL — ABNORMAL HIGH (ref 1.7–7.7)
Neutrophils Relative %: 85 %
Platelets: 255 10*3/uL (ref 150–400)
RBC: 4.48 MIL/uL (ref 3.87–5.11)
RDW: 15 % (ref 11.5–15.5)
WBC: 12.6 10*3/uL — ABNORMAL HIGH (ref 4.0–10.5)
nRBC: 0 % (ref 0.0–0.2)

## 2023-01-02 LAB — COMPREHENSIVE METABOLIC PANEL
ALT: 45 U/L — ABNORMAL HIGH (ref 0–44)
AST: 58 U/L — ABNORMAL HIGH (ref 15–41)
Albumin: 2.4 g/dL — ABNORMAL LOW (ref 3.5–5.0)
Alkaline Phosphatase: 122 U/L (ref 38–126)
Anion gap: 10 (ref 5–15)
BUN: 10 mg/dL (ref 8–23)
CO2: 25 mmol/L (ref 22–32)
Calcium: 8.2 mg/dL — ABNORMAL LOW (ref 8.9–10.3)
Chloride: 102 mmol/L (ref 98–111)
Creatinine, Ser: 0.7 mg/dL (ref 0.44–1.00)
GFR, Estimated: >60 mL/min (ref >60)
Glucose, Bld: 109 mg/dL — ABNORMAL HIGH (ref 70–99)
Potassium: 3.4 mmol/L — ABNORMAL LOW (ref 3.5–5.1)
Sodium: 137 mmol/L (ref 135–145)
Total Bilirubin: 8.7 mg/dL — ABNORMAL HIGH (ref 0.3–1.2)
Total Protein: 6.4 g/dL — ABNORMAL LOW (ref 6.5–8.1)

## 2023-01-02 LAB — TSH: TSH: 1.148 u[IU]/mL (ref 0.350–4.500)

## 2023-01-02 LAB — MAGNESIUM: Magnesium: 1.7 mg/dL (ref 1.7–2.4)

## 2023-01-02 LAB — GLUCOSE, CAPILLARY
Glucose-Capillary: 136 mg/dL — ABNORMAL HIGH (ref 70–99)
Glucose-Capillary: 157 mg/dL — ABNORMAL HIGH (ref 70–99)

## 2023-01-02 MED ORDER — AMOXICILLIN-POT CLAVULANATE 875-125 MG PO TABS: 1.0000 | ORAL_TABLET | Freq: Two times a day (BID) | ORAL | 0 refills | Status: DC

## 2023-01-02 MED ORDER — SUCRALFATE 1 G PO TABS: 1.0000 g | ORAL_TABLET | Freq: Three times a day (TID) | ORAL | 1 refills | Status: DC

## 2023-01-02 MED ORDER — PANTOPRAZOLE SODIUM 40 MG PO TBEC: 40.0000 mg | DELAYED_RELEASE_TABLET | Freq: Two times a day (BID) | ORAL | 3 refills | Status: DC

## 2023-01-02 MED ORDER — ONDANSETRON HCL 4 MG PO TABS
4.0000 mg | ORAL_TABLET | Freq: Three times a day (TID) | ORAL | 0 refills | Status: DC | PRN
Start: 2023-01-02 — End: 2023-01-17

## 2023-01-02 NOTE — Discharge Summary (Signed)
Physician Discharge Summary   Patient: Regina Mckay MRN: [id number] DOB: April 04, 1951  Admit date:     12/30/2022  Discharge date: 01/02/23  Discharge Physician: Thad Ranger, MD    PCP: [name], NP   Recommendations at discharge:   Follow-up with Dr. Mosetta Putt next week to discuss the biopsy results. 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  During that hospital stay, she had a Klebsiella bacteremia, seen by GI due to elevated bilirubin.  She was discharged with 14-day course of Augmentin. She had imaging concerning for multiple lesions throughout the liver, abdomen, and lungs concerning for possible metastatic malignancy.  She had followup with oncology on 5/28 and was found to have further markedly elevated TBi 9, and was referred to the hospital per oncology.    Assessment and Plan:  Elevated LFTs with hyperbilirubinemia -Recent workup during previous hospitalization, surgery recommended against HIDA scan due to low suspicion for acute cholecystitis.  Previous workup showed cholelithiasis with mild gallbladder wall thickening but no biliary dilatation or choledocholithiasis except metastasis disease. -LFTs improving -GI was consulted.  MRCP showed multiple liver lesions, throughout the liver and bulky abdominal lymphadenopathy.  No biliary duct dilatation.  Cholelithiasis without evidence of acute cholecystitis. -Underwent upper EUS by Dr.  Meridee Score, showed erosive gastritis otherwise no gross lesions in the entire stomach, many enlarged as well as some malignant appearing lymph nodes in the celiac region, peripancreatic region and porta hepatis, cytology results pending -No acute issues overnight, will DC home today and outpatient follow-up for biopsy results with Dr. 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 Mosetta Putt, MRCP consistent with metastasis, bulky abdominal lymphadenopathy with nodal metastasis.  She had previously undergone liver biopsy however was negative for malignant cells. -Patient was followed by Dr. Mosetta Putt -Patient underwent EUS by Dr. Meridee Score on 01/01/2023.  EGD showed erosive gastritis in the distal gastric body/antrum/incisura biopsied.  EUS showed many enlarged as well as some malignant appearing lymph nodes in the celiac region, peripancreatic region and porta hepatis region, cytology pending.  Patient will follow-up with Dr. Mosetta Putt outpatient in 1 week to follow-up the biopsy results.   Klebsiella pneumoniae bacteremia - suspected GI source; seen by ID last hospitalization as well. 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   Weight as of this encounter: 100.7 kg.     Pain control - [employer] Controlled Substance Reporting System database was reviewed. and patient was instructed, not to drive, operate heavy machinery, perform activities at heights, swimming or participation in water activities or provide baby-sitting services while on Pain, Sleep and Anxiety Medications; until their outpatient Physician has advised to do so again. Also recommended to not to take more than prescribed Pain, Sleep and Anxiety Medications.  Consultants: Oncology, GI Procedures performed: MRCP, EUS Disposition: Home Diet recommendation:  Discharge Diet Orders (From admission, onward)     Start     Ordered   01/02/23 0000  Diet Carb Modified        01/02/23 1017            DISCHARGE MEDICATION: Allergies as of 01/02/2023       Reactions   Augmentin [amoxicillin-pot Clavulanate] Nausea Only, Other (See Comments)   Severe nausea that requires Zofran to tolerate   Lidoderm [lidocaine] Other (See Comments)   The 5% patch increased the BGL        Medication List     STOP taking these medications    acetaminophen 500 MG tablet Commonly known as: TYLENOL   lidocaine 5 % Commonly known as: LIDODERM   pravastatin 40 MG tablet Commonly known as: PRAVACHOL       TAKE these medications    amLODipine 10 MG tablet Commonly known as: NORVASC Take 1 tablet (10 mg total) by mouth daily. Follow-up appt due in July must see provider for future refills What changed: additional instructions   amoxicillin-clavulanate 875-125 MG tablet Commonly known as: AUGMENTIN Take 1 tablet by mouth 2 (two) times daily for 14 days. Continue for 4 more days, then stop What changed: additional instructions   Basaglar KwikPen 100 UNIT/ML Inject 22 Units into the skin daily. What changed: when to take this   bismuth subsalicylate 262 MG chewable  tablet Commonly known as: PEPTO BISMOL Chew 524 mg by mouth as needed for indigestion or diarrhea or loose stools.   cholecalciferol 10 MCG (400 UNIT) Tabs tablet Commonly known as: VITAMIN D3 Take 400 Units by mouth daily.   FreeStyle Libre 2 Reader [place] 1 each by Does not apply route daily.   FreeStyle Libre 2 Sensor Misc USE 1 SENSOR EVERY 14 DAYS AS DIRECTED What changed: See the new instructions.   hydrochlorothiazide 12.5 MG capsule Commonly known as: MICROZIDE Take 1 capsule (12.5 mg total) by mouth daily.   Insulin Pen Needle 32G X 4 MM Misc Use 4x a day What changed:  how much to take how to take this when to take this   lisinopril 20 MG tablet Commonly known as: ZESTRIL Take 1 tablet (20 mg total) by mouth daily. Follow-up appt due in July What changed: additional instructions   NovoLOG FlexPen 100 UNIT/ML FlexPen Generic drug: insulin aspart Inject 5-7 units under skin up to 2x a day What changed:  how much to take how to take this when to take this additional instructions   ondansetron 4 MG tablet Commonly known as: ZOFRAN Take 1 tablet (4 mg total) by mouth every 8 (eight) hours as needed for nausea or vomiting. What changed:  when to take this reasons to take this   oxyCODONE 5 MG immediate release tablet Commonly known as: Oxy IR/ROXICODONE Take 0.5-1 tablets (2.5-5 mg total) by mouth  every 4 (four) hours as needed for moderate pain.   pantoprazole 40 MG tablet Commonly known as: PROTONIX Take 1 tablet (40 mg total) by mouth 2 (two) times daily before a meal.   Semaglutide(0.25 or 0.5MG /DOS) 2 MG/3ML Sopn Inject 0.25-0.5 mg into the skin once a week. What changed:  how much to take when to take this   sucralfate 1 g tablet Commonly known as: CARAFATE Take 1 tablet (1 g total) by mouth 4 (four) times daily -  with meals and at bedtime.   Systane Hydration PF 0.4-0.3 % Soln Generic drug: Polyethyl Glyc-Propyl Glyc PF Place 1 drop into  both eyes 3 (three) times daily as needed (for dryness or irritation).        Follow-up Information     [name], NP. Schedule an appointment as soon as possible for a visit in 2 week(s).   Specialty: Nurse Practitioner Why: for hospital follow-up Contact information: [address] [place] [address] [phone]         Malachy Mood, MD. Schedule an appointment as soon as possible for a visit in 1 week(s).   Specialties: Hematology, Oncology Why: for hospital follow-up Contact information: [address] [place] [address] [phone]                Discharge Exam: Ceasar Mons Weights   12/30/22 1550 01/01/23 1400  Weight: 100.7 kg 100.7 kg   S: No acute complaints, overnight no acute issues, tolerating diet.  BP (!) 153/71 (BP Location: Left Arm)   Pulse 87   Temp 98.7 F (37.1 C) (Oral)   Resp 18   Ht 5\' 3"  (1.6 m)   Wt 100.7 kg   SpO2 97%   BMI 39.33 kg/m   Physical Exam General: Alert and oriented x 3, NAD Cardiovascular: S1 S2 clear, RRR.  Respiratory: CTAB Gastrointestinal: Soft, nontender, nondistended, NBS Ext: no pedal edema bilaterally Neuro: no new deficits Psych: Normal affect   Condition at discharge: fair  The results of significant diagnostics from this hospitalization (including imaging, microbiology, ancillary and laboratory) are listed below for reference.   Imaging Studies: MR 3D Recon At Scanner  Result Date: 12/31/2022 : CLINICAL DATA: Elevated liver enzymes, multiple liver lesions recently biopsied EXAM: MRI ABDOMEN WITHOUT AND WITH CONTRAST (INCLUDING MRCP) TECHNIQUE: Multiplanar multisequence MR imaging of the abdomen was performed both before and after the administration of intravenous contrast. Heavily T2-weighted images of the biliary and pancreatic ducts were obtained, and three-dimensional MRCP images were rendered by post processing. CONTRAST: 10mL GADAVIST GADOBUTROL 1 MMOL/ML IV SOLN COMPARISON:  12/22/2022 FINDINGS: Lower chest: No acute abnormality. Hepatobiliary: Multiple hypoenhancing liver lesions are again seen, the largest within the peripheral liver dome, hepatic segments VII and VIII (series 18, image 11). There are multiple additional smaller hypoenhancing lesions scattered throughout the liver, predominantly in the inferior right lobe, not described by prior examination but unchanged (series 18, image 38, 25). Small gallstones (series 8, image 20). No gallbladder wall thickening, or biliary dilatation. Pancreas: Unremarkable. No pancreatic ductal dilatation or surrounding inflammatory changes. Spleen: Normal in size without significant abnormality. Adrenals/Urinary Tract: Adrenal glands are unremarkable. Kidneys are normal, without renal calculi, solid lesion, or hydronephrosis. Stomach/Bowel: Stomach is within normal limits. No evidence of bowel wall thickening, distention, or inflammatory changes. Vascular/Lymphatic: No significant vascular findings are present. Unchanged bulky, matted gastrohepatic ligament, celiac axis, portacaval, retroperitoneal, and small bowel mesenteric lymph nodes. Other: No abdominal wall hernia or abnormality. Trace perihepatic ascites.  Musculoskeletal: No acute or significant osseous findings. IMPRESSION: 1. Multiple hypoenhancing liver lesions are again seen, the largest within the peripheral liver dome, hepatic segments VII and VIII. There are multiple additional smaller hypoenhancing lesions scattered throughout the liver, predominantly in the inferior right lobe, not described by prior examination but unchanged. Findings remain most consistent with metastases. Correlate with biopsy findings. 2. Unchanged bulky, matted abdominal lymphadenopathy, consistent with nodal metastatic disease. 3. Cholelithiasis without evidence of acute cholecystitis. 4. No biliary ductal dilatation. 5. Trace perihepatic ascites. Electronically Signed   By: Jearld Lesch M.D.   On:  12/31/2022 12:40   MR ABDOMEN MRCP W WO CONTAST  Result Date: 12/31/2022 CLINICAL DATA:  Elevated liver enzymes, multiple liver lesions recently biopsied EXAM: MRI ABDOMEN WITHOUT AND WITH CONTRAST (INCLUDING MRCP) TECHNIQUE: Multiplanar multisequence MR imaging of the abdomen was performed both before and after the administration of intravenous contrast. Heavily T2-weighted images of the biliary and pancreatic ducts were obtained, and three-dimensional MRCP images were rendered by post processing. CONTRAST:  10mL GADAVIST GADOBUTROL 1 MMOL/ML IV SOLN COMPARISON:  12/22/2022 FINDINGS: Lower chest: No acute abnormality. Hepatobiliary: Multiple hypoenhancing liver lesions are again seen, the largest within the peripheral liver dome, hepatic segments VII and VIII (series 18, image 11). There are multiple additional smaller hypoenhancing lesions scattered throughout the liver, predominantly in the inferior right lobe, not described by prior examination but unchanged (series 18, image 38, 25). Small gallstones (series 8, image 20). No gallbladder wall thickening, or biliary dilatation. Pancreas: Unremarkable. No pancreatic ductal dilatation or surrounding inflammatory changes. Spleen: Normal in size without significant abnormality. Adrenals/Urinary Tract: Adrenal glands are unremarkable. Kidneys are normal, without renal calculi, solid lesion, or hydronephrosis. Stomach/Bowel: Stomach is within normal limits. No evidence of bowel wall thickening, distention, or inflammatory changes. Vascular/Lymphatic: No significant vascular findings are present. Unchanged bulky, matted gastrohepatic ligament, celiac axis, portacaval, retroperitoneal, and small bowel mesenteric lymph nodes. Other: No abdominal wall hernia or abnormality. Trace perihepatic ascites. Musculoskeletal: No acute or significant osseous findings. IMPRESSION: 1. Multiple hypoenhancing liver lesions are again seen, the largest within the peripheral liver  dome, hepatic segments VII and VIII. There are multiple additional smaller hypoenhancing lesions scattered throughout the liver, predominantly in the inferior right lobe, not described by prior examination but unchanged. Findings remain most consistent with metastases. Correlate with biopsy findings. 2. Unchanged bulky, matted abdominal lymphadenopathy, consistent with nodal metastatic disease. 3. Cholelithiasis without evidence of acute cholecystitis. 4. No biliary ductal dilatation. 5. Trace perihepatic ascites. Electronically Signed   By: Jearld Lesch M.D.   On: 12/31/2022 12:18   US THYROID  Result Date: 12/31/2022 CLINICAL DATA:  Incidental on CT. EXAM: THYROID ULTRASOUND TECHNIQUE: Ultrasound examination of the thyroid gland and adjacent soft tissues was performed. COMPARISON:  None Available. FINDINGS: Parenchymal Echotexture: Moderately heterogenous Isthmus: 1.0 cm Right lobe: 5.7 x 2.7 x 3.1 cm Left lobe: 4.3 x 1.7 x 2.3 cm _________________________________________________________ Estimated total number of nodules >/= 1 cm: 3 Number of spongiform nodules >/=  2 cm not described below (TR1): 0 Number of mixed cystic and solid nodules >/= 1.5 cm not described below (TR2): 0 _________________________________________________________ Nodule # 1: Isoechoic ill-defined solid nodule in the right mid gland measures 2.1 x 1.9 x 1.6 cm. No evidence of calcifications. Findings are consistent with TI-RADS category 3. *Given size (>/= 1.5 - 2.4 cm) and appearance, a follow-up ultrasound in 1 year should be considered based on TI-RADS criteria. Nodule # 2: Smaller 1.4 cm isoechoic  solid nodule versus pseudo nodule in the right inferior gland. Given size (<1.4 cm) and appearance, this nodule does NOT meet TI-RADS criteria for biopsy or dedicated follow-up. Nodule # 3: Ill-defined 1.3 cm hypoechoic solid nodule in the left inferior gland. TI-RADS category 4. *Given size (>/= 1 - 1.4 cm) and appearance, a follow-up  ultrasound in 1 year should be considered based on TI-RADS criteria. IMPRESSION: 1. Enlarged, heterogeneous and multinodular thyroid gland. 2. Nodule # 1 is a 2.1 cm TI-RADS category 3 nodule in the right mid gland meets criteria for imaging surveillance. Recommend follow-up ultrasound in 1 year. 3. Nodule # 3 is a 1.3 cm TI-RADS category 4 nodule in the left inferior gland and also meets criteria for imaging surveillance. The above is in keeping with the ACR TI-RADS recommendations - J Am Coll Radiol 2017;14:587-595. Electronically Signed   By: Malachy Moan M.D.   On: 12/31/2022 06:55   US BIOPSY (LIVER)  Result Date: 12/23/2022 INDICATION: [id number] Liver lesion [id number] EXAM: ULTRASOUND GUIDED LIVER MASS BIOPSY COMPARISON:  CT AP, 12/20/2022.  US Abdomen, 12/21/2022 MEDICATIONS: None ANESTHESIA/SEDATION: Moderate (conscious) sedation was employed during this procedure. A total of Versed 1.5 mg and Fentanyl 100 mcg was administered intravenously. Moderate Sedation Time: 23 minutes. The patient's level of consciousness and vital signs were monitored continuously by radiology nursing throughout the procedure under my direct supervision. COMPLICATIONS: None immediate. PROCEDURE: Informed written consent was obtained from the patient and/or patient's representative after a discussion of the risks, benefits and alternatives to treatment. The patient understands and consents the procedure. A timeout was performed prior to the initiation of the procedure. Ultrasound scanning was performed of the right upper abdominal quadrant demonstrates ill-defined RIGHT hepatic lobe liver mass The RIGHT hepatic lobe mass was selected for biopsy and the procedure was planned. The right upper abdominal quadrant was prepped and draped in the usual sterile fashion. The overlying soft tissues were anesthetized with 1% lidocaine with epinephrine. A 17 gauge, 6.8 cm co-axial needle was advanced into a peripheral aspect of the lesion.  This was followed by 4 core biopsies with an 18 gauge core device under direct ultrasound guidance. The coaxial needle tract was embolized with a small amount of Gel-Foam slurry and superficial hemostasis was obtained with manual compression. Post procedural scanning was negative for definitive area of hemorrhage or additional complication. A dressing was placed. The patient tolerated the procedure well without immediate post procedural complication. IMPRESSION: Successful ultrasound guided core needle biopsy of liver mass. Roanna Banning, MD [facility] Radiology Electronically Signed   By: Roanna Banning M.D.   On: 12/23/2022 17:32   MR ABDOMEN MRCP W WO CONTAST  Result Date: 12/23/2022 CLINICAL DATA:  Right upper quadrant abdominal pain. Liver lesions on previous imaging. EXAM: MRI ABDOMEN WITHOUT AND WITH CONTRAST (INCLUDING MRCP) TECHNIQUE: Multiplanar multisequence MR imaging of the abdomen was performed both before and after the administration of intravenous contrast. Heavily T2-weighted images of the biliary and pancreatic ducts were obtained, and three-dimensional MRCP images were rendered by post processing. CONTRAST:  10mL GADAVIST GADOBUTROL 1 MMOL/ML IV SOLN COMPARISON:  Chest CT 12/21/2022.  Abdomen and pelvis CT 12/20/2022 FINDINGS: Lower chest: The pulmonary nodule seen on recent chest CT are not well demonstrated by MRI. Hepatobiliary: As noted on recent CT scan, 3 distinct lesions are noted in the liver. These are relatively subtle but do show some peripheral enhancement and evidence of restricted diffusion. Index lesion anterior hepatic dome measures  2.3 x 2.2 cm on T2 image 10 of series 7. Lesion more posterior in the hepatic dome measures 1.9 cm on the same image. Posterior right hepatic lobe lesion measures 14 mm on image 11 of series 7. Additional tiny hypoenhancing lesions seen in the liver parenchyma on previous CT are less well demonstrated by  MRI. Intrahepatic bile ducts are somewhat prominent but there is no extrahepatic biliary duct dilatation. No evidence for choledocholithiasis. Gallbladder wall is diffusely thickened and edematous. Tiny dependent gallstones (2-3 mm) are seen in the dependent gallbladder lumen (coronal T2 image 12 of series 6 and axial T2 image 23 of series 7) MRCP images are motion degraded but some element of mass-effect on the confluence of the left and right hepatic ducts cannot be excluded.). Pancreas: No focal mass lesion. No dilatation of the main duct. No intraparenchymal cyst. No peripancreatic edema. Spleen:  No splenomegaly. No focal mass lesion. Adrenals/Urinary Tract: No adrenal nodule or mass. Kidneys unremarkable. Stomach/Bowel: Stomach is unremarkable. No gastric wall thickening. No evidence of outlet obstruction. Duodenum is normally positioned as is the ligament of Treitz. No small bowel or colonic dilatation within the visualized abdomen. Vascular/Lymphatic: No abdominal aortic aneurysm. No abdominal aortic atherosclerotic calcification. Main portal vein is patent. Right portal vein appears markedly attenuated and patency cannot be confirmed. This is similar to recent CT scan. SMV and splenic vein are patent. Bulky lymphadenopathy in the hepatoduodenal ligament again noted. There is associated retroperitoneal lymphadenopathy. Other:  No substantial intraperitoneal free fluid. Musculoskeletal: No focal suspicious marrow enhancement within the visualized bony anatomy. IMPRESSION: 1. 3 distinct lesions are noted in the liver parenchyma. These are relatively subtle but do show some peripheral enhancement and evidence of restricted diffusion. Additional tiny hypoenhancing lesions seen in the liver parenchyma on previous CT are less well demonstrated by MRI. Imaging features raise concern for metastatic disease. 2. Bulky lymphadenopathy in the hepatoduodenal ligament again noted. There is associated retroperitoneal  lymphadenopathy. 3. Right portal vein appears markedly attenuated and patency cannot be confirmed. This is similar to recent CT scan. Main portal vein is patent. SMV and splenic vein are patent. 4. Cholelithiasis with apparent gallbladder wall thickening and mural edema. Acute cholecystitis not excluded. 5. MRCP images are motion degraded but there is no extrahepatic biliary duct dilatation. No evidence for choledocholithiasis. Some element of mass-effect on the confluence of the left and right hepatic ducts cannot be excluded. 6. The pulmonary nodule seen on recent chest CT are not well demonstrated by MRI. Electronically Signed   By: Kennith Center M.D.   On: 12/23/2022 06:46   MR 3D Recon At Scanner  Result Date: 12/23/2022 CLINICAL DATA:  Right upper quadrant abdominal pain. Liver lesions on previous imaging. EXAM: MRI ABDOMEN WITHOUT AND WITH CONTRAST (INCLUDING MRCP) TECHNIQUE: Multiplanar multisequence MR imaging of the abdomen was performed both before and after the administration of intravenous contrast. Heavily T2-weighted images of the biliary and pancreatic ducts were obtained, and three-dimensional MRCP images were rendered by post processing. CONTRAST:  10mL GADAVIST GADOBUTROL 1 MMOL/ML IV SOLN COMPARISON:  Chest CT 12/21/2022.  Abdomen and pelvis CT 12/20/2022 FINDINGS: Lower chest: The pulmonary nodule seen on recent chest CT are not well demonstrated by MRI. Hepatobiliary: As noted on recent CT scan, 3 distinct lesions are noted in the liver. These are relatively subtle but do show some peripheral enhancement and evidence of restricted diffusion. Index lesion anterior hepatic dome measures 2.3 x 2.2 cm on T2 image 10 of series 7.  Lesion more posterior in the hepatic dome measures 1.9 cm on the same image. Posterior right hepatic lobe lesion measures 14 mm on image 11 of series 7. Additional tiny hypoenhancing lesions seen in the liver parenchyma on previous CT are less well demonstrated by MRI.  Intrahepatic bile ducts are somewhat prominent but there is no extrahepatic biliary duct dilatation. No evidence for choledocholithiasis. Gallbladder wall is diffusely thickened and edematous. Tiny dependent gallstones (2-3 mm) are seen in the dependent gallbladder lumen (coronal T2 image 12 of series 6 and axial T2 image 23 of series 7) MRCP images are motion degraded but some element of mass-effect on the confluence of the left and right hepatic ducts cannot be excluded.). Pancreas: No focal mass lesion. No dilatation of the main duct. No intraparenchymal cyst. No peripancreatic edema. Spleen:  No splenomegaly. No focal mass lesion. Adrenals/Urinary Tract: No adrenal nodule or mass. Kidneys unremarkable. Stomach/Bowel: Stomach is unremarkable. No gastric wall thickening. No evidence of outlet obstruction. Duodenum is normally positioned as is the ligament of Treitz. No small bowel or colonic dilatation within the visualized abdomen. Vascular/Lymphatic: No abdominal aortic aneurysm. No abdominal aortic atherosclerotic calcification. Main portal vein is patent. Right portal vein appears markedly attenuated and patency cannot be confirmed. This is similar to recent CT scan. SMV and splenic vein are patent. Bulky lymphadenopathy in the hepatoduodenal ligament again noted. There is associated retroperitoneal lymphadenopathy. Other:  No substantial intraperitoneal free fluid. Musculoskeletal: No focal suspicious marrow enhancement within the visualized bony anatomy. IMPRESSION: 1. 3 distinct lesions are noted in the liver parenchyma. These are relatively subtle but do show some peripheral enhancement and evidence of restricted diffusion. Additional tiny hypoenhancing lesions seen in the liver parenchyma on previous CT are less well demonstrated by MRI. Imaging features raise concern for metastatic disease. 2. Bulky lymphadenopathy in the hepatoduodenal ligament again noted. There is associated retroperitoneal  lymphadenopathy. 3. Right portal vein appears markedly attenuated and patency cannot be confirmed. This is similar to recent CT scan. Main portal vein is patent. SMV and splenic vein are patent. 4. Cholelithiasis with apparent gallbladder wall thickening and mural edema. Acute cholecystitis not excluded. 5. MRCP images are motion degraded but there is no extrahepatic biliary duct dilatation. No evidence for choledocholithiasis. Some element of mass-effect on the confluence of the left and right hepatic ducts cannot be excluded. 6. The pulmonary nodule seen on recent chest CT are not well demonstrated by MRI. Electronically Signed   By: Kennith Center M.D.   On: 12/23/2022 06:46   CT CHEST W CONTRAST  Result Date: 12/22/2022 CLINICAL DATA:  Inpatient. Abnormal CT abdomen/pelvis study with liver lesions and retroperitoneal adenopathy suspicious for metastatic disease of uncertain primary. * Tracking Code: BO * EXAM: CT CHEST WITH CONTRAST TECHNIQUE: Multidetector CT imaging of the chest was performed during intravenous contrast administration. RADIATION DOSE REDUCTION: This exam was performed according to the departmental dose-optimization program which includes automated exposure control, adjustment of the mA and/or kV according to patient size and/or use of iterative reconstruction technique. CONTRAST:  75mL OMNIPAQUE IOHEXOL 300 MG/ML  SOLN COMPARISON:  12/20/2022 CT abdomen/pelvis and chest radiograph. FINDINGS: Cardiovascular: Normal heart size. No significant pericardial effusion/thickening. Three-vessel coronary atherosclerosis. Atherosclerotic nonaneurysmal thoracic aorta. Normal caliber pulmonary arteries. No central pulmonary emboli. Mediastinum/Nodes: Hypodense posterior right thyroid 1.5 cm nodule. Unremarkable esophagus. Mild left supraclavicular adenopathy up to 1.0 cm (series 2/image 15). No axillary adenopathy. Mildly enlarged low posterior mediastinal para-lymph nodes up to 1.2 cm on the right  (  series 2/image 104). No hilar adenopathy. Lungs/Pleura: No pneumothorax. No pleural effusion. No acute consolidative airspace disease or lung masses. At least five solid pulmonary nodules scattered in the lungs bilaterally, largest 0.5 cm in the posteromedial basilar subpleural right lower lobe (series 6/image 107), 0.4 cm in the medial left lower lobe (series 6/image 100) and 0.4 cm in the anterior right middle lobe (series 6/image 70). Upper abdomen: Multiple persistent hypodense superior right liver lesions, poorly defined on this chest CT study. Subcentimeter hypodense posterior upper left renal cortical lesion, too small to characterize, for which no follow-up imaging is recommended. Partially visualized porta hepatis, portacaval and aortocaval adenopathy in the upper abdomen, better seen on CT abdomen study from 1 day prior. Musculoskeletal: No aggressive appearing focal osseous lesions. Marked thoracic degenerative disc disease. IMPRESSION: 1. At least five solid pulmonary nodules scattered in the lungs bilaterally, largest 0.5 cm, equivocal for pulmonary metastases. Recommend attention on follow-up chest CT in 3 months. 2. Mild left supraclavicular and low posterior mediastinal lymphadenopathy, suspicious for nodal metastases. 3. Multiple persistent hypodense superior right liver lesions and partially visualized porta hepatis, portacaval and aortocaval adenopathy in the upper abdomen, all better seen on CT abdomen study from 1 day prior. 4. Hypodense posterior right thyroid 1.5 cm nodule. Recommend non-emergent thyroid ultrasound. Reference: J Am Coll Radiol. 2015 Feb;12(2): 143-50 5. Three-vessel coronary atherosclerosis. 6.  Aortic Atherosclerosis (ICD10-I70.0). Electronically Signed   By: Delbert Phenix M.D.   On: 12/22/2022 08:00   US Abdomen Limited RUQ (LIVER/GB)  Result Date: 12/21/2022 CLINICAL DATA:  Abdominal pain EXAM: ULTRASOUND ABDOMEN LIMITED RIGHT UPPER QUADRANT COMPARISON:  CT from the  previous day FINDINGS: Gallbladder: Gallbladder incompletely distended. Mild gallbladder wall thickening up to 6 mm. To nerve root reports no sonographic Murphy sign however. Several gallstones measuring up to 8 mm in the lumen. Common bile duct: Diameter: 4 mm.  No intrahepatic biliary ductal dilatation. Liver: Scattered echogenic lesions corresponding to CT findings. Portal vein is patent on color Doppler imaging with normal direction of blood flow towards the liver. Other: Technologist describes technically difficult study secondary to patient breathing, overlying bowel, body habitus. IMPRESSION: 1. Cholelithiasis with mild gallbladder wall thickening. No sonographic Murphy sign. 2. No biliary dilatation. 3. Scattered echogenic liver lesions corresponding to CT findings, presumed metastatic disease. Electronically Signed   By: Corlis Leak M.D.   On: 12/21/2022 11:16   CT ABDOMEN PELVIS W CONTRAST  Result Date: 12/20/2022 CLINICAL DATA:  72 year old female with acute abdominal and pelvic pain. EXAM: CT ABDOMEN AND PELVIS WITH CONTRAST TECHNIQUE: Multidetector CT imaging of the abdomen and pelvis was performed using the standard protocol following bolus administration of intravenous contrast. RADIATION DOSE REDUCTION: This exam was performed according to the departmental dose-optimization program which includes automated exposure control, adjustment of the mA and/or kV according to patient size and/or use of iterative reconstruction technique. CONTRAST:  OMNIPAQUE IOHEXOL 300 MG/ML  SOLN COMPARISON:  05/17/2019 CT and prior studies FINDINGS: Lower chest: No acute abnormality. Hepatobiliary: There are multiple low-density lesions within both the RIGHT and LEFT liver, with index 1.5 x 2 cm lesion and 1.5 x 2.5 cm lesion at the dome (image 10: Series 2). Gallbladder wall thickening and cholelithiasis noted. No definite biliary dilatation identified. Pancreas: Unremarkable Spleen: Unremarkable Adrenals/Urinary  Tract: Punctate nonobstructing bilateral renal calculi are again identified without other significant change. There is no evidence of suspicious renal mass hydronephrosis. The adrenal glands and bladder are unremarkable. Stomach/Bowel: No definite bowel wall  thickening noted. There is no evidence of bowel obstruction or inflammatory changes. Vascular/Lymphatic: Periportal, mesenteric, periaortic and retroperitoneal adenopathy identified with the index 1.6 x 3.9 cm portacaval node (22:2) identified. Aortic atherosclerotic calcifications noted without aneurysm. Reproductive: Uterus and adnexal regions are unchanged with ureteral calcifications/fibroids. Other: No ascites, focal collection or pneumoperitoneum. Musculoskeletal: No acute or suspicious bony abnormalities are noted. Moderate degenerative disc disease/spondylosis at T12-L1 and L4-5 again noted. IMPRESSION: 1. Multiple low-density lesions throughout the liver and abdominal/retroperitoneal lymphadenopathy, highly suspicious for malignancy/metastatic disease. Primary malignancy is not identified on this examination. 2. Gallbladder wall thickening and cholelithiasis without definite biliary dilatation. If there is strong clinical suspicion for acute cholecystitis, recommend ultrasound or nuclear medicine study. 3. Punctate nonobstructing bilateral renal calculi. 4.  Aortic Atherosclerosis (ICD10-I70.0). Electronically Signed   By: Harmon Pier M.D.   On: 12/20/2022 15:38   DG Chest Port 1 View  Result Date: 12/20/2022 CLINICAL DATA:  Nausea and vomiting.  Melena.  Sepsis. EXAM: PORTABLE CHEST 1 VIEW COMPARISON:  10/10/2020 FINDINGS: Patient is rotated to the left. Heart size is within normal limits. Aortic atherosclerotic calcification incidentally noted. Both lungs are clear. IMPRESSION: No active disease. Electronically Signed   By: Danae Orleans M.D.   On: 12/20/2022 13:55    Microbiology: Results for orders placed or performed during the hospital  encounter of 12/20/22  Resp panel by RT-PCR (RSV, Flu A&B, Covid) Anterior Nasal Swab     Status: None   Collection Time: 12/20/22 12:24 PM   Specimen: Anterior Nasal Swab  Result Value Ref Range Status   SARS Coronavirus 2 by RT PCR NEGATIVE NEGATIVE Final    Comment: (NOTE) SARS-CoV-2 target nucleic acids are NOT DETECTED.  The SARS-CoV-2 RNA is generally detectable in upper respiratory specimens during the acute phase of infection. The lowest concentration of SARS-CoV-2 viral copies this assay can detect is 138 copies/mL. A negative result does not preclude SARS-Cov-2 infection and should not be used as the sole basis for treatment or other patient management decisions. A negative result may occur with  improper specimen collection/handling, submission of specimen other than nasopharyngeal swab, presence of viral mutation(s) within the areas targeted by this assay, and inadequate number of viral copies(<138 copies/mL). A negative result must be combined with clinical observations, patient history, and epidemiological information. The expected result is Negative.  Fact Sheet for Patients:  BloggerCourse.com  Fact Sheet for Healthcare Providers:  SeriousBroker.it  This test is no t yet approved or cleared by the [place] FDA and  has been authorized for detection and/or diagnosis of SARS-CoV-2 by FDA under an Emergency Use Authorization (EUA). This EUA will remain  in effect (meaning this test can be used) for the duration of the COVID-19 declaration under Section 564(b)(1) of the Act, 21 U.S.C.section 360bbb-3(b)(1), unless the authorization is terminated  or revoked sooner.       Influenza A by PCR NEGATIVE NEGATIVE Final   Influenza B by PCR NEGATIVE NEGATIVE Final    Comment: (NOTE) The Xpert Xpress SARS-CoV-2/FLU/RSV plus assay is intended as an aid in the diagnosis of influenza from Nasopharyngeal swab specimens  and should not be used as a sole basis for treatment. Nasal washings and aspirates are unacceptable for Xpert Xpress SARS-CoV-2/FLU/RSV testing.  Fact Sheet for Patients: BloggerCourse.com  Fact Sheet for Healthcare Providers: SeriousBroker.it  This test is not yet approved or cleared by the [place] FDA and has been authorized for detection and/or diagnosis of SARS-CoV-2 by FDA under an  Emergency Use Authorization (EUA). This EUA will remain in effect (meaning this test can be used) for the duration of the COVID-19 declaration under Section 564(b)(1) of the Act, 21 U.S.C. section 360bbb-3(b)(1), unless the authorization is terminated or revoked.     Resp Syncytial Virus by PCR NEGATIVE NEGATIVE Final    Comment: (NOTE) Fact Sheet for Patients: BloggerCourse.com  Fact Sheet for Healthcare Providers: SeriousBroker.it  This test is not yet approved or cleared by the [place] FDA and has been authorized for detection and/or diagnosis of SARS-CoV-2 by FDA under an Emergency Use Authorization (EUA). This EUA will remain in effect (meaning this test can be used) for the duration of the COVID-19 declaration under Section 564(b)(1) of the Act, 21 U.S.C. section 360bbb-3(b)(1), unless the authorization is terminated or revoked.  Performed at [facility], [address], [place], [place] [address]   Blood Culture (routine x 2)     Status: Abnormal   Collection Time: 12/20/22  1:08 PM   Specimen: BLOOD  Result Value Ref Range Status   Specimen Description   Final    BLOOD LEFT ANTECUBITAL Performed at [facility], [address] [place], [place] [address]    Special Requests   Final    BOTTLES DRAWN AEROBIC AND ANAEROBIC Blood Culture adequate volume Performed at [facility], [address], [place], [place]  [address]    Culture  Setup Time   Final    GRAM NEGATIVE RODS IN BOTH AEROBIC AND ANAEROBIC BOTTLES CRITICAL RESULT CALLED TO, READ BACK BY AND VERIFIED WITH: PHARMD E. JACKSON 12/21/22 @ 0645 BY AB Performed at [facility], [address], [place], [place] [address]    Culture KLEBSIELLA PNEUMONIAE (A)  Final   Report Status 12/23/2022 FINAL  Final   Organism ID, Bacteria KLEBSIELLA PNEUMONIAE  Final      Susceptibility   Klebsiella pneumoniae - MIC*    AMPICILLIN >=32 RESISTANT Resistant     CEFEPIME <=0.12 SENSITIVE Sensitive     CEFTAZIDIME <=1 SENSITIVE Sensitive     CEFTRIAXONE <=0.25 SENSITIVE Sensitive     CIPROFLOXACIN <=0.25 SENSITIVE Sensitive     GENTAMICIN <=1 SENSITIVE Sensitive     IMIPENEM 0.5 SENSITIVE Sensitive     TRIMETH/SULFA <=20 SENSITIVE Sensitive     AMPICILLIN/SULBACTAM 4 SENSITIVE Sensitive     PIP/TAZO <=4 SENSITIVE Sensitive     * KLEBSIELLA PNEUMONIAE  Blood Culture ID Panel (Reflexed)     Status: Abnormal   Collection Time: 12/20/22  1:08 PM  Result Value Ref Range Status   Enterococcus faecalis NOT DETECTED NOT DETECTED Final   Enterococcus Faecium NOT DETECTED NOT DETECTED Final   Listeria monocytogenes NOT DETECTED NOT DETECTED Final   Staphylococcus species NOT DETECTED NOT DETECTED Final   Staphylococcus aureus (BCID) NOT DETECTED NOT DETECTED Final   Staphylococcus epidermidis NOT DETECTED NOT DETECTED Final   Staphylococcus lugdunensis NOT DETECTED NOT DETECTED Final   Streptococcus species NOT DETECTED NOT DETECTED Final   Streptococcus agalactiae NOT DETECTED NOT DETECTED Final   Streptococcus pneumoniae NOT DETECTED NOT DETECTED Final   Streptococcus pyogenes NOT DETECTED NOT DETECTED Final   A.calcoaceticus-baumannii NOT DETECTED NOT DETECTED Final   Bacteroides fragilis NOT DETECTED NOT DETECTED Final   Enterobacterales DETECTED (A) NOT DETECTED Final    Comment: Enterobacterales represent a large order of gram negative bacteria,  not a single organism. CRITICAL RESULT CALLED TO, READ BACK BY AND VERIFIED WITH: PHARMD E.  JACKSON 12/21/22 @ 0645 BY AB    Enterobacter cloacae complex NOT DETECTED NOT DETECTED Final   Escherichia coli NOT DETECTED NOT DETECTED Final   Klebsiella aerogenes NOT DETECTED NOT DETECTED Final   Klebsiella oxytoca NOT DETECTED NOT DETECTED Final   Klebsiella pneumoniae DETECTED (A) NOT DETECTED Final    Comment: CRITICAL RESULT CALLED TO, READ BACK BY AND VERIFIED WITH: PHARMD E. JACKSON 12/21/22 @ 0645 BY AB    Proteus species NOT DETECTED NOT DETECTED Final   Salmonella species NOT DETECTED NOT DETECTED Final   Serratia marcescens NOT DETECTED NOT DETECTED Final   Haemophilus influenzae NOT DETECTED NOT DETECTED Final   Neisseria meningitidis NOT DETECTED NOT DETECTED Final   Pseudomonas aeruginosa NOT DETECTED NOT DETECTED Final   Stenotrophomonas maltophilia NOT DETECTED NOT DETECTED Final   Candida albicans NOT DETECTED NOT DETECTED Final   Candida auris NOT DETECTED NOT DETECTED Final   Candida glabrata NOT DETECTED NOT DETECTED Final   Candida krusei NOT DETECTED NOT DETECTED Final   Candida parapsilosis NOT DETECTED NOT DETECTED Final   Candida tropicalis NOT DETECTED NOT DETECTED Final   Cryptococcus neoformans/gattii NOT DETECTED NOT DETECTED Final   CTX-M ESBL NOT DETECTED NOT DETECTED Final   Carbapenem resistance IMP NOT DETECTED NOT DETECTED Final   Carbapenem resistance KPC NOT DETECTED NOT DETECTED Final   Carbapenem resistance NDM NOT DETECTED NOT DETECTED Final   Carbapenem resist OXA 48 LIKE NOT DETECTED NOT DETECTED Final   Carbapenem resistance VIM NOT DETECTED NOT DETECTED Final    Comment: Performed at [facility], [address] [place], [place] [address]  Blood Culture (routine x 2)     Status: None   Collection Time: 12/20/22  7:33 PM   Specimen: BLOOD RIGHT ARM  Result Value Ref Range Status   Specimen Description BLOOD RIGHT ARM  Final   Special  Requests   Final    BOTTLES DRAWN AEROBIC AND ANAEROBIC Blood Culture adequate volume   Culture   Final    NO GROWTH 5 DAYS Performed at [facility], [address], [place], [place] [address]    Report Status 12/25/2022 FINAL  Final  C Difficile Quick Screen w PCR reflex     Status: None   Collection Time: 12/21/22  5:51 PM   Specimen: Stool  Result Value Ref Range Status   C Diff antigen NEGATIVE NEGATIVE Final   C Diff toxin NEGATIVE NEGATIVE Final   C Diff interpretation No C. difficile detected.  Final    Comment: Performed at [facility], [address] [place], [place] [address]  Gastrointestinal Panel by PCR , Stool     Status: None   Collection Time: 12/21/22  5:51 PM   Specimen: Stool  Result Value Ref Range Status   Campylobacter species NOT DETECTED NOT DETECTED Final   Plesimonas shigelloides NOT DETECTED NOT DETECTED Final   Salmonella species NOT DETECTED NOT DETECTED Final   Yersinia enterocolitica NOT DETECTED NOT DETECTED Final   Vibrio species NOT DETECTED NOT DETECTED Final   Vibrio cholerae NOT DETECTED NOT DETECTED Final   Enteroaggregative E coli (EAEC) NOT DETECTED NOT DETECTED Final   Enteropathogenic E coli (EPEC) NOT DETECTED NOT DETECTED Final   Enterotoxigenic E coli (ETEC) NOT DETECTED NOT DETECTED Final   Shiga like toxin producing E coli (STEC) NOT DETECTED NOT DETECTED Final   Shigella/Enteroinvasive E coli (EIEC) NOT DETECTED NOT DETECTED Final   Cryptosporidium NOT DETECTED  NOT DETECTED Final   Cyclospora cayetanensis NOT DETECTED NOT DETECTED Final   Entamoeba histolytica NOT DETECTED NOT DETECTED Final   Giardia lamblia NOT DETECTED NOT DETECTED Final   Adenovirus F40/41 NOT DETECTED NOT DETECTED Final   Astrovirus NOT DETECTED NOT DETECTED Final   Norovirus GI/GII NOT DETECTED NOT DETECTED Final   Rotavirus A NOT DETECTED NOT DETECTED Final   Sapovirus (I, II, IV, and V) NOT DETECTED NOT DETECTED Final     Comment: Performed at [facility], [address] [place], [place] [address]    Labs: CBC: Recent Labs  Lab 12/30/22 1210 12/31/22 0559 01/01/23 0614 01/02/23 0644  WBC 9.5 8.4 8.0 12.6*  NEUTROABS 7.4 6.4 6.2 10.6*  HGB 14.7 13.0 12.3 13.3  HCT 43.5 39.1 37.1 39.5  MCV 86.5 87.5 88.8 88.2  PLT 295 256 234 255   Basic Metabolic Panel: Recent Labs  Lab 12/30/22 1210 12/31/22 0559 01/01/23 0614 01/02/23 0644  NA 137 136 137 137  K 3.8 3.1* 3.2* 3.4*  CL 96* 98 102 102  CO2 33* 29 27 25   GLUCOSE 148* 104* 110* 109*  BUN 9 8 7* 10  CREATININE 1.02* 0.71 0.58 0.70  CALCIUM 8.7* 8.0* 7.9* 8.2*  MG  --  1.6* 1.8 1.7   Liver Function Tests: Recent Labs  Lab 12/30/22 1210 12/31/22 0559 01/01/23 0614 01/02/23 0644  AST 74* 69* 59* 58*  ALT 62* 54* 48* 45*  ALKPHOS 165* 122 123 122  BILITOT 9.0* 7.6* 7.5* 8.7*  PROT 7.0 6.3* 6.0* 6.4*  ALBUMIN 3.5 2.5* 2.4* 2.4*   CBG: Recent Labs  Lab 01/01/23 1435 01/01/23 1716 01/01/23 2119 01/02/23 1022 01/02/23 1153  GLUCAP 87 80 120* 136* 157*    Discharge time spent: greater than 30 minutes.  Signed: Thad Ranger, MD [facility] 01/02/2023

## 2023-01-02 NOTE — TOC Transition Note (Signed)
Transition of Care [facility]) - CM/SW Discharge Note   Patient Details  Name: Regina Mckay MRN: [id number] Date of Birth: [birth year]  [facility]) CM/SW Contact:  Beckie Busing, RN Phone Number:[phone]  01/02/2023, 12:27 PM   Clinical Narrative:    Patient with discharge orders no TOC needs noted   Final next level of care: Home/Self Care Barriers to Discharge: No Barriers Identified   Patient Goals and CMS Choice CMS Medicare.gov Compare Post Acute Care list provided to::  (n/a) Choice offered to / list presented to : NA  Discharge Placement                         Discharge Plan and Services Additional resources added to the After Visit Summary for                  DME Arranged: N/A         HH Arranged: NA          Social Determinants of Health (SDOH) Interventions SDOH Screenings   Food Insecurity: No Food Insecurity (12/30/2022)  Housing: Low Risk  (12/30/2022)  Transportation Needs: No Transportation Needs (12/30/2022)  Utilities: Not At Risk (12/30/2022)  Depression (PHQ2-9): Low Risk  (12/26/2022)  Tobacco Use: Medium Risk (01/01/2023)     Readmission Risk Interventions     No data to display

## 2023-01-04 ENCOUNTER — Encounter (HOSPITAL_COMMUNITY): Payer: Self-pay | Admitting: Gastroenterology

## 2023-01-05 ENCOUNTER — Telehealth: Payer: Self-pay

## 2023-01-05 ENCOUNTER — Other Ambulatory Visit: Payer: Self-pay | Admitting: Internal Medicine

## 2023-01-05 ENCOUNTER — Encounter: Payer: Self-pay | Admitting: Gastroenterology

## 2023-01-05 ENCOUNTER — Inpatient Hospital Stay: Payer: Federal, State, Local not specified - PPO | Admitting: Internal Medicine

## 2023-01-05 ENCOUNTER — Ambulatory Visit: Payer: Federal, State, Local not specified - PPO

## 2023-01-05 LAB — CYTOLOGY - NON PAP

## 2023-01-05 NOTE — Transitions of Care (Post Inpatient/ED Visit) (Signed)
01/05/2023  Name: Regina Mckay MRN: [id number] DOB: Aug 22, 1950  Today's TOC FU Call Status: TOC FU Call Complete Date: 01/05/23  Transition Care Management Follow-up Telephone Call Date of Discharge: 01/02/23 Discharge Facility: [facility]) Type of Discharge: Inpatient Admission Primary Inpatient Discharge Diagnosis:: Elevated LFTs with hyperbilirubinemia How have you been since you were released from the hospital?: Better Any questions or concerns?: No  Items Reviewed: Did you receive and understand the discharge instructions provided?: Yes Medications obtained,verified, and reconciled?: Yes (Medications Reviewed) Any new allergies since your discharge?: No Dietary orders reviewed?: Yes Do you have support at home?: Yes  Medications Reviewed Today: Medications Reviewed Today     Reviewed by Merleen Nicely, LPN (Licensed Practical Nurse) on 01/05/23 at 0940  Med List Status: <None>   Medication Order Taking? Sig Documenting Provider Last Dose Status Informant  amLODipine (NORVASC) 10 MG tablet [id number] Yes Take 1 tablet (10 mg total) by mouth daily. Follow-up appt due in July must see provider for future refills  Patient taking differently: Take 10 mg by mouth daily.   Regina Paddy, NP Taking Active Self  amoxicillin-clavulanate (AUGMENTIN) 875-125 MG tablet [id number] Yes Take 1 tablet by mouth 2 (two) times daily for 14 days. Continue for 4 more days, then stop Rai, Ripudeep K, MD Taking Active   bismuth subsalicylate (PEPTO BISMOL) 262 MG chewable tablet [id number] Yes Chew 524 mg by mouth as needed for indigestion or diarrhea or loose stools. [provider] Taking Active Self  cholecalciferol (VITAMIN D3) 10 MCG (400 UNIT) TABS tablet [id number] Yes Take 400 Units by mouth daily. [provider] Taking Active Self  Continuous Blood Gluc Receiver (FREESTYLE LIBRE 2 READER) DEVI [id number] Yes 1 each by Does not apply route daily. Carlus Pavlov, MD  Taking Active Self  Continuous Blood Gluc Sensor (FREESTYLE LIBRE 2 SENSOR) [place] [id number] Yes USE 1 SENSOR EVERY 14 DAYS AS DIRECTED  Patient taking differently: Inject 1 Device into the skin every 14 (fourteen) days.   Carlus Pavlov, MD Taking Active Self  hydrochlorothiazide (MICROZIDE) 12.5 MG capsule [id number] Yes Take 1 capsule (12.5 mg total) by mouth daily. Regina Paddy, NP Taking Active Self  insulin aspart (NOVOLOG FLEXPEN) 100 UNIT/ML FlexPen [id number] Yes Inject 5-7 units under skin up to 2x a day  Patient taking differently: Inject 7-9 Units into the skin See admin instructions. Inject 7-9 into the skin twice a day 15 minutes before meals   Carlus Pavlov, MD Taking Active Self  Insulin Glargine [facility] KWIKPEN) 100 UNIT/ML [id number] Yes Inject 22 Units into the skin daily.  Patient taking differently: Inject 22 Units into the skin in the morning.   Carlus Pavlov, MD Taking Active Self  Insulin Pen Needle 32G X 4 MM MISC [id number] Yes Use 4x a day  Patient taking differently: 1 each by Other route See admin instructions. Use 4x a day   Carlus Pavlov, MD Taking Active Self  lisinopril (ZESTRIL) 20 MG tablet [id number] Yes Take 1 tablet (20 mg total) by mouth daily. Follow-up appt due in July  Patient taking differently: Take 20 mg by mouth daily.   Regina Paddy, NP Taking Active Self  ondansetron [facility]) 4 MG tablet [id number] Yes Take 1 tablet (4 mg total) by mouth every 8 (eight) hours as needed for nausea or vomiting. Rai, Delene Ruffini, MD Taking Active   oxyCODONE (OXY IR/ROXICODONE) 5 MG immediate release tablet [id number] Yes Take 0.5-1 tablets (2.5-5 mg total) by mouth every  4 (four) hours as needed for moderate pain. Regina Gilding, MD Taking Active Self  pantoprazole (PROTONIX) 40 MG tablet [id number] Yes Take 1 tablet (40 mg total) by mouth 2 (two) times daily before a meal. Rai, Ripudeep K, MD Taking Active   Semaglutide,0.25 or 0.5MG /DOS, (OZEMPIC, 0.25 OR  0.5 MG/DOSE,) 2 MG/3ML SOPN [id number] Yes Inject 0.25 mg into the skin once a week. Carlus Pavlov, MD Taking Active   sucralfate (CARAFATE) 1 g tablet [id number] Yes Take 1 tablet (1 g total) by mouth 4 (four) times daily -  with meals and at bedtime. Rai, Delene Ruffini, MD Taking Active   SYSTANE HYDRATION PF 0.4-0.3 % SOLN [id number] Yes Place 1 drop into both eyes 3 (three) times daily as needed (for dryness or irritation). [provider] Taking Active Self            Home Care and Equipment/Supplies: Were Home Health Services Ordered?: No Any new equipment or medical supplies ordered?: No  Functional Questionnaire: Do you need assistance with bathing/showering or dressing?: No Do you need assistance with meal preparation?: No Do you need assistance with eating?: No Do you have difficulty maintaining continence: No Do you need assistance with getting out of bed/getting out of a chair/moving?: No Do you have difficulty managing or taking your medications?: No  Follow up appointments reviewed: PCP Follow-up appointment confirmed?: No (refused) MD Provider Line [phone] Given: Yes Specialist Hospital Follow-up appointment confirmed?: No (pt waiting to hear back from Dr Mosetta Putt office to schedule fu appt) Do you need transportation to your follow-up appointment?: No Do you understand care options if your condition(s) worsen?: Yes-patient verbalized understanding    SIGNATURE  Woodfin Ganja LPN [facility] Nurse Health Advisor Direct Dial [phone]

## 2023-01-06 ENCOUNTER — Encounter: Payer: Self-pay | Admitting: Gastroenterology

## 2023-01-06 LAB — SURGICAL PATHOLOGY

## 2023-01-06 NOTE — Progress Notes (Signed)
Ms Hoeffner's sister pam called requesting an appt with Dr Mosetta Putt to review biopsy results.  I schedule her with Santiago Glad, NP tomorrow arrive at 1015 for lab and then [facility] and Dr Mosetta Putt.  All questions were answered.  She verbalized understanding.

## 2023-01-07 ENCOUNTER — Encounter (HOSPITAL_COMMUNITY): Payer: Self-pay | Admitting: Internal Medicine

## 2023-01-07 ENCOUNTER — Other Ambulatory Visit: Payer: Self-pay | Admitting: Nurse Practitioner

## 2023-01-07 ENCOUNTER — Inpatient Hospital Stay: Payer: Federal, State, Local not specified - PPO | Attending: Hematology

## 2023-01-07 ENCOUNTER — Telehealth: Payer: Self-pay

## 2023-01-07 ENCOUNTER — Inpatient Hospital Stay: Payer: Federal, State, Local not specified - PPO

## 2023-01-07 ENCOUNTER — Inpatient Hospital Stay (HOSPITAL_COMMUNITY)
Admission: RE | Admit: 2023-01-07 | Discharge: 2023-01-17 | DRG: 436 | Disposition: A | Discharge status: Home / Self Care | Payer: Medicare Other | Attending: Internal Medicine | Admitting: Internal Medicine

## 2023-01-07 ENCOUNTER — Encounter: Payer: Self-pay | Admitting: Nurse Practitioner

## 2023-01-07 ENCOUNTER — Inpatient Hospital Stay: Payer: Federal, State, Local not specified - PPO | Admitting: Nurse Practitioner

## 2023-01-07 ENCOUNTER — Other Ambulatory Visit: Payer: Self-pay

## 2023-01-07 ENCOUNTER — Inpatient Hospital Stay (HOSPITAL_COMMUNITY): Payer: Medicare Other

## 2023-01-07 ENCOUNTER — Encounter (HOSPITAL_COMMUNITY): Payer: Self-pay

## 2023-01-07 DIAGNOSIS — K8021 Calculus of gallbladder without cholecystitis with obstruction: Secondary | ICD-10-CM | POA: Diagnosis present

## 2023-01-07 DIAGNOSIS — Z538 Procedure and treatment not carried out for other reasons: Secondary | ICD-10-CM | POA: Diagnosis not present

## 2023-01-07 DIAGNOSIS — J90 Pleural effusion, not elsewhere classified: Secondary | ICD-10-CM | POA: Insufficient documentation

## 2023-01-07 DIAGNOSIS — R197 Diarrhea, unspecified: Secondary | ICD-10-CM | POA: Diagnosis present

## 2023-01-07 DIAGNOSIS — R590 Localized enlarged lymph nodes: Secondary | ICD-10-CM | POA: Diagnosis present

## 2023-01-07 DIAGNOSIS — R110 Nausea: Secondary | ICD-10-CM

## 2023-01-07 DIAGNOSIS — Z833 Family history of diabetes mellitus: Secondary | ICD-10-CM

## 2023-01-07 DIAGNOSIS — D132 Benign neoplasm of duodenum: Secondary | ICD-10-CM | POA: Diagnosis not present

## 2023-01-07 DIAGNOSIS — R634 Abnormal weight loss: Secondary | ICD-10-CM | POA: Diagnosis present

## 2023-01-07 DIAGNOSIS — R7881 Bacteremia: Secondary | ICD-10-CM | POA: Diagnosis present

## 2023-01-07 DIAGNOSIS — R918 Other nonspecific abnormal finding of lung field: Secondary | ICD-10-CM | POA: Diagnosis present

## 2023-01-07 DIAGNOSIS — Z9889 Other specified postprocedural states: Secondary | ICD-10-CM

## 2023-01-07 DIAGNOSIS — Z79899 Other long term (current) drug therapy: Secondary | ICD-10-CM

## 2023-01-07 DIAGNOSIS — K2960 Other gastritis without bleeding: Secondary | ICD-10-CM | POA: Diagnosis present

## 2023-01-07 DIAGNOSIS — R978 Other abnormal tumor markers: Secondary | ICD-10-CM | POA: Diagnosis not present

## 2023-01-07 DIAGNOSIS — N200 Calculus of kidney: Secondary | ICD-10-CM | POA: Diagnosis present

## 2023-01-07 DIAGNOSIS — I700 Atherosclerosis of aorta: Secondary | ICD-10-CM | POA: Diagnosis present

## 2023-01-07 DIAGNOSIS — Z6841 Body Mass Index (BMI) 40.0 and over, adult: Secondary | ICD-10-CM

## 2023-01-07 DIAGNOSIS — K3189 Other diseases of stomach and duodenum: Secondary | ICD-10-CM | POA: Diagnosis not present

## 2023-01-07 DIAGNOSIS — Z515 Encounter for palliative care: Secondary | ICD-10-CM | POA: Diagnosis not present

## 2023-01-07 DIAGNOSIS — Z4659 Encounter for fitting and adjustment of other gastrointestinal appliance and device: Secondary | ICD-10-CM | POA: Diagnosis not present

## 2023-01-07 DIAGNOSIS — C799 Secondary malignant neoplasm of unspecified site: Secondary | ICD-10-CM | POA: Diagnosis not present

## 2023-01-07 DIAGNOSIS — K831 Obstruction of bile duct: Secondary | ICD-10-CM | POA: Diagnosis not present

## 2023-01-07 DIAGNOSIS — Z87891 Personal history of nicotine dependence: Secondary | ICD-10-CM

## 2023-01-07 DIAGNOSIS — R160 Hepatomegaly, not elsewhere classified: Secondary | ICD-10-CM | POA: Diagnosis not present

## 2023-01-07 DIAGNOSIS — E042 Nontoxic multinodular goiter: Secondary | ICD-10-CM | POA: Diagnosis present

## 2023-01-07 DIAGNOSIS — B961 Klebsiella pneumoniae [K. pneumoniae] as the cause of diseases classified elsewhere: Secondary | ICD-10-CM | POA: Diagnosis not present

## 2023-01-07 DIAGNOSIS — C801 Malignant (primary) neoplasm, unspecified: Secondary | ICD-10-CM | POA: Diagnosis not present

## 2023-01-07 DIAGNOSIS — K209 Esophagitis, unspecified without bleeding: Secondary | ICD-10-CM | POA: Diagnosis present

## 2023-01-07 DIAGNOSIS — Z888 Allergy status to other drugs, medicaments and biological substances status: Secondary | ICD-10-CM

## 2023-01-07 DIAGNOSIS — R188 Other ascites: Secondary | ICD-10-CM | POA: Diagnosis present

## 2023-01-07 DIAGNOSIS — K30 Functional dyspepsia: Secondary | ICD-10-CM | POA: Diagnosis present

## 2023-01-07 DIAGNOSIS — K760 Fatty (change of) liver, not elsewhere classified: Secondary | ICD-10-CM | POA: Diagnosis not present

## 2023-01-07 DIAGNOSIS — C221 Intrahepatic bile duct carcinoma: Principal | ICD-10-CM | POA: Diagnosis present

## 2023-01-07 DIAGNOSIS — I10 Essential (primary) hypertension: Secondary | ICD-10-CM | POA: Insufficient documentation

## 2023-01-07 DIAGNOSIS — B1920 Unspecified viral hepatitis C without hepatic coma: Secondary | ICD-10-CM | POA: Diagnosis not present

## 2023-01-07 DIAGNOSIS — K838 Other specified diseases of biliary tract: Secondary | ICD-10-CM | POA: Diagnosis not present

## 2023-01-07 DIAGNOSIS — R911 Solitary pulmonary nodule: Secondary | ICD-10-CM | POA: Diagnosis not present

## 2023-01-07 DIAGNOSIS — K769 Liver disease, unspecified: Secondary | ICD-10-CM | POA: Diagnosis not present

## 2023-01-07 DIAGNOSIS — E860 Dehydration: Secondary | ICD-10-CM | POA: Diagnosis not present

## 2023-01-07 DIAGNOSIS — Z880 Allergy status to penicillin: Secondary | ICD-10-CM

## 2023-01-07 DIAGNOSIS — E119 Type 2 diabetes mellitus without complications: Secondary | ICD-10-CM | POA: Diagnosis not present

## 2023-01-07 DIAGNOSIS — Z9089 Acquired absence of other organs: Secondary | ICD-10-CM | POA: Diagnosis not present

## 2023-01-07 DIAGNOSIS — Z86011 Personal history of benign neoplasm of the brain: Secondary | ICD-10-CM

## 2023-01-07 DIAGNOSIS — Z794 Long term (current) use of insulin: Secondary | ICD-10-CM

## 2023-01-07 DIAGNOSIS — Z96659 Presence of unspecified artificial knee joint: Secondary | ICD-10-CM | POA: Diagnosis present

## 2023-01-07 DIAGNOSIS — R591 Generalized enlarged lymph nodes: Secondary | ICD-10-CM | POA: Diagnosis present

## 2023-01-07 DIAGNOSIS — N179 Acute kidney failure, unspecified: Secondary | ICD-10-CM | POA: Diagnosis not present

## 2023-01-07 DIAGNOSIS — D259 Leiomyoma of uterus, unspecified: Secondary | ICD-10-CM | POA: Diagnosis present

## 2023-01-07 DIAGNOSIS — E1139 Type 2 diabetes mellitus with other diabetic ophthalmic complication: Secondary | ICD-10-CM | POA: Diagnosis present

## 2023-01-07 DIAGNOSIS — Z7985 Long-term (current) use of injectable non-insulin antidiabetic drugs: Secondary | ICD-10-CM

## 2023-01-07 LAB — CBC WITH DIFFERENTIAL (CANCER CENTER ONLY)
Abs Immature Granulocytes: 0.04 10*3/uL (ref 0.00–0.07)
Basophils Absolute: 0 10*3/uL (ref 0.0–0.1)
Basophils Relative: 0 %
Eosinophils Absolute: 0 10*3/uL (ref 0.0–0.5)
Eosinophils Relative: 1 %
HCT: 40.2 % (ref 36.0–46.0)
Hemoglobin: 14 g/dL (ref 12.0–15.0)
Immature Granulocytes: 1 %
Lymphocytes Relative: 13 %
Lymphs Abs: 0.9 10*3/uL (ref 0.7–4.0)
MCH: 29.5 pg (ref 26.0–34.0)
MCHC: 34.8 g/dL (ref 30.0–36.0)
MCV: 84.8 fL (ref 80.0–100.0)
Monocytes Absolute: 1 10*3/uL (ref 0.1–1.0)
Monocytes Relative: 13 %
Neutro Abs: 5.5 10*3/uL (ref 1.7–7.7)
Neutrophils Relative %: 72 %
Platelet Count: 230 10*3/uL (ref 150–400)
RBC: 4.74 MIL/uL (ref 3.87–5.11)
RDW: 15.9 % — ABNORMAL HIGH (ref 11.5–15.5)
WBC Count: 7.5 10*3/uL (ref 4.0–10.5)
nRBC: 0 % (ref 0.0–0.2)

## 2023-01-07 LAB — CMP (CANCER CENTER ONLY)
ALT: 38 U/L (ref 0–44)
AST: 65 U/L — ABNORMAL HIGH (ref 15–41)
Albumin: 3.2 g/dL — ABNORMAL LOW (ref 3.5–5.0)
Alkaline Phosphatase: 175 U/L — ABNORMAL HIGH (ref 38–126)
Anion gap: 8 (ref 5–15)
BUN: 9 mg/dL (ref 8–23)
CO2: 29 mmol/L (ref 22–32)
Calcium: 8.7 mg/dL — ABNORMAL LOW (ref 8.9–10.3)
Chloride: 100 mmol/L (ref 98–111)
Creatinine: 0.91 mg/dL (ref 0.44–1.00)
GFR, Estimated: >60 mL/min (ref >60)
Glucose, Bld: 130 mg/dL — ABNORMAL HIGH (ref 70–99)
Potassium: 3.5 mmol/L (ref 3.5–5.1)
Sodium: 137 mmol/L (ref 135–145)
Total Bilirubin: 14.1 mg/dL (ref 0.3–1.2)
Total Protein: 6.8 g/dL (ref 6.5–8.1)

## 2023-01-07 LAB — GLUCOSE, CAPILLARY
Glucose-Capillary: 90 mg/dL (ref 70–99)
Glucose-Capillary: 72 mg/dL (ref 70–99)
Glucose-Capillary: 97 mg/dL (ref 70–99)
Glucose-Capillary: 146 mg/dL — ABNORMAL HIGH (ref 70–99)

## 2023-01-07 LAB — HEPATIC FUNCTION PANEL
ALT: 42 U/L (ref 0–44)
AST: 74 U/L — ABNORMAL HIGH (ref 15–41)
Albumin: 2.7 g/dL — ABNORMAL LOW (ref 3.5–5.0)
Alkaline Phosphatase: 148 U/L — ABNORMAL HIGH (ref 38–126)
Bilirubin, Direct: 8 mg/dL — ABNORMAL HIGH (ref 0.0–0.2)
Total Bilirubin: 15.3 mg/dL — ABNORMAL HIGH (ref 0.3–1.2)
Total Protein: 6.6 g/dL (ref 6.5–8.1)

## 2023-01-07 LAB — C DIFFICILE QUICK SCREEN W PCR REFLEX
C Diff antigen: NEGATIVE
C Diff interpretation: NOT DETECTED
C Diff toxin: NEGATIVE

## 2023-01-07 LAB — SAMPLE TO BLOOD BANK

## 2023-01-07 LAB — HEPATITIS A ANTIBODY, TOTAL: hep A Total Ab: NONREACTIVE

## 2023-01-07 LAB — HEPATITIS B CORE ANTIBODY, TOTAL: Hep B Core Total Ab: NONREACTIVE

## 2023-01-07 LAB — HIV ANTIBODY (ROUTINE TESTING W REFLEX): HIV Screen 4th Generation wRfx: NONREACTIVE

## 2023-01-07 MED ORDER — RISAQUAD PO CAPS
1.0000 | ORAL_CAPSULE | Freq: Every day | ORAL | Status: DC
  Administered 2023-01-07 – 2023-01-17 (×9): 1 via ORAL
  Filled 2023-01-07 (×10): qty 1

## 2023-01-07 MED ORDER — PEG-KCL-NACL-NASULF-NA ASC-C 100 G PO SOLR: 0.5000 | Freq: Once | ORAL | Status: DC

## 2023-01-07 MED ORDER — AMLODIPINE BESYLATE 10 MG PO TABS
10.0000 mg | ORAL_TABLET | Freq: Every day | ORAL | Status: DC
  Administered 2023-01-08 – 2023-01-17 (×10): 10 mg via ORAL
  Filled 2023-01-07 (×10): qty 1

## 2023-01-07 MED ORDER — OXYCODONE HCL 5 MG PO TABS
2.5000 mg | ORAL_TABLET | ORAL | Status: DC | PRN
  Administered 2023-01-16: 2.5 mg via ORAL
  Administered 2023-01-17: 5 mg via ORAL
  Filled 2023-01-07 (×2): qty 1

## 2023-01-07 MED ORDER — PEG-KCL-NACL-NASULF-NA ASC-C 100 G PO SOLR: 1.0000 | Freq: Once | ORAL | Status: DC

## 2023-01-07 MED ORDER — PANTOPRAZOLE SODIUM 40 MG PO TBEC
40.0000 mg | DELAYED_RELEASE_TABLET | Freq: Two times a day (BID) | ORAL | Status: DC
  Administered 2023-01-07 – 2023-01-17 (×16): 40 mg via ORAL
  Filled 2023-01-07 (×17): qty 1

## 2023-01-07 MED ORDER — HYDROCHLOROTHIAZIDE 12.5 MG PO TABS
12.5000 mg | ORAL_TABLET | Freq: Every day | ORAL | Status: DC
  Administered 2023-01-08: 12.5 mg via ORAL
  Filled 2023-01-07 (×2): qty 1

## 2023-01-07 MED ORDER — SUCRALFATE 1 G PO TABS: 1.0000 g | ORAL_TABLET | Freq: Three times a day (TID) | ORAL | 1 refills | Status: DC

## 2023-01-07 MED ORDER — SUCRALFATE 1 G PO TABS
1.0000 g | ORAL_TABLET | Freq: Three times a day (TID) | ORAL | Status: DC
  Administered 2023-01-07 – 2023-01-17 (×23): 1 g via ORAL
  Filled 2023-01-07 (×28): qty 1

## 2023-01-07 MED ORDER — LOPERAMIDE HCL 2 MG PO CAPS: 2.0000 mg | ORAL_CAPSULE | ORAL | Status: DC | PRN

## 2023-01-07 MED ORDER — ACETAMINOPHEN 325 MG PO TABS
650.0000 mg | ORAL_TABLET | Freq: Four times a day (QID) | ORAL | Status: DC | PRN
  Administered 2023-01-09: 650 mg via ORAL
  Filled 2023-01-07: qty 2

## 2023-01-07 MED ORDER — ONDANSETRON HCL 4 MG/2ML IJ SOLN
4.0000 mg | Freq: Four times a day (QID) | INTRAMUSCULAR | Status: DC | PRN
  Administered 2023-01-08 – 2023-01-17 (×5): 4 mg via INTRAVENOUS
  Filled 2023-01-07 (×6): qty 2

## 2023-01-07 MED ORDER — ALBUTEROL SULFATE (2.5 MG/3ML) 0.083% IN NEBU: 2.5000 mg | INHALATION_SOLUTION | RESPIRATORY_TRACT | Status: DC | PRN

## 2023-01-07 MED ORDER — SODIUM CHLORIDE 0.9 % IV SOLN: INTRAVENOUS | Status: DC

## 2023-01-07 MED ORDER — GADOBUTROL 1 MMOL/ML IV SOLN
10.0000 mL | Freq: Once | INTRAVENOUS | Status: AC | PRN
  Administered 2023-01-07: 10 mL via INTRAVENOUS

## 2023-01-07 MED ORDER — ENOXAPARIN SODIUM 60 MG/0.6ML IJ SOSY
50.0000 mg | PREFILLED_SYRINGE | INTRAMUSCULAR | Status: DC
  Administered 2023-01-07 – 2023-01-11 (×5): 50 mg via SUBCUTANEOUS
  Filled 2023-01-07 (×5): qty 0.6

## 2023-01-07 MED ORDER — LISINOPRIL 20 MG PO TABS
20.0000 mg | ORAL_TABLET | Freq: Every day | ORAL | Status: DC
  Administered 2023-01-08 – 2023-01-17 (×10): 20 mg via ORAL
  Filled 2023-01-07 (×10): qty 1

## 2023-01-07 MED ORDER — PEG-KCL-NACL-NASULF-NA ASC-C 100 G PO SOLR
0.5000 | Freq: Once | ORAL | Status: DC
  Filled 2023-01-07: qty 1

## 2023-01-07 MED ORDER — ONDANSETRON HCL 4 MG PO TABS
4.0000 mg | ORAL_TABLET | Freq: Four times a day (QID) | ORAL | Status: DC | PRN
  Filled 2023-01-07: qty 1

## 2023-01-07 MED ORDER — ACETAMINOPHEN 650 MG RE SUPP: 650.0000 mg | Freq: Four times a day (QID) | RECTAL | Status: DC | PRN

## 2023-01-07 MED ORDER — ONDANSETRON HCL 4 MG/2ML IJ SOLN
8.0000 mg | Freq: Once | INTRAMUSCULAR | Status: DC
  Filled 2023-01-07: qty 4

## 2023-01-07 MED ORDER — INSULIN ASPART 100 UNIT/ML IJ SOLN
0.0000 [IU] | Freq: Every day | INTRAMUSCULAR | Status: DC
  Administered 2023-01-09: 2 [IU] via SUBCUTANEOUS

## 2023-01-07 MED ORDER — INSULIN GLARGINE-YFGN 100 UNIT/ML ~~LOC~~ SOLN
10.0000 [IU] | Freq: Every day | SUBCUTANEOUS | Status: DC
  Administered 2023-01-08 – 2023-01-17 (×9): 10 [IU] via SUBCUTANEOUS
  Filled 2023-01-07 (×10): qty 0.1

## 2023-01-07 MED ORDER — LORAZEPAM 2 MG/ML IJ SOLN
0.5000 mg | INTRAMUSCULAR | Status: AC | PRN
  Administered 2023-01-07: 0.5 mg via INTRAVENOUS
  Filled 2023-01-07: qty 1

## 2023-01-07 MED ORDER — INSULIN ASPART 100 UNIT/ML IJ SOLN
0.0000 [IU] | Freq: Three times a day (TID) | INTRAMUSCULAR | Status: DC
  Administered 2023-01-08 – 2023-01-10 (×2): 2 [IU] via SUBCUTANEOUS
  Administered 2023-01-10: 3 [IU] via SUBCUTANEOUS
  Administered 2023-01-10: 5 [IU] via SUBCUTANEOUS
  Administered 2023-01-11: 3 [IU] via SUBCUTANEOUS
  Administered 2023-01-11: 2 [IU] via SUBCUTANEOUS
  Administered 2023-01-11: 3 [IU] via SUBCUTANEOUS
  Administered 2023-01-12 – 2023-01-13 (×3): 2 [IU] via SUBCUTANEOUS
  Administered 2023-01-14 – 2023-01-15 (×2): 3 [IU] via SUBCUTANEOUS
  Administered 2023-01-15 – 2023-01-16 (×3): 2 [IU] via SUBCUTANEOUS
  Administered 2023-01-16: 1 [IU] via SUBCUTANEOUS
  Administered 2023-01-17: 3 [IU] via SUBCUTANEOUS

## 2023-01-07 NOTE — H&P (Signed)
History and Physical  Regina Mckay ZOX:[id number] DOB: 1951/02/26 DOA: 01/07/2023  PCP: [name], NP   Chief Complaint: elevated bilirubin   HPI: Regina Mckay is a 72 y.o. female with medical history significant for obesity, hypertension, insulin-dependent type 2 diabetes admitted to the hospital with continued weakness and elevated bilirubin.  She was admitted to [facility] 5/18 to 5/22 during which time she was diagnosed with Klebsiella bacteremia of unknown origin.  He was then again admitted to the hospital with diarrhea, weakness, and elevated T. bili of 9 from 5/28 to 5/31.  During that hospital admission, she was seen by gastroenterology, she underwent EUS with brushings and biopsies which were normal.  At that hospital discharge, she was instructed to continue p.o. Augmentin for her recent Klebsiella bacteremia for 4 more days, and completed the course yesterday.  Today she came to oncology clinic for routine follow-up, where she complained of continued nausea, diarrhea, severe weakness and continued weight loss.  Lab work was done, showed T. bili 14.1.  Given all of the above complaints, patient was directed to the hospital for direct admission to the hospitalist service.  Review of Systems: Please see HPI for pertinent positives and negatives. A complete 10 system review of systems are otherwise negative.  Past Medical History:  Diagnosis Date   Diabetes mellitus without complication (HCC)    Hypertension    Past Surgical History:  Procedure Laterality Date   APPLICATION OF CRANIAL NAVIGATION N/A 10/11/2020   Procedure: APPLICATION OF CRANIAL NAVIGATION;  Surgeon: Bedelia Person, MD;  Location: [facility] OR;  Service: Neurosurgery;  Laterality: N/A;   BIOPSY  01/01/2023   Procedure: BIOPSY;  Surgeon: [name]., MD;  Location: [facility];  Service: Gastroenterology;;   Monica Becton N/A 10/11/2020   Procedure: CRANIOTOMY FOR RESECTION OF MENINGIOMA;  Surgeon:  Bedelia Person, MD;  Location: [facility] OR;  Service: Neurosurgery;  Laterality: N/A;   ESOPHAGOGASTRODUODENOSCOPY (EGD) WITH PROPOFOL N/A 01/01/2023   Procedure: ESOPHAGOGASTRODUODENOSCOPY (EGD) WITH PROPOFOL;  Surgeon: Meridee Score Netty Starring., MD;  Location: [facility] ENDOSCOPY;  Service: Gastroenterology;  Laterality: N/A;   EUS N/A 01/01/2023   Procedure: UPPER ENDOSCOPIC ULTRASOUND (EUS) LINEAR;  Surgeon: [name]., MD;  Location: [facility] ENDOSCOPY;  Service: Gastroenterology;  Laterality: N/A;   FINE NEEDLE ASPIRATION N/A 01/01/2023   Procedure: FINE NEEDLE ASPIRATION (FNA) LINEAR;  Surgeon: [name]., MD;  Location: [facility] ENDOSCOPY;  Service: Gastroenterology;  Laterality: N/A;   REPLACEMENT TOTAL KNEE     TONSILLECTOMY      Social History:  reports that she has quit smoking. Her smoking use included cigarettes. She has never used smokeless tobacco. She reports that she does not currently use alcohol. She reports that she does not use drugs.   Allergies  Allergen Reactions   Augmentin [Amoxicillin-Pot Clavulanate] Nausea Only and Other (See Comments)    Severe nausea that requires Zofran to tolerate   Lidoderm [Lidocaine] Other (See Comments)    The 5% patch increased the BGL    Family History  Problem Relation Age of Onset   Alcohol abuse Mother    Arthritis Mother    Stroke Mother    Alcohol abuse Father    Arthritis Father    Alcohol abuse Sister    Diabetes Sister    Alcohol abuse Sister    Arthritis Sister    Alcohol abuse Brother    Alcohol abuse Brother    Alcohol abuse Brother    Diabetes Brother  Cancer Paternal Aunt        unknown type cancer   Alcohol abuse Daughter      Prior to Admission medications   Medication Sig Start Date End Date Taking? Authorizing Provider  amLODipine (NORVASC) 10 MG tablet Take 1 tablet (10 mg total) by mouth daily. Follow-up appt due in July must see provider for future refills Patient taking differently: Take 10 mg  by mouth daily. 11/06/22   [name], NP  amoxicillin-clavulanate (AUGMENTIN) 875-125 MG tablet Take 1 tablet by mouth 2 (two) times daily for 14 days. Continue for 4 more days, then stop 01/02/23 01/16/23  Rai, Delene Ruffini, MD  bismuth subsalicylate (PEPTO BISMOL) 262 MG chewable tablet Chew 524 mg by mouth as needed for indigestion or diarrhea or loose stools.    [provider]  cholecalciferol (VITAMIN D3) 10 MCG (400 UNIT) TABS tablet Take 400 Units by mouth daily.    [provider]  Continuous Blood Gluc Receiver (FREESTYLE LIBRE 2 READER) DEVI 1 each by Does not apply route daily. 10/25/21   Carlus Pavlov, MD  Continuous Blood Gluc Sensor (FREESTYLE LIBRE 2 SENSOR) MISC USE 1 SENSOR EVERY 14 DAYS AS DIRECTED Patient taking differently: Inject 1 Device into the skin every 14 (fourteen) days. 09/30/22   Carlus Pavlov, MD  hydrochlorothiazide (MICROZIDE) 12.5 MG capsule Take 1 capsule (12.5 mg total) by mouth daily. 05/28/22   [name], NP  insulin aspart (NOVOLOG FLEXPEN) 100 UNIT/ML FlexPen Inject 5-7 units under skin up to 2x a day Patient taking differently: Inject 7-9 Units into the skin See admin instructions. Inject 7-9 into the skin twice a day 15 minutes before meals 05/06/22   Carlus Pavlov, MD  Insulin Glargine [facility]) 100 UNIT/ML Inject 22 Units into the skin daily. Patient taking differently: Inject 22 Units into the skin in the morning. 05/06/22   Carlus Pavlov, MD  Insulin Pen Needle 32G X 4 MM MISC Use 4x a day Patient taking differently: 1 each by Other route See admin instructions. Use 4x a day 09/13/21   Carlus Pavlov, MD  lisinopril (ZESTRIL) 20 MG tablet Take 1 tablet (20 mg total) by mouth daily. Follow-up appt due in July Patient taking differently: Take 20 mg by mouth daily. 11/17/22   [name], NP  ondansetron (ZOFRAN) 4 MG tablet Take 1 tablet (4 mg total) by mouth every 8 (eight) hours as needed for nausea or  vomiting. 01/02/23   Rai, Ripudeep K, MD  oxyCODONE (OXY IR/ROXICODONE) 5 MG immediate release tablet Take 0.5-1 tablets (2.5-5 mg total) by mouth every 4 (four) hours as needed for moderate pain. 12/24/22   Leatha Gilding, MD  pantoprazole (PROTONIX) 40 MG tablet Take 1 tablet (40 mg total) by mouth 2 (two) times daily before a meal. 01/02/23   Rai, Ripudeep K, MD  Semaglutide,0.25 or 0.5MG /DOS, (OZEMPIC, 0.25 OR 0.5 MG/DOSE,) 2 MG/3ML SOPN Inject 0.25 mg into the skin once a week. 01/05/23   Carlus Pavlov, MD  sucralfate (CARAFATE) 1 g tablet Take 1 tablet (1 g total) by mouth 4 (four) times daily -  with meals and at bedtime. 01/07/23 03/08/23  Pollyann Samples, NP  SYSTANE HYDRATION PF 0.4-0.3 % SOLN Place 1 drop into both eyes 3 (three) times daily as needed (for dryness or irritation).    [provider]    Physical Exam: BP 138/64   Pulse 81   Temp 98.3 F (36.8 C) (Oral)   Resp  17   Ht 5\' 3"  (1.6 m)   Wt 101 kg   BMI 39.44 kg/m   General:  Alert, oriented, calm, in no acute distress, sister at the bedside, she looks comfortable and not in acute distress Eyes: EOMI, clear conjuctivae, white sclerea Neck: supple, no masses, trachea mildline  Cardiovascular: RRR, no murmurs or rubs, no peripheral edema  Respiratory: clear to auscultation bilaterally, no wheezes, no crackles  Abdomen: soft, nontender, nondistended, normal bowel tones heard  Skin: dry, no rashes  Musculoskeletal: no joint effusions, normal range of motion  Psychiatric: appropriate affect, normal speech  Neurologic: extraocular muscles intact, clear speech, moving all extremities with intact sensorium          Labs on Admission:  Basic Metabolic Panel: Recent Labs  Lab 01/01/23 0614 01/02/23 0644 01/07/23 0954  NA 137 137 137  K 3.2* 3.4* 3.5  CL 102 102 100  CO2 27 25 29   GLUCOSE 110* 109* 130*  BUN 7* 10 9  CREATININE 0.58 0.70 0.91  CALCIUM 7.9* 8.2* 8.7*  MG 1.8 1.7  --    Liver Function  Tests: Recent Labs  Lab 01/01/23 0614 01/02/23 0644 01/07/23 0954  AST 59* 58* 65*  ALT 48* 45* 38  ALKPHOS 123 122 175*  BILITOT 7.5* 8.7* 14.1*  PROT 6.0* 6.4* 6.8  ALBUMIN 2.4* 2.4* 3.2*   No results for input(s): "LIPASE", "AMYLASE" in the last 168 hours. No results for input(s): "AMMONIA" in the last 168 hours. CBC: Recent Labs  Lab 01/01/23 0614 01/02/23 0644 01/07/23 0954  WBC 8.0 12.6* 7.5  NEUTROABS 6.2 10.6* 5.5  HGB 12.3 13.3 14.0  HCT 37.1 39.5 40.2  MCV 88.8 88.2 84.8  PLT 234 255 230   Cardiac Enzymes: No results for input(s): "CKTOTAL", "CKMB", "CKMBINDEX", "TROPONINI" in the last 168 hours.  BNP (last 3 results) Recent Labs    12/23/22 1813  BNP 46.9    ProBNP (last 3 results) No results for input(s): "PROBNP" in the last 8760 hours.  CBG: Recent Labs  Lab 01/01/23 1435 01/01/23 1716 01/01/23 2119 01/02/23 1022 01/02/23 1153  GLUCAP 87 80 120* 136* 157*    Radiological Exams on Admission: No results found.  Assessment/Plan DANNIA EWIG is a 72 y.o. female with medical history significant for obesity, hypertension, insulin-dependent type 2 diabetes admitted to the hospital with continued weakness and elevated bilirubin.  No recent CT abdomen pelvis on 5/18 with multiple low-density lesions throughout the liver and abdominal lymphadenopathy.  She also had MRCP on 5/20 with liver lesions, bulky lymphadenopathy in the hepatoduodenal ligament and retroperitoneally.  There was no evidence for choledocholithiasis. -Inpatient admission -Note C. difficile negative -Imodium as needed -Appreciate GI consultation, they plan colonoscopy  -Further workup to follow the above  Dependent type 2 diabetes-diabetic diet when eating, continue reduced dose basal insulin, with sliding scale  Diarrhea and weight loss-unclear etiology, no unremarkable electrolytes and renal function is normal.  More recently, diarrhea may be due to antibiotics. -As needed  Imodium -Probiotic  Hypertension-continue home HCTZ  Suspected metastatic disease-with liver lesions, and lymphadenopathy  Hyperbilirubinemia-unclear etiology, workup per GI as above.  Depending on the above and her clinical course, patient may benefit from outpatient hepatology evaluation at a tertiary care center.  DVT prophylaxis: Lovenox     Code Status: Full Code  Consults called: Gastroenterology  Admission status: The appropriate patient status for this patient is INPATIENT. Inpatient status is judged to be reasonable and necessary in order  to provide the required intensity of service to ensure the patient's safety. The patient's presenting symptoms, physical exam findings, and initial radiographic and laboratory data in the context of their chronic comorbidities is felt to place them at high risk for further clinical deterioration. Furthermore, it is not anticipated that the patient will be medically stable for discharge from the hospital within 2 midnights of admission.    I certify that at the point of admission it is my clinical judgment that the patient will require inpatient hospital care spanning beyond 2 midnights from the point of admission due to high intensity of service, high risk for further deterioration and high frequency of surveillance required   Time spent: 56 minutes  Lalani Winkles Sharlette Dense MD [facility] Pager [phone]  If 7PM-7AM, please contact night-coverage [email or web address] Password [email or web address]  01/07/2023, 2:17 PM

## 2023-01-07 NOTE — Telephone Encounter (Signed)
Critical lab value reported: Tbili 14.1   Dr. Mosetta Putt and Santiago Glad, NP made aware of critical lab value.

## 2023-01-07 NOTE — Consult Note (Addendum)
Consultation  Referring Provider:  [facility]  Primary Care Physician:  [name], NP Primary Gastroenterologist:  Gentry Fitz      (Seen by Dr. Tomasa Rand inpatient - [facility]) Reason for Consultation:   Hyperbilirubinemia  LOS: 0 days          HPI:   Regina Mckay is a 72 y.o. 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  Recently admitted to [facility] 5/18-5/22 with nausea, vomiting, diarrhea.  On Ozempic (for 2 years).  She was found to have Klebsiella positive blood cultures.  Suspected biliary source since during that time patient had elevated bilirubin (2.2) and gram-negative bacteremia.  MRCP at that time with no choledocholithiasis.  Had some gallbladder wall thickening and mural edema as well as stones.  Surgery was consulted and recommended against HIDA scan given low clinical suspicion for cholecystitis with absence of abdominal pain.  ID was then consulted and patient was placed on Augmentin for 14 days on discharge   CT chest/ab/pelvis during admission was concerning for metastatic malignancy with multiple lesions throughout lungs, liver, and abdomen. CEA elevated (18.9).  AFP mildly elevated (10.2).   She underwent liver biopsy, biopsy was negative for malignant cells.  Oncology reports this could be false negative.  Patient re-admitted for hyperbilirubinemia (9) 5/29.  MRI/MRCP showed multiple hypoenhancing liver lesions, highly concerning for metastatic disease, and unchanged bulky abdominal adenopathy. Cholelithiasis without acute cholecystitis, and no biliary ductal dilatation.  Underwent Upper EUS with Dr. Meridee Score 5/30.  ERCP not necessary due to lack of biliary dilation. EGD: No gross lesions in esophagus, LA grade C erosive esophagitis with no bleeding, 3 cm hiatal hernia, erosive gastritis.  No gross lesions in  duodenum. EUS: Many enlarged malignant appearing lymph nodes in the celiac region, peripancreatic region, and porta hepatis region.  Normal pancreatic duct, pancreatic head, pancreatic tail.  Sludge in gallbladder EUS with FNA of peripancreatic and portal LNs negative   Patient with family at bedside, sister. Provided some of the history.   Seen today due to hyperbilirubinemia after outpatient visit with oncology.  Bilirubin 14.  Oncology direct admitted patient for further evaluation. AST 65/ALT 38/alk phos 175 No leukocytosis C. difficile negative Hepatitis panel pending  No previous EGD/Colonoscopy. Patient are an orange around 6 am this morning, otherwise has not had anything else to eat.  Past Medical History:  Diagnosis Date   Diabetes mellitus without complication (HCC)    Hypertension     Surgical History:  She  has a past surgical history that includes Replacement total knee; Tonsillectomy; Craniotomy (N/A, 10/11/2020); Application of cranial navigation (N/A, 10/11/2020); EUS (N/A, 01/01/2023); Esophagogastroduodenoscopy (egd) with propofol (N/A, 01/01/2023); biopsy (01/01/2023); and Fine needle aspiration (N/A, 01/01/2023). Family History:  Her family history includes Alcohol abuse in her brother, brother, brother, daughter, father, mother, sister, and sister; Arthritis in her father, mother, and sister; Cancer in her paternal aunt; Diabetes in her brother and sister; Stroke in her mother. Social History:   reports that she has quit smoking. Her smoking use included cigarettes. She has never used smokeless tobacco. She reports that she does not currently use alcohol. She reports that she does not use drugs.  Prior to Admission medications   Medication Sig Start Date End Date Taking? Authorizing Provider  amLODipine (NORVASC) 10 MG tablet Take 1 tablet (10 mg total) by mouth daily. Follow-up appt  due in July must see provider for future refills Patient taking differently: Take 10 mg  by mouth daily. 11/06/22   [name], NP  amoxicillin-clavulanate (AUGMENTIN) 875-125 MG tablet Take 1 tablet by mouth 2 (two) times daily for 14 days. Continue for 4 more days, then stop 01/02/23 01/16/23  Rai, Delene Ruffini, MD  bismuth subsalicylate (PEPTO BISMOL) 262 MG chewable tablet Chew 524 mg by mouth as needed for indigestion or diarrhea or loose stools.    [provider]  cholecalciferol (VITAMIN D3) 10 MCG (400 UNIT) TABS tablet Take 400 Units by mouth daily.    [provider]  Continuous Blood Gluc Receiver (FREESTYLE LIBRE 2 READER) DEVI 1 each by Does not apply route daily. 10/25/21   Carlus Pavlov, MD  Continuous Blood Gluc Sensor (FREESTYLE LIBRE 2 SENSOR) MISC USE 1 SENSOR EVERY 14 DAYS AS DIRECTED Patient taking differently: Inject 1 Device into the skin every 14 (fourteen) days. 09/30/22   Carlus Pavlov, MD  hydrochlorothiazide (MICROZIDE) 12.5 MG capsule Take 1 capsule (12.5 mg total) by mouth daily. 05/28/22   [name], NP  insulin aspart (NOVOLOG FLEXPEN) 100 UNIT/ML FlexPen Inject 5-7 units under skin up to 2x a day Patient taking differently: Inject 7-9 Units into the skin See admin instructions. Inject 7-9 into the skin twice a day 15 minutes before meals 05/06/22   Carlus Pavlov, MD  Insulin Glargine [facility]) 100 UNIT/ML Inject 22 Units into the skin daily. Patient taking differently: Inject 22 Units into the skin in the morning. 05/06/22   Carlus Pavlov, MD  Insulin Pen Needle 32G X 4 MM MISC Use 4x a day Patient taking differently: 1 each by Other route See admin instructions. Use 4x a day 09/13/21   Carlus Pavlov, MD  lisinopril (ZESTRIL) 20 MG tablet Take 1 tablet (20 mg total) by mouth daily. Follow-up appt due in July Patient taking differently: Take 20 mg by mouth daily. 11/17/22   [name], NP  ondansetron (ZOFRAN) 4 MG tablet Take 1 tablet (4 mg total) by mouth every 8 (eight) hours as needed for nausea or  vomiting. 01/02/23   Rai, Ripudeep K, MD  oxyCODONE (OXY IR/ROXICODONE) 5 MG immediate release tablet Take 0.5-1 tablets (2.5-5 mg total) by mouth every 4 (four) hours as needed for moderate pain. 12/24/22   Leatha Gilding, MD  pantoprazole (PROTONIX) 40 MG tablet Take 1 tablet (40 mg total) by mouth 2 (two) times daily before a meal. 01/02/23   Rai, Ripudeep K, MD  Semaglutide,0.25 or 0.5MG /DOS, (OZEMPIC, 0.25 OR 0.5 MG/DOSE,) 2 MG/3ML SOPN Inject 0.25 mg into the skin once a week. 01/05/23   Carlus Pavlov, MD  sucralfate (CARAFATE) 1 g tablet Take 1 tablet (1 g total) by mouth 4 (four) times daily -  with meals and at bedtime. 01/07/23 03/08/23  Pollyann Samples, NP  SYSTANE HYDRATION PF 0.4-0.3 % SOLN Place 1 drop into both eyes 3 (three) times daily as needed (for dryness or irritation).    [provider]    Current Facility-Administered Medications  Medication Dose Route Frequency Provider Last Rate Last Admin   acetaminophen (TYLENOL) tablet 650 mg  650 mg Oral Q6H PRN Kirby Crigler, Mir M, MD       Or   acetaminophen (TYLENOL) suppository 650 mg  650 mg Rectal Q6H PRN Kirby Crigler, Mir M, MD       albuterol (PROVENTIL) (2.5 MG/3ML) 0.083% nebulizer solution 2.5 mg  2.5 mg Nebulization Q2H  PRN Maryln Gottron, MD       Melene Muller ON 01/08/2023] amLODipine (NORVASC) tablet 10 mg  10 mg Oral Daily Kirby Crigler, Mir M, MD       enoxaparin (LOVENOX) injection 50 mg  50 mg Subcutaneous Q24H Kirby Crigler, Mir M, MD       insulin aspart (novoLOG) injection 0-15 Units  0-15 Units Subcutaneous TID WC Kirby Crigler, Mir M, MD       insulin aspart (novoLOG) injection 0-5 Units  0-5 Units Subcutaneous QHS Kirby Crigler, Mir M, MD       Melene Muller ON 01/08/2023] lisinopril (ZESTRIL) tablet 20 mg  20 mg Oral Daily Kirby Crigler, Mir M, MD       ondansetron [facility]) tablet 4 mg  4 mg Oral Q6H PRN Kirby Crigler, Mir M, MD       Or   ondansetron [facility]) injection 4 mg  4 mg Intravenous Q6H PRN Kirby Crigler, Mir M, MD        oxyCODONE (Oxy IR/ROXICODONE) immediate release tablet 2.5-5 mg  2.5-5 mg Oral Q4H PRN Kirby Crigler, Mir M, MD       pantoprazole (PROTONIX) EC tablet 40 mg  40 mg Oral BID AC Ikramullah, Mir M, MD       sucralfate (CARAFATE) tablet 1 g  1 g Oral TID WC & HS Kirby Crigler, Mir M, MD        Allergies as of 01/07/2023 - Review Complete 01/07/2023  Allergen Reaction Noted   Augmentin [amoxicillin-pot clavulanate] Nausea Only and Other (See Comments) 12/30/2022   Lidoderm [lidocaine] Other (See Comments) 12/30/2022    Review of Systems  Constitutional:  Negative for chills, fever and weight loss.  HENT:  Negative for hearing loss and tinnitus.   Eyes:  Negative for blurred vision and double vision.  Respiratory:  Negative for cough and hemoptysis.   Cardiovascular:  Negative for chest pain and palpitations.  Gastrointestinal:  Negative for abdominal pain, blood in stool, constipation, diarrhea, heartburn, melena, nausea and vomiting.  Genitourinary:  Negative for dysuria and urgency.  Musculoskeletal:  Negative for myalgias and neck pain.  Skin:  Negative for itching and rash.  Neurological:  Negative for seizures and loss of consciousness.  Psychiatric/Behavioral:  Negative for depression and suicidal ideas.        Physical Exam:  Vital signs in last 24 hours: Temp:  [98.3 F (36.8 C)] 98.3 F (36.8 C) (06/05 1329) Pulse Rate:  [81] 81 (06/05 1329) Resp:  [17] 17 (06/05 1329) BP: (138)/(64) 138/64 (06/05 1329) SpO2:  [98 %] 98 % (06/05 1053) Weight:  [101 kg-101.9 kg] 101 kg (06/05 1329)   Last BM recorded by nurses in past 5 days No data recorded  Physical Exam Constitutional:      Appearance: She is normal weight.  HENT:     Nose: Nose normal. No congestion.     Mouth/Throat:     Mouth: Mucous membranes are moist.     Pharynx: Oropharynx is clear.  Eyes:     General: Scleral icterus present.     Extraocular Movements: Extraocular movements intact.  Cardiovascular:      Rate and Rhythm: Normal rate and regular rhythm.  Pulmonary:     Effort: Pulmonary effort is normal. No respiratory distress.  Abdominal:     General: Abdomen is flat. Bowel sounds are normal. There is no distension.     Palpations: Abdomen is soft. There is no mass.     Tenderness: There is no abdominal tenderness. There is no guarding or  rebound.     Hernia: No hernia is present.  Musculoskeletal:        General: No swelling. Normal range of motion.     Cervical back: Normal range of motion and neck supple.  Skin:    General: Skin is warm and dry.  Neurological:     General: No focal deficit present.     Mental Status: She is alert and oriented to person, place, and time.  Psychiatric:        Mood and Affect: Mood normal.        Behavior: Behavior normal.        Thought Content: Thought content normal.      LAB RESULTS: Recent Labs    01/07/23 0954  WBC 7.5  HGB 14.0  HCT 40.2  PLT 230   BMET Recent Labs    01/07/23 0954  NA 137  K 3.5  CL 100  CO2 29  GLUCOSE 130*  BUN 9  CREATININE 0.91  CALCIUM 8.7*   LFT Recent Labs    01/07/23 0954  PROT 6.8  ALBUMIN 3.2*  AST 65*  ALT 38  ALKPHOS 175*  BILITOT 14.1*   PT/INR No results for input(s): "LABPROT", "INR" in the last 72 hours.  STUDIES: No results found.    Impression    Hyperbilirubinemia - CT and abdominal MRI showed multiple (at least 3) liver lesions, bulky abdominal and retroperitoneal adenopathy, and multiple small lung nodules up to 5 mm, concerning for metastatic malignancy. gallbladder appears diffusely thickened, with multiple small stones.  No other primary tumor seen on the images. -T bili 14.1 -AST 65/ALT 38/alk phos 175 -No leukocytosis -C. difficile negative -Hepatitis panel pending -CEA 18.9 -AFP 10.2 -Liver biopsy nondiagnostic for malignant cells -EUS with FNA of peripancreatic and portal lns negative Unique clinical picture with elevated bilirubin and multiple biopsies  negative thus far along with absence of symptoms.  No PET scan.  No previous history of colonoscopy. With CEA elevation, no previous colonoscopy, could consider colon primary source of malignancy.  Will need to rule out.  However, elevated bilirubin could be from liver lesion causing obstruction (?) versus other source of infection as hepatitis and HIV panels pending as well as stool studies.    Plan   Plan for Colonoscopy tomorrow. I thoroughly discussed the procedures to include nature, alternatives, benefits, and risks including but not limited to bleeding, perforation, infection, anesthesia/cardiac and pulmonary complications. Patient provides understanding and gave verbal consent to proceed. Moviprep, clear liquid diet, NPO at midnight. If colonoscopy is negative, could consider repeat liver bx versus bx of abdominal lymphadenopathy for further evaluation Continue to trend LFTs CA 19-9  ADDENDUM 3:02PM Colonoscopy scheduled for Friday 6/7 (no schedule availability in endoscopy center tomorrow) Soft diet tonight Clear liquids tomorrow and prep tomorrow.  Thank you for your kind consultation, we will continue to follow.   Bayley Leanna Sato  01/07/2023, 2:15 PM      Attending physician's note   I have taken history, reviewed the chart and examined the patient. I performed a substantive portion of this encounter, including complete performance of at least one of the key components, in conjunction with the APP. I agree with the Advanced Practitioner's note, impression and recommendations.   Worsening jaundice with liver lesions (Bx-nonconclusive), abdominal/retroperitoneal lymphadenopathy (neg EUS bx), lung lesions suspicious for metastatic disease. R/O infiltrative disorders like sarcoidosis.  Wt loss.(16lb over 1 month)  Plan: -Rpt MRCP with contrast today -If significant bil dil, then  ERCP in AM -If no biliary dil, liver Bx in AM -Check CA 19-9, ACE level (Nl Ca) -PET scan (as  outpt) -Would need colon (d/t elevated CEA) likely 6/7 or 6/8. -Stop Augmentin -Fractionate bilirubin -Check PT/INR in a.m. 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

## 2023-01-07 NOTE — Progress Notes (Signed)
The proposed treatment discussed in conference is for discussion purpose only and is not a binding recommendation.  The patients have not been physically examined, or presented with their treatment options.  Therefore, final treatment plans cannot be decided.  

## 2023-01-07 NOTE — Progress Notes (Addendum)
Patient Care Team: [name], NP as PCP - General (Nurse Practitioner) Malachy Mood, MD as Attending Physician (Hematology and Oncology)   CHIEF COMPLAINT: Follow up recent hospitalization and liver mass  CURRENT THERAPY: PENDING further work up  INTERVAL HISTORY Regina Mckay returns for follow up as scheduled. Seen by Dr. Mosetta Putt in consult 12/30/22 and directly admitted for possible biliary obstruction with Tbili 9.0. MRI/MRCP showed multiple hypoenhancing liver lesions, highly concerning for metastatic disease, and unchanged bulky abdominal adenopathy. Cholelithiasis without acute cholecystitis, and no biliary ductal dilatation. Given lack of biliary dilatation, ERCP was not necessary. She underwent EUS in the hospital, path from peripancreatic and portal LNs were both negative.    She presents in a wheelchair with sister and granddaughter. She still feels bad. Energy very low, not up much at home except to bathroom. Finished the remaining 4 days of Augmentin yesterday. No recurrent fever. Having some nausea and burping, no vomiting. Has protonix but not carafate Rx. Having diarrhea after any po from antibiotics. Feels dehydrated.   ROS  All other systems reviewed and negative  Past Medical History:  Diagnosis Date   Diabetes mellitus without complication (HCC)    Hypertension      Past Surgical History:  Procedure Laterality Date   APPLICATION OF CRANIAL NAVIGATION N/A 10/11/2020   Procedure: APPLICATION OF CRANIAL NAVIGATION;  Surgeon: Bedelia Person, MD;  Location: [facility] OR;  Service: Neurosurgery;  Laterality: N/A;   BIOPSY  01/01/2023   Procedure: BIOPSY;  Surgeon: [name]., MD;  Location: [facility];  Service: Gastroenterology;;   Monica Becton N/A 10/11/2020   Procedure: CRANIOTOMY FOR RESECTION OF MENINGIOMA;  Surgeon: Bedelia Person, MD;  Location: [facility] OR;  Service: Neurosurgery;  Laterality: N/A;   ESOPHAGOGASTRODUODENOSCOPY (EGD) WITH PROPOFOL N/A 01/01/2023    Procedure: ESOPHAGOGASTRODUODENOSCOPY (EGD) WITH PROPOFOL;  Surgeon: Meridee Score Netty Starring., MD;  Location: [facility] ENDOSCOPY;  Service: Gastroenterology;  Laterality: N/A;   EUS N/A 01/01/2023   Procedure: UPPER ENDOSCOPIC ULTRASOUND (EUS) LINEAR;  Surgeon: [name]., MD;  Location: [facility] ENDOSCOPY;  Service: Gastroenterology;  Laterality: N/A;   FINE NEEDLE ASPIRATION N/A 01/01/2023   Procedure: FINE NEEDLE ASPIRATION (FNA) LINEAR;  Surgeon: [name]., MD;  Location: [facility] ENDOSCOPY;  Service: Gastroenterology;  Laterality: N/A;   REPLACEMENT TOTAL KNEE     TONSILLECTOMY       Outpatient Encounter Medications as of 01/07/2023  Medication Sig   amLODipine (NORVASC) 10 MG tablet Take 1 tablet (10 mg total) by mouth daily. Follow-up appt due in July must see provider for future refills (Patient taking differently: Take 10 mg by mouth daily.)   amoxicillin-clavulanate (AUGMENTIN) 875-125 MG tablet Take 1 tablet by mouth 2 (two) times daily for 14 days. Continue for 4 more days, then stop   bismuth subsalicylate (PEPTO BISMOL) 262 MG chewable tablet Chew 524 mg by mouth as needed for indigestion or diarrhea or loose stools.   cholecalciferol (VITAMIN D3) 10 MCG (400 UNIT) TABS tablet Take 400 Units by mouth daily.   Continuous Blood Gluc Receiver (FREESTYLE LIBRE 2 READER) DEVI 1 each by Does not apply route daily.   Continuous Blood Gluc Sensor (FREESTYLE LIBRE 2 SENSOR) MISC USE 1 SENSOR EVERY 14 DAYS AS DIRECTED (Patient taking differently: Inject 1 Device into the skin every 14 (fourteen) days.)   hydrochlorothiazide (MICROZIDE) 12.5 MG capsule Take 1 capsule (12.5 mg total) by mouth daily.   insulin aspart (NOVOLOG FLEXPEN) 100 UNIT/ML FlexPen Inject 5-7 units  under skin up to 2x a day (Patient taking differently: Inject 7-9 Units into the skin See admin instructions. Inject 7-9 into the skin twice a day 15 minutes before meals)   Insulin Glargine (BASAGLAR KWIKPEN) 100 UNIT/ML  Inject 22 Units into the skin daily. (Patient taking differently: Inject 22 Units into the skin in the morning.)   Insulin Pen Needle 32G X 4 MM MISC Use 4x a day (Patient taking differently: 1 each by Other route See admin instructions. Use 4x a day)   lisinopril (ZESTRIL) 20 MG tablet Take 1 tablet (20 mg total) by mouth daily. Follow-up appt due in July (Patient taking differently: Take 20 mg by mouth daily.)   ondansetron (ZOFRAN) 4 MG tablet Take 1 tablet (4 mg total) by mouth every 8 (eight) hours as needed for nausea or vomiting.   oxyCODONE (OXY IR/ROXICODONE) 5 MG immediate release tablet Take 0.5-1 tablets (2.5-5 mg total) by mouth every 4 (four) hours as needed for moderate pain.   pantoprazole (PROTONIX) 40 MG tablet Take 1 tablet (40 mg total) by mouth 2 (two) times daily before a meal.   Semaglutide,0.25 or 0.5MG /DOS, (OZEMPIC, 0.25 OR 0.5 MG/DOSE,) 2 MG/3ML SOPN Inject 0.25 mg into the skin once a week.   SYSTANE HYDRATION PF 0.4-0.3 % SOLN Place 1 drop into both eyes 3 (three) times daily as needed (for dryness or irritation).   [DISCONTINUED] sucralfate (CARAFATE) 1 g tablet Take 1 tablet (1 g total) by mouth 4 (four) times daily -  with meals and at bedtime.   sucralfate (CARAFATE) 1 g tablet Take 1 tablet (1 g total) by mouth 4 (four) times daily -  with meals and at bedtime.   No facility-administered encounter medications on file as of 01/07/2023.     Today's Vitals   01/07/23 1053  BP: 138/64  Pulse: 81  Resp: 17  Temp: 98.3 F (36.8 C)  TempSrc: Oral  SpO2: 98%  Weight: 224 lb 9.6 oz (101.9 kg)   Body mass index is 39.79 kg/m.   PHYSICAL EXAM GENERAL:alert, no distress and comfortable SKIN: no rash  EYES: scleral icterus LUNGS: clear with normal breathing effort HEART: regular rate & rhythm, no lower extremity edema ABDOMEN: abdomen soft, non-tender and normal bowel sounds NEURO: alert & oriented x 3 with fluent speech. Generalized weakness  CBC     Component Value Date/Time   WBC 7.5 01/07/2023 0954   WBC 12.6 (H) 01/02/2023 0644   RBC 4.74 01/07/2023 0954   HGB 14.0 01/07/2023 0954   HCT 40.2 01/07/2023 0954   PLT 230 01/07/2023 0954   MCV 84.8 01/07/2023 0954   MCH 29.5 01/07/2023 0954   MCHC 34.8 01/07/2023 0954   RDW 15.9 (H) 01/07/2023 0954   LYMPHSABS 0.9 01/07/2023 0954   MONOABS 1.0 01/07/2023 0954   EOSABS 0.0 01/07/2023 0954   BASOSABS 0.0 01/07/2023 0954     CMP     Component Value Date/Time   NA 137 01/07/2023 0954   K 3.5 01/07/2023 0954   CL 100 01/07/2023 0954   CO2 29 01/07/2023 0954   GLUCOSE 130 (H) 01/07/2023 0954   BUN 9 01/07/2023 0954   CREATININE 0.91 01/07/2023 0954   CALCIUM 8.7 (L) 01/07/2023 0954   PROT 6.8 01/07/2023 0954   ALBUMIN 3.2 (L) 01/07/2023 0954   AST 65 (H) 01/07/2023 0954   ALT 38 01/07/2023 0954   ALKPHOS 175 (H) 01/07/2023 0954   BILITOT 14.1 (HH) 01/07/2023 0954   GFRNONAA >60  01/07/2023 0954   GFRAA >60 05/17/2019 0548     ASSESSMENT & PLAN:Regina Mckay is a 72 y.o.  female with a history of    1.  Multiple liver masses, abdominal adenopathy, and lung nodules concerning for malignancy  -Patient presented with fever, nausea vomiting to hospital on 12/20/22, was found to have Klebsiella bacteremia, CT and abdominal MRI showed multiple (at least 3) liver lesions, bulky abdominal and retroperitoneal adenopathy, and multiple small lung nodules up to 5 mm, concerning for metastatic malignancy.   -gallbladder appears diffusely thickened, with multiple small stones.  No other primary tumor seen on the images. -liver biopsy was non-diagnostic for malignant cells.  We discussed that this could be false negative. -Admitted from clinic 12/30/22 for hyperbilirubinemia 9, EUS with FNA of peripancreatic and portal LNs again negative  -Ms. Groot appears ill and dehydrated with low PS.  Completed Augmentin 01/06/2023, no recurrent fever.  She has nausea and diarrhea possibly from  underlying illness vs antibiotics.  Will repeat C. difficile and GI pathogens.   -We discussed her case in multidisciplinary conference this morning, the recommendation was to monitor bili and repeat CT in 3-4 weeks with possible repeat liver biopsy but unfortunately bili continues to rise now up to 14. -Pt seen with Dr. Mosetta Putt who recommends hospital re-admission for expedited work up.      PLAN: -Today's labs and recent LN biopsy reviewed, negative for malignancy -Hep panel, HIV, C.dif and GI pathogen collected in clinic  -Pt seen with Dr. Mosetta Putt: Direct admit for worsening hyperbilirubinemia Tbili 14 -Recommend repeat imaging, consider ID consult in the event liver lesions are infectious from recent klebsiella bacteremia, and consider repeat liver bx     Orders Placed This Encounter  Procedures   C difficile quick screen w PCR reflex    Standing Status:   Future    Number of Occurrences:   1    Standing Expiration Date:   01/07/2024   GI pathogen panel by PCR, stool    Standing Status:   Future    Number of Occurrences:   1    Standing Expiration Date:   01/07/2024   HIV antibody (with reflex)    Standing Status:   Future    Standing Expiration Date:   01/07/2024   Hepatitis A antibody, total    Standing Status:   Future    Standing Expiration Date:   01/07/2024   Hepatitis B core antibody, total    Standing Status:   Future    Standing Expiration Date:   01/07/2024   Hepatitis C antibody    Standing Status:   Future    Standing Expiration Date:   01/07/2024      All questions were answered. The patient knows to call the clinic with any problems, questions or concerns. No barriers to learning were detected.   Regina Glad, NP-C 01/07/2023  Addendum I have seen the patient, examined her. I agree with the assessment and and plan and have edited the notes.   I reviewed her EUS lymph node biopsy results with patient and her family, it was negative for malignant cells.  She had persistent  fatigue, low appetite, diarrhea and nausea.  Lab reviewed worsening hyperbilirubinemia with total bilirubin 14 today.  Her case was discussed in GI tumor conference this morning with GI Dr. Meridee Score, and IR Dr. Grace Isaac.  Due to the worsening hyperbilirubinemia and persistent symptoms, I recommend hospital admission to expedite her workup.  I have talked  to hospitalist team, and reach out to Dr. Meridee Score and his inpt team and Dr. Grace Isaac.  They will follow-up her in hospital. Will repeat MRCP first after admission. I will f/u in hospital. I spent a total of 40 mins for her care today including care coordination.   Malachy Mood MD 01/07/2023

## 2023-01-08 DIAGNOSIS — E1139 Type 2 diabetes mellitus with other diabetic ophthalmic complication: Secondary | ICD-10-CM | POA: Insufficient documentation

## 2023-01-08 DIAGNOSIS — R160 Hepatomegaly, not elsewhere classified: Secondary | ICD-10-CM | POA: Insufficient documentation

## 2023-01-08 LAB — HEPATITIS C ANTIBODY: HCV Ab: REACTIVE — AB

## 2023-01-08 LAB — GLUCOSE, CAPILLARY
Glucose-Capillary: 125 mg/dL — ABNORMAL HIGH (ref 70–99)
Glucose-Capillary: 133 mg/dL — ABNORMAL HIGH (ref 70–99)
Glucose-Capillary: 114 mg/dL — ABNORMAL HIGH (ref 70–99)
Glucose-Capillary: 137 mg/dL — ABNORMAL HIGH (ref 70–99)

## 2023-01-08 LAB — COMPREHENSIVE METABOLIC PANEL
ALT: 41 U/L (ref 0–44)
AST: 57 U/L — ABNORMAL HIGH (ref 15–41)
Albumin: 2.3 g/dL — ABNORMAL LOW (ref 3.5–5.0)
Alkaline Phosphatase: 138 U/L — ABNORMAL HIGH (ref 38–126)
Anion gap: 10 (ref 5–15)
BUN: 11 mg/dL (ref 8–23)
CO2: 26 mmol/L (ref 22–32)
Calcium: 8 mg/dL — ABNORMAL LOW (ref 8.9–10.3)
Chloride: 96 mmol/L — ABNORMAL LOW (ref 98–111)
Creatinine, Ser: 0.74 mg/dL (ref 0.44–1.00)
GFR, Estimated: >60 mL/min (ref >60)
Glucose, Bld: 149 mg/dL — ABNORMAL HIGH (ref 70–99)
Potassium: 3.3 mmol/L — ABNORMAL LOW (ref 3.5–5.1)
Sodium: 132 mmol/L — ABNORMAL LOW (ref 135–145)
Total Bilirubin: 13.5 mg/dL — ABNORMAL HIGH (ref 0.3–1.2)
Total Protein: 6.2 g/dL — ABNORMAL LOW (ref 6.5–8.1)

## 2023-01-08 LAB — CBC
HCT: 36.8 % (ref 36.0–46.0)
Hemoglobin: 12.5 g/dL (ref 12.0–15.0)
MCH: 29.6 pg (ref 26.0–34.0)
MCHC: 34 g/dL (ref 30.0–36.0)
MCV: 87 fL (ref 80.0–100.0)
Platelets: 209 10*3/uL (ref 150–400)
RBC: 4.23 MIL/uL (ref 3.87–5.11)
RDW: 16.6 % — ABNORMAL HIGH (ref 11.5–15.5)
WBC: 6.6 10*3/uL (ref 4.0–10.5)
nRBC: 0 % (ref 0.0–0.2)

## 2023-01-08 LAB — GASTROINTESTINAL PANEL BY PCR, STOOL (REPLACES STOOL CULTURE)

## 2023-01-08 LAB — PROTIME-INR
INR: 1.3 — ABNORMAL HIGH (ref 0.8–1.2)
Prothrombin Time: 16.6 seconds — ABNORMAL HIGH (ref 11.4–15.2)

## 2023-01-08 MED ORDER — ENSURE MAX PROTEIN PO LIQD
11.0000 [oz_av] | Freq: Two times a day (BID) | ORAL | Status: DC
  Administered 2023-01-08 – 2023-01-17 (×6): 11 [oz_av] via ORAL
  Filled 2023-01-08 (×19): qty 330

## 2023-01-08 MED ORDER — POTASSIUM CHLORIDE CRYS ER 20 MEQ PO TBCR
40.0000 meq | EXTENDED_RELEASE_TABLET | ORAL | Status: AC
  Administered 2023-01-08 (×2): 40 meq via ORAL
  Filled 2023-01-08 (×2): qty 2

## 2023-01-08 NOTE — Evaluation (Signed)
Physical Therapy Evaluation Patient Details Name: GERTHA HUTMACHER MRN: [id number] DOB: Jan 26, 1951 Today's Date: 01/08/2023  History of Present Illness  Pt is a 72 y/o F admitted on 01/07/23 after presenting with continued weakness & elevated bilirubin. Pt has been admitted for further workup. PMH: obesity, HTN, IDDM2  Clinical Impression  Pt seen for PT evaluation with pt agreeable with encouragement. Pt reports prior to admission she was independent without AD, driving, living alone, denies falls. On this date, pt is independent with bed mobility & transfers, ambulates without AD with slow gait speed but no overt LOB. At this time, pt appears to be at baseline in regards to functional mobility & does not require acute PT services. PT to complete current orders at this time, please re-consult if new needs arise.       Recommendations for follow up therapy are one component of a multi-disciplinary discharge planning process, led by the attending physician.  Recommendations may be updated based on patient status, additional functional criteria and insurance authorization.  Follow Up Recommendations       Assistance Recommended at Discharge PRN  Patient can return home with the following  Assistance with cooking/housework    Equipment Recommendations None recommended by PT  Recommendations for Other Services       Functional Status Assessment Patient has not had a recent decline in their functional status     Precautions / Restrictions Precautions Precautions: None Restrictions Weight Bearing Restrictions: No      Mobility  Bed Mobility Overal bed mobility: Independent             General bed mobility comments: supine>sit    Transfers Overall transfer level: Independent Equipment used: None               General transfer comment: STS from EOB    Ambulation/Gait Ambulation/Gait assistance: Modified independent (Device/Increase time) Gait Distance (Feet): 150  Feet Assistive device: None Gait Pattern/deviations: Decreased step length - right, Decreased step length - left, Decreased stride length Gait velocity: decreased     General Gait Details: Attempted to engage in pt in speed changes & head turns to challenge balance during gait but pt not agreeable.  Stairs            Wheelchair Mobility    Modified Rankin (Stroke Patients Only)       Balance     Sitting balance-Leahy Scale: Good     Standing balance support: No upper extremity supported, During functional activity Standing balance-Leahy Scale: Good                               Pertinent Vitals/Pain Pain Assessment Pain Assessment: No/denies pain    Home Living Family/patient expects to be discharged to:: Private residence Living Arrangements: Alone Available Help at Discharge: Family;Available PRN/intermittently Type of Home: Apartment Home Access: Level entry       Home Layout: One level Home Equipment: Cane - single point      Prior Function Prior Level of Function : Driving;Independent/Modified Independent             Mobility Comments: Pt reports she's independent without AD, denies falls, driving. ADLs Comments: Completes all ADLs and IADLs without assistance.     Hand Dominance   Dominant Hand: Right    Extremity/Trunk Assessment   Upper Extremity Assessment Upper Extremity Assessment: Overall WFL for tasks assessed    Lower Extremity Assessment Lower Extremity  Assessment: Overall WFL for tasks assessed;Generalized weakness       Communication   Communication: No difficulties  Cognition Arousal/Alertness: Awake/alert Behavior During Therapy: Flat affect Overall Cognitive Status: Within Functional Limits for tasks assessed                                 General Comments: Requires encouragement for participation & relunctantly agreeable.        General Comments      Exercises      Assessment/Plan    PT Assessment Patient does not need any further PT services  PT Problem List         PT Treatment Interventions      PT Goals (Current goals can be found in the Care Plan section)  Acute Rehab PT Goals Patient Stated Goal: figure out what's going on PT Goal Formulation: With patient Time For Goal Achievement: 01/22/23 Potential to Achieve Goals: Good    Frequency       Co-evaluation               AM-PAC PT "6 Clicks" Mobility  Outcome Measure Help needed turning from your back to your side while in a flat bed without using bedrails?: None Help needed moving from lying on your back to sitting on the side of a flat bed without using bedrails?: None Help needed moving to and from a bed to a chair (including a wheelchair)?: None Help needed standing up from a chair using your arms (e.g., wheelchair or bedside chair)?: None Help needed to walk in hospital room?: None Help needed climbing 3-5 steps with a railing? : None 6 Click Score: 24    End of Session   Activity Tolerance: Patient tolerated treatment well Patient left: in chair;with call bell/phone within reach;with family/visitor present        Time: 1022-1038 PT Time Calculation (min) (ACUTE ONLY): 16 min   Charges:   PT Evaluation $PT Eval Low Complexity: 1 Low          Aleda Grana, PT, DPT 01/08/23, 10:45 AM   Sandi Mariscal 01/08/2023, 10:43 AM

## 2023-01-08 NOTE — Progress Notes (Signed)
OT Cancellation Note  Patient Details Name: Regina Mckay MRN: [id number] DOB: [birth year]   Cancelled Treatment:    Reason Eval/Treat Not Completed: OT screened, no needs identified, will sign off: Pt visited from [phone] for intake questions. At the end of the Q&A pt requested no OT as she has no need. Pt's daughter present and supportive of this decision. Pt was encouraged to work with Physical Therapy to help with discharge planning. Pt did not respond to that but repeated that she wants no OT. OT signing off.    Theodoro Clock 01/08/2023, 9:38 AM

## 2023-01-08 NOTE — Progress Notes (Signed)
[facility] Progress Note  Patient: Regina Mckay     MVH:[id number]  DOA: 01/07/2023   PCP: [name], NP       Brief hospital course: This is a 72 year old female with a history of meningioma, hypertension, diabetes mellitus, severe esophagitis & erosive gastritis, recent Klebsiella bacteremia in May.  Recent imaging reveals liver lesions suspicious for mets, retroperitoneal lymphadenopathy, pulmonary nodules, supraclavicular and mediastinal lymphadenopathy and thyroid nodules.  Biopsies of liver, gastric mucosa and abdominal lymph node have all been inconclusive.  No CBD dilatation noted on imaging and EUS but T bili rising. She was seen by oncology on 6/5 and noted to have an increasing bilirubin level. She has been admitted for hyperbilirubinemia, nausea, diarrhea and severe weakness.  Subjective:  Was having severe indigestion which has improved after starting sucralfate.  Tolerating regular diet so far. Just had breakfast about an hour earlier.  No other complaints.  Assessment and Plan: Principal Problem:   Hyperbilirubinemia- Liver masses -Hyperbilirubinemia likely secondary to liver masses which appear to be metastasis -EUS was performed on 5/30 and fine-needle aspiration of lymph nodes was negative for malignancy - Patient also has bulky abdominal adenopathy, lung nodules and thyroid nodules -CEA 18.9 - Etiology for above is uncertain and therefore she will undergo a colonoscopy tomorrow to look for colon cancer   Active Problems: History of hepatitis - Most likely hepatitis C as hep C antibodies noted to be positive - the patient states it her hepatitis was previously treated -AFP 10.2 with the upper limit of normal being 9.2-most likely not primary liver cancer  Esophagitis and erosive gastritis - Continue PPI - Although the patient was discharged with a prescription for sucralfate, she states that she never received it from the pharmacy - Sucralfate  started in the hospital-she states this has helped her significantly  History of recent bacteremia due to Klebsiella pneumoniae - Finished treatment with Augmentin -completed a total of 14 days of antibiotics    Class 3 severe obesity with serious comorbidity and body mass index (BMI) of 40.0 to 44.9 in adult (HCC) Body mass index is 39.44 kg/m.    DM (diabetes mellitus), type 2 with ophthalmic complications (HCC) Carb modified diet, Semglee and NovoLog sliding scale ordered  Hypertension Continue amlodipine and lisinopril     Code Status: Full Code Consultants: GI Level of Care: Level of care: Med-Surg Total time on patient care: 40 minutes DVT prophylaxis:  SCDs Start: 01/07/23 1354     Objective:   Vitals:   01/07/23 2246 01/08/23 0156 01/08/23 1100 01/08/23 1329  BP: 125/64 (!) 145/68 139/70 121/72  Pulse: 87 79 95 73  Resp: 16 14    Temp: 99 F (37.2 C) 98.6 F (37 C) 98.7 F (37.1 C) 97.8 F (36.6 C)  TempSrc: Oral Oral Oral Oral  SpO2: 96% 96%  97%  Weight:      Height:       Filed Weights   01/07/23 1329  Weight: 101 kg   Exam: General exam: Appears comfortable  HEENT: oral mucosa moist Respiratory system: Clear to auscultation.  Cardiovascular system: S1 & S2 heard  Gastrointestinal system: Abdomen soft, non-tender, nondistended. Normal bowel sounds   Extremities: No cyanosis, clubbing or edema Psychiatry:  Mood & affect appropriate.      CBC: Recent Labs  Lab 01/02/23 0644 01/07/23 0954 01/08/23 0554  WBC 12.6* 7.5 6.6  NEUTROABS 10.6* 5.5  --   HGB 13.3 14.0 12.5  HCT 39.5 40.2  36.8  MCV 88.2 84.8 87.0  PLT 255 230 209   Basic Metabolic Panel: Recent Labs  Lab 01/02/23 0644 01/07/23 0954 01/08/23 0554  NA 137 137 132*  K 3.4* 3.5 3.3*  CL 102 100 96*  CO2 25 29 26   GLUCOSE 109* 130* 149*  BUN 10 9 11   CREATININE 0.70 0.91 0.74  CALCIUM 8.2* 8.7* 8.0*  MG 1.7  --   --    GFR: Estimated Creatinine Clearance: 71 mL/min (by  C-G formula based on SCr of 0.74 mg/dL).  Scheduled Meds:  acidophilus  1 capsule Oral Daily   amLODipine  10 mg Oral Daily   enoxaparin (LOVENOX) injection  50 mg Subcutaneous Q24H   hydrochlorothiazide  12.5 mg Oral Daily   insulin aspart  0-15 Units Subcutaneous TID WC   insulin aspart  0-5 Units Subcutaneous QHS   insulin glargine-yfgn  10 Units Subcutaneous Daily   lisinopril  20 mg Oral Daily   pantoprazole  40 mg Oral BID AC   peg 3350 powder  0.5 kit Oral Once   And   [START ON 01/09/2023] peg 3350 powder  0.5 kit Oral Once   potassium chloride  40 mEq Oral Q4H   Ensure Max Protein  11 oz Oral BID   sucralfate  1 g Oral TID WC & HS   Continuous Infusions: Imaging and lab data was personally reviewed MR ABDOMEN MRCP W WO CONTAST  Result Date: 01/08/2023 CLINICAL DATA:  Jaundice.  Liver metastasis. EXAM: MRI ABDOMEN WITHOUT AND WITH CONTRAST (INCLUDING MRCP) TECHNIQUE: Multiplanar multisequence MR imaging of the abdomen was performed both before and after the administration of intravenous contrast. Heavily T2-weighted images of the biliary and pancreatic ducts were obtained, and three-dimensional MRCP images were rendered by post processing. CONTRAST:  10mL GADAVIST GADOBUTROL 1 MMOL/ML IV SOLN COMPARISON:  12/31/2022 FINDINGS: Lower chest: Trace right pleural fluid. Hepatobiliary: As noted on recent MRIs there are multifocal hypoenhancing liver lesions suspicious for metastatic disease. Index lesion within the lateral dome measures 2.6 x 1.7 cm, image 16/18. This is unchanged from 12/22/2022. Index lesion within the posteroinferior right lobe of liver measures 1.8 x 1.3 cm, image 44/18. Previously 1.6 x 1.2 cm. Also in the posterior right lobe of liver there is a 1.4 by 0.9 cm lesion. Previously 1.1 x 0.9 cm. Small gallstones. Mild gallbladder wall thickening measures up to 3 mm. Trace pericholecystic fluid. Mild intrahepatic bile duct dilatation. Unchanged from 12/22/2022. On the MRCP mid  images the there is abrupt termination of the converging intrahepatic bile loops centrally suggesting underlying central obstructing lesion, image 4/12. The common bile duct appears nondilated. Pancreas: No mass, inflammatory changes, or other parenchymal abnormality identified. Spleen:  Within normal limits in size and appearance. Adrenals/Urinary Tract: Normal adrenal glands. Small left kidney cysts are identified which measure up to 8 mm. No follow-up imaging recommended. Stomach/Bowel: Visualized portions within the abdomen are unremarkable. Vascular/Lymphatic: Normal appearance of the abdominal aorta. Patent upper abdominal vascularity. 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 Enlarged lymph nodes within the hepatic duodenal ligament and retroperitoneum are again noted. Other: Mild perihepatic ascites and perisplenic ascites. Similar to 12/31/22 Musculoskeletal: No suspicious bone lesions identified. IMPRESSION: 1. Again noted are multifocal hypoenhancing liver lesions suspicious for metastatic disease. The dominant lesion within the lateral dome of liver is unchanged from previous exam. 2. Mild intrahepatic bile duct dilatation with abrupt termination of the converging intrahepatic bile loops centrally  suggesting underlying central obstructing lesion versus mass effect from enlarged portahepatic lymph nodes. The common bile duct appears nondilated. Consider further evaluation with ERCP. 3. Gallstones with mild gallbladder wall thickening. 4. Enlarged lymph nodes within the hepatic duodenal ligament and retroperitoneum are again noted. Concerning for nodal metastasis. 5. Mild perihepatic ascites and perisplenic ascites. Electronically Signed   By: Signa Kell M.D.   On: 01/08/2023 06:44   MR 3D Recon At Scanner  Result Date: 01/08/2023 CLINICAL DATA:  Jaundice.  Liver metastasis. EXAM: MRI ABDOMEN WITHOUT AND WITH CONTRAST (INCLUDING MRCP) TECHNIQUE:  Multiplanar multisequence MR imaging of the abdomen was performed both before and after the administration of intravenous contrast. Heavily T2-weighted images of the biliary and pancreatic ducts were obtained, and three-dimensional MRCP images were rendered by post processing. CONTRAST:  10mL GADAVIST GADOBUTROL 1 MMOL/ML IV SOLN COMPARISON:  12/31/2022 FINDINGS: Lower chest: Trace right pleural fluid. Hepatobiliary: As noted on recent MRIs there are multifocal hypoenhancing liver lesions suspicious for metastatic disease. Index lesion within the lateral dome measures 2.6 x 1.7 cm, image 16/18. This is unchanged from 12/22/2022. Index lesion within the posteroinferior right lobe of liver measures 1.8 x 1.3 cm, image 44/18. Previously 1.6 x 1.2 cm. Also in the posterior right lobe of liver there is a 1.4 by 0.9 cm lesion. Previously 1.1 x 0.9 cm. Small gallstones. Mild gallbladder wall thickening measures up to 3 mm. Trace pericholecystic fluid. Mild intrahepatic bile duct dilatation. Unchanged from 12/22/2022. On the MRCP mid images the there is abrupt termination of the converging intrahepatic bile loops centrally suggesting underlying central obstructing lesion, image 4/12. The common bile duct appears nondilated. Pancreas: No mass, inflammatory changes, or other parenchymal abnormality identified. Spleen:  Within normal limits in size and appearance. Adrenals/Urinary Tract: Normal adrenal glands. Small left kidney cysts are identified which measure up to 8 mm. No follow-up imaging recommended. Stomach/Bowel: Visualized portions within the abdomen are unremarkable. Vascular/Lymphatic: Normal appearance of the abdominal aorta. Patent upper abdominal vascularity. 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 Enlarged lymph nodes within the hepatic duodenal ligament and retroperitoneum are again noted. Other: Mild perihepatic ascites and perisplenic ascites. Similar to  12/31/22 Musculoskeletal: No suspicious bone lesions identified. IMPRESSION: 1. Again noted are multifocal hypoenhancing liver lesions suspicious for metastatic disease. The dominant lesion within the lateral dome of liver is unchanged from previous exam. 2. Mild intrahepatic bile duct dilatation with abrupt termination of the converging intrahepatic bile loops centrally suggesting underlying central obstructing lesion versus mass effect from enlarged portahepatic lymph nodes. The common bile duct appears nondilated. Consider further evaluation with ERCP. 3. Gallstones with mild gallbladder wall thickening. 4. Enlarged lymph nodes within the hepatic duodenal ligament and retroperitoneum are again noted. Concerning for nodal metastasis. 5. Mild perihepatic ascites and perisplenic ascites. Electronically Signed   By: Signa Kell M.D.   On: 01/08/2023 06:44    LOS: 1 day   Author: Calvert Cantor  01/08/2023 1:53 PM  To contact [facility]>   Check the care team in [facility] and look for the attending/consulting [facility] provider listed  Log into [email or web address] and use [facility] universal password   Go to> "[facility]"  and find provider  If you still have difficulty reaching the provider, please page the [facility] (Director on Call) for the Hospitalists listed on amion

## 2023-01-08 NOTE — Progress Notes (Addendum)
Progress Note  Primary GI: Unassigned (Seen by Dr. Tomasa Rand inpatient - [facility])   LOS: 1 day   Chief Complaint: Hyperbilirubinemia   Subjective    Patient with family at bedside, sister. Provided some of the history.   Patient denies abdominal pain, nausea, vomiting.  Patient wants to eat.  No complaints overall.   Objective   Vital signs in last 24 hours: Temp:  [98.3 F (36.8 C)-99 F (37.2 C)] 98.6 F (37 C) (06/06 0156) Pulse Rate:  [78-87] 79 (06/06 0156) Resp:  [14-17] 14 (06/06 0156) BP: (125-145)/(64-68) 145/68 (06/06 0156) SpO2:  [96 %-98 %] 96 % (06/06 0156) Weight:  [101 kg-101.9 kg] 101 kg (06/05 1329) Last BM Date : 01/21/23 Last BM recorded by nurses in past 5 days Stool Type: Type 6 (Mushy consistency with ragged edges); Type 7 (Liquid consistency with no solid pieces) (01/08/2023  5:52 AM)  General:   female in no acute distress, icteric sclera Heart:  Regular rate and rhythm; no murmurs Pulm: Clear anteriorly; no wheezing Abdomen: soft, nondistended, normal bowel sounds in all quadrants. Nontender without guarding. No organomegaly appreciated. Extremities:  No edema Neurologic:  Alert and  oriented x4;  No focal deficits.  Psych:  Cooperative. Normal mood and affect.  Intake/Output from previous day: No intake/output data recorded. Intake/Output this shift: No intake/output data recorded.  Studies/Results: MR ABDOMEN MRCP W WO CONTAST  Result Date: 01/08/2023 CLINICAL DATA:  Jaundice.  Liver metastasis. EXAM: MRI ABDOMEN WITHOUT AND WITH CONTRAST (INCLUDING MRCP) TECHNIQUE: Multiplanar multisequence MR imaging of the abdomen was performed both before and after the administration of intravenous contrast. Heavily T2-weighted images of the biliary and pancreatic ducts were obtained, and three-dimensional MRCP images were rendered by post processing. CONTRAST:  10mL GADAVIST GADOBUTROL 1 MMOL/ML IV SOLN COMPARISON:  12/31/2022 FINDINGS: Lower chest: Trace  right pleural fluid. Hepatobiliary: As noted on recent MRIs there are multifocal hypoenhancing liver lesions suspicious for metastatic disease. Index lesion within the lateral dome measures 2.6 x 1.7 cm, image 16/18. This is unchanged from 12/22/2022. Index lesion within the posteroinferior right lobe of liver measures 1.8 x 1.3 cm, image 44/18. Previously 1.6 x 1.2 cm. Also in the posterior right lobe of liver there is a 1.4 by 0.9 cm lesion. Previously 1.1 x 0.9 cm. Small gallstones. Mild gallbladder wall thickening measures up to 3 mm. Trace pericholecystic fluid. Mild intrahepatic bile duct dilatation. Unchanged from 12/22/2022. On the MRCP mid images the there is abrupt termination of the converging intrahepatic bile loops centrally suggesting underlying central obstructing lesion, image 4/12. The common bile duct appears nondilated. Pancreas: No mass, inflammatory changes, or other parenchymal abnormality identified. Spleen:  Within normal limits in size and appearance. Adrenals/Urinary Tract: Normal adrenal glands. Small left kidney cysts are identified which measure up to 8 mm. No follow-up imaging recommended. Stomach/Bowel: Visualized portions within the abdomen are unremarkable. Vascular/Lymphatic: Normal appearance of the abdominal aorta. Patent upper abdominal vascularity. 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 Enlarged lymph nodes within the hepatic duodenal ligament and retroperitoneum are again noted. Other: Mild perihepatic ascites and perisplenic ascites. Similar to 12/31/22 Musculoskeletal: No suspicious bone lesions identified. IMPRESSION: 1. Again noted are multifocal hypoenhancing liver lesions suspicious for metastatic disease. The dominant lesion within the lateral dome of liver is unchanged from previous exam. 2. Mild intrahepatic bile duct dilatation with abrupt termination of the converging intrahepatic bile loops centrally suggesting  underlying central obstructing  lesion versus mass effect from enlarged portahepatic lymph nodes. The common bile duct appears nondilated. Consider further evaluation with ERCP. 3. Gallstones with mild gallbladder wall thickening. 4. Enlarged lymph nodes within the hepatic duodenal ligament and retroperitoneum are again noted. Concerning for nodal metastasis. 5. Mild perihepatic ascites and perisplenic ascites. Electronically Signed   By: Signa Kell M.D.   On: 01/08/2023 06:44   MR 3D Recon At Scanner  Result Date: 01/08/2023 CLINICAL DATA:  Jaundice.  Liver metastasis. EXAM: MRI ABDOMEN WITHOUT AND WITH CONTRAST (INCLUDING MRCP) TECHNIQUE: Multiplanar multisequence MR imaging of the abdomen was performed both before and after the administration of intravenous contrast. Heavily T2-weighted images of the biliary and pancreatic ducts were obtained, and three-dimensional MRCP images were rendered by post processing. CONTRAST:  10mL GADAVIST GADOBUTROL 1 MMOL/ML IV SOLN COMPARISON:  12/31/2022 FINDINGS: Lower chest: Trace right pleural fluid. Hepatobiliary: As noted on recent MRIs there are multifocal hypoenhancing liver lesions suspicious for metastatic disease. Index lesion within the lateral dome measures 2.6 x 1.7 cm, image 16/18. This is unchanged from 12/22/2022. Index lesion within the posteroinferior right lobe of liver measures 1.8 x 1.3 cm, image 44/18. Previously 1.6 x 1.2 cm. Also in the posterior right lobe of liver there is a 1.4 by 0.9 cm lesion. Previously 1.1 x 0.9 cm. Small gallstones. Mild gallbladder wall thickening measures up to 3 mm. Trace pericholecystic fluid. Mild intrahepatic bile duct dilatation. Unchanged from 12/22/2022. On the MRCP mid images the there is abrupt termination of the converging intrahepatic bile loops centrally suggesting underlying central obstructing lesion, image 4/12. The common bile duct appears nondilated. Pancreas: No mass, inflammatory changes, or other  parenchymal abnormality identified. Spleen:  Within normal limits in size and appearance. Adrenals/Urinary Tract: Normal adrenal glands. Small left kidney cysts are identified which measure up to 8 mm. No follow-up imaging recommended. Stomach/Bowel: Visualized portions within the abdomen are unremarkable. Vascular/Lymphatic: Normal appearance of the abdominal aorta. Patent upper abdominal vascularity. 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 Enlarged lymph nodes within the hepatic duodenal ligament and retroperitoneum are again noted. Other: Mild perihepatic ascites and perisplenic ascites. Similar to 12/31/22 Musculoskeletal: No suspicious bone lesions identified. IMPRESSION: 1. Again noted are multifocal hypoenhancing liver lesions suspicious for metastatic disease. The dominant lesion within the lateral dome of liver is unchanged from previous exam. 2. Mild intrahepatic bile duct dilatation with abrupt termination of the converging intrahepatic bile loops centrally suggesting underlying central obstructing lesion versus mass effect from enlarged portahepatic lymph nodes. The common bile duct appears nondilated. Consider further evaluation with ERCP. 3. Gallstones with mild gallbladder wall thickening. 4. Enlarged lymph nodes within the hepatic duodenal ligament and retroperitoneum are again noted. Concerning for nodal metastasis. 5. Mild perihepatic ascites and perisplenic ascites. Electronically Signed   By: Signa Kell M.D.   On: 01/08/2023 06:44    Lab Results: Recent Labs    01/07/23 0954 01/08/23 0554  WBC 7.5 6.6  HGB 14.0 12.5  HCT 40.2 36.8  PLT 230 209   BMET Recent Labs    01/07/23 0954 01/08/23 0554  NA 137 132*  K 3.5 3.3*  CL 100 96*  CO2 29 26  GLUCOSE 130* 149*  BUN 9 11  CREATININE 0.91 0.74  CALCIUM 8.7* 8.0*   LFT Recent Labs    01/07/23 1635 01/08/23 0554  PROT 6.6 6.2*  ALBUMIN 2.7* 2.3*  AST 74* 57*  ALT  42 41  ALKPHOS 148* 138*  BILITOT 15.3* 13.5*  BILIDIR 8.0*  --   IBILI NOT CALCULATED  --    PT/INR Recent Labs    01/08/23 0554  LABPROT 16.6*  INR 1.3*     Scheduled Meds:  acidophilus  1 capsule Oral Daily   amLODipine  10 mg Oral Daily   enoxaparin (LOVENOX) injection  50 mg Subcutaneous Q24H   hydrochlorothiazide  12.5 mg Oral Daily   insulin aspart  0-15 Units Subcutaneous TID WC   insulin aspart  0-5 Units Subcutaneous QHS   insulin glargine-yfgn  10 Units Subcutaneous Daily   lisinopril  20 mg Oral Daily   pantoprazole  40 mg Oral BID AC   peg 3350 powder  0.5 kit Oral Once   And   [START ON 01/09/2023] peg 3350 powder  0.5 kit Oral Once   sucralfate  1 g Oral TID WC & HS   Continuous Infusions:    Patient profile:   72 year old female with 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   Impression:   Hyperbilirubinemia Imaging showing malignant metastasis (unknown primary) - CT and abdominal MRI showed multiple (at least 3) liver lesions, bulky abdominal and retroperitoneal adenopathy, and multiple small lung nodules up to 5 mm, concerning for metastatic malignancy. gallbladder appears diffusely thickened, with multiple small stones.  No other primary tumor seen on the images. -T bili 13.5 today, improved -Direct bilirubin 8.0 yesterday -AST 57/ALT 41/alk phos 138, improved -No leukocytosis -C. 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 -CEA 18.9 -AFP 10.2 - PT 16.6., INR 1.3 - ACE pending -Liver biopsy nondiagnostic for malignant cells -EUS with FNA of peripancreatic and portal lns negative - REPEAT MRCP 6/5: Liver mets.  Mild intrahepatic bile duct dilation with termination of converging intrahepatic bile loops suggesting underlying central obstructing lesion versus mass effect from enlarged porta hepatic  lymph nodes.  Gallstones with gallbladder wall thickening.  Enlarged lymph nodes in hepatic duodenal ligament and retroperitoneum concerning for nodal metastasis Bilirubin somewhat improving.  Repeat MRCP shows mild intrahepatic bile duct dilation.    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  -I thoroughly discussed procedure with the patient to include nature, alternatives, benefits, and risks (including but not limited to post ERCP pancreatitis, bleeding, infection, perforation, anesthesia/cardiac pulmonary complications). Discussed risk of pancreatitis associated with ERCP. Patient verbalized understanding and gave verbal consent to proceed with ERCP. - Cancel colonoscopy tomorrow. Pending workup, may still need colonoscopy at some point during admission or in the future.   Bayley Leanna Sato  01/08/2023, 9:36 AM    Attending physician's note   I have taken history, reviewed the chart and examined the patient. I performed a substantive portion of this encounter, including complete performance of at least one of the key components, in conjunction with the APP. I agree with the Advanced Practitioner's note, impression and recommendations.   MRI/MRCP showing -Mild IHBR dil with abrupt cut off at hilum s/o central obstructing lesion (?LN).  Recommended ERCP -Multifocal liver lesions suspicious for metastatic disease -Cholelithiasis -Enlarged mesenteric/retroperitoneal lymph nodes c/w likely nodal metastases  TB 15 today with alk phos 148, D. Bili 8. No itching  Plan: -ERCP w/t brushings/stent 6/7 (endo/anesthesia could not accommodate [place] today). -Would need IR liver Bx later +/- PTC/PTBD -Follow CA 19-9 -Hold off on colon. -Trend CBC, CMP  I have discussed procedure, risks and benefits  with the pt and Pam (pt's sister) including risks of pancreatitis, bleeding, perforation, failed procedure since it would be technically challenging due to intrahepatic nature of lesions.  Benefits were also discussed.  They wish to proceed.  I have also discussed with Dr. Meridee Score and Dr Cunningham(pt's primary GI)   Edman Circle, MD [facility] [phone]

## 2023-01-08 NOTE — H&P (View-Only) (Signed)
  Progress Note  Primary GI: Unassigned (Seen by Dr. Cunningham inpatient - [facility])   LOS: 1 day   Chief Complaint: Hyperbilirubinemia   Subjective    Patient with family at bedside, sister. Provided some of the history.   Patient denies abdominal pain, nausea, vomiting.  Patient wants to eat.  No complaints overall.   Objective   Vital signs in last 24 hours: Temp:  [98.3 F (36.8 C)-99 F (37.2 C)] 98.6 F (37 C) (06/06 0156) Pulse Rate:  [78-87] 79 (06/06 0156) Resp:  [14-17] 14 (06/06 0156) BP: (125-145)/(64-68) 145/68 (06/06 0156) SpO2:  [96 %-98 %] 96 % (06/06 0156) Weight:  [101 kg-101.9 kg] 101 kg (06/05 1329) Last BM Date : 01/21/23 Last BM recorded by nurses in past 5 days Stool Type: Type 6 (Mushy consistency with ragged edges); Type 7 (Liquid consistency with no solid pieces) (01/08/2023  5:52 AM)  General:   female in no acute distress, icteric sclera Heart:  Regular rate and rhythm; no murmurs Pulm: Clear anteriorly; no wheezing Abdomen: soft, nondistended, normal bowel sounds in all quadrants. Nontender without guarding. No organomegaly appreciated. Extremities:  No edema Neurologic:  Alert and  oriented x4;  No focal deficits.  Psych:  Cooperative. Normal mood and affect.  Intake/Output from previous day: No intake/output data recorded. Intake/Output this shift: No intake/output data recorded.  Studies/Results: MR ABDOMEN MRCP W WO CONTAST  Result Date: 01/08/2023 CLINICAL DATA:  Jaundice.  Liver metastasis. EXAM: MRI ABDOMEN WITHOUT AND WITH CONTRAST (INCLUDING MRCP) TECHNIQUE: Multiplanar multisequence MR imaging of the abdomen was performed both before and after the administration of intravenous contrast. Heavily T2-weighted images of the biliary and pancreatic ducts were obtained, and three-dimensional MRCP images were rendered by post processing. CONTRAST:  10mL GADAVIST GADOBUTROL 1 MMOL/ML IV SOLN COMPARISON:  12/31/2022 FINDINGS: Lower chest: Trace  right pleural fluid. Hepatobiliary: As noted on recent MRIs there are multifocal hypoenhancing liver lesions suspicious for metastatic disease. Index lesion within the lateral dome measures 2.6 x 1.7 cm, image 16/18. This is unchanged from 12/22/2022. Index lesion within the posteroinferior right lobe of liver measures 1.8 x 1.3 cm, image 44/18. Previously 1.6 x 1.2 cm. Also in the posterior right lobe of liver there is a 1.4 by 0.9 cm lesion. Previously 1.1 x 0.9 cm. Small gallstones. Mild gallbladder wall thickening measures up to 3 mm. Trace pericholecystic fluid. Mild intrahepatic bile duct dilatation. Unchanged from 12/22/2022. On the MRCP mid images the there is abrupt termination of the converging intrahepatic bile loops centrally suggesting underlying central obstructing lesion, image 4/12. The common bile duct appears nondilated. Pancreas: No mass, inflammatory changes, or other parenchymal abnormality identified. Spleen:  Within normal limits in size and appearance. Adrenals/Urinary Tract: Normal adrenal glands. Small left kidney cysts are identified which measure up to 8 mm. No follow-up imaging recommended. Stomach/Bowel: Visualized portions within the abdomen are unremarkable. Vascular/Lymphatic: Normal appearance of the abdominal aorta. Patent upper abdominal vascularity. 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 Enlarged lymph nodes within the hepatic duodenal ligament and retroperitoneum are again noted. Other: Mild perihepatic ascites and perisplenic ascites. Similar to 12/31/22 Musculoskeletal: No suspicious bone lesions identified. IMPRESSION: 1. Again noted are multifocal hypoenhancing liver lesions suspicious for metastatic disease. The dominant lesion within the lateral dome of liver is unchanged from previous exam. 2. Mild intrahepatic bile duct dilatation with abrupt termination of the converging intrahepatic bile loops centrally suggesting  underlying central obstructing   lesion versus mass effect from enlarged portahepatic lymph nodes. The common bile duct appears nondilated. Consider further evaluation with ERCP. 3. Gallstones with mild gallbladder wall thickening. 4. Enlarged lymph nodes within the hepatic duodenal ligament and retroperitoneum are again noted. Concerning for nodal metastasis. 5. Mild perihepatic ascites and perisplenic ascites. Electronically Signed   By: Taylor  Stroud M.D.   On: 01/08/2023 06:44   MR 3D Recon At Scanner  Result Date: 01/08/2023 CLINICAL DATA:  Jaundice.  Liver metastasis. EXAM: MRI ABDOMEN WITHOUT AND WITH CONTRAST (INCLUDING MRCP) TECHNIQUE: Multiplanar multisequence MR imaging of the abdomen was performed both before and after the administration of intravenous contrast. Heavily T2-weighted images of the biliary and pancreatic ducts were obtained, and three-dimensional MRCP images were rendered by post processing. CONTRAST:  10mL GADAVIST GADOBUTROL 1 MMOL/ML IV SOLN COMPARISON:  12/31/2022 FINDINGS: Lower chest: Trace right pleural fluid. Hepatobiliary: As noted on recent MRIs there are multifocal hypoenhancing liver lesions suspicious for metastatic disease. Index lesion within the lateral dome measures 2.6 x 1.7 cm, image 16/18. This is unchanged from 12/22/2022. Index lesion within the posteroinferior right lobe of liver measures 1.8 x 1.3 cm, image 44/18. Previously 1.6 x 1.2 cm. Also in the posterior right lobe of liver there is a 1.4 by 0.9 cm lesion. Previously 1.1 x 0.9 cm. Small gallstones. Mild gallbladder wall thickening measures up to 3 mm. Trace pericholecystic fluid. Mild intrahepatic bile duct dilatation. Unchanged from 12/22/2022. On the MRCP mid images the there is abrupt termination of the converging intrahepatic bile loops centrally suggesting underlying central obstructing lesion, image 4/12. The common bile duct appears nondilated. Pancreas: No mass, inflammatory changes, or other  parenchymal abnormality identified. Spleen:  Within normal limits in size and appearance. Adrenals/Urinary Tract: Normal adrenal glands. Small left kidney cysts are identified which measure up to 8 mm. No follow-up imaging recommended. Stomach/Bowel: Visualized portions within the abdomen are unremarkable. Vascular/Lymphatic: Normal appearance of the abdominal aorta. Patent upper abdominal vascularity. 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 Enlarged lymph nodes within the hepatic duodenal ligament and retroperitoneum are again noted. Other: Mild perihepatic ascites and perisplenic ascites. Similar to 12/31/22 Musculoskeletal: No suspicious bone lesions identified. IMPRESSION: 1. Again noted are multifocal hypoenhancing liver lesions suspicious for metastatic disease. The dominant lesion within the lateral dome of liver is unchanged from previous exam. 2. Mild intrahepatic bile duct dilatation with abrupt termination of the converging intrahepatic bile loops centrally suggesting underlying central obstructing lesion versus mass effect from enlarged portahepatic lymph nodes. The common bile duct appears nondilated. Consider further evaluation with ERCP. 3. Gallstones with mild gallbladder wall thickening. 4. Enlarged lymph nodes within the hepatic duodenal ligament and retroperitoneum are again noted. Concerning for nodal metastasis. 5. Mild perihepatic ascites and perisplenic ascites. Electronically Signed   By: Taylor  Stroud M.D.   On: 01/08/2023 06:44    Lab Results: Recent Labs    01/07/23 0954 01/08/23 0554  WBC 7.5 6.6  HGB 14.0 12.5  HCT 40.2 36.8  PLT 230 209   BMET Recent Labs    01/07/23 0954 01/08/23 0554  NA 137 132*  K 3.5 3.3*  CL 100 96*  CO2 29 26  GLUCOSE 130* 149*  BUN 9 11  CREATININE 0.91 0.74  CALCIUM 8.7* 8.0*   LFT Recent Labs    01/07/23 1635 01/08/23 0554  PROT 6.6 6.2*  ALBUMIN 2.7* 2.3*  AST 74* 57*  ALT  42 41    ALKPHOS 148* 138*  BILITOT 15.3* 13.5*  BILIDIR 8.0*  --   IBILI NOT CALCULATED  --    PT/INR Recent Labs    01/08/23 0554  LABPROT 16.6*  INR 1.3*     Scheduled Meds:  acidophilus  1 capsule Oral Daily   amLODipine  10 mg Oral Daily   enoxaparin (LOVENOX) injection  50 mg Subcutaneous Q24H   hydrochlorothiazide  12.5 mg Oral Daily   insulin aspart  0-15 Units Subcutaneous TID WC   insulin aspart  0-5 Units Subcutaneous QHS   insulin glargine-yfgn  10 Units Subcutaneous Daily   lisinopril  20 mg Oral Daily   pantoprazole  40 mg Oral BID AC   peg 3350 powder  0.5 kit Oral Once   And   [START ON 01/09/2023] peg 3350 powder  0.5 kit Oral Once   sucralfate  1 g Oral TID WC & HS   Continuous Infusions:    Patient profile:   71-year-old female with 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   Impression:   Hyperbilirubinemia Imaging showing malignant metastasis (unknown primary) - CT and abdominal MRI showed multiple (at least 3) liver lesions, bulky abdominal and retroperitoneal adenopathy, and multiple small lung nodules up to 5 mm, concerning for metastatic malignancy. gallbladder appears diffusely thickened, with multiple small stones.  No other primary tumor seen on the images. -T bili 13.5 today, improved -Direct bilirubin 8.0 yesterday -AST 57/ALT 41/alk phos 138, improved -No leukocytosis -C. 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 -CEA 18.9 -AFP 10.2 - PT 16.6., INR 1.3 - ACE pending -Liver biopsy nondiagnostic for malignant cells -EUS with FNA of peripancreatic and portal lns negative - REPEAT MRCP 6/5: Liver mets.  Mild intrahepatic bile duct dilation with termination of converging intrahepatic bile loops suggesting underlying central obstructing lesion versus mass effect from enlarged porta hepatic  lymph nodes.  Gallstones with gallbladder wall thickening.  Enlarged lymph nodes in hepatic duodenal ligament and retroperitoneum concerning for nodal metastasis Bilirubin somewhat improving.  Repeat MRCP shows mild intrahepatic bile duct dilation.    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  -I thoroughly discussed procedure with the patient to include nature, alternatives, benefits, and risks (including but not limited to post ERCP pancreatitis, bleeding, infection, perforation, anesthesia/cardiac pulmonary complications). Discussed risk of pancreatitis associated with ERCP. Patient verbalized understanding and gave verbal consent to proceed with ERCP. - Cancel colonoscopy tomorrow. Pending workup, may still need colonoscopy at some point during admission or in the future.   Regina Mckay  01/08/2023, 9:36 AM    Attending physician's note   I have taken history, reviewed the chart and examined the patient. I performed a substantive portion of this encounter, including complete performance of at least one of the key components, in conjunction with the APP. I agree with the Advanced Practitioner's note, impression and recommendations.   MRI/MRCP showing -Mild IHBR dil with abrupt cut off at hilum s/o central obstructing lesion (?LN).  Recommended ERCP -Multifocal liver lesions suspicious for metastatic disease -Cholelithiasis -Enlarged mesenteric/retroperitoneal lymph nodes c/w likely nodal metastases  TB 15 today with alk phos 148, D. Bili 8. No itching    Plan: -ERCP w/t brushings/stent 6/7 (endo/anesthesia could not accommodate us today). -Would need IR liver Bx later +/- PTC/PTBD -Follow CA 19-9 -Hold off on colon. -Trend CBC, CMP  I have discussed procedure, risks and benefits  with the pt and Regina Mckay (pt's sister) including risks of pancreatitis, bleeding, perforation, failed procedure since it would be technically challenging due to intrahepatic nature of lesions.  Benefits were also discussed.  They wish to proceed.  I have also discussed with Dr. Mansouraty and Dr Cunningham(pt's primary GI)   Raj Monna Crean, MD  GI [phone]     

## 2023-01-08 NOTE — Progress Notes (Signed)
Initial Nutrition Assessment  DOCUMENTATION CODES:   Obesity unspecified  INTERVENTION:   -Ensure MAX Protein po BID, each supplement provides 150 kcal and 30 grams of protein   NUTRITION DIAGNOSIS:   Increased nutrient needs related to acute illness as evidenced by estimated needs.  GOAL:   Patient will meet greater than or equal to 90% of their needs  MONITOR:   PO intake, Supplement acceptance, Weight trends, Labs, I & O's  REASON FOR ASSESSMENT:   Malnutrition Screening Tool    ASSESSMENT:   72 y.o. female with medical history significant for obesity, hypertension, insulin-dependent type 2 diabetes admitted to the hospital with continued weakness and elevated bilirubin.  She was admitted to [facility] 5/18 to 5/22 during which time she was diagnosed with Klebsiella bacteremia of unknown origin.  He was then again admitted to the hospital with diarrhea, weakness, and elevated T. bili of 9 from 5/28 to 5/31.  During that hospital admission, she was seen by gastroenterology, she underwent EUS with brushings and biopsies which were normal.  Patient recently discharged 5/31. At that time, pt was ordered Ensure Max supplements. Will resume for this admission. Continues to have appetite but will be NPO tomorrow for planned ERCP. Pt undergoing work-up for suspected metastatic disease.   UBW ~238 lbs. Per weight records, pt has lost 17 lbs since 5/18 (7% wt loss x <1 month, significant for time frame). Of note, pt has been on Ozempic for 2 years.   Medications: Risaquad, KLOR-CON, Carafate  Labs reviewed: CBGs: 72-146 Low Na Low K   Diet Order:   Diet Order             Diet NPO time specified  Diet effective midnight           Diet Carb Modified Fluid consistency: Thin; Room service appropriate? Yes  Diet effective now                   EDUCATION NEEDS:   No education needs have been identified at this time  Skin:  Skin Assessment: Reviewed RN  Assessment  Last BM:  6/6 -type 6&7  Height:   Ht Readings from Last 1 Encounters:  01/07/23 5\' 3"  (1.6 m)    Weight:   Wt Readings from Last 1 Encounters:  01/07/23 101 kg   BMI:  Body mass index is 39.44 kg/m.  Estimated Nutritional Needs:   Kcal:  1550-1750  Protein:  75-85g  Fluid:  1.7L/day  Tilda Franco, MS, RD, LDN Inpatient Clinical Dietitian Contact information available via Amion

## 2023-01-08 NOTE — Progress Notes (Signed)
   01/08/23 1203  TOC Brief Assessment  Insurance and Status Reviewed  Patient has primary care physician Yes  Home environment has been reviewed From home alone  Prior level of function: Independent  Prior/Current Home Services No current home services  Social Determinants of Health Reivew SDOH reviewed no interventions necessary  Readmission risk has been reviewed Yes  Transition of care needs no transition of care needs at this time   TOC brief assessment completed. No TOC needs noted at this time.

## 2023-01-09 ENCOUNTER — Inpatient Hospital Stay (HOSPITAL_COMMUNITY): Payer: Medicare Other | Admitting: Anesthesiology

## 2023-01-09 ENCOUNTER — Encounter (HOSPITAL_COMMUNITY)
Admission: RE | Disposition: A | Discharge status: Home / Self Care | Payer: Self-pay | Source: Home / Self Care | Attending: Internal Medicine

## 2023-01-09 ENCOUNTER — Inpatient Hospital Stay (HOSPITAL_COMMUNITY): Payer: Medicare Other

## 2023-01-09 ENCOUNTER — Encounter (HOSPITAL_COMMUNITY): Payer: Self-pay | Admitting: Internal Medicine

## 2023-01-09 DIAGNOSIS — K831 Obstruction of bile duct: Secondary | ICD-10-CM

## 2023-01-09 DIAGNOSIS — K838 Other specified diseases of biliary tract: Secondary | ICD-10-CM

## 2023-01-09 HISTORY — PX: BILIARY BRUSHING: SHX6843

## 2023-01-09 HISTORY — PX: ERCP: SHX5425

## 2023-01-09 HISTORY — PX: BILIARY STENT PLACEMENT: SHX5538

## 2023-01-09 HISTORY — PX: REMOVAL OF STONES: SHX5545

## 2023-01-09 HISTORY — PX: SPHINCTEROTOMY: SHX5544

## 2023-01-09 LAB — COMPREHENSIVE METABOLIC PANEL
ALT: 33 U/L (ref 0–44)
AST: 55 U/L — ABNORMAL HIGH (ref 15–41)
Albumin: 2.3 g/dL — ABNORMAL LOW (ref 3.5–5.0)
Alkaline Phosphatase: 123 U/L (ref 38–126)
Anion gap: 12 (ref 5–15)
BUN: 12 mg/dL (ref 8–23)
CO2: 24 mmol/L (ref 22–32)
Calcium: 8.3 mg/dL — ABNORMAL LOW (ref 8.9–10.3)
Chloride: 98 mmol/L (ref 98–111)
Creatinine, Ser: 0.8 mg/dL (ref 0.44–1.00)
GFR, Estimated: >60 mL/min (ref >60)
Glucose, Bld: 124 mg/dL — ABNORMAL HIGH (ref 70–99)
Potassium: 4.2 mmol/L (ref 3.5–5.1)
Sodium: 134 mmol/L — ABNORMAL LOW (ref 135–145)
Total Bilirubin: 15.9 mg/dL — ABNORMAL HIGH (ref 0.3–1.2)
Total Protein: 6.3 g/dL — ABNORMAL LOW (ref 6.5–8.1)

## 2023-01-09 LAB — GLUCOSE, CAPILLARY
Glucose-Capillary: 96 mg/dL (ref 70–99)
Glucose-Capillary: 102 mg/dL — ABNORMAL HIGH (ref 70–99)
Glucose-Capillary: 97 mg/dL (ref 70–99)
Glucose-Capillary: 118 mg/dL — ABNORMAL HIGH (ref 70–99)
Glucose-Capillary: 205 mg/dL — ABNORMAL HIGH (ref 70–99)

## 2023-01-09 LAB — HCV RNA QUANT: HCV Quantitative: NOT DETECTED IU/mL (ref >50)

## 2023-01-09 SURGERY — ERCP, WITH INTERVENTION IF INDICATED
Anesthesia: General

## 2023-01-09 SURGERY — COLONOSCOPY
Anesthesia: Monitor Anesthesia Care

## 2023-01-09 MED ORDER — DEXAMETHASONE SODIUM PHOSPHATE 10 MG/ML IJ SOLN
INTRAMUSCULAR | Status: DC | PRN
  Administered 2023-01-09: 4 mg via INTRAVENOUS

## 2023-01-09 MED ORDER — PHENYLEPHRINE HCL-NACL 20-0.9 MG/250ML-% IV SOLN
INTRAVENOUS | Status: DC | PRN
  Administered 2023-01-09: 40 ug/min via INTRAVENOUS

## 2023-01-09 MED ORDER — AMISULPRIDE (ANTIEMETIC) 5 MG/2ML IV SOLN
10.0000 mg | Freq: Once | INTRAVENOUS | Status: AC
  Administered 2023-01-09: 10 mg via INTRAVENOUS

## 2023-01-09 MED ORDER — INDOMETHACIN 50 MG RE SUPP: 100.0000 mg | Freq: Once | RECTAL | Status: DC

## 2023-01-09 MED ORDER — DICLOFENAC SUPPOSITORY 100 MG
RECTAL | Status: DC | PRN
  Administered 2023-01-09: 100 mg via RECTAL

## 2023-01-09 MED ORDER — FAMOTIDINE 20 MG PO TABS
20.0000 mg | ORAL_TABLET | Freq: Two times a day (BID) | ORAL | Status: DC
  Administered 2023-01-09 – 2023-01-17 (×16): 20 mg via ORAL
  Filled 2023-01-09 (×17): qty 1

## 2023-01-09 MED ORDER — ONDANSETRON HCL 4 MG/2ML IJ SOLN
INTRAMUSCULAR | Status: DC | PRN
  Administered 2023-01-09: 4 mg via INTRAVENOUS

## 2023-01-09 MED ORDER — CIPROFLOXACIN IN D5W 400 MG/200ML IV SOLN
400.0000 mg | Freq: Once | INTRAVENOUS | Status: AC
  Administered 2023-01-09: 400 mg via INTRAVENOUS

## 2023-01-09 MED ORDER — ROCURONIUM BROMIDE 10 MG/ML (PF) SYRINGE
PREFILLED_SYRINGE | INTRAVENOUS | Status: DC | PRN
  Administered 2023-01-09: 10 mg via INTRAVENOUS
  Administered 2023-01-09: 40 mg via INTRAVENOUS
  Administered 2023-01-09: 10 mg via INTRAVENOUS

## 2023-01-09 MED ORDER — LIDOCAINE 2% (20 MG/ML) 5 ML SYRINGE
INTRAMUSCULAR | Status: DC | PRN
  Administered 2023-01-09: 60 mg via INTRAVENOUS

## 2023-01-09 MED ORDER — PHENYLEPHRINE 80 MCG/ML (10ML) SYRINGE FOR IV PUSH (FOR BLOOD PRESSURE SUPPORT)
PREFILLED_SYRINGE | INTRAVENOUS | Status: DC | PRN
  Administered 2023-01-09 (×2): 160 ug via INTRAVENOUS
  Administered 2023-01-09: 240 ug via INTRAVENOUS

## 2023-01-09 MED ORDER — PROPOFOL 10 MG/ML IV BOLUS
INTRAVENOUS | Status: DC | PRN
  Administered 2023-01-09: 200 mg via INTRAVENOUS

## 2023-01-09 MED ORDER — DICLOFENAC SUPPOSITORY 100 MG
RECTAL | Status: AC
  Filled 2023-01-09: qty 1

## 2023-01-09 MED ORDER — FENTANYL CITRATE (PF) 250 MCG/5ML IJ SOLN
INTRAMUSCULAR | Status: DC | PRN
  Administered 2023-01-09: 100 ug via INTRAVENOUS

## 2023-01-09 MED ORDER — FENTANYL CITRATE (PF) 100 MCG/2ML IJ SOLN
INTRAMUSCULAR | Status: AC
  Filled 2023-01-09: qty 2

## 2023-01-09 MED ORDER — LACTATED RINGERS IV SOLN: Freq: Once | INTRAVENOUS | Status: AC

## 2023-01-09 MED ORDER — SODIUM CHLORIDE 0.9 % IV SOLN
INTRAVENOUS | Status: DC | PRN
  Administered 2023-01-09: 32 mL

## 2023-01-09 MED ORDER — CIPROFLOXACIN IN D5W 400 MG/200ML IV SOLN
INTRAVENOUS | Status: AC
  Filled 2023-01-09: qty 200

## 2023-01-09 MED ORDER — SODIUM CHLORIDE 0.9 % IV SOLN: INTRAVENOUS | Status: DC

## 2023-01-09 MED ORDER — GLUCAGON HCL RDNA (DIAGNOSTIC) 1 MG IJ SOLR
INTRAMUSCULAR | Status: DC | PRN
  Administered 2023-01-09 (×4): .25 mg via INTRAVENOUS

## 2023-01-09 MED ORDER — GLUCAGON HCL RDNA (DIAGNOSTIC) 1 MG IJ SOLR
INTRAMUSCULAR | Status: AC
  Filled 2023-01-09: qty 1

## 2023-01-09 MED ORDER — LACTATED RINGERS IV SOLN: INTRAVENOUS | Status: DC

## 2023-01-09 MED ORDER — AMISULPRIDE (ANTIEMETIC) 5 MG/2ML IV SOLN
INTRAVENOUS | Status: AC
  Filled 2023-01-09: qty 4

## 2023-01-09 MED ORDER — CIPROFLOXACIN IN D5W 400 MG/200ML IV SOLN
400.0000 mg | Freq: Two times a day (BID) | INTRAVENOUS | Status: DC
  Administered 2023-01-10 – 2023-01-15 (×10): 400 mg via INTRAVENOUS
  Filled 2023-01-09 (×10): qty 200

## 2023-01-09 MED ORDER — PROPOFOL 10 MG/ML IV BOLUS
INTRAVENOUS | Status: AC
  Filled 2023-01-09: qty 20

## 2023-01-09 MED ORDER — FENTANYL CITRATE (PF) 100 MCG/2ML IJ SOLN
INTRAMUSCULAR | Status: DC | PRN
  Administered 2023-01-09: 100 ug via INTRAVENOUS

## 2023-01-09 MED ORDER — SUGAMMADEX SODIUM 200 MG/2ML IV SOLN
INTRAVENOUS | Status: DC | PRN
  Administered 2023-01-09: 200 mg via INTRAVENOUS

## 2023-01-09 NOTE — Transfer of Care (Signed)
Immediate Anesthesia Transfer of Care Note  Patient: Regina Mckay  Procedure(s) Performed: ENDOSCOPIC RETROGRADE CHOLANGIOPANCREATOGRAPHY (ERCP) SPHINCTEROTOMY BILIARY BRUSHING REMOVAL OF STONES BILIARY STENT PLACEMENT  Patient Location: PACU  Anesthesia Type:General  Level of Consciousness: awake and alert   Airway & Oxygen Therapy: Patient Spontanous Breathing and Patient connected to face mask oxygen  Post-op Assessment: Report given to RN and Post -op Vital signs reviewed and stable  Post vital signs: Reviewed and stable  Last Vitals:  Vitals Value Taken Time  BP 133/67 01/09/23 1800  Temp 36.9 C 01/09/23 1758  Pulse 83 01/09/23 1801  Resp 22 01/09/23 1801  SpO2 98 % 01/09/23 1801  Vitals shown include unvalidated device data.  Last Pain:  Vitals:   01/09/23 1419  TempSrc: Oral  PainSc: 0-No pain         Complications: No notable events documented.

## 2023-01-09 NOTE — Progress Notes (Signed)
[facility] Progress Note  Patient: Regina Mckay     ZOX:[id number]  DOA: 01/07/2023   PCP: [name], NP       Brief hospital course: This is a 72 year old female with a history of meningioma, hypertension, diabetes mellitus, severe esophagitis & erosive gastritis, recent Klebsiella bacteremia in May.  Recent imaging reveals liver lesions suspicious for mets, retroperitoneal lymphadenopathy, pulmonary nodules, supraclavicular and mediastinal lymphadenopathy and thyroid nodules.  Biopsies of liver, gastric mucosa and abdominal lymph node have all been inconclusive.  No CBD dilatation noted on imaging and EUS but T bili rising. She was seen by oncology on 6/5 and noted to have an increasing bilirubin level. She has been admitted for hyperbilirubinemia, nausea, diarrhea and severe weakness.  Subjective:   Increasing indigestion since taking KCL tabs yesterday. Feels like there is a not in her chest but no nausea or vomiting.    Assessment and Plan: Principal Problem:   Hyperbilirubinemia- Liver masses -Hyperbilirubinemia likely secondary to liver masses which appear to be metastasis -EUS was performed on 5/30 and fine-needle aspiration of lymph nodes was negative for malignancy - Patient also has bulky abdominal adenopathy, lung nodules and thyroid nodules -CEA 18.9 - Etiology for above is uncertain  - ERCP planned for today instead of colonoscopy that was originally planned   Active Problems: History of hepatitis - Most likely hepatitis C as hep C antibodies noted to be positive - the patient states hepatitis was previously treated -AFP 10.2 with the upper limit of normal being 9.2   Esophagitis and erosive gastritis - Continue PPI - Although the patient was discharged with a prescription for sucralfate, she states that she never received it from the pharmacy - Sucralfate started in the hospital-she states this has helped her significantly - adding Pepcid today for  persistent symptoms  History of recent bacteremia due to Klebsiella pneumoniae - Finished treatment with Augmentin -completed a total of 14 days of antibiotics    Class 3 severe obesity with serious comorbidity and body mass index (BMI) of 40.0 to 44.9 in adult (HCC) Body mass index is 39.44 kg/m.    DM (diabetes mellitus), type 2 with ophthalmic complications (HCC) Carb modified diet, Semglee and NovoLog sliding scale ordered  Hypertension Continue amlodipine and lisinopril     Code Status: Full Code Consultants: GI Level of Care: Level of care: Med-Surg Total time on patient care: 30 minutes DVT prophylaxis:  SCDs Start: 01/07/23 1354     Objective:   Vitals:   01/08/23 1100 01/08/23 1329 01/08/23 2033 01/09/23 0520  BP: 139/70 121/72 (!) 138/59 (!) 143/77  Pulse: 95 73 71 77  Resp:   17 18  Temp: 98.7 F (37.1 C) 97.8 F (36.6 C) 98.7 F (37.1 C) 98 F (36.7 C)  TempSrc: Oral Oral Oral Oral  SpO2:  97% 98% 98%  Weight:      Height:       Filed Weights   01/07/23 1329  Weight: 101 kg   Exam: General exam: Appears comfortable  HEENT: oral mucosa moist Respiratory system: Clear to auscultation.  Cardiovascular system: S1 & S2 heard  Gastrointestinal system: Abdomen soft, non-tender, nondistended. Normal bowel sounds  - repetitive belching noted on exam Extremities: No cyanosis, clubbing or edema Psychiatry:  appears depressed    CBC: Recent Labs  Lab 01/07/23 0954 01/08/23 0554  WBC 7.5 6.6  NEUTROABS 5.5  --   HGB 14.0 12.5  HCT 40.2 36.8  MCV 84.8 87.0  PLT 230 209    Basic Metabolic Panel: Recent Labs  Lab 01/07/23 0954 01/08/23 0554  NA 137 132*  K 3.5 3.3*  CL 100 96*  CO2 29 26  GLUCOSE 130* 149*  BUN 9 11  CREATININE 0.91 0.74  CALCIUM 8.7* 8.0*    GFR: Estimated Creatinine Clearance: 71 mL/min (by C-G formula based on SCr of 0.74 mg/dL).  Scheduled Meds:  acidophilus  1 capsule Oral Daily   amLODipine  10 mg Oral Daily    enoxaparin (LOVENOX) injection  50 mg Subcutaneous Q24H   hydrochlorothiazide  12.5 mg Oral Daily   insulin aspart  0-15 Units Subcutaneous TID WC   insulin aspart  0-5 Units Subcutaneous QHS   insulin glargine-yfgn  10 Units Subcutaneous Daily   lisinopril  20 mg Oral Daily   pantoprazole  40 mg Oral BID AC   Ensure Max Protein  11 oz Oral BID   sucralfate  1 g Oral TID WC & HS   Continuous Infusions: Imaging and lab data was personally reviewed MR ABDOMEN MRCP W WO CONTAST  Result Date: 01/08/2023 CLINICAL DATA:  Jaundice.  Liver metastasis. EXAM: MRI ABDOMEN WITHOUT AND WITH CONTRAST (INCLUDING MRCP) TECHNIQUE: Multiplanar multisequence MR imaging of the abdomen was performed both before and after the administration of intravenous contrast. Heavily T2-weighted images of the biliary and pancreatic ducts were obtained, and three-dimensional MRCP images were rendered by post processing. CONTRAST:  10mL GADAVIST GADOBUTROL 1 MMOL/ML IV SOLN COMPARISON:  12/31/2022 FINDINGS: Lower chest: Trace right pleural fluid. Hepatobiliary: As noted on recent MRIs there are multifocal hypoenhancing liver lesions suspicious for metastatic disease. Index lesion within the lateral dome measures 2.6 x 1.7 cm, image 16/18. This is unchanged from 12/22/2022. Index lesion within the posteroinferior right lobe of liver measures 1.8 x 1.3 cm, image 44/18. Previously 1.6 x 1.2 cm. Also in the posterior right lobe of liver there is a 1.4 by 0.9 cm lesion. Previously 1.1 x 0.9 cm. Small gallstones. Mild gallbladder wall thickening measures up to 3 mm. Trace pericholecystic fluid. Mild intrahepatic bile duct dilatation. Unchanged from 12/22/2022. On the MRCP mid images the there is abrupt termination of the converging intrahepatic bile loops centrally suggesting underlying central obstructing lesion, image 4/12. The common bile duct appears nondilated. Pancreas: No mass, inflammatory changes, or other parenchymal abnormality  identified. Spleen:  Within normal limits in size and appearance. Adrenals/Urinary Tract: Normal adrenal glands. Small left kidney cysts are identified which measure up to 8 mm. No follow-up imaging recommended. Stomach/Bowel: Visualized portions within the abdomen are unremarkable. Vascular/Lymphatic: Normal appearance of the abdominal aorta. Patent upper abdominal vascularity. 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 Enlarged lymph nodes within the hepatic duodenal ligament and retroperitoneum are again noted. Other: Mild perihepatic ascites and perisplenic ascites. Similar to 12/31/22 Musculoskeletal: No suspicious bone lesions identified. IMPRESSION: 1. Again noted are multifocal hypoenhancing liver lesions suspicious for metastatic disease. The dominant lesion within the lateral dome of liver is unchanged from previous exam. 2. Mild intrahepatic bile duct dilatation with abrupt termination of the converging intrahepatic bile loops centrally suggesting underlying central obstructing lesion versus mass effect from enlarged portahepatic lymph nodes. The common bile duct appears nondilated. Consider further evaluation with ERCP. 3. Gallstones with mild gallbladder wall thickening. 4. Enlarged lymph nodes within the hepatic duodenal ligament and retroperitoneum are again noted. Concerning for nodal metastasis. 5. Mild perihepatic ascites and perisplenic ascites. Electronically Signed  By: Signa Kell M.D.   On: 01/08/2023 06:44   MR 3D Recon At Scanner  Result Date: 01/08/2023 CLINICAL DATA:  Jaundice.  Liver metastasis. EXAM: MRI ABDOMEN WITHOUT AND WITH CONTRAST (INCLUDING MRCP) TECHNIQUE: Multiplanar multisequence MR imaging of the abdomen was performed both before and after the administration of intravenous contrast. Heavily T2-weighted images of the biliary and pancreatic ducts were obtained, and three-dimensional MRCP images were rendered by post  processing. CONTRAST:  10mL GADAVIST GADOBUTROL 1 MMOL/ML IV SOLN COMPARISON:  12/31/2022 FINDINGS: Lower chest: Trace right pleural fluid. Hepatobiliary: As noted on recent MRIs there are multifocal hypoenhancing liver lesions suspicious for metastatic disease. Index lesion within the lateral dome measures 2.6 x 1.7 cm, image 16/18. This is unchanged from 12/22/2022. Index lesion within the posteroinferior right lobe of liver measures 1.8 x 1.3 cm, image 44/18. Previously 1.6 x 1.2 cm. Also in the posterior right lobe of liver there is a 1.4 by 0.9 cm lesion. Previously 1.1 x 0.9 cm. Small gallstones. Mild gallbladder wall thickening measures up to 3 mm. Trace pericholecystic fluid. Mild intrahepatic bile duct dilatation. Unchanged from 12/22/2022. On the MRCP mid images the there is abrupt termination of the converging intrahepatic bile loops centrally suggesting underlying central obstructing lesion, image 4/12. The common bile duct appears nondilated. Pancreas: No mass, inflammatory changes, or other parenchymal abnormality identified. Spleen:  Within normal limits in size and appearance. Adrenals/Urinary Tract: Normal adrenal glands. Small left kidney cysts are identified which measure up to 8 mm. No follow-up imaging recommended. Stomach/Bowel: Visualized portions within the abdomen are unremarkable. Vascular/Lymphatic: Normal appearance of the abdominal aorta. Patent upper abdominal vascularity. 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 Enlarged lymph nodes within the hepatic duodenal ligament and retroperitoneum are again noted. Other: Mild perihepatic ascites and perisplenic ascites. Similar to 12/31/22 Musculoskeletal: No suspicious bone lesions identified. IMPRESSION: 1. Again noted are multifocal hypoenhancing liver lesions suspicious for metastatic disease. The dominant lesion within the lateral dome of liver is unchanged from previous exam. 2. Mild  intrahepatic bile duct dilatation with abrupt termination of the converging intrahepatic bile loops centrally suggesting underlying central obstructing lesion versus mass effect from enlarged portahepatic lymph nodes. The common bile duct appears nondilated. Consider further evaluation with ERCP. 3. Gallstones with mild gallbladder wall thickening. 4. Enlarged lymph nodes within the hepatic duodenal ligament and retroperitoneum are again noted. Concerning for nodal metastasis. 5. Mild perihepatic ascites and perisplenic ascites. Electronically Signed   By: Signa Kell M.D.   On: 01/08/2023 06:44    LOS: 2 days   Author: Calvert Cantor  01/09/2023 7:41 AM  To contact [facility]>   Check the care team in [facility] and look for the attending/consulting [facility] provider listed  Log into [email or web address] and use [facility]'s universal password   Go to> "[facility]"  and find provider  If you still have difficulty reaching the provider, please page the [facility] (Director on Call) for the Hospitalists listed on amion

## 2023-01-09 NOTE — Anesthesia Procedure Notes (Signed)
Procedure Name: Intubation Date/Time: 01/09/2023 3:40 PM  Performed by: [name] Chol, CRNAPre-anesthesia Checklist: Patient identified, Emergency Drugs available, Suction available and Patient being monitored Patient Re-evaluated:Patient Re-evaluated prior to induction Oxygen Delivery Method: Circle System Utilized Preoxygenation: Pre-oxygenation with 100% oxygen Induction Type: IV induction Ventilation: Mask ventilation without difficulty Laryngoscope Size: Miller and 3 Grade View: Grade II Tube type: Oral Tube size: 7.0 mm Number of attempts: 1 Airway Equipment and Method: Stylet and Oral airway Placement Confirmation: ETT inserted through vocal cords under direct vision, positive ETCO2 and breath sounds checked- equal and bilateral Secured at: 22 cm Tube secured with: Tape Dental Injury: Teeth and Oropharynx as per pre-operative assessment

## 2023-01-09 NOTE — Anesthesia Preprocedure Evaluation (Addendum)
Anesthesia Evaluation  Patient identified by MRN, date of birth, ID band Patient awake    Reviewed: Allergy & Precautions, NPO status , Patient's Chart, lab work & pertinent test results  Airway Mallampati: III  TM Distance: >3 FB Neck ROM: Full    Dental no notable dental hx. (+) Dental Advisory Given   Pulmonary former smoker   Pulmonary exam normal        Cardiovascular hypertension, Pt. on medications Normal cardiovascular exam     Neuro/Psych  PSYCHIATRIC DISORDERS  Depression     Neuromuscular disease    GI/Hepatic negative GI ROS,,,(+) Hepatitis -  Endo/Other  diabetes, Insulin Dependent  Morbid obesity  Renal/GU negative Renal ROS     Musculoskeletal negative musculoskeletal ROS (+)    Abdominal  (+) + obese  Peds  Hematology negative hematology ROS (+)   Anesthesia Other Findings hyperbilirubinemia, abnormal MRCP with ductal dilation  Reproductive/Obstetrics                              Anesthesia Physical Anesthesia Plan  ASA: 3  Anesthesia Plan: General   Post-op Pain Management:    Induction: Intravenous  PONV Risk Score and Plan: 3 and Ondansetron, Dexamethasone, Treatment may vary due to age or medical condition and Propofol infusion  Airway Management Planned: Oral ETT  Additional Equipment:   Intra-op Plan:   Post-operative Plan: Extubation in OR  Informed Consent: I have reviewed the patients History and Physical, chart, labs and discussed the procedure including the risks, benefits and alternatives for the proposed anesthesia with the patient or authorized representative who has indicated his/her understanding and acceptance.     Dental advisory given  Plan Discussed with: CRNA  Anesthesia Plan Comments:         Anesthesia Quick Evaluation

## 2023-01-09 NOTE — Op Note (Signed)
[facility] Patient Name: Regina Mckay Procedure Date: 01/09/2023 MRN: [id number] Attending MD: Lynann Bologna , MD, [id number] Date of Birth: 1950-12-20 CSN: [id number] Age: 72 Admit Type: Ambulatory Procedure:                ERCP Indications:              Obstructive jaundice d/t hilar stricture. Providers:                Lynann Bologna, MD, Fransisca Connors, Salley Scarlet, Technician, Romie Minus, CRNA Referring MD:             Malachy Mood Medicines:                General Anesthesia Complications:            No immediate complications. Estimated Blood Loss:     Estimated blood loss: none. Procedure:                Pre-Anesthesia Assessment:                           - Prior to the procedure, a History and Physical                            was performed, and patient medications and                            allergies were reviewed. The patient's tolerance of                            previous anesthesia was also reviewed. The risks                            and benefits of the procedure and the sedation                            options and risks were discussed with the patient.                            All questions were answered, and informed consent                            was obtained. Prior Anticoagulants: The patient has                            taken no anticoagulant or antiplatelet agents. ASA                            Grade Assessment: III - A patient with severe                            systemic disease. After reviewing the risks and  benefits, the patient was deemed in satisfactory                            condition to undergo the procedure.                           After obtaining informed consent, the scope was                            passed under direct vision. Throughout the                            procedure, the patient's blood pressure, pulse, and                             oxygen saturations were monitored continuously. The                            [id number]-[id number] ([id number]) Olympus duodenoscope was                            introduced through the mouth, and used to inject                            contrast into and used to inject contrast into the                            bile duct. The ERCP was extremely difficult due to                            abnormal anatomy. The duodenum was somewhat                            distorted. The patient tolerated the procedure well. Scope In: Scope Out: Findings:      The scout film was normal. The esophagus was successfully intubated       under direct vision. The scope was advanced to major papilla in the       descending duodenum without detailed examination of the pharynx, larynx       and associated structures, and upper GI tract. The upper GI tract was       grossly normal.      The major papilla was protruding with few folds. Initial few attempts at       cannulation were unsuccessful. Wire did did find pancreatic duct.       Thereafter, CBD was deeply cannulated using a double wire technique and       contrast injected. Initially the contrast would enter gallbladder       through the cystic duct. Few filling defects were noted in the       gallbladder consistent with cholelithiasis. 9 mm balloon (with injection       above) was inflated in the common hepatic duct and cholangiogram       performed. A tight stricture (1 cm) was noted at the confluence of right       and left hepatic ducts involving proximal  common hepatic duct (Bismuth       IV). The left system was significantly more dilated than the right       system. Bile duct was swept with a 9 mm balloon resulting in extraction       of some sludge. No stones were noted.      Initially the 0.025 wire entered the R hepatic duct. Multiple brushings       were obtained from the stricture. Second wire (0.025) was inserted into       the left hepatic duct.       A 6 mm biliary sphincterotomy was made with a monofilament traction       (standard) sphincterotome using ERBE's endocut electrocautery. There was       no post-sphincterotomy bleeding. One 7 Fr by 12 cm plastic stent with a       single internal flap was placed into the left hepatic bile duct. The       stent was in good position.      Attempt at insertion of 7 Fr stent into the right hepatic system       resulted in dislodgment of right hepatic wire. The sphincterotome was       reinserted and right ductal system deeply cannulated using a 0.035 wire.       It was difficult to insert the 7 Fr stent into the right system despite       of multiple attempts. Unfortunately, the wire got dislodged again.      Due to prolonged procedure, we decided to hold off on further attempts.      Pancreatic duct was initially cannulated with 0.025 wire. Pancreas stent       was not inserted since that wire dislodged. Indocin suppositories were       given. Aggressive IV hydration protocol was followed. Impression:               - Moderate biliary stricture involving CHD/R/L                            hepatic ducts. Could be due to extrinsic                            compression. S/P brushings                           - A biliary sphincterotomy was performed.                           - S/P insertion of left hepatic duct stent Moderate Sedation:      Not Applicable - Patient had care per Anesthesia. Recommendation:           - Clear liquid diet.                           - Watch for pancreatitis, bleeding, perforation,                            and cholangitis.                           - Would continue Cipro 400 mg IV every 12 hours.                           -  Trend CBC, LFTs.                           - Follow CA 19-9                           - If no improvement or minimal improvement in LFTs,                            then would ask Dr. Meridee Score (our advanced                             endoscopist) to attempt another ERCP at later date.                           - The findings and recommendations were discussed                            with the patient's family. Procedure Code(s):        --- Professional ---                           [phone], Endoscopic retrograde                            cholangiopancreatography (ERCP); with placement of                            endoscopic stent into biliary or pancreatic duct,                            including pre- and post-dilation and guide wire                            passage, when performed, including sphincterotomy,                            when performed, each stent                           [id number], Endoscopic retrograde                            cholangiopancreatography (ERCP); with removal of                            calculi/debris from biliary/pancreatic duct(s)                           60454, Endoscopic catheterization of the biliary                            ductal system, radiological supervision and                            interpretation Diagnosis Code(s):        ---  Professional ---                           K83.1, Obstruction of bile duct                           K83.8, Other specified diseases of biliary tract CPT copyright 2022 [employer] Association. All rights reserved. The codes documented in this report are preliminary and upon coder review may  be revised to meet current compliance requirements. Lynann Bologna, MD 01/09/2023 6:17:58 PM This report has been signed electronically. Number of Addenda: 0

## 2023-01-09 NOTE — Interval H&P Note (Signed)
History and Physical Interval Note:  01/09/2023 3:24 PM  Regina Mckay  has presented today for surgery, with the diagnosis of hyperbilirubinemia, abnormal MRCP with ductal dilation.  The various methods of treatment have been discussed with the patient and family. After consideration of risks, benefits and other options for treatment, the patient has consented to  Procedure(s): ENDOSCOPIC RETROGRADE CHOLANGIOPANCREATOGRAPHY (ERCP) (N/A) as a surgical intervention.  The patient's history has been reviewed, patient examined, no change in status, stable for surgery.  I have reviewed the patient's chart and labs.  Questions were answered to the patient's satisfaction.     Lynann Bologna

## 2023-01-10 DIAGNOSIS — K760 Fatty (change of) liver, not elsewhere classified: Secondary | ICD-10-CM | POA: Diagnosis not present

## 2023-01-10 DIAGNOSIS — Z6841 Body Mass Index (BMI) 40.0 and over, adult: Secondary | ICD-10-CM

## 2023-01-10 DIAGNOSIS — E1139 Type 2 diabetes mellitus with other diabetic ophthalmic complication: Secondary | ICD-10-CM

## 2023-01-10 DIAGNOSIS — K838 Other specified diseases of biliary tract: Secondary | ICD-10-CM | POA: Diagnosis not present

## 2023-01-10 DIAGNOSIS — B961 Klebsiella pneumoniae [K. pneumoniae] as the cause of diseases classified elsewhere: Secondary | ICD-10-CM

## 2023-01-10 DIAGNOSIS — K831 Obstruction of bile duct: Secondary | ICD-10-CM | POA: Diagnosis not present

## 2023-01-10 LAB — HEPATIC FUNCTION PANEL
ALT: 34 U/L (ref 0–44)
AST: 58 U/L — ABNORMAL HIGH (ref 15–41)
Albumin: 2.3 g/dL — ABNORMAL LOW (ref 3.5–5.0)
Alkaline Phosphatase: 125 U/L (ref 38–126)
Bilirubin, Direct: 9.1 mg/dL — ABNORMAL HIGH (ref 0.0–0.2)
Indirect Bilirubin: 6.3 mg/dL — ABNORMAL HIGH (ref 0.3–0.9)
Total Bilirubin: 15.4 mg/dL — ABNORMAL HIGH (ref 0.3–1.2)
Total Protein: 6.3 g/dL — ABNORMAL LOW (ref 6.5–8.1)

## 2023-01-10 LAB — COMPREHENSIVE METABOLIC PANEL
ALT: 37 U/L (ref 0–44)
AST: 68 U/L — ABNORMAL HIGH (ref 15–41)
Albumin: 2.6 g/dL — ABNORMAL LOW (ref 3.5–5.0)
Alkaline Phosphatase: 152 U/L — ABNORMAL HIGH (ref 38–126)
Anion gap: 15 (ref 5–15)
BUN: 15 mg/dL (ref 8–23)
CO2: 22 mmol/L (ref 22–32)
Calcium: 8.6 mg/dL — ABNORMAL LOW (ref 8.9–10.3)
Chloride: 97 mmol/L — ABNORMAL LOW (ref 98–111)
Creatinine, Ser: 0.77 mg/dL (ref 0.44–1.00)
GFR, Estimated: >60 mL/min (ref >60)
Glucose, Bld: 167 mg/dL — ABNORMAL HIGH (ref 70–99)
Potassium: 4.2 mmol/L (ref 3.5–5.1)
Sodium: 134 mmol/L — ABNORMAL LOW (ref 135–145)
Total Bilirubin: 17.2 mg/dL — ABNORMAL HIGH (ref 0.3–1.2)
Total Protein: 6.8 g/dL (ref 6.5–8.1)

## 2023-01-10 LAB — CBC WITH DIFFERENTIAL/PLATELET
Abs Immature Granulocytes: 0.02 10*3/uL (ref 0.00–0.07)
Basophils Absolute: 0 10*3/uL (ref 0.0–0.1)
Basophils Relative: 0 %
Eosinophils Absolute: 0 10*3/uL (ref 0.0–0.5)
Eosinophils Relative: 0 %
HCT: 37.8 % (ref 36.0–46.0)
Hemoglobin: 12.7 g/dL (ref 12.0–15.0)
Immature Granulocytes: 0 %
Lymphocytes Relative: 10 %
Lymphs Abs: 0.7 10*3/uL (ref 0.7–4.0)
MCH: 29.5 pg (ref 26.0–34.0)
MCHC: 33.6 g/dL (ref 30.0–36.0)
MCV: 87.7 fL (ref 80.0–100.0)
Monocytes Absolute: 0.8 10*3/uL (ref 0.1–1.0)
Monocytes Relative: 11 %
Neutro Abs: 5.7 10*3/uL (ref 1.7–7.7)
Neutrophils Relative %: 79 %
Platelets: 203 10*3/uL (ref 150–400)
RBC: 4.31 MIL/uL (ref 3.87–5.11)
RDW: 17.2 % — ABNORMAL HIGH (ref 11.5–15.5)
WBC: 7.2 10*3/uL (ref 4.0–10.5)
nRBC: 0 % (ref 0.0–0.2)

## 2023-01-10 LAB — BASIC METABOLIC PANEL
Anion gap: 13 (ref 5–15)
BUN: 16 mg/dL (ref 8–23)
CO2: 24 mmol/L (ref 22–32)
Calcium: 8.3 mg/dL — ABNORMAL LOW (ref 8.9–10.3)
Chloride: 94 mmol/L — ABNORMAL LOW (ref 98–111)
Creatinine, Ser: 1.2 mg/dL — ABNORMAL HIGH (ref 0.44–1.00)
GFR, Estimated: 48 mL/min — ABNORMAL LOW (ref >60)
Glucose, Bld: 193 mg/dL — ABNORMAL HIGH (ref 70–99)
Potassium: 4 mmol/L (ref 3.5–5.1)
Sodium: 131 mmol/L — ABNORMAL LOW (ref 135–145)

## 2023-01-10 LAB — GLUCOSE, CAPILLARY
Glucose-Capillary: 178 mg/dL — ABNORMAL HIGH (ref 70–99)
Glucose-Capillary: 143 mg/dL — ABNORMAL HIGH (ref 70–99)
Glucose-Capillary: 230 mg/dL — ABNORMAL HIGH (ref 70–99)
Glucose-Capillary: 200 mg/dL — ABNORMAL HIGH (ref 70–99)

## 2023-01-10 LAB — CANCER ANTIGEN 19-9: CA 19-9: 33659 U/mL — ABNORMAL HIGH (ref 0–35)

## 2023-01-10 LAB — ANGIOTENSIN CONVERTING ENZYME: Angiotensin-Converting Enzyme: 11 U/L — ABNORMAL LOW (ref 14–82)

## 2023-01-10 MED ORDER — SODIUM CHLORIDE 0.9 % IV SOLN: INTRAVENOUS | Status: DC

## 2023-01-10 NOTE — Progress Notes (Signed)
[facility] Progress Note  Patient: Regina Mckay     WUJ:[id number]  DOA: 01/07/2023   PCP: [name], NP       Brief hospital course: This is a 72 year old female with a history of meningioma, hypertension, diabetes mellitus, severe esophagitis & erosive gastritis, recent Klebsiella bacteremia in May.  Recent imaging reveals liver lesions suspicious for mets, retroperitoneal lymphadenopathy, pulmonary nodules, supraclavicular and mediastinal lymphadenopathy and thyroid nodules.  Biopsies of liver, gastric mucosa and abdominal lymph node have all been inconclusive.  No CBD dilatation noted on imaging and EUS but T bili rising. She was seen by oncology on 6/5 and noted to have an increasing bilirubin level. She has been admitted for hyperbilirubinemia, nausea,and severe weakness.  Subjective:  Persistent indigestion and severe weakness.  Assessment and Plan: Principal Problem:   Hyperbilirubinemia- Liver masses -Hyperbilirubinemia likely secondary to liver masses which appear to be metastasis -EUS was performed on 5/30 and fine-needle aspiration of lymph nodes was negative for malignancy - Patient also has bulky abdominal adenopathy, lung nodules and thyroid nodules -CEA 18.9 - Etiology for above is uncertain  - ERCP completed 6/7. Common, L and r hepatic duct stricture noted and stent placed in the left hepatic duct  Active Problems: History of hepatitis - Most likely hepatitis C as hep C antibodies noted to be positive - the patient states hepatitis was previously treated - HCV RNA ordered and is not detected -AFP 10.2 with the upper limit of normal being 9.2   Esophagitis and erosive gastritis - Continue PPI - Although the patient was discharged with a prescription for sucralfate, she states that she never received it from the pharmacy - Sucralfate started in the hospital-she states this has helped her significantly - added Pepcid on 6/7- indigestion continues  and oral intake is poor  Elevated Cr/ AKI - Cr .80> 1.20 with poor intake- start IVF today  History of recent bacteremia due to Klebsiella pneumoniae - Finished treatment with Augmentin -completed a total of 14 days of antibiotics    Class 3 severe obesity with serious comorbidity and body mass index (BMI) of 40.0 to 44.9 in adult (HCC) Body mass index is 39.44 kg/m.    DM (diabetes mellitus), type 2 with ophthalmic complications (HCC) Carb modified diet, Semglee and NovoLog sliding scale ordered  Hypertension Continue amlodipine and lisinopril     Code Status: Full Code Consultants: GI Level of Care: Level of care: Med-Surg Total time on patient care: 30 minutes DVT prophylaxis:  SCDs Start: 01/07/23 1354     Objective:   Vitals:   01/09/23 1830 01/09/23 1845 01/09/23 2208 01/10/23 0538  BP: 132/64 129/61 (!) 154/67 (!) 141/68  Pulse: 84 80 88 71  Resp: (!) 24 16 18 18   Temp:  98.6 F (37 C) 98.4 F (36.9 C) (!) 97.4 F (36.3 C)  TempSrc:   Oral Oral  SpO2: 93% 94% 97% 97%  Weight:      Height:       Filed Weights   01/07/23 1329  Weight: 101 kg   Exam: General exam: Appears comfortable  HEENT: oral mucosa moist Respiratory system: Clear to auscultation.  Cardiovascular system: S1 & S2 heard  Gastrointestinal system: Abdomen soft, non-tender, nondistended. Normal bowel sounds   Extremities: No cyanosis, clubbing or edema Psychiatry:  Mood & affect appropriate.     CBC: Recent Labs  Lab 01/07/23 0954 01/08/23 0554 01/10/23 0649  WBC 7.5 6.6 7.2  NEUTROABS 5.5  --  5.7  HGB 14.0 12.5 12.7  HCT 40.2 36.8 37.8  MCV 84.8 87.0 87.7  PLT 230 209 203    Basic Metabolic Panel: Recent Labs  Lab 01/07/23 0954 01/08/23 0554 01/09/23 1958 01/10/23 0649  NA 137 132* 134* 131*  K 3.5 3.3* 4.2 4.0  CL 100 96* 98 94*  CO2 29 26 24 24   GLUCOSE 130* 149* 124* 193*  BUN 9 11 12 16   CREATININE 0.91 0.74 0.80 1.20*  CALCIUM 8.7* 8.0* 8.3* 8.3*     GFR: Estimated Creatinine Clearance: 47.3 mL/min (A) (by C-G formula based on SCr of 1.2 mg/dL (H)).  Scheduled Meds:  acidophilus  1 capsule Oral Daily   amLODipine  10 mg Oral Daily   enoxaparin (LOVENOX) injection  50 mg Subcutaneous Q24H   famotidine  20 mg Oral BID   indomethacin  100 mg Rectal Once   insulin aspart  0-15 Units Subcutaneous TID WC   insulin aspart  0-5 Units Subcutaneous QHS   insulin glargine-yfgn  10 Units Subcutaneous Daily   lisinopril  20 mg Oral Daily   pantoprazole  40 mg Oral BID AC   Ensure Max Protein  11 oz Oral BID   sucralfate  1 g Oral TID WC & HS   Continuous Infusions:  ciprofloxacin 400 mg (01/10/23 0539)   Imaging and lab data was personally reviewed DG ERCP  Result Date: 01/09/2023 CLINICAL DATA:  ERCP for abnormal MRCP. EXAM: DG C-ARM 1-60 MIN; ERCP CONTRAST:  Please see operative report. FLUOROSCOPY: Fluoroscopy Time:  7 minutes 27 seconds Radiation Exposure Index (if provided by the fluoroscopic device): 230.61 mGy Number of Acquired Spot Images: 20 COMPARISON:  01/07/2023. FINDINGS: Fluoroscopy was utilized for ERCP. Please see operative report for additional information. IMPRESSION: Intraoperative utilization of fluoroscopy. Electronically Signed   By: Thornell Sartorius M.D.   On: 01/09/2023 20:54   DG C-Arm 1-60 Min  Result Date: 01/09/2023 CLINICAL DATA:  ERCP for abnormal MRCP. EXAM: DG C-ARM 1-60 MIN; ERCP CONTRAST:  Please see operative report. FLUOROSCOPY: Fluoroscopy Time:  7 minutes 27 seconds Radiation Exposure Index (if provided by the fluoroscopic device): 230.61 mGy Number of Acquired Spot Images: 20 COMPARISON:  01/07/2023. FINDINGS: Fluoroscopy was utilized for ERCP. Please see operative report for additional information. IMPRESSION: Intraoperative utilization of fluoroscopy. Electronically Signed   By: Thornell Sartorius M.D.   On: 01/09/2023 20:54    LOS: 3 days   Author: Calvert Cantor  01/10/2023 1:42 PM  To contact [facility]>   Check the care team in [facility] and look for the attending/consulting [facility] provider listed  Log into [email or web address] and use [place] universal password   Go to> "[facility]"  and find provider  If you still have difficulty reaching the provider, please page the [facility] (Director on Call) for the Hospitalists listed on amion

## 2023-01-10 NOTE — Progress Notes (Signed)
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   Plan: -Advance diet -Recheck CBC, CMP in a.m. -If still with jaundice, would consider rpt ERCP with possible hilar stenting with Dr. Meridee Score. -If better, proceed with liver bx -Follow results of CA 19-9 -Follow cytology from ERCP -Discussed in detail with patient and patient's sister     SUBJECTIVE   Doing well except for nausea-which he told me was chronic No abdominal pain No fever or chills Currently on clear liquids.  Would like to eat solid food.    OBJECTIVE:     Vital signs in last 24 hours: Temp:  [97.4 F (36.3 C)-99.7 F (37.6 C)] 99.7 F (37.6 C) (06/08 1402) Pulse Rate:  [71-88] 80 (06/08 1402) Resp:  [16-24] 18 (06/08 0538) BP: (121-154)/(58-68) 136/58 (06/08 1402) SpO2:  [93 %-98 %] 97 % (06/08 1402) Last BM Date : 01/09/23 General:   Alert, well-developed female in NAD EENT:  Normal hearing, icteric sclera, conjunctive pink.  Heart:  Regular rate and rhythm; no murmur.  No lower extremity edema   Pulm: Normal respiratory effort, lungs CTA bilaterally without wheezes or crackles. Abdomen:  Soft, nondistended, nontender.  Normal bowel sounds,.       Neurologic:  Alert and  oriented x4;  grossly normal neurologically. Psych:  Pleasant, cooperative.  Normal mood and affect.   Intake/Output from previous day: 06/07 0701 - 06/08 0700 In: 1232.2 [I.V.:1000; IV Piggyback:232.2] Out: -  Intake/Output this shift: Total I/O In: 420 [P.O.:420] Out: -   Lab Results: Recent Labs    01/08/23 0554 01/10/23 0649  WBC 6.6 7.2  HGB 12.5 12.7  HCT 36.8 37.8  PLT 209 203   BMET Recent Labs    01/08/23 0554 01/09/23 1958 01/10/23 0649  NA 132* 134* 131*  K 3.3* 4.2 4.0  CL 96* 98 94*  CO2 26 24 24   GLUCOSE 149* 124* 193*  BUN 11 12 16   CREATININE 0.74 0.80 1.20*  CALCIUM 8.0*  8.3* 8.3*   LFT Recent Labs    01/09/23 1957 01/09/23 1958  PROT 6.3* 6.3*  ALBUMIN 2.3* 2.3*  AST 58* 55*  ALT 34 33  ALKPHOS 125 123  BILITOT 15.4* 15.9*  BILIDIR 9.1*  --   IBILI 6.3*  --    PT/INR Recent Labs    01/08/23 0554  LABPROT 16.6*  INR 1.3*   Hepatitis Panel No results for input(s): "HEPBSAG", "HCVAB", "HEPAIGM", "HEPBIGM" in the last 72 hours.  DG ERCP  Result Date: 01/09/2023 CLINICAL DATA:  ERCP for abnormal MRCP. EXAM: DG C-ARM 1-60 MIN; ERCP CONTRAST:  Please see operative report. FLUOROSCOPY: Fluoroscopy Time:  7 minutes 27 seconds Radiation Exposure Index (if provided by the fluoroscopic device): 230.61 mGy Number of Acquired Spot Images: 20 COMPARISON:  01/07/2023. FINDINGS: Fluoroscopy was utilized for ERCP. Please see operative report for additional information. IMPRESSION: Intraoperative utilization of fluoroscopy. Electronically Signed   By: Thornell Sartorius M.D.   On: 01/09/2023 20:54   DG C-Arm 1-60 Min  Result Date: 01/09/2023 CLINICAL DATA:  ERCP for abnormal MRCP. EXAM: DG C-ARM 1-60 MIN; ERCP CONTRAST:  Please see operative report. FLUOROSCOPY: Fluoroscopy Time:  7 minutes 27 seconds Radiation Exposure Index (if provided by the fluoroscopic device): 230.61 mGy Number of Acquired Spot Images: 20 COMPARISON:  01/07/2023. FINDINGS: Fluoroscopy was utilized for ERCP. Please see operative report for  additional information. IMPRESSION: Intraoperative utilization of fluoroscopy. Electronically Signed   By: Thornell Sartorius M.D.   On: 01/09/2023 20:54     Principal Problem:   Hyperbilirubinemia Active Problems:   Class 3 severe obesity with serious comorbidity and body mass index (BMI) of 40.0 to 44.9 in adult [facility])   Bacteremia due to Klebsiella pneumoniae   DM (diabetes mellitus), type 2 with ophthalmic complications (HCC)   Liver masses     LOS: 3 days     Edman Circle, MD 01/10/2023, 3:21 PM [facility] [phone]

## 2023-01-11 DIAGNOSIS — K760 Fatty (change of) liver, not elsewhere classified: Secondary | ICD-10-CM | POA: Diagnosis not present

## 2023-01-11 DIAGNOSIS — K831 Obstruction of bile duct: Secondary | ICD-10-CM | POA: Diagnosis not present

## 2023-01-11 DIAGNOSIS — K838 Other specified diseases of biliary tract: Secondary | ICD-10-CM | POA: Diagnosis not present

## 2023-01-11 LAB — COMPREHENSIVE METABOLIC PANEL
ALT: 29 U/L (ref 0–44)
AST: 63 U/L — ABNORMAL HIGH (ref 15–41)
Albumin: 2.4 g/dL — ABNORMAL LOW (ref 3.5–5.0)
Alkaline Phosphatase: 134 U/L — ABNORMAL HIGH (ref 38–126)
Anion gap: 7 (ref 5–15)
BUN: 11 mg/dL (ref 8–23)
CO2: 26 mmol/L (ref 22–32)
Calcium: 7.9 mg/dL — ABNORMAL LOW (ref 8.9–10.3)
Chloride: 100 mmol/L (ref 98–111)
Creatinine, Ser: 0.49 mg/dL (ref 0.44–1.00)
GFR, Estimated: >60 mL/min (ref >60)
Glucose, Bld: 168 mg/dL — ABNORMAL HIGH (ref 70–99)
Potassium: 3.9 mmol/L (ref 3.5–5.1)
Sodium: 133 mmol/L — ABNORMAL LOW (ref 135–145)
Total Bilirubin: 16.5 mg/dL — ABNORMAL HIGH (ref 0.3–1.2)
Total Protein: 6.5 g/dL (ref 6.5–8.1)

## 2023-01-11 LAB — CBC WITH DIFFERENTIAL/PLATELET
Abs Immature Granulocytes: 0.02 10*3/uL (ref 0.00–0.07)
Basophils Absolute: 0 10*3/uL (ref 0.0–0.1)
Basophils Relative: 0 %
Eosinophils Absolute: 0 10*3/uL (ref 0.0–0.5)
Eosinophils Relative: 0 %
HCT: 37.6 % (ref 36.0–46.0)
Hemoglobin: 12.7 g/dL (ref 12.0–15.0)
Immature Granulocytes: 0 %
Lymphocytes Relative: 6 %
Lymphs Abs: 0.5 10*3/uL — ABNORMAL LOW (ref 0.7–4.0)
MCH: 29.4 pg (ref 26.0–34.0)
MCHC: 33.8 g/dL (ref 30.0–36.0)
MCV: 87 fL (ref 80.0–100.0)
Monocytes Absolute: 0.9 10*3/uL (ref 0.1–1.0)
Monocytes Relative: 10 %
Neutro Abs: 7.3 10*3/uL (ref 1.7–7.7)
Neutrophils Relative %: 84 %
Platelets: 198 10*3/uL (ref 150–400)
RBC: 4.32 MIL/uL (ref 3.87–5.11)
RDW: 17 % — ABNORMAL HIGH (ref 11.5–15.5)
WBC: 8.7 10*3/uL (ref 4.0–10.5)
nRBC: 0 % (ref 0.0–0.2)

## 2023-01-11 LAB — GLUCOSE, CAPILLARY
Glucose-Capillary: 148 mg/dL — ABNORMAL HIGH (ref 70–99)
Glucose-Capillary: 154 mg/dL — ABNORMAL HIGH (ref 70–99)
Glucose-Capillary: 172 mg/dL — ABNORMAL HIGH (ref 70–99)
Glucose-Capillary: 141 mg/dL — ABNORMAL HIGH (ref 70–99)

## 2023-01-11 NOTE — Progress Notes (Signed)
Nurse requested Chaplain visit while I was rounding on unit. Pt did not engage in conversation and seemed disinterested in visit, had 2 visitors at bedside. I provided orientation to chaplain services and a calm presence. No further assessment made today.  [address], [place]   01/11/23 1630  Spiritual Encounters  Type of Visit Initial  Care provided to: Patient;Family  Referral source Nurse (RN/NT/LPN)  Reason for visit Routine spiritual support  OnCall Visit Yes  Spiritual Framework  Presenting Themes Meaning/purpose/sources of inspiration  Community/Connection Family  Patient Stress Factors Health changes  Interventions  Spiritual Care Interventions Made Established relationship of care and support;Compassionate presence;Reflective listening  Intervention Outcomes  Outcomes Awareness of support

## 2023-01-11 NOTE — Anesthesia Postprocedure Evaluation (Signed)
Anesthesia Post Note  Patient: Regina Mckay  Procedure(s) Performed: ENDOSCOPIC RETROGRADE CHOLANGIOPANCREATOGRAPHY (ERCP) SPHINCTEROTOMY BILIARY BRUSHING REMOVAL OF STONES BILIARY STENT PLACEMENT     Patient location during evaluation: PACU Anesthesia Type: General Level of consciousness: awake Pain management: pain level controlled Vital Signs Assessment: post-procedure vital signs reviewed and stable Respiratory status: spontaneous breathing, nonlabored ventilation and respiratory function stable Cardiovascular status: blood pressure returned to baseline and stable Postop Assessment: no apparent nausea or vomiting Anesthetic complications: no   No notable events documented.  Last Vitals:  Vitals:   01/10/23 2037 01/11/23 0556  BP: (!) 141/69 (!) 140/59  Pulse: 84 78  Resp: 18 18  Temp: 37.3 C 36.4 C  SpO2: 94% 96%    Last Pain:  Vitals:   01/11/23 0556  TempSrc: Oral  PainSc:                  Almus Woodham P Aspynn Clover

## 2023-01-11 NOTE — Progress Notes (Signed)
[facility] Progress Note  Patient: Regina Mckay     ZOX:[id number]  DOA: 01/07/2023   PCP: [name], NP       Brief hospital course: This is a 72 year old female with a history of meningioma, hypertension, diabetes mellitus, severe esophagitis & erosive gastritis, recent Klebsiella bacteremia in May.  Recent imaging reveals liver lesions suspicious for mets, retroperitoneal lymphadenopathy, pulmonary nodules, supraclavicular and mediastinal lymphadenopathy and thyroid nodules.  Biopsies of liver, gastric mucosa and abdominal lymph node have all been inconclusive.  No CBD dilatation noted on imaging and EUS but T bili rising. She was seen by oncology on 6/5 and noted to have an increasing bilirubin level. She has been admitted for hyperbilirubinemia, nausea,and severe weakness.  Subjective:  No indigestion today. Eating OK. Ambulating more now.   Assessment and Plan: Principal Problem:   Hyperbilirubinemia- Liver masses -Hyperbilirubinemia likely secondary to liver masses which appear to be metastasis -EUS was performed on 5/30 and fine-needle aspiration of lymph nodes was negative for malignancy - Patient also has bulky abdominal adenopathy, lung nodules and thyroid nodules -CEA 18.9 - Etiology for above is uncertain  - ERCP completed 6/7. 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 placed in the left hepatic duct - T bili is 16.5 today and still a bit higher than when she was admited  Active Problems: History of hepatitis - Most likely hepatitis C as hep C antibodies noted to be positive - the patient states hepatitis was previously treated - HCV RNA ordered and is not detected -AFP 10.2 with the upper limit of normal being 9.2   Esophagitis and erosive gastritis - Continue PPI - Although the patient was discharged with a prescription for sucralfate, she states that she never received it from the pharmacy - Sucralfate started in the hospital-she  states this has helped her significantly - added Pepcid on 6/7- indigestion continues and oral intake is poor  Elevated Cr/ AKI - Cr .80> 1.20 with poor intake - IVF started and improved to 0.49 - stop IVF today and continue to encourage oral intake  History of recent bacteremia due to Klebsiella pneumoniae - Finished treatment with Augmentin -completed a total of 14 days of antibiotics    Class 3 severe obesity with serious comorbidity and body mass index (BMI) of 40.0 to 44.9 in adult (HCC) Body mass index is 39.44 kg/m.    DM (diabetes mellitus), type 2 with ophthalmic complications (HCC) - Carb modified diet, Semglee and NovoLog sliding scale ordered  Hypertension Continue amlodipine and lisinopril      Code Status: Full Code Consultants: GI Level of Care: Level of care: Med-Surg Total time on patient care: 30 minutes DVT prophylaxis:  SCDs Start: 01/07/23 1354     Objective:   Vitals:   01/10/23 1402 01/10/23 2037 01/11/23 0556 01/11/23 1413  BP: (!) 136/58 (!) 141/69 (!) 140/59 (!) 155/71  Pulse: 80 84 78 83  Resp:  18 18 15   Temp: 99.7 F (37.6 C) 99.1 F (37.3 C) 97.6 F (36.4 C) 98.5 F (36.9 C)  TempSrc: Oral Oral Oral   SpO2: 97% 94% 96% 96%  Weight:      Height:       Filed Weights   01/07/23 1329  Weight: 101 kg   Exam: General exam: Appears comfortable  HEENT: oral mucosa moist Respiratory system: Clear to auscultation.  Cardiovascular system: S1 & S2 heard  Gastrointestinal system: Abdomen soft, non-tender, nondistended. Normal bowel sounds  Extremities: No cyanosis, clubbing or edema Psychiatry:  flat affect     CBC: Recent Labs  Lab 01/07/23 0954 01/08/23 0554 01/10/23 0649 01/11/23 0631  WBC 7.5 6.6 7.2 8.7  NEUTROABS 5.5  --  5.7 7.3  HGB 14.0 12.5 12.7 12.7  HCT 40.2 36.8 37.8 37.6  MCV 84.8 87.0 87.7 87.0  PLT 230 209 203 198    Basic Metabolic Panel: Recent Labs  Lab 01/08/23 0554 01/09/23 1958 01/10/23 0649  01/10/23 1427 01/11/23 1112  NA 132* 134* 131* 134* 133*  K 3.3* 4.2 4.0 4.2 3.9  CL 96* 98 94* 97* 100  CO2 26 24 24 22 26   GLUCOSE 149* 124* 193* 167* 168*  BUN 11 12 16 15 11   CREATININE 0.74 0.80 1.20* 0.77 0.49  CALCIUM 8.0* 8.3* 8.3* 8.6* 7.9*    GFR: Estimated Creatinine Clearance: 71 mL/min (by C-G formula based on SCr of 0.49 mg/dL).  Scheduled Meds:  acidophilus  1 capsule Oral Daily   amLODipine  10 mg Oral Daily   enoxaparin (LOVENOX) injection  50 mg Subcutaneous Q24H   famotidine  20 mg Oral BID   indomethacin  100 mg Rectal Once   insulin aspart  0-15 Units Subcutaneous TID WC   insulin aspart  0-5 Units Subcutaneous QHS   insulin glargine-yfgn  10 Units Subcutaneous Daily   lisinopril  20 mg Oral Daily   pantoprazole  40 mg Oral BID AC   Ensure Max Protein  11 oz Oral BID   sucralfate  1 g Oral TID WC & HS   Continuous Infusions:  sodium chloride 125 mL/hr at 01/11/23 1126   ciprofloxacin 400 mg (01/11/23 0333)   Imaging and lab data was personally reviewed DG ERCP  Result Date: 01/09/2023 CLINICAL DATA:  ERCP for abnormal MRCP. EXAM: DG C-ARM 1-60 MIN; ERCP CONTRAST:  Please see operative report. FLUOROSCOPY: Fluoroscopy Time:  7 minutes 27 seconds Radiation Exposure Index (if provided by the fluoroscopic device): 230.61 mGy Number of Acquired Spot Images: 20 COMPARISON:  01/07/2023. FINDINGS: Fluoroscopy was utilized for ERCP. Please see operative report for additional information. IMPRESSION: Intraoperative utilization of fluoroscopy. Electronically Signed   By: Thornell Sartorius M.D.   On: 01/09/2023 20:54   DG C-Arm 1-60 Min  Result Date: 01/09/2023 CLINICAL DATA:  ERCP for abnormal MRCP. EXAM: DG C-ARM 1-60 MIN; ERCP CONTRAST:  Please see operative report. FLUOROSCOPY: Fluoroscopy Time:  7 minutes 27 seconds Radiation Exposure Index (if provided by the fluoroscopic device): 230.61 mGy Number of Acquired Spot Images: 20 COMPARISON:  01/07/2023. FINDINGS:  Fluoroscopy was utilized for ERCP. Please see operative report for additional information. IMPRESSION: Intraoperative utilization of fluoroscopy. Electronically Signed   By: Thornell Sartorius M.D.   On: 01/09/2023 20:54    LOS: 4 days   Author: Calvert Cantor  01/11/2023 3:18 PM  To contact [facility]>   Check the care team in [facility] and look for the attending/consulting [facility] provider listed  Log into [email or web address] and use [place]'s universal password   Go to> "[facility]"  and find provider  If you still have difficulty reaching the provider, please page the [facility] (Director on Call) for the Hospitalists listed on amion

## 2023-01-11 NOTE — Progress Notes (Addendum)
Progress Note  Primary GI: Unassigned (Seen by Dr. Tomasa Rand inpatient - [facility])   LOS: 4 days   Chief Complaint: Hyperbilirubinemia   Subjective   Patient states she feels better since being able to eat. Denies abdominal pain, nausea, and vomiting.   Objective   Vital signs in last 24 hours: Temp:  [97.6 F (36.4 C)-99.7 F (37.6 C)] 97.6 F (36.4 C) (06/09 0556) Pulse Rate:  [78-84] 78 (06/09 0556) Resp:  [18] 18 (06/09 0556) BP: (136-141)/(58-69) 140/59 (06/09 0556) SpO2:  [94 %-97 %] 96 % (06/09 0556) Last BM Date : 01/09/23 Last BM recorded by nurses in past 5 days Stool Type: Type 6 (Mushy consistency with ragged edges); Type 7 (Liquid consistency with no solid pieces) (01/08/2023  5:52 AM)  General:   female in no acute distress  Heart:  Regular rate and rhythm; no murmurs Pulm: Clear anteriorly; no wheezing Abdomen: soft, nondistended, normal bowel sounds in all quadrants. Nontender without guarding. No organomegaly appreciated. Extremities:  No edema Neurologic:  Alert and  oriented x4;  No focal deficits.  Psych:  Cooperative. Normal mood and affect.  Intake/Output from previous day: 06/08 0701 - 06/09 0700 In: 2910.8 [P.O.:660; I.V.:1683; IV Piggyback:567.8] Out: 800 [Urine:800] Intake/Output this shift: No intake/output data recorded.  Studies/Results: DG ERCP  Result Date: 01/09/2023 CLINICAL DATA:  ERCP for abnormal MRCP. EXAM: DG C-ARM 1-60 MIN; ERCP CONTRAST:  Please see operative report. FLUOROSCOPY: Fluoroscopy Time:  7 minutes 27 seconds Radiation Exposure Index (if provided by the fluoroscopic device): 230.61 mGy Number of Acquired Spot Images: 20 COMPARISON:  01/07/2023. FINDINGS: Fluoroscopy was utilized for ERCP. Please see operative report for additional information. IMPRESSION: Intraoperative utilization of fluoroscopy. Electronically Signed   By: Thornell Sartorius M.D.   On: 01/09/2023 20:54   DG C-Arm 1-60 Min  Result Date: 01/09/2023 CLINICAL  DATA:  ERCP for abnormal MRCP. EXAM: DG C-ARM 1-60 MIN; ERCP CONTRAST:  Please see operative report. FLUOROSCOPY: Fluoroscopy Time:  7 minutes 27 seconds Radiation Exposure Index (if provided by the fluoroscopic device): 230.61 mGy Number of Acquired Spot Images: 20 COMPARISON:  01/07/2023. FINDINGS: Fluoroscopy was utilized for ERCP. Please see operative report for additional information. IMPRESSION: Intraoperative utilization of fluoroscopy. Electronically Signed   By: Thornell Sartorius M.D.   On: 01/09/2023 20:54    Lab Results: Recent Labs    01/10/23 0649 01/11/23 0631  WBC 7.2 8.7  HGB 12.7 12.7  HCT 37.8 37.6  PLT 203 198   BMET Recent Labs    01/09/23 1958 01/10/23 0649 01/10/23 1427  NA 134* 131* 134*  K 4.2 4.0 4.2  CL 98 94* 97*  CO2 24 24 22   GLUCOSE 124* 193* 167*  BUN 12 16 15   CREATININE 0.80 1.20* 0.77  CALCIUM 8.3* 8.3* 8.6*   LFT Recent Labs    01/09/23 1957 01/09/23 1958 01/10/23 1427  PROT 6.3*   < > 6.8  ALBUMIN 2.3*   < > 2.6*  AST 58*   < > 68*  ALT 34   < > 37  ALKPHOS 125   < > 152*  BILITOT 15.4*   < > 17.2*  BILIDIR 9.1*  --   --   IBILI 6.3*  --   --    < > = values in this interval not displayed.   PT/INR No results for input(s): "LABPROT", "INR" in the last 72 hours.   Scheduled Meds:  acidophilus  1 capsule Oral Daily  amLODipine  10 mg Oral Daily   enoxaparin (LOVENOX) injection  50 mg Subcutaneous Q24H   famotidine  20 mg Oral BID   indomethacin  100 mg Rectal Once   insulin aspart  0-15 Units Subcutaneous TID WC   insulin aspart  0-5 Units Subcutaneous QHS   insulin glargine-yfgn  10 Units Subcutaneous Daily   lisinopril  20 mg Oral Daily   pantoprazole  40 mg Oral BID AC   Ensure Max Protein  11 oz Oral BID   sucralfate  1 g Oral TID WC & HS   Continuous Infusions:  sodium chloride 125 mL/hr at 01/10/23 1532   ciprofloxacin 400 mg (01/11/23 0333)      Patient profile:   72 year old female with 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   -Liver biopsy nondiagnostic for malignant cells -EUS with FNA of peripancreatic and portal lns negative   REPEAT MRCP 6/5: Liver mets.  Mild intrahepatic bile duct dilation with termination of converging intrahepatic bile loops suggesting underlying central obstructing lesion versus mass effect from enlarged porta hepatic lymph nodes.  Gallstones with gallbladder wall thickening.  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 Could be due to extrinsic compression. S/p brushings. S/p insertion of left hepatic duct stent. 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 -CEA 18.9 -AFP 10.2 -CA 19-9 33,659 CA 19-9 elevation concerning for cholangiocarcinoma. Still awaiting brushing results.    Plan:   - recheck bilirubin today. If not improved, will discuss with Dr. Meridee Score possibility of further stenting. - continue to trend daily bilirubin - continue supportive care  Regina Mckay  01/11/2023, 9:00 AM     Attending physician's note   I have taken history, reviewed the chart and examined the patient. I performed a substantive portion of this encounter, including complete performance of at least one of the key components, in conjunction with the APP. I agree with the Advanced Practitioner's note, impression and recommendations.   Denies having any problems except mild chronic nausea.  No abdo pain.  No itching. TB 17 yesterday  CA19-9: 33,659 (significantly elevated) Difficult ERCP- bismuth type IV stricture, S/P L stenting. She also has biliary sphincterotomy.  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 If TB still elevated, rpt ERCP  by Dr Meridee Score with R biliary stenting +/- SPY bx, +/- rpt EUS. I am not sure what his schedule is like tomorrow. Will keep pt NPO after MN. -Have left message for pathology to rush biliary brushings -Advance diet for now. -Discussed in detail with pt.  Dr. Leone Payor taking over the service tomorrow Will discuss with him as well.   Edman Circle, MD [facility] [phone]

## 2023-01-12 ENCOUNTER — Inpatient Hospital Stay (HOSPITAL_COMMUNITY): Payer: Medicare Other | Admitting: Certified Registered"

## 2023-01-12 ENCOUNTER — Encounter (HOSPITAL_COMMUNITY)
Admission: RE | Disposition: A | Discharge status: Home / Self Care | Payer: Self-pay | Source: Home / Self Care | Attending: Internal Medicine

## 2023-01-12 ENCOUNTER — Encounter (HOSPITAL_COMMUNITY): Payer: Self-pay | Admitting: Internal Medicine

## 2023-01-12 ENCOUNTER — Inpatient Hospital Stay (HOSPITAL_COMMUNITY): Payer: Medicare Other

## 2023-01-12 DIAGNOSIS — C801 Malignant (primary) neoplasm, unspecified: Secondary | ICD-10-CM

## 2023-01-12 DIAGNOSIS — K769 Liver disease, unspecified: Secondary | ICD-10-CM

## 2023-01-12 DIAGNOSIS — Z4659 Encounter for fitting and adjustment of other gastrointestinal appliance and device: Secondary | ICD-10-CM

## 2023-01-12 DIAGNOSIS — K3189 Other diseases of stomach and duodenum: Secondary | ICD-10-CM

## 2023-01-12 DIAGNOSIS — K838 Other specified diseases of biliary tract: Secondary | ICD-10-CM | POA: Diagnosis not present

## 2023-01-12 DIAGNOSIS — K831 Obstruction of bile duct: Secondary | ICD-10-CM

## 2023-01-12 DIAGNOSIS — Z9889 Other specified postprocedural states: Secondary | ICD-10-CM

## 2023-01-12 HISTORY — PX: BILIARY BRUSHING: SHX6843

## 2023-01-12 HISTORY — PX: STENT REMOVAL: SHX6421

## 2023-01-12 HISTORY — PX: ESOPHAGOGASTRODUODENOSCOPY (EGD) WITH PROPOFOL: SHX5813

## 2023-01-12 HISTORY — PX: BIOPSY: SHX5522

## 2023-01-12 HISTORY — PX: BILIARY STENT PLACEMENT: SHX5538

## 2023-01-12 HISTORY — PX: REMOVAL OF STONES: SHX5545

## 2023-01-12 HISTORY — PX: BILIARY DILATION: SHX6850

## 2023-01-12 HISTORY — PX: SUBMUCOSAL TATTOO INJECTION: SHX6856

## 2023-01-12 HISTORY — PX: ERCP: SHX5425

## 2023-01-12 HISTORY — PX: HEMOSTASIS CLIP PLACEMENT: SHX6857

## 2023-01-12 LAB — COMPREHENSIVE METABOLIC PANEL
ALT: 30 U/L (ref 0–44)
AST: 49 U/L — ABNORMAL HIGH (ref 15–41)
Albumin: 2.1 g/dL — ABNORMAL LOW (ref 3.5–5.0)
Alkaline Phosphatase: 117 U/L (ref 38–126)
Anion gap: 10 (ref 5–15)
BUN: 7 mg/dL — ABNORMAL LOW (ref 8–23)
CO2: 23 mmol/L (ref 22–32)
Calcium: 7.9 mg/dL — ABNORMAL LOW (ref 8.9–10.3)
Chloride: 101 mmol/L (ref 98–111)
Creatinine, Ser: 0.63 mg/dL (ref 0.44–1.00)
GFR, Estimated: >60 mL/min (ref >60)
Glucose, Bld: 138 mg/dL — ABNORMAL HIGH (ref 70–99)
Potassium: 4.1 mmol/L (ref 3.5–5.1)
Sodium: 134 mmol/L — ABNORMAL LOW (ref 135–145)
Total Bilirubin: 15.7 mg/dL — ABNORMAL HIGH (ref 0.3–1.2)
Total Protein: 6.2 g/dL — ABNORMAL LOW (ref 6.5–8.1)

## 2023-01-12 LAB — GLUCOSE, CAPILLARY
Glucose-Capillary: 128 mg/dL — ABNORMAL HIGH (ref 70–99)
Glucose-Capillary: 120 mg/dL — ABNORMAL HIGH (ref 70–99)
Glucose-Capillary: 152 mg/dL — ABNORMAL HIGH (ref 70–99)
Glucose-Capillary: 144 mg/dL — ABNORMAL HIGH (ref 70–99)
Glucose-Capillary: 129 mg/dL — ABNORMAL HIGH (ref 70–99)

## 2023-01-12 SURGERY — ERCP, WITH INTERVENTION IF INDICATED
Anesthesia: General

## 2023-01-12 MED ORDER — ROCURONIUM BROMIDE 10 MG/ML (PF) SYRINGE
PREFILLED_SYRINGE | INTRAVENOUS | Status: DC | PRN
  Administered 2023-01-12: 40 mg via INTRAVENOUS

## 2023-01-12 MED ORDER — FENTANYL CITRATE (PF) 100 MCG/2ML IJ SOLN
INTRAMUSCULAR | Status: AC
  Filled 2023-01-12: qty 2

## 2023-01-12 MED ORDER — SPOT INK MARKER SYRINGE KIT
PACK | SUBMUCOSAL | Status: AC
  Filled 2023-01-12: qty 5

## 2023-01-12 MED ORDER — ENOXAPARIN SODIUM 60 MG/0.6ML IJ SOSY
50.0000 mg | PREFILLED_SYRINGE | INTRAMUSCULAR | Status: DC
  Administered 2023-01-13 – 2023-01-14 (×2): 50 mg via SUBCUTANEOUS
  Filled 2023-01-12 (×2): qty 0.6

## 2023-01-12 MED ORDER — LACTATED RINGERS IV SOLN
INTRAVENOUS | Status: AC | PRN
  Administered 2023-01-12: 20 mL/h via INTRAVENOUS

## 2023-01-12 MED ORDER — PHENYLEPHRINE 80 MCG/ML (10ML) SYRINGE FOR IV PUSH (FOR BLOOD PRESSURE SUPPORT)
PREFILLED_SYRINGE | INTRAVENOUS | Status: DC | PRN
  Administered 2023-01-12 (×2): 160 ug via INTRAVENOUS

## 2023-01-12 MED ORDER — ONDANSETRON HCL 4 MG/2ML IJ SOLN
4.0000 mg | Freq: Once | INTRAMUSCULAR | Status: AC | PRN
  Administered 2023-01-16: 4 mg via INTRAVENOUS

## 2023-01-12 MED ORDER — SUCCINYLCHOLINE CHLORIDE 200 MG/10ML IV SOSY
PREFILLED_SYRINGE | INTRAVENOUS | Status: DC | PRN
  Administered 2023-01-12: 100 mg via INTRAVENOUS

## 2023-01-12 MED ORDER — PHENYLEPHRINE HCL (PRESSORS) 10 MG/ML IV SOLN
INTRAVENOUS | Status: AC
  Filled 2023-01-12: qty 1

## 2023-01-12 MED ORDER — CIPROFLOXACIN IN D5W 400 MG/200ML IV SOLN
INTRAVENOUS | Status: DC | PRN
  Administered 2023-01-12: 400 mg via INTRAVENOUS

## 2023-01-12 MED ORDER — ACETAMINOPHEN 160 MG/5ML PO SOLN: 325.0000 mg | ORAL | Status: DC | PRN

## 2023-01-12 MED ORDER — DEXAMETHASONE SODIUM PHOSPHATE 10 MG/ML IJ SOLN
INTRAMUSCULAR | Status: DC | PRN
  Administered 2023-01-12: 4 mg via INTRAVENOUS

## 2023-01-12 MED ORDER — SIMETHICONE 80 MG PO CHEW
80.0000 mg | CHEWABLE_TABLET | Freq: Four times a day (QID) | ORAL | Status: DC
  Administered 2023-01-12: 80 mg via ORAL
  Filled 2023-01-12 (×4): qty 1

## 2023-01-12 MED ORDER — OXYCODONE HCL 5 MG PO TABS: 5.0000 mg | ORAL_TABLET | Freq: Once | ORAL | Status: DC | PRN

## 2023-01-12 MED ORDER — FENTANYL CITRATE (PF) 100 MCG/2ML IJ SOLN
INTRAMUSCULAR | Status: DC | PRN
  Administered 2023-01-12 (×2): 50 ug via INTRAVENOUS

## 2023-01-12 MED ORDER — PROPOFOL 10 MG/ML IV BOLUS
INTRAVENOUS | Status: DC | PRN
  Administered 2023-01-12: 130 mg via INTRAVENOUS

## 2023-01-12 MED ORDER — ONDANSETRON HCL 4 MG/2ML IJ SOLN
INTRAMUSCULAR | Status: DC | PRN
  Administered 2023-01-12: 4 mg via INTRAVENOUS

## 2023-01-12 MED ORDER — GLUCAGON HCL RDNA (DIAGNOSTIC) 1 MG IJ SOLR
INTRAMUSCULAR | Status: DC | PRN
  Administered 2023-01-12 (×5): .25 mg via INTRAVENOUS

## 2023-01-12 MED ORDER — GLUCAGON HCL RDNA (DIAGNOSTIC) 1 MG IJ SOLR
INTRAMUSCULAR | Status: AC
  Filled 2023-01-12: qty 1

## 2023-01-12 MED ORDER — DICLOFENAC SUPPOSITORY 100 MG
RECTAL | Status: DC | PRN
  Administered 2023-01-12: 100 mg via RECTAL

## 2023-01-12 MED ORDER — CIPROFLOXACIN IN D5W 400 MG/200ML IV SOLN
INTRAVENOUS | Status: AC
  Filled 2023-01-12: qty 200

## 2023-01-12 MED ORDER — PHENYLEPHRINE HCL-NACL 20-0.9 MG/250ML-% IV SOLN
INTRAVENOUS | Status: DC | PRN
  Administered 2023-01-12: 50 ug/min via INTRAVENOUS

## 2023-01-12 MED ORDER — SUGAMMADEX SODIUM 200 MG/2ML IV SOLN
INTRAVENOUS | Status: DC | PRN
  Administered 2023-01-12: 200 mg via INTRAVENOUS

## 2023-01-12 MED ORDER — OXYCODONE HCL 5 MG/5ML PO SOLN: 5.0000 mg | Freq: Once | ORAL | Status: DC | PRN

## 2023-01-12 MED ORDER — SODIUM CHLORIDE 0.9 % IV SOLN
INTRAVENOUS | Status: DC | PRN
  Administered 2023-01-12: 50 mL

## 2023-01-12 MED ORDER — DICLOFENAC SUPPOSITORY 100 MG
RECTAL | Status: AC
  Filled 2023-01-12: qty 1

## 2023-01-12 MED ORDER — PROPOFOL 10 MG/ML IV BOLUS
INTRAVENOUS | Status: AC
  Filled 2023-01-12: qty 20

## 2023-01-12 MED ORDER — MEPERIDINE HCL 50 MG/ML IJ SOLN: 6.2500 mg | INTRAMUSCULAR | Status: DC | PRN

## 2023-01-12 MED ORDER — LIDOCAINE 2% (20 MG/ML) 5 ML SYRINGE
INTRAMUSCULAR | Status: DC | PRN
  Administered 2023-01-12: 80 mg via INTRAVENOUS

## 2023-01-12 MED ORDER — FENTANYL CITRATE (PF) 100 MCG/2ML IJ SOLN: 25.0000 ug | INTRAMUSCULAR | Status: DC | PRN

## 2023-01-12 MED ORDER — ACETAMINOPHEN 325 MG PO TABS: 325.0000 mg | ORAL_TABLET | ORAL | Status: DC | PRN

## 2023-01-12 MED ORDER — SPOT INK MARKER SYRINGE KIT
PACK | SUBMUCOSAL | Status: DC | PRN
  Administered 2023-01-12: 2.5 mL via SUBMUCOSAL

## 2023-01-12 NOTE — Anesthesia Preprocedure Evaluation (Addendum)
Anesthesia Evaluation  Patient identified by MRN, date of birth, ID band Patient awake    Reviewed: Allergy & Precautions, NPO status , Patient's Chart, lab work & pertinent test results  Airway Mallampati: III  TM Distance: >3 FB Neck ROM: Full    Dental no notable dental hx. (+) Dental Advisory Given   Pulmonary former smoker   Pulmonary exam normal        Cardiovascular hypertension, Pt. on medications Normal cardiovascular exam     Neuro/Psych  PSYCHIATRIC DISORDERS  Depression     Neuromuscular disease    GI/Hepatic negative GI ROS,,,(+) Hepatitis -  Endo/Other  diabetes, Insulin Dependent  Morbid obesity  Renal/GU negative Renal ROS     Musculoskeletal negative musculoskeletal ROS (+)    Abdominal  (+) + obese  Peds  Hematology negative hematology ROS (+)   Anesthesia Other Findings hyperbilirubinemia, abnormal MRCP with ductal dilation  Reproductive/Obstetrics                             Anesthesia Physical Anesthesia Plan  ASA: 3 and emergent  Anesthesia Plan: General   Post-op Pain Management:    Induction: Intravenous  PONV Risk Score and Plan: 3 and Ondansetron, Dexamethasone, Treatment may vary due to age or medical condition and Propofol infusion  Airway Management Planned: Oral ETT  Additional Equipment: None  Intra-op Plan:   Post-operative Plan: Extubation in OR  Informed Consent: I have reviewed the patients History and Physical, chart, labs and discussed the procedure including the risks, benefits and alternatives for the proposed anesthesia with the patient or authorized representative who has indicated his/her understanding and acceptance.     Dental advisory given  Plan Discussed with: CRNA and Anesthesiologist  Anesthesia Plan Comments:        Anesthesia Quick Evaluation

## 2023-01-12 NOTE — Transfer of Care (Signed)
Immediate Anesthesia Transfer of Care Note  Patient: Regina Mckay  Procedure(s) Performed: ENDOSCOPIC RETROGRADE CHOLANGIOPANCREATOGRAPHY (ERCP) STENT REMOVAL REMOVAL OF SLUDGE BILIARY BRUSHING BILIARY DILATION BILIARY STENT PLACEMENT BIOPSY SUBMUCOSAL TATTOO INJECTION HEMOSTASIS CLIP PLACEMENT  Patient Location: PACU  Anesthesia Type:General  Level of Consciousness: awake, alert , and patient cooperative  Airway & Oxygen Therapy: Patient Spontanous Breathing and Patient connected to face mask oxygen  Post-op Assessment: Report given to RN, Post -op Vital signs reviewed and stable, and Patient moving all extremities X 4  Post vital signs: Reviewed and stable  Last Vitals:  Vitals Value Taken Time  BP 139/67 01/12/23 1700  Temp 37.1 C 01/12/23 1654  Pulse 73 01/12/23 1712  Resp 19 01/12/23 1712  SpO2 96 % 01/12/23 1712  Vitals shown include unvalidated device data.  Last Pain:  Vitals:   01/12/23 1700  TempSrc:   PainSc: Asleep      Patients Stated Pain Goal: 0 (01/09/23 2010)  Complications: No notable events documented.

## 2023-01-12 NOTE — Progress Notes (Signed)
[facility] Progress Note  Patient: Regina Mckay     NFA:[id number]  DOA: 01/07/2023   PCP: [name], NP       Brief hospital course: This is a 72 year old female with a history of meningioma, hypertension, diabetes mellitus, severe esophagitis & erosive gastritis, recent Klebsiella bacteremia in May.  Recent imaging reveals liver lesions suspicious for mets, retroperitoneal lymphadenopathy, pulmonary nodules, supraclavicular and mediastinal lymphadenopathy and thyroid nodules.  Has had an EUS with biopy that was inconclusive. No CBD dilatation noted on imaging and EUS but T bili rising. She was seen by oncology on 6/5 and noted to have an increasing bilirubin level. She has been admitted for hyperbilirubinemia, nausea,and severe weakness.  Subjective:  Ongoing indigestion and belching.   Assessment and Plan: Principal Problem:   Hyperbilirubinemia- Liver masses -Hyperbilirubinemia likely secondary to liver masses which appear to be metastasis - Biopsies of liver negative -EUS was performed on 5/30 and fine-needle aspiration of lymph nodes and biopsy of gastric mucosa were negative for malignancy - Patient also has bulky abdominal adenopathy, lung nodules and thyroid nodules -CEA 18.9 - ERCP completed 6/7. 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 placed in the left hepatic duct - T bili is13.5> 17.2>  15.7- plan for ERCP and stent of right hepatic duct  Active Problems: History of hepatitis - Most likely hepatitis C as hep C antibodies noted to be positive - the patient states hepatitis was previously treated - HCV RNA ordered and is not detected -AFP 10.2 with the upper limit of normal being 9.2   Esophagitis and erosive gastritis - Continue PPI - Although the patient was discharged with a prescription for sucralfate, she states that she never received it from the pharmacy - Sucralfate started in the hospital-she states this has helped her  significantly - added Pepcid on 6/7- indigestion continues and oral intake is poor  Elevated Cr/ AKI - Cr .80> 1.20 with poor intake - IVF started and improved to 0.49 - stop IVF today and continue to encourage oral intake  History of recent bacteremia due to Klebsiella pneumoniae - Finished treatment with Augmentin -completed a total of 14 days of antibiotics    Class 3 severe obesity with serious comorbidity and body mass index (BMI) of 40.0 to 44.9 in adult (HCC) Body mass index is 39.44 kg/m.    DM (diabetes mellitus), type 2 with ophthalmic complications (HCC) - Carb modified diet, Semglee and NovoLog sliding scale ordered  Hypertension Continue amlodipine and lisinopril  Thyroid nodules - noted to have a multinodular goiter on [place] on 5/28- repeat US recommended in 1 yr - last TSH 1.148      Code Status: Full Code Consultants: GI Level of Care: Level of care: Med-Surg Total time on patient care: 30 minutes DVT prophylaxis:  SCDs Start: 01/07/23 1354     Objective:   Vitals:   01/11/23 0556 01/11/23 1413 01/11/23 2045 01/12/23 0447  BP: (!) 140/59 (!) 155/71 (!) 127/94 132/68  Pulse: 78 83 80 84  Resp: 18 15 18 18   Temp: 97.6 F (36.4 C) 98.5 F (36.9 C) 98.7 F (37.1 C) 98.9 F (37.2 C)  TempSrc: Oral  Oral Oral  SpO2: 96% 96% 98% 96%  Weight:      Height:       Filed Weights   01/07/23 1329  Weight: 101 kg   Exam: General exam: Appears comfortable  HEENT: oral mucosa moist Respiratory system: Clear to auscultation.  Cardiovascular system: S1 & S2 heard  Gastrointestinal system: Abdomen soft, non-tender, nondistended. Normal bowel sounds   Extremities: No cyanosis, clubbing or edema Psychiatry:  flat affect     CBC: Recent Labs  Lab 01/07/23 0954 01/08/23 0554 01/10/23 0649 01/11/23 0631  WBC 7.5 6.6 7.2 8.7  NEUTROABS 5.5  --  5.7 7.3  HGB 14.0 12.5 12.7 12.7  HCT 40.2 36.8 37.8 37.6  MCV 84.8 87.0 87.7 87.0  PLT 230 209 203 198     Basic Metabolic Panel: Recent Labs  Lab 01/09/23 1958 01/10/23 0649 01/10/23 1427 01/11/23 1112 01/12/23 0632  NA 134* 131* 134* 133* 134*  K 4.2 4.0 4.2 3.9 4.1  CL 98 94* 97* 100 101  CO2 24 24 22 26 23   GLUCOSE 124* 193* 167* 168* 138*  BUN 12 16 15 11  7*  CREATININE 0.80 1.20* 0.77 0.49 0.63  CALCIUM 8.3* 8.3* 8.6* 7.9* 7.9*    GFR: Estimated Creatinine Clearance: 71 mL/min (by C-G formula based on SCr of 0.63 mg/dL).  Scheduled Meds:  acidophilus  1 capsule Oral Daily   amLODipine  10 mg Oral Daily   enoxaparin (LOVENOX) injection  50 mg Subcutaneous Q24H   famotidine  20 mg Oral BID   indomethacin  100 mg Rectal Once   insulin aspart  0-15 Units Subcutaneous TID WC   insulin aspart  0-5 Units Subcutaneous QHS   insulin glargine-yfgn  10 Units Subcutaneous Daily   lisinopril  20 mg Oral Daily   pantoprazole  40 mg Oral BID AC   Ensure Max Protein  11 oz Oral BID   simethicone  80 mg Oral QID   sucralfate  1 g Oral TID WC & HS   Continuous Infusions:  ciprofloxacin 400 mg (01/12/23 0501)   Imaging and lab data was personally reviewed No results found.  LOS: 5 days   Author: Calvert Cantor  01/12/2023 2:03 PM  To contact [facility]>   Check the care team in [facility] and look for the attending/consulting [facility] provider listed  Log into [email or web address] and use [facility] universal password   Go to> "[facility]"  and find provider  If you still have difficulty reaching the provider, please page the [facility] (Director on Call) for the Hospitalists listed on amion

## 2023-01-12 NOTE — Anesthesia Procedure Notes (Signed)
Procedure Name: Intubation Date/Time: 01/12/2023 3:00 PM  Performed by: Nelle Don, CRNAPre-anesthesia Checklist: Patient identified, Emergency Drugs available, Suction available and Patient being monitored Patient Re-evaluated:Patient Re-evaluated prior to induction Oxygen Delivery Method: Circle system utilized Preoxygenation: Pre-oxygenation with 100% oxygen Induction Type: IV induction, Rapid sequence and Cricoid Pressure applied Laryngoscope Size: Mac and 4 Grade View: Grade II Tube type: Oral Tube size: 7.0 mm Number of attempts: 1 Airway Equipment and Method: Stylet Placement Confirmation: ETT inserted through vocal cords under direct vision, positive ETCO2 and breath sounds checked- equal and bilateral Secured at: 22 cm Tube secured with: Tape Dental Injury: Teeth and Oropharynx as per pre-operative assessment

## 2023-01-12 NOTE — Progress Notes (Addendum)
Progress Note  Primary GI: Unassigned (Seen by Dr. 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 Provided some of the history.  Patient reports being n.p.o. since midnight and having some nausea.  Burping frequently.  Denies abdominal pain.  Last time she went without eating she also had nausea so suspect it may be related.   Objective   Vital signs in last 24 hours: Temp:  [98.5 F (36.9 C)-98.9 F (37.2 C)] 98.9 F (37.2 C) (06/10 0447) Pulse Rate:  [80-84] 84 (06/10 0447) Resp:  [15-18] 18 (06/10 0447) BP: (127-155)/(68-94) 132/68 (06/10 0447) SpO2:  [96 %-98 %] 96 % (06/10 0447) Last BM Date : 01/10/23 Last BM recorded by nurses in past 5 days Stool Type: Type 6 (Mushy consistency with ragged edges); Type 7 (Liquid consistency with no solid pieces) (01/08/2023  5:52 AM)  General:   female in no acute distress, scleral icterus Heart:  Regular rate and rhythm; no murmurs Pulm: Clear anteriorly; no wheezing Abdomen: soft, nondistended, normal bowel sounds in all quadrants. Nontender without guarding. No organomegaly appreciated. Extremities:  No edema Neurologic:  Alert and  oriented x4;  No focal deficits.  Psych:  Cooperative. Normal mood and affect.  Intake/Output from previous day: 06/09 0701 - 06/10 0700 In: 480 [P.O.:480] Out: -  Intake/Output this shift: Total I/O In: -  Out: 800 [Urine:800]  Studies/Results: No results found.  Lab Results: Recent Labs    01/10/23 0649 01/11/23 0631  WBC 7.2 8.7  HGB 12.7 12.7  HCT 37.8 37.6  PLT 203 198   BMET Recent Labs    01/10/23 1427 01/11/23 1112 01/12/23 0632  NA 134* 133* 134*  K 4.2 3.9 4.1  CL 97* 100 101  CO2 22 26 23   GLUCOSE 167* 168* 138*  BUN 15 11 7*  CREATININE 0.77 0.49 0.63  CALCIUM 8.6* 7.9* 7.9*   LFT Recent Labs    01/09/23 1957 01/09/23 1958 01/12/23 0632   PROT 6.3*   < > 6.2*  ALBUMIN 2.3*   < > 2.1*  AST 58*   < > 49*  ALT 34   < > 30  ALKPHOS 125   < > 117  BILITOT 15.4*   < > 15.7*  BILIDIR 9.1*  --   --   IBILI 6.3*  --   --    < > = values in this interval not displayed.   PT/INR No results for input(s): "LABPROT", "INR" in the last 72 hours.   Scheduled Meds:  acidophilus  1 capsule Oral Daily   amLODipine  10 mg Oral Daily   enoxaparin (LOVENOX) injection  50 mg Subcutaneous Q24H   famotidine  20 mg Oral BID   indomethacin  100 mg Rectal Once   insulin aspart  0-15 Units Subcutaneous TID WC   insulin aspart  0-5 Units Subcutaneous QHS   insulin glargine-yfgn  10 Units Subcutaneous Daily   lisinopril  20 mg Oral Daily   pantoprazole  40 mg Oral BID AC   Ensure Max Protein  11 oz Oral BID   sucralfate  1 g Oral TID WC & HS   Continuous Infusions:  ciprofloxacin 400 mg (01/12/23 0501)      Patient profile:   72 year old female with 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    -  Liver biopsy nondiagnostic for malignant cells -EUS with FNA of peripancreatic and portal lns negative    REPEAT MRCP 6/5: Liver mets.  Mild intrahepatic bile duct dilation with termination of converging intrahepatic bile loops suggesting underlying central obstructing lesion versus mass effect from enlarged porta hepatic lymph nodes.  Gallstones with gallbladder wall thickening.  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 Could be due to extrinsic compression. S/p brushings. S/p insertion of left hepatic duct stent. Biopsies pending -HCV Ab reactive, history of HCV s/p Harvoni  in SVR. -CEA 18.9 -AFP 10.2 -CA 19-9 33,659 CA 19-9 elevation concerning for cholangiocarcinoma. Still awaiting brushing results.     Plan:   - Keep NPO - Planning to try to do ERCP later today with Dr. Meridee Score for attempt at stenting right system to improve bilirubinemia. Pending anesthesia availability - if stenting is unsuccessful or unable to proceed with ERCP, will considering involving IR for consideration of obtaining right PBD and liver biopsy sampling - continue supportive care - can have anti-emetics for nausea (sips with meds). - GI will continue to follow.  Ben Habermann Leanna Sato  01/12/2023, 8:47 AM

## 2023-01-12 NOTE — Anesthesia Postprocedure Evaluation (Signed)
Anesthesia Post Note  Patient: Regina Mckay  Procedure(s) Performed: ENDOSCOPIC RETROGRADE CHOLANGIOPANCREATOGRAPHY (ERCP) STENT REMOVAL REMOVAL OF SLUDGE BILIARY BRUSHING BILIARY DILATION BILIARY STENT PLACEMENT BIOPSY SUBMUCOSAL TATTOO INJECTION HEMOSTASIS CLIP PLACEMENT     Patient location during evaluation: PACU Anesthesia Type: General Level of consciousness: awake and alert Pain management: pain level controlled Vital Signs Assessment: post-procedure vital signs reviewed and stable Respiratory status: spontaneous breathing, nonlabored ventilation, respiratory function stable and patient connected to nasal cannula oxygen Cardiovascular status: blood pressure returned to baseline and stable Postop Assessment: no apparent nausea or vomiting Anesthetic complications: no   No notable events documented.  Last Vitals:  Vitals:   01/12/23 1730 01/12/23 1735  BP: 125/69   Pulse: 73 75  Resp: 17 18  Temp:    SpO2: 97% 95%    Last Pain:  Vitals:   01/12/23 1730  TempSrc:   PainSc: 0-No pain                 [name]

## 2023-01-12 NOTE — Op Note (Addendum)
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 Estimated Blood Loss:     Estimated blood loss was minimal. Procedure:                Pre-Anesthesia Assessment:                           - Prior to the procedure, a History and Physical                            was performed, and patient medications and                            allergies were reviewed. The patient's tolerance of                            previous anesthesia was also reviewed. The risks                            and benefits of the procedure and the sedation                            options and risks were discussed with the patient.                            All questions were answered, and informed consent                            was obtained. Prior Anticoagulants: The patient has  taken no anticoagulant or antiplatelet agents. ASA                            Grade Assessment: III - A patient with severe                            systemic disease. After reviewing the risks and                            benefits, the patient was deemed in satisfactory                            condition to undergo the procedure.                            After obtaining informed consent, the scope was                            passed under direct vision. Throughout the                            procedure, the patient's blood pressure, pulse, and                            oxygen saturations were monitored continuously. The                            [id number]-Q190V ([id number]) Olympus duodenoscope was                            introduced through the mouth, and used to inject                            contrast into and used to inject contrast into the                            bile duct. The ERCP was technically difficult and                            complex due to difficulty passing guidewires                            through biliary ductal stenosis. Successful                            completion of the procedure was aided by performing                            the maneuvers documented (below) in this report.                            The patient tolerated the procedure. The PCF-HQ190L                            (  [id number]) Olympus colonoscope was introduced                            through the mouth, and used to inject contrast into                            and used to locate the major papilla. Scope In: Scope Out: Findings:      A biliary stent was visible on the scout film.      The esophagus was successfully intubated under direct vision without       detailed examination of the pharynx, larynx, and associated structures       with the duodenoscope. A biliary sphincterotomy had been performed. The       sphincterotomy appeared open. One plastic biliary stent originating in       the biliary tree was emerging from the major papilla. The stent was       visibly patent. One stent was removed from the biliary tree using a       snare and sent for cytology.      A short 0.035 inch Soft Jagwire was passed into the biliary tree and       looked to be in the right biliary system. A second short 0.035 inch Soft        Jagwire was passed into the biliary tree and looked to be in the left       biliary system. The Hydratome sphincterotome was passed over the       guidewire and the bile duct was then deeply cannulated. Contrast was       injected. I personally interpreted the bile duct images. Opacification       of the entire biliary tree except for the gallbladder was successful.       The left and right hepatic ducts with secondary or tertiary branches of       the intrahepatic ducts (Bismuth IV) contained multiple segmental       stenoses (within the left hepatic duct just proximal to the bifurcation,       in the right hepatic duct even more proximal to the bifurcation, and       what appeared to be at the bifurcation/CHD region). The left main       hepatic duct and right main hepatic duct were moderately dilated,       acquired from the stenosis. The largest diameter was 9 mm. The biliary       tree was swept with a retrieval balloon starting distally and then       moving proximally into both the left and right systems. Sludge and pus       and what appeared to be tissue debris were swept from the duct (the       tissue debris was suctioned via endoscope to be sent for pathology). An       occlusion cholangiogram was performed that showed the aforementioned       narrowing/strictures with biliary dilation proximally. The common bile       duct itself measured approximately 4 to 5 mm and was quite small. The       biliary strictures were dilated with a HurriCaine 4 mm x 2 cm balloon       sequentially. Cells for cytology were obtained by brushing the  strictures in the hepatic duct bifurcation, the left main hepatic duct       and the right main hepatic duct. Decision was made to attempt biliary       stent placement with the hope of trying to access and placed 2 stents       even with the small CBD diameter. As the patient had not had improvement       with a stent in the presumed left system,  I went after the right system       first. One 8.5 Fr by 12 cm transpapillary plastic biliary stent with a       single external flap and a single internal flap was placed into the       right hepatic duct. The stent was in good position. Subsequently I then       used a 7 Fr by 15 cm transpapillary plastic biliary stent with a single       external flap and a single internal flap was placed into the left       hepatic duct. The stent unfortunately was positioned too far downstream       (towards the lumen of the GI tract) and had to be removed. Looking at       the cholangiogram, suggested that this 7 French stent would not likely       be able to traverse the CHD because of how normal size of the CBD was.       Thus not clear that a shorter stent would have been helpful. Thus we       only left 1 stent into the right system.      A pancreatogram was not performed.      The duodenoscope was removed from the patient.      As I had seen something in the distal D3 area that was concerning for       potential adenoma, I switched over to a pediatric colonoscope. The scope       was passed under direct vision through the upper GI tract. No gross       lesions were noted in the entire esophagus. No gross lesions were noted       in the entire examined stomach. No gross lesions were noted in the       duodenal bulb, in the first portion of the duodenum and in the second       portion of the duodenum. 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 s withdrawn from the patient.       Biopsies were taken of the mass. I subsequently placed a clip proximal       and distal  to the duodenal lesion. I also placed tattoo proximally and       distally.      The pediatric colonoscope was removed from the patient. Impression:               - Prior biliary sphincterotomy appeared open.                           -  One visibly patent stent from the biliary tree                            was seen in the major papilla. Removed and sent for                            cytology.                           - Multiple segmental biliary strictures were found                            in the hepatic duct system (Bismuth IV) Lt/Rt/CHD.                            The strictures were indeterminate. These were                            dilated with HurriCaine balloon.                           - The left main hepatic duct and right main hepatic                            duct were moderately dilated, acquired.                           - One stent was removed from the biliary tree.                           - The biliary tree was swept and sludge, pus and                            debris were found.                           - Cells for cytology obtained at the hepatic duct                            bifurcation, in the left main hepatic duct and in                            the right main hepatic duct strictures as noted                            above.                           - One plastic biliary stent was placed into the                            right hepatic duct.                           -  One plastic biliary stent was placed into the                            left hepatic duct but too far downstream and could                            not traverse the normal sized CBD/CHD, so could not                            be replaced with 2 separate stents.                           - No gross lesions in the entire esophagus.                           - No gross lesions in the entire stomach.                           - No gross lesions in the duodenal bulb, in the                             first portion of the duodenum and in the second                            portion of the duodenum.                           - Mass (suspected adenoma - though could be more)                            in the third/fourth portion of the duodenum.                            Biopsied. Tattoo placed proximally and distally.                            Clips placed proximally and distally. Moderate Sedation:      Not Applicable - Patient had care per Anesthesia. Recommendation:           - The patient will be observed post-procedure,                            until all discharge criteria are met.                           - Return patient to hospital ward for ongoing care.                           - Trend LFT pattern over the next couple of days.                            With the hope that the biochemical testing improves  with a biliary stent in the right system.                           - Patient will need a liver biopsy to evaluate the                            right-sided liver lesions (these have previously                            been biopsied and negative by interventional                            radiology but this case has been discussed with the                            necessity of trying to get more tissue).                           - Observe patient's clinical course.                           - Recommend continuing antibiotics in the setting                            of the cholangitis that was found today                            (ciprofloxacin twice daily as is prescribed is                            reasonable unless blood cultures or other data                            suggest need for up titration of antibiotics) for                            potentially a 7-day course since this is hilar                            disease.                           - Watch for pancreatitis, bleeding, perforation,                             and cholangitis.                           - If the patient does not have improvement in                            biliary numbers within the next few days, then  interventional radiology will need to consider the                            role of percutaneous biliary drainage.                           - If patient does well with current stent in the                            right system, can consider transitioning to a                            permanent stent in the coming months.                           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 Defer to current                            team to still consider role of colonoscopy or not                            due to elevated CEA.                           - The findings and recommendations were discussed                            with the patient.                           - The findings and recommendations were discussed                            with the referring physician. Procedure Code(s):        --- Professional ---  16109, Endoscopic retrograde                            cholangiopancreatography (ERCP); with removal and                            exchange of stent(s), biliary or pancreatic duct,                            including pre- and post-dilation and guide wire                            passage, when performed, including sphincterotomy,                            when performed, each stent exchanged                            43276, 59, Endoscopic retrograde                            cholangiopancreatography (ERCP); with removal and                            exchange of stent(s), biliary or pancreatic duct,                            including pre- and post-dilation and guide wire                            passage, when performed, including sphincterotomy,                            when performed, each stent exchanged                           43264, Endoscopic retrograde                            cholangiopancreatography (ERCP); with removal of                            calculi/debris from biliary/pancreatic duct(s)                           43239, Esophagogastroduodenoscopy, flexible,                            transoral; with biopsy, single or multiple                           74328, 26, Endoscopic catheterization of the                            biliary ductal system, radiological supervision and  interpretation Diagnosis Code(s):        --- Professional ---                           Z96.89, Presence of other specified functional                            implants                           K31.89, Other diseases of stomach and duodenum                           K83.1, Obstruction of bile duct                           Z46.59, Encounter for fitting and adjustment of                            other gastrointestinal appliance and device                           K83.8, Other specified diseases of biliary tract CPT copyright 2022 [employer]. All rights reserved. The codes documented in this report are preliminary and upon coder review may  be revised to meet current compliance requirements. Corliss Parish, MD 01/12/2023 5:19:05 PM Number of Addenda: 0

## 2023-01-13 ENCOUNTER — Inpatient Hospital Stay (HOSPITAL_COMMUNITY): Payer: Medicare Other

## 2023-01-13 ENCOUNTER — Telehealth: Payer: Self-pay

## 2023-01-13 DIAGNOSIS — K831 Obstruction of bile duct: Secondary | ICD-10-CM | POA: Diagnosis not present

## 2023-01-13 DIAGNOSIS — R160 Hepatomegaly, not elsewhere classified: Secondary | ICD-10-CM

## 2023-01-13 LAB — COMPREHENSIVE METABOLIC PANEL
ALT: 27 U/L (ref 0–44)
AST: 44 U/L — ABNORMAL HIGH (ref 15–41)
Albumin: 2 g/dL — ABNORMAL LOW (ref 3.5–5.0)
Alkaline Phosphatase: 125 U/L (ref 38–126)
Anion gap: 9 (ref 5–15)
BUN: 11 mg/dL (ref 8–23)
CO2: 23 mmol/L (ref 22–32)
Calcium: 7.9 mg/dL — ABNORMAL LOW (ref 8.9–10.3)
Chloride: 99 mmol/L (ref 98–111)
Creatinine, Ser: 0.73 mg/dL (ref 0.44–1.00)
GFR, Estimated: >60 mL/min (ref >60)
Glucose, Bld: 142 mg/dL — ABNORMAL HIGH (ref 70–99)
Potassium: 3.8 mmol/L (ref 3.5–5.1)
Sodium: 131 mmol/L — ABNORMAL LOW (ref 135–145)
Total Bilirubin: 15.1 mg/dL — ABNORMAL HIGH (ref 0.3–1.2)
Total Protein: 5.7 g/dL — ABNORMAL LOW (ref 6.5–8.1)

## 2023-01-13 LAB — CBC
HCT: 36.3 % (ref 36.0–46.0)
Hemoglobin: 12.2 g/dL (ref 12.0–15.0)
MCH: 29 pg (ref 26.0–34.0)
MCHC: 33.6 g/dL (ref 30.0–36.0)
MCV: 86.4 fL (ref 80.0–100.0)
Platelets: 166 10*3/uL (ref 150–400)
RBC: 4.2 MIL/uL (ref 3.87–5.11)
RDW: 17.4 % — ABNORMAL HIGH (ref 11.5–15.5)
WBC: 7.3 10*3/uL (ref 4.0–10.5)
nRBC: 0 % (ref 0.0–0.2)

## 2023-01-13 LAB — CYTOLOGY - NON PAP

## 2023-01-13 LAB — GLUCOSE, CAPILLARY
Glucose-Capillary: 123 mg/dL — ABNORMAL HIGH (ref 70–99)
Glucose-Capillary: 76 mg/dL (ref 70–99)
Glucose-Capillary: 175 mg/dL — ABNORMAL HIGH (ref 70–99)

## 2023-01-13 MED ORDER — GELATIN ABSORBABLE 12-7 MM EX MISC
CUTANEOUS | Status: AC
  Filled 2023-01-13: qty 1

## 2023-01-13 MED ORDER — FENTANYL CITRATE (PF) 100 MCG/2ML IJ SOLN
INTRAMUSCULAR | Status: AC
  Filled 2023-01-13: qty 2

## 2023-01-13 MED ORDER — MIDAZOLAM HCL 2 MG/2ML IJ SOLN
INTRAMUSCULAR | Status: AC | PRN
  Administered 2023-01-13 (×2): 1 mg via INTRAVENOUS

## 2023-01-13 MED ORDER — LIDOCAINE HCL (PF) 1 % IJ SOLN
INTRAMUSCULAR | Status: AC | PRN
  Administered 2023-01-13: 10 mL

## 2023-01-13 MED ORDER — MIDAZOLAM HCL 2 MG/2ML IJ SOLN
INTRAMUSCULAR | Status: AC
  Filled 2023-01-13: qty 2

## 2023-01-13 MED ORDER — GELATIN ABSORBABLE 12-7 MM EX MISC
CUTANEOUS | Status: AC | PRN
  Administered 2023-01-13: 1

## 2023-01-13 MED ORDER — LIDOCAINE HCL 1 % IJ SOLN
INTRAMUSCULAR | Status: AC
  Filled 2023-01-13: qty 20

## 2023-01-13 MED ORDER — FENTANYL CITRATE (PF) 100 MCG/2ML IJ SOLN
INTRAMUSCULAR | Status: AC | PRN
  Administered 2023-01-13 (×2): 50 ug via INTRAVENOUS

## 2023-01-13 NOTE — TOC Progression Note (Signed)
Transition of Care [facility]) - Progression Note    Patient Details  Name: Regina Mckay MRN: [id number] Date of Birth: November 21, 1950  [facility]) CM/SW Contact  Beckie Busing, RN Phone [phone]  01/13/2023, 9:33 PM  Clinical Narrative:    TOC continues to follow. No needs noted at this time.          Expected Discharge Plan and Services                                               Social Determinants of Health (SDOH) Interventions SDOH Screenings   Food Insecurity: No Food Insecurity (01/07/2023)  Housing: Patient Declined (01/07/2023)  Transportation Needs: No Transportation Needs (01/07/2023)  Utilities: Not At Risk (01/07/2023)  Depression (PHQ2-9): Low Risk  (12/26/2022)  Tobacco Use: Medium Risk (01/12/2023)    Readmission Risk Interventions     No data to display

## 2023-01-13 NOTE — Telephone Encounter (Signed)
-----   Message from [name]., MD sent at 01/12/2023  5:31 PM EDT ----- Regarding: Follow-up biliary stent Regina Mckay, Place recall for ERCP stent exchange for September/October. Thanks. GM

## 2023-01-13 NOTE — Sedation Documentation (Signed)
Sample #3 obtained 

## 2023-01-13 NOTE — Sedation Documentation (Addendum)
Sample #4 obtained 

## 2023-01-13 NOTE — Progress Notes (Signed)
Referring Physician(s): Feng,Yan  Supervising Physician: Roanna Banning  Patient Status:  [facility] - In-pt  Chief Complaint:  Nausea, jaundice, liver mass  Subjective: Pt known to IR team from liver mass bx on 12/22/22. She is a 72 yo female with hx DM, HTN, hep C antibody reactive and hyperbilirubinemia. Previous liver bx was non diagnostic for malignant cells. Recent EUS with FNA peripancreatic /portal LN neg as well. Latest MRI abd on 01/07/23 revealed:  1. Again noted are multifocal hypoenhancing liver lesions suspicious for metastatic disease. The dominant lesion within the lateral dome of liver is unchanged from previous exam. 2. Mild intrahepatic bile duct dilatation with abrupt termination of the converging intrahepatic bile loops centrally suggesting underlying central obstructing lesion versus mass effect from enlarged portahepatic lymph nodes. The common bile duct appears nondilated. Consider further evaluation with ERCP. 3. Gallstones with mild gallbladder wall thickening. 4. Enlarged lymph nodes within the hepatic duodenal ligament and retroperitoneum are again noted. Concerning for nodal metastasis. 5. Mild perihepatic ascites and perisplenic ascites.   Pt has had placement of rt/left hepatic duct stents via recent ERCP. T bili remains elevated at 15.1. Request received from GI for rt liver lesion bx.  Pt denies fever,HA,CP,dyspnea, abd/back pain,vomiting or bleeding.    Past Medical History:  Diagnosis Date   Diabetes mellitus without complication (HCC)    Hypertension    Past Surgical History:  Procedure Laterality Date   APPLICATION OF CRANIAL NAVIGATION N/A 10/11/2020   Procedure: APPLICATION OF CRANIAL NAVIGATION;  Surgeon: Bedelia Person, MD;  Location: [facility] OR;  Service: Neurosurgery;  Laterality: N/A;   BIOPSY  01/01/2023   Procedure: BIOPSY;  Surgeon: [name]., MD;  Location: [facility];  Service: Gastroenterology;;   Monica Becton N/A  10/11/2020   Procedure: CRANIOTOMY FOR RESECTION OF MENINGIOMA;  Surgeon: Bedelia Person, MD;  Location: [facility] OR;  Service: Neurosurgery;  Laterality: N/A;   ESOPHAGOGASTRODUODENOSCOPY (EGD) WITH PROPOFOL N/A 01/01/2023   Procedure: ESOPHAGOGASTRODUODENOSCOPY (EGD) WITH PROPOFOL;  Surgeon: Meridee Score Netty Starring., MD;  Location: [facility] ENDOSCOPY;  Service: Gastroenterology;  Laterality: N/A;   EUS N/A 01/01/2023   Procedure: UPPER ENDOSCOPIC ULTRASOUND (EUS) LINEAR;  Surgeon: [name]., MD;  Location: [facility] ENDOSCOPY;  Service: Gastroenterology;  Laterality: N/A;   FINE NEEDLE ASPIRATION N/A 01/01/2023   Procedure: FINE NEEDLE ASPIRATION (FNA) LINEAR;  Surgeon: [name]., MD;  Location: [facility] ENDOSCOPY;  Service: Gastroenterology;  Laterality: N/A;   REPLACEMENT TOTAL KNEE     TONSILLECTOMY          Allergies: Augmentin [amoxicillin-pot clavulanate] and Lidoderm [lidocaine]  Medications: Prior to Admission medications   Medication Sig Start Date End Date Taking? Authorizing Provider  amLODipine (NORVASC) 10 MG tablet Take 1 tablet (10 mg total) by mouth daily. Follow-up appt due in July must see provider for future refills Patient taking differently: Take 10 mg by mouth daily. 11/06/22  Yes [name], NP  bismuth subsalicylate (PEPTO BISMOL) 262 MG chewable tablet Chew 524 mg by mouth as needed for indigestion or diarrhea or loose stools.   Yes [provider]  cholecalciferol (VITAMIN D3) 10 MCG (400 UNIT) TABS tablet Take 400 Units by mouth daily.   Yes [provider]  Continuous Blood Gluc Sensor (FREESTYLE LIBRE 2 SENSOR) MISC USE 1 SENSOR EVERY 14 DAYS AS DIRECTED Patient taking differently: Inject 1 Device into the skin every 14 (fourteen) days. 09/30/22  Yes Carlus Pavlov, MD  hydrochlorothiazide (MICROZIDE) 12.5 MG capsule Take 1  capsule (12.5 mg total) by mouth daily. 05/28/22  Yes [name], NP  insulin aspart (NOVOLOG FLEXPEN) 100  UNIT/ML FlexPen Inject 5-7 units under skin up to 2x a day Patient taking differently: Inject 7-9 Units into the skin See admin instructions. Inject 7-9 into the skin twice a day 15 minutes before meals 05/06/22  Yes Carlus Pavlov, MD  Insulin Glargine (BASAGLAR KWIKPEN) 100 UNIT/ML Inject 22 Units into the skin daily. Patient taking differently: Inject 22 Units into the skin in the morning. 05/06/22  Yes Carlus Pavlov, MD  lisinopril (ZESTRIL) 20 MG tablet Take 1 tablet (20 mg total) by mouth daily. Follow-up appt due in July Patient taking differently: Take 20 mg by mouth daily. 11/17/22  Yes [name], NP  ondansetron (ZOFRAN) 4 MG tablet Take 1 tablet (4 mg total) by mouth every 8 (eight) hours as needed for nausea or vomiting. 01/02/23  Yes Rai, Ripudeep K, MD  pantoprazole (PROTONIX) 40 MG tablet Take 1 tablet (40 mg total) by mouth 2 (two) times daily before a meal. 01/02/23  Yes Rai, Ripudeep K, MD  SYSTANE HYDRATION PF 0.4-0.3 % SOLN Place 1 drop into both eyes 3 (three) times daily as needed (for dryness or irritation).   Yes [provider]  TYLENOL 8 HOUR ARTHRITIS PAIN 650 MG CR tablet Take 1,300 mg by mouth every 8 (eight) hours as needed for pain.   Yes [provider]  amoxicillin-clavulanate (AUGMENTIN) 875-125 MG tablet Take 1 tablet by mouth 2 (two) times daily for 14 days. Continue for 4 more days, then stop Patient not taking: Reported on 01/07/2023 01/02/23 01/16/23  Rai, Delene Ruffini, MD  Continuous Blood Gluc Receiver (FREESTYLE LIBRE 2 READER) DEVI 1 each by Does not apply route daily. 10/25/21   Carlus Pavlov, MD  Insulin Pen Needle 32G X 4 MM MISC Use 4x a day Patient taking differently: 1 each by Other route See admin instructions. Use 4x a day 09/13/21   Carlus Pavlov, MD  oxyCODONE (OXY IR/ROXICODONE) 5 MG immediate release tablet Take 0.5-1 tablets (2.5-5 mg total) by mouth every 4 (four) hours as needed for moderate pain. Patient not taking:  Reported on 01/07/2023 12/24/22   Leatha Gilding, MD  Semaglutide,0.25 or 0.5MG /DOS, (OZEMPIC, 0.25 OR 0.5 MG/DOSE,) 2 MG/3ML SOPN Inject 0.25 mg into the skin once a week. Patient not taking: Reported on 01/07/2023 01/05/23   Carlus Pavlov, MD  sucralfate (CARAFATE) 1 g tablet Take 1 tablet (1 g total) by mouth 4 (four) times daily -  with meals and at bedtime. Patient not taking: Reported on 01/07/2023 01/07/23 03/08/23  Pollyann Samples, NP     Vital Signs: BP (!) 150/71 (BP Location: Left Arm)   Pulse 71   Temp 97.6 F (36.4 C) (Oral)   Resp 16   Ht 5\' 3"  (1.6 m)   Wt 222 lb 10.6 oz (101 kg)   SpO2 100%   BMI 39.44 kg/m   Physical Exam awake/alert; scleral icterus; chest- CTA bilat; heart- RRR; abd- obese, soft,+BS,NT; no LE edema  Imaging: DG ERCP  Result Date: 01/13/2023 CLINICAL DATA:  [id number] Surgery, elective [id number] EXAM: ERCP COMPARISON:  ERCP 01/09/2023. CT AP, 12/20/2022. MRCP, 12/31/2022 and 01/07/2023. IR ultrasound, 12/23/2022. FLUOROSCOPY: Exposure Index (as provided by the fluoroscopic device): 429 mGy Kerma FINDINGS: Limited oblique planar images of the RIGHT upper quadrant obtained C-arm. Images demonstrating flexible endoscopy, biliary duct cannulation, sphincterotomy, retrograde cholangiogram, balloon sweep and plastic biliary stent placement. No  biliary ductal dilation. No evidence of biliary filling defect is demonstrated. IMPRESSION: Fluoroscopic imaging for ERCP and biliary stent placement. For complete description of intra procedural findings, please see performing service dictation. Electronically Signed   By: Roanna Banning M.D.   On: 01/13/2023 08:02   DG C-Arm 1-60 Min-No Report  Result Date: 01/12/2023 Fluoroscopy was utilized by the requesting physician.  No radiographic interpretation.   DG ERCP  Result Date: 01/09/2023 CLINICAL DATA:  ERCP for abnormal MRCP. EXAM: DG C-ARM 1-60 MIN; ERCP CONTRAST:  Please see operative report. FLUOROSCOPY: Fluoroscopy Time:  7  minutes 27 seconds Radiation Exposure Index (if provided by the fluoroscopic device): 230.61 mGy Number of Acquired Spot Images: 20 COMPARISON:  01/07/2023. FINDINGS: Fluoroscopy was utilized for ERCP. Please see operative report for additional information. IMPRESSION: Intraoperative utilization of fluoroscopy. Electronically Signed   By: Thornell Sartorius M.D.   On: 01/09/2023 20:54   DG C-Arm 1-60 Min  Result Date: 01/09/2023 CLINICAL DATA:  ERCP for abnormal MRCP. EXAM: DG C-ARM 1-60 MIN; ERCP CONTRAST:  Please see operative report. FLUOROSCOPY: Fluoroscopy Time:  7 minutes 27 seconds Radiation Exposure Index (if provided by the fluoroscopic device): 230.61 mGy Number of Acquired Spot Images: 20 COMPARISON:  01/07/2023. FINDINGS: Fluoroscopy was utilized for ERCP. Please see operative report for additional information. IMPRESSION: Intraoperative utilization of fluoroscopy. Electronically Signed   By: Thornell Sartorius M.D.   On: 01/09/2023 20:54    Labs:  CBC: Recent Labs    01/08/23 0554 01/10/23 0649 01/11/23 0631 01/13/23 0559  WBC 6.6 7.2 8.7 7.3  HGB 12.5 12.7 12.7 12.2  HCT 36.8 37.8 37.6 36.3  PLT 209 203 198 166    COAGS: Recent Labs    12/20/22 1308 01/08/23 0554  INR 1.2 1.3*  APTT 25  --     BMP: Recent Labs    01/10/23 1427 01/11/23 1112 01/12/23 0632 01/13/23 0559  NA 134* 133* 134* 131*  K 4.2 3.9 4.1 3.8  CL 97* 100 101 99  CO2 22 26 23 23   GLUCOSE 167* 168* 138* 142*  BUN 15 11 7* 11  CALCIUM 8.6* 7.9* 7.9* 7.9*  CREATININE 0.77 0.49 0.63 0.73  GFRNONAA >60 >60 >60 >60    LIVER FUNCTION TESTS: Recent Labs    01/10/23 1427 01/11/23 1112 01/12/23 0632 01/13/23 0559  BILITOT 17.2* 16.5* 15.7* 15.1*  AST 68* 63* 49* 44*  ALT 37 29 30 27   ALKPHOS 152* 134* 117 125  PROT 6.8 6.5 6.2* 5.7*  ALBUMIN 2.6* 2.4* 2.1* 2.0*    Assessment and Plan: 72 yo female with hx DM, HTN, hep C antibody reactive and hyperbilirubinemia. Previous liver bx was non  diagnostic for malignant cells. Recent EUS with FNA peripancreatic /portal LN neg as well. Latest MRI abd on 01/07/23 revealed:  1. Again noted are multifocal hypoenhancing liver lesions suspicious for metastatic disease. The dominant lesion within the lateral dome of liver is unchanged from previous exam. 2. Mild intrahepatic bile duct dilatation with abrupt termination of the converging intrahepatic bile loops centrally suggesting underlying central obstructing lesion versus mass effect from enlarged portahepatic lymph nodes. The common bile duct appears nondilated. Consider further evaluation with ERCP. 3. Gallstones with mild gallbladder wall thickening. 4. Enlarged lymph nodes within the hepatic duodenal ligament and retroperitoneum are again noted. Concerning for nodal metastasis. 5. Mild perihepatic ascites and perisplenic ascites.   Pt has had placement of rt/left hepatic duct stents via recent ERCP. T bili remains elevated at  15.1. Request received from GI for rt liver lesion bx. Imaging studies were reviewed by Dr. Milford Cage.Risks and benefits of procedure was discussed with the patient and/or patient's family including, but not limited to bleeding, infection, damage to adjacent structures or low yield requiring additional tests.  All of the questions were answered and there is agreement to proceed.  Consent signed and in chart. Procedure scheduled for today    Electronically Signed: D. Jeananne Rama, PA-C 01/13/2023, 12:54 PM   I spent a total of 25 minutes at the the patient's bedside AND on the patient's hospital floor or unit, greater than 50% of which was counseling/coordinating care for US guided liver mass biopsy    Patient ID: CARENA STREAM, female   DOB: 04/06/1951, 71 y.o.   MRN: [id number]

## 2023-01-13 NOTE — Telephone Encounter (Signed)
Recall has been entered  

## 2023-01-13 NOTE — Sedation Documentation (Addendum)
Sample #1 obtained 

## 2023-01-13 NOTE — Progress Notes (Addendum)
Progress Note  Primary GI: Unassigned (Seen by Dr. 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 Provided some of the history.  Patient states she is doing okay. Had some nausea yesterday that has resolved. Denies abdominal pain, vomiting.   Objective   Vital signs in last 24 hours: Temp:  [97.2 F (36.2 C)-98.7 F (37.1 C)] 97.6 F (36.4 C) (06/11 0348) Pulse Rate:  [71-83] 71 (06/11 0348) Resp:  [13-22] 16 (06/10 2048) BP: (125-150)/(61-117) 150/71 (06/11 0348) SpO2:  [95 %-100 %] 100 % (06/11 0348) Last BM Date : 01/12/23 (patietn reports a BM yesterday) Last BM recorded by nurses in past 5 days Stool Type: Type 6 (Mushy consistency with ragged edges); Type 7 (Liquid consistency with no solid pieces) (01/08/2023  5:52 AM)  General:   female in no acute distress  Heart:  Regular rate and rhythm; no murmurs Pulm: Clear anteriorly; no wheezing Abdomen: soft, nondistended, normal bowel sounds in all quadrants. Nontender without guarding. No organomegaly appreciated. Extremities:  No edema Neurologic:  Alert and  oriented x4;  No focal deficits.  Psych:  Cooperative. Normal mood and affect.  Intake/Output from previous day: 06/10 0701 - 06/11 0700 In: 800 [I.V.:600; IV Piggyback:200] Out: 1100 [Urine:1100] Intake/Output this shift: No intake/output data recorded.  Studies/Results: DG ERCP  Result Date: 01/13/2023 CLINICAL DATA:  [id number] Surgery, elective [id number] EXAM: ERCP COMPARISON:  ERCP 01/09/2023. CT AP, 12/20/2022. MRCP, 12/31/2022 and 01/07/2023. IR ultrasound, 12/23/2022. FLUOROSCOPY: Exposure Index (as provided by the fluoroscopic device): 429 mGy Kerma FINDINGS: Limited oblique planar images of the RIGHT upper quadrant obtained C-arm. Images demonstrating flexible endoscopy, biliary duct cannulation, sphincterotomy, retrograde cholangiogram,  balloon sweep and plastic biliary stent placement. No biliary ductal dilation. No evidence of biliary filling defect is demonstrated. IMPRESSION: Fluoroscopic imaging for ERCP and biliary stent placement. For complete description of intra procedural findings, please see performing service dictation. Electronically Signed   By: Roanna Banning M.D.   On: 01/13/2023 08:02   DG C-Arm 1-60 Min-No Report  Result Date: 01/12/2023 Fluoroscopy was utilized by the requesting physician.  No radiographic interpretation.    Lab Results: Recent Labs    01/11/23 0631 01/13/23 0559  WBC 8.7 7.3  HGB 12.7 12.2  HCT 37.6 36.3  PLT 198 166   BMET Recent Labs    01/11/23 1112 01/12/23 0632 01/13/23 0559  NA 133* 134* 131*  K 3.9 4.1 3.8  CL 100 101 99  CO2 26 23 23   GLUCOSE 168* 138* 142*  BUN 11 7* 11  CREATININE 0.49 0.63 0.73  CALCIUM 7.9* 7.9* 7.9*   LFT Recent Labs    01/13/23 0559  PROT 5.7*  ALBUMIN 2.0*  AST 44*  ALT 27  ALKPHOS 125  BILITOT 15.1*   PT/INR No results for input(s): "LABPROT", "INR" in the last 72 hours.   Scheduled Meds:  acidophilus  1 capsule Oral Daily   amLODipine  10 mg Oral Daily   enoxaparin (LOVENOX) injection  50 mg Subcutaneous Q24H   famotidine  20 mg Oral BID   indomethacin  100 mg Rectal Once   insulin aspart  0-15 Units Subcutaneous TID WC   insulin aspart  0-5 Units Subcutaneous QHS   insulin glargine-yfgn  10 Units Subcutaneous Daily   lisinopril  20 mg Oral Daily   pantoprazole  40 mg Oral BID AC  Ensure Max Protein  11 oz Oral BID   simethicone  80 mg Oral QID   sucralfate  1 g Oral TID WC & HS   Continuous Infusions:  ciprofloxacin 400 mg (01/13/23 0409)      Patient profile:   72 year old female with 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    -Liver biopsy nondiagnostic for malignant cells -EUS with FNA of peripancreatic and portal lns  negative    REPEAT MRCP 6/5: Liver mets.  Mild intrahepatic bile duct dilation with termination of converging intrahepatic bile loops suggesting underlying central obstructing lesion versus mass effect from enlarged porta hepatic lymph nodes.  Gallstones with gallbladder wall thickening.  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 Could be due to extrinsic compression. S/p brushings. S/p insertion of left hepatic duct stent. 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 -CEA 18.9 -AFP 10.2 -CA 19-9 33,659 - ERCP 6/10: prior biliary sphincterotomy open, patent stent in biliary tree (removed and sent for cytology). Multiple biliary strictures - treated with HurriCaine balloon. Left main hepatic duct and right main hepatic duct moderately dilated. One stent removed from bilary tree.  Biliary tree swept and sludge/pus/debris were found.  Cells for cytology deemed.  Plastic biliary stent placed into the right hepatic duct and left hepatic duct.  No gross lesions in esophagus/stomach.  Mass in third/fourth portion of duodenum, biopsied, clips placed.   Plan:   -Await biopsy results -Continue to trend LFTs -Liver biopsy to evaluate right-sided liver lesions -Continue ciprofloxacin twice daily for 7-day course for cholangitis found during ERCP -No signs of pancreatitis -Continue supportive care  Bayley Leanna Sato  01/13/2023, 9:08 AM   [facility] Attending   I have taken an interval history, reviewed the chart and examined the patient. I agree with the Advanced Practitioner's note, impression and recommendations. We are waiting on liver biopsy results.  I think it is quite likely  this lady will need a percutaneous biliary drain.  Will see what bilirubin is tomorrow.  May have duodenal biopsies and ERCP cytology back tomorrow. Iva Boop, MD, [facility] See Loretha Stapler on call - gastroenterology for best contact person 01/13/2023 4:48 PM

## 2023-01-13 NOTE — Sedation Documentation (Signed)
Sample #5 obtained 

## 2023-01-13 NOTE — Sedation Documentation (Signed)
Sample #2 obtained 

## 2023-01-13 NOTE — Progress Notes (Signed)
[facility] Progress Note  Patient: Regina Mckay     ZOX:[id number]  DOA: 01/07/2023   PCP: [name], NP       Brief hospital course: This is a 72 year old female with a history of meningioma, hypertension, diabetes mellitus, severe esophagitis & erosive gastritis & recent Klebsiella bacteremia in May.  Recent imaging reveals liver lesions suspicious for mets, retroperitoneal lymphadenopathy, pulmonary nodules, supraclavicular and mediastinal lymphadenopathy and thyroid nodules.  Has had an EUS with biopy that was inconclusive. No CBD dilatation noted on imaging and EUS. She was seen by oncology on 6/5 and noted to have an increasing bilirubin level and was admitted for hyperbilirubinemia, nausea,and  weakness. ERCP x 2 in hospital- found to have biliary strictures that have been stented. Also,  found to have a duodenal mass.   Subjective:  Feels hungry. Is NPO for liver biopsy. No other complaints.   Assessment and Plan: Principal Problem:   Hyperbilirubinemia- Liver masses, duodenal mass, biliary stricture - Biopsies of liver negative -EUS was performed on 5/30 and fine-needle aspiration of lymph nodes and biopsy of gastric mucosa were negative for malignancy - Patient also has bulky abdominal adenopathy, lung nodules and thyroid nodules -CEA 18.9 - 6/7 ERCP - 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 placed in the left hepatic duct - T bili 13.5> 17.2>  15.7 - 6/10- repeat ERCP- prior stent removed and sent for cytology- 2 new stents placed in (left?) hepatic duct, multiple biliary strictures notes, pus drained from biliary tree, new finding of duodenal mass noted - Recommendations > cont Cipro for infection in biliary tree x 7 days (started on 6/8) > follow LFTs and if no improvement, consider percutaneous stenting by IR > f/u cytology  - repeat liver biopsy ordered by GI- performed today - T bili still elevated at 15.1 tdoay  Active  Problems: History of hepatitis - Most likely hepatitis C as hep C antibodies noted to be positive - the patient states hepatitis was previously treated - HCV RNA ordered and is not detected -AFP 10.2 with the upper limit of normal being 9.2   Esophagitis and erosive gastritis - Continue PPI - Although the patient was discharged with a prescription for sucralfate, she states that she never received it from the pharmacy - Sucralfate started in the hospital  - added Pepcid on 6/7 due to continued belching and indigestion  Elevated Cr/ AKI - Cr .80> 1.20 with poor intake - IVF started and improved to 0.49  History of recent bacteremia due to Klebsiella pneumoniae - source may have been biliary at that time as well - Finished treatment with Augmentin -completed a total of 14 days    Class 3 severe obesity with serious comorbidity and body mass index (BMI) of 40.0 to 44.9 in adult (HCC) Body mass index is 39.44 kg/m.    DM (diabetes mellitus), type 2 with ophthalmic complications (HCC) - Carb modified diet, Semglee and NovoLog sliding scale ordered  Hypertension Continue amlodipine and lisinopril  Thyroid nodules - noted to have a multinodular goiter on [place] on 5/28- repeat US recommended in 1 yr - last TSH 1.148      Code Status: Full Code Consultants: GI, IR Level of Care: Level of care: Med-Surg Total time on patient care: 30 minutes DVT prophylaxis:  SCDs Start: 01/07/23 1354     Objective:   Vitals:   01/12/23 1745 01/12/23 1805 01/12/23 2048 01/13/23 0348  BP:  (!) 141/68 (!) 142/66 Marland Kitchen)  150/71  Pulse:  75 72 71  Resp: 18 20 16    Temp:  98.4 F (36.9 C) 98.2 F (36.8 C) 97.6 F (36.4 C)  TempSrc:  Oral Oral Oral  SpO2:  97% 98% 100%  Weight:      Height:       Filed Weights   01/07/23 1329  Weight: 101 kg   Exam: General exam: Appears comfortable  HEENT: oral mucosa moist Respiratory system: Clear to auscultation.  Cardiovascular system: S1 & S2 heard   Gastrointestinal system: Abdomen soft, non-tender, nondistended. Normal bowel sounds   Extremities: No cyanosis, clubbing or edema Psychiatry:  Mood & affect appropriate.       CBC: Recent Labs  Lab 01/07/23 0954 01/08/23 0554 01/10/23 0649 01/11/23 0631 01/13/23 0559  WBC 7.5 6.6 7.2 8.7 7.3  NEUTROABS 5.5  --  5.7 7.3  --   HGB 14.0 12.5 12.7 12.7 12.2  HCT 40.2 36.8 37.8 37.6 36.3  MCV 84.8 87.0 87.7 87.0 86.4  PLT 230 209 203 198 166    Basic Metabolic Panel: Recent Labs  Lab 01/10/23 0649 01/10/23 1427 01/11/23 1112 01/12/23 0632 01/13/23 0559  NA 131* 134* 133* 134* 131*  K 4.0 4.2 3.9 4.1 3.8  CL 94* 97* 100 101 99  CO2 24 22 26 23 23   GLUCOSE 193* 167* 168* 138* 142*  BUN 16 15 11  7* 11  CREATININE 1.20* 0.77 0.49 0.63 0.73  CALCIUM 8.3* 8.6* 7.9* 7.9* 7.9*    GFR: Estimated Creatinine Clearance: 71 mL/min (by C-G formula based on SCr of 0.73 mg/dL).  Scheduled Meds:  acidophilus  1 capsule Oral Daily   amLODipine  10 mg Oral Daily   enoxaparin (LOVENOX) injection  50 mg Subcutaneous Q24H   famotidine  20 mg Oral BID   indomethacin  100 mg Rectal Once   insulin aspart  0-15 Units Subcutaneous TID WC   insulin aspart  0-5 Units Subcutaneous QHS   insulin glargine-yfgn  10 Units Subcutaneous Daily   lisinopril  20 mg Oral Daily   pantoprazole  40 mg Oral BID AC   Ensure Max Protein  11 oz Oral BID   simethicone  80 mg Oral QID   sucralfate  1 g Oral TID WC & HS   Continuous Infusions:  ciprofloxacin 400 mg (01/13/23 0409)   Imaging and lab data was personally reviewed DG C-Arm 1-60 Min-No Report  Result Date: 01/12/2023 Fluoroscopy was utilized by the requesting physician.  No radiographic interpretation.    LOS: 6 days   Author: Calvert Cantor  01/13/2023 7:51 AM  To contact [facility]>   Check the care team in [facility] and look for the attending/consulting [facility] provider listed  Log into [email or web address] and use [place]'s universal  password   Go to> "[facility]"  and find provider  If you still have difficulty reaching the provider, please page the [facility] (Director on Call) for the Hospitalists listed on amion

## 2023-01-13 NOTE — Procedures (Signed)
Vascular and Interventional Radiology Procedure Note  Patient: Regina Mckay DOB: [birth year] Medical Record Number: [id number] Note Date/Time: 01/13/23 1:01 PM   Performing Physician: Roanna Banning, MD Assistant(s): None  Diagnosis: Liver masses. No DX. Prev Bx Neg   Procedure: LIVER MASS BIOPSY  Anesthesia: Conscious Sedation Complications: None Estimated Blood Loss: Minimal Specimens: Sent for Pathology  Findings:  Successful Ultrasound-guided biopsy of liver mass. A total of 5 samples were obtained. Hemostasis of the tract was achieved using Gelfoam Slurry Embolization.  Plan: Bed rest for 2 hours.  See detailed procedure note with images in PACS. The patient tolerated the procedure well without incident or complication and was returned to Floor Bed in stable condition.    Roanna Banning, MD [facility] Radiology   Pager. [phone] Clinic. [phone]

## 2023-01-14 ENCOUNTER — Encounter: Payer: Self-pay | Admitting: Gastroenterology

## 2023-01-14 ENCOUNTER — Encounter (HOSPITAL_COMMUNITY): Payer: Self-pay | Admitting: Gastroenterology

## 2023-01-14 DIAGNOSIS — D132 Benign neoplasm of duodenum: Secondary | ICD-10-CM | POA: Diagnosis not present

## 2023-01-14 DIAGNOSIS — R160 Hepatomegaly, not elsewhere classified: Secondary | ICD-10-CM | POA: Diagnosis not present

## 2023-01-14 DIAGNOSIS — K831 Obstruction of bile duct: Secondary | ICD-10-CM | POA: Diagnosis not present

## 2023-01-14 LAB — COMPREHENSIVE METABOLIC PANEL
ALT: 29 U/L (ref 0–44)
AST: 49 U/L — ABNORMAL HIGH (ref 15–41)
Albumin: 2.1 g/dL — ABNORMAL LOW (ref 3.5–5.0)
Alkaline Phosphatase: 120 U/L (ref 38–126)
Anion gap: 9 (ref 5–15)
BUN: 13 mg/dL (ref 8–23)
CO2: 23 mmol/L (ref 22–32)
Calcium: 7.7 mg/dL — ABNORMAL LOW (ref 8.9–10.3)
Chloride: 100 mmol/L (ref 98–111)
Creatinine, Ser: 0.85 mg/dL (ref 0.44–1.00)
GFR, Estimated: >60 mL/min (ref >60)
Glucose, Bld: 186 mg/dL — ABNORMAL HIGH (ref 70–99)
Potassium: 3.5 mmol/L (ref 3.5–5.1)
Sodium: 132 mmol/L — ABNORMAL LOW (ref 135–145)
Total Bilirubin: 14.3 mg/dL — ABNORMAL HIGH (ref 0.3–1.2)
Total Protein: 6.2 g/dL — ABNORMAL LOW (ref 6.5–8.1)

## 2023-01-14 LAB — GLUCOSE, CAPILLARY
Glucose-Capillary: 160 mg/dL — ABNORMAL HIGH (ref 70–99)
Glucose-Capillary: 157 mg/dL — ABNORMAL HIGH (ref 70–99)
Glucose-Capillary: 188 mg/dL — ABNORMAL HIGH (ref 70–99)
Glucose-Capillary: 176 mg/dL — ABNORMAL HIGH (ref 70–99)

## 2023-01-14 LAB — SURGICAL PATHOLOGY

## 2023-01-14 LAB — CYTOLOGY - NON PAP

## 2023-01-14 MED ORDER — LOPERAMIDE HCL 2 MG PO CAPS
2.0000 mg | ORAL_CAPSULE | ORAL | Status: DC | PRN
  Administered 2023-01-14: 2 mg via ORAL
  Filled 2023-01-14: qty 1

## 2023-01-14 MED ORDER — PROCHLORPERAZINE EDISYLATE 10 MG/2ML IJ SOLN
10.0000 mg | Freq: Four times a day (QID) | INTRAMUSCULAR | Status: DC | PRN
  Administered 2023-01-14 – 2023-01-17 (×4): 10 mg via INTRAVENOUS
  Filled 2023-01-14 (×4): qty 2

## 2023-01-14 NOTE — Progress Notes (Signed)
PROGRESS NOTE    Regina Mckay  ZOX:[id number] DOB: Dec 21, 1950 DOA: 01/07/2023 PCP: [name], NP   Brief Narrative:  This is a 72 year old female with a history of meningioma, hypertension, diabetes mellitus, severe esophagitis & erosive gastritis & recent Klebsiella bacteremia in May. Recent imaging reveals liver lesions suspicious for mets, retroperitoneal lymphadenopathy, pulmonary nodules, supraclavicular and mediastinal lymphadenopathy and thyroid nodules. EUS with biopy that was inconclusive. No CBD dilatation noted on imaging and EUS.  She was seen by oncology on 6/5 and noted to have an increasing bilirubin level and was admitted for hyperbilirubinemia, nausea,and  weakness. ERCP x 2 in hospital- found to have biliary strictures that have been stented.  Imaging also concerning for duodenal mass.   Multiple biopsy, fine-needle aspiration have returned negative from pathology.  Bilirubin continues to downtrend appropriately albeit slowly.  Symptoms appear to be minimal per patient and daughter at bedside.  Assessment & Plan:   Principal Problem:   Hyperbilirubinemia Active Problems:   Bacteremia due to Klebsiella pneumoniae   Class 3 severe obesity with serious comorbidity and body mass index (BMI) of 40.0 to 44.9 in adult [facility])   DM (diabetes mellitus), type 2 with ophthalmic complications (HCC)   Liver masses   Obstructive jaundice   Liver lesion, right lobe   History of ERCP   History of biliary duct stent placement   Hyperbilirubinemia -likely obstructive given multiple liver masses, duodenal mass, and biliary stricture - Biopsies of liver negative thus far, fine-needle aspiration of lymph node and gastric mucosa biopsy are also negative for malignancy per pathology. 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 - 6/10- repeat ERCP- prior stent (from 6/7) removed and sent for cytology- replaced by 2 new stents, multiple biliary strictures notes, pus drained from biliary tree, new finding of duodenal mass noted - Cipro ongoing for infection in biliary tree x 7 days (Stop date 01/16/23) -Liver panel improving - unclear if percutaneous stent still being considered. Defer to GI/IR - Repeat liver biopsy 01/13/23   History of hepatitis - Most likely hepatitis C given positive antibody testing, noted to be previously treated per patient - HCV RNA testing: not detected   Esophagitis and erosive gastritis - Continue PPI - Sucralfate started in the hospital  - Continue additional Pepcid  Diarrhea, intractable - Unclear etiology - does not appear infectious. Possibly in the setting of above biliary obstruction vs antibiotic administration - Imodium PRN loose stool   Elevated Cr/ AKI resolved - Cr .80> 1.20 with poor intake   History of recent bacteremia due to Klebsiella pneumoniae - Resolved - source may have been biliary at that time as well -Completed 14 days of Augmentin   Class 3 severe obesity with serious comorbidity and body mass index (BMI) of 40.0 to 44.9 in adult [facility]) Body mass index is 39.44 kg/m.   IDDM (diabetes mellitus), type 2 with ophthalmic complications (HCC), well controlled - Carb modified diet, Semglee and NovoLog sliding scale ordered - A1C 5.8   Hypertension Moderately well-controlled, continue amlodipine and lisinopril   Thyroid nodules, previously noted - noted to have a multinodular goiter on [place] on 5/28- repeat US recommended in 1 yr -  last TSH 1.148   DVT prophylaxis: SCDs Start: 01/07/23 1354 Code Status:   Code Status: Full Code Family Communication: Daughter at bedside  Status is: Inpatient  Dispo: The patient  is from: Home              Anticipated d/c is to: To be determined, likely home              Anticipated d/c date is: To be determined              Patient currently not medically stable for discharge given advanced disease need for ongoing imaging, likely procedure possible intervention in the setting of biliary tree obstruction concerning for metastatic cancer.  Consultants:  GI, IR  Procedures:  Upper endoscopy ultrasound, ERCP with stent placement, repeat ERCP with stent removal and 2 stent placements   Antimicrobials:  As above ciprofloxacin stop date 01/16/2023  Subjective: Patient reports worsening diarrhea and episode of nausea and vomiting overnight.  Otherwise attempting to increase p.o. intake slowly as tolerated.  Denies headache fever chills chest pain shortness of breath.  Objective: Vitals:   01/13/23 1345 01/13/23 1549 01/13/23 2007 01/14/23 0507  BP: (!) 116/46 (!) 142/63 (!) 122/55 (!) 143/63  Pulse: 70 71 75 75  Resp: 16 20 16 16   Temp: 98 F (36.7 C) 97.8 F (36.6 C) 98.3 F (36.8 C) (!) 97.4 F (36.3 C)  TempSrc: Oral Oral Oral Oral  SpO2: 96% 99% 98% 96%  Weight:      Height:        Intake/Output Summary (Last 24 hours) at 01/14/2023 0743 Last data filed at 01/13/2023 1800 Gross per 24 hour  Intake 240 ml  Output --  Net 240 ml   Filed Weights   01/07/23 1329  Weight: 101 kg    Examination:  General exam: Appears calm and comfortable  Respiratory system: Clear to auscultation. Respiratory effort normal. Cardiovascular system: S1 & S2 heard, RRR. No JVD, murmurs, rubs, gallops or clicks. No pedal edema. Gastrointestinal system: Abdomen is nondistended, soft and nontender. No organomegaly or masses felt. Normal bowel sounds heard. Central nervous system: Alert and oriented. No focal neurological deficits. Extremities: Symmetric 5 x 5 power. Skin: No rashes, lesions or ulcers Psychiatry: Judgement and insight appear normal. Mood & affect  appropriate.     Data Reviewed: I have personally reviewed following labs and imaging studies  CBC: Recent Labs  Lab 01/07/23 0954 01/08/23 0554 01/10/23 0649 01/11/23 0631 01/13/23 0559  WBC 7.5 6.6 7.2 8.7 7.3  NEUTROABS 5.5  --  5.7 7.3  --   HGB 14.0 12.5 12.7 12.7 12.2  HCT 40.2 36.8 37.8 37.6 36.3  MCV 84.8 87.0 87.7 87.0 86.4  PLT 230 209 203 198 166   Basic Metabolic Panel: Recent Labs  Lab 01/10/23 1427 01/11/23 1112 01/12/23 0632 01/13/23 0559 01/14/23 0553  NA 134* 133* 134* 131* 132*  K 4.2 3.9 4.1 3.8 3.5  CL 97* 100 101 99 100  CO2 22 26 23 23 23   GLUCOSE 167* 168* 138* 142* 186*  BUN 15 11 7* 11 13  CREATININE 0.77 0.49 0.63 0.73 0.85  CALCIUM 8.6* 7.9* 7.9* 7.9* 7.7*   GFR: Estimated Creatinine Clearance: 66.8 mL/min (by C-G formula based on SCr of 0.85 mg/dL). Liver Function Tests: Recent Labs  Lab 01/10/23 1427 01/11/23 1112 01/12/23 0632 01/13/23 0559 01/14/23 0553  AST 68* 63* 49* 44* 49*  ALT 37 29 30 27 29   ALKPHOS 152* 134*  117 125 120  BILITOT 17.2* 16.5* 15.7* 15.1* 14.3*  PROT 6.8 6.5 6.2* 5.7* 6.2*  ALBUMIN 2.6* 2.4* 2.1* 2.0* 2.1*   No results for input(s): "LIPASE", "AMYLASE" in the last 168 hours. No results for input(s): "AMMONIA" in the last 168 hours. Coagulation Profile: Recent Labs  Lab 01/08/23 0554  INR 1.3*   Cardiac Enzymes: No results for input(s): "CKTOTAL", "CKMB", "CKMBINDEX", "TROPONINI" in the last 168 hours. BNP (last 3 results) No results for input(s): "PROBNP" in the last 8760 hours. HbA1C: No results for input(s): "HGBA1C" in the last 72 hours. CBG: Recent Labs  Lab 01/12/23 1801 01/12/23 2050 01/13/23 0737 01/13/23 1545 01/13/23 2104  GLUCAP 144* 129* 123* 76 175*   Lipid Profile: No results for input(s): "CHOL", "HDL", "LDLCALC", "TRIG", "CHOLHDL", "LDLDIRECT" in the last 72 hours. Thyroid Function Tests: No results for input(s): "TSH", "T4TOTAL", "FREET4", "T3FREE", "THYROIDAB" in the  last 72 hours. Anemia Panel: No results for input(s): "VITAMINB12", "FOLATE", "FERRITIN", "TIBC", "IRON", "RETICCTPCT" in the last 72 hours. Sepsis Labs: No results for input(s): "PROCALCITON", "LATICACIDVEN" in the last 168 hours.  Recent Results (from the past 240 hour(s))  Gastrointestinal Panel by PCR , Stool     Status: None   Collection Time: 01/07/23 12:00 PM  Result Value Ref Range Status   Campylobacter species NOT DETECTED NOT DETECTED Final   Plesimonas shigelloides NOT DETECTED NOT DETECTED Final   Salmonella species NOT DETECTED NOT DETECTED Final   Yersinia enterocolitica NOT DETECTED NOT DETECTED Final   Vibrio species NOT DETECTED NOT DETECTED Final   Vibrio cholerae NOT DETECTED NOT DETECTED Final   Enteroaggregative E coli (EAEC) NOT DETECTED NOT DETECTED Final   Enteropathogenic E coli (EPEC) NOT DETECTED NOT DETECTED Final   Enterotoxigenic E coli (ETEC) NOT DETECTED NOT DETECTED Final   Shiga like toxin producing E coli (STEC) NOT DETECTED NOT DETECTED Final   Shigella/Enteroinvasive E coli (EIEC) NOT DETECTED NOT DETECTED Final   Cryptosporidium NOT DETECTED NOT DETECTED Final   Cyclospora cayetanensis NOT DETECTED NOT DETECTED Final   Entamoeba histolytica NOT DETECTED NOT DETECTED Final   Giardia lamblia NOT DETECTED NOT DETECTED Final   Adenovirus F40/41 NOT DETECTED NOT DETECTED Final   Astrovirus NOT DETECTED NOT DETECTED Final   Norovirus GI/GII NOT DETECTED NOT DETECTED Final   Rotavirus A NOT DETECTED NOT DETECTED Final   Sapovirus (I, II, IV, and V) NOT DETECTED NOT DETECTED Final    Comment: Performed at [facility], [address] [place], [place] [address]  C difficile quick screen w PCR reflex     Status: None   Collection Time: 01/07/23 12:06 PM   Specimen: STOOL  Result Value Ref Range Status   C Diff antigen NEGATIVE NEGATIVE Final   C Diff toxin NEGATIVE NEGATIVE Final   C Diff interpretation No C. difficile detected.  Final     Comment: Performed at [facility], [address] [place], [place] [address]         Radiology Studies: US BIOPSY (LIVER)  Result Date: 01/13/2023 INDICATION: [id number] Liver mass [id number] Briefly, 72 year old female with liver masses suspicious for metastases. Previous liver mass biopsy 12/23/2022 was NEGATIVE malignancy. EXAM: ULTRASOUND GUIDED LIVER MASS BIOPSY COMPARISON:  MRI abdomen, 01/07/2023 and 12/22/2022. CT AP, 12/20/2022. IR ultrasound, 12/23/2022. MEDICATIONS: None ANESTHESIA/SEDATION: Moderate (conscious) sedation was employed during this procedure. A total of Versed 2 mg and Fentanyl 100 mcg was administered intravenously. Moderate Sedation Time: 19 minutes. The patient's level  of consciousness and vital signs were monitored continuously by radiology nursing throughout the procedure under my direct supervision. COMPLICATIONS: None immediate. PROCEDURE: Informed written consent was obtained from the patient and/or patient's representative after a discussion of the risks, benefits and alternatives to treatment. The patient understands and consents the procedure. A timeout was performed prior to the initiation of the procedure. Ultrasound scanning was performed of the right upper abdominal quadrant demonstrates ill-defined, heterogeneous inferior RIGHT hepatic lobe mass The RIGHT flank was selected for biopsy and the procedure was planned. The right upper abdominal quadrant was prepped and draped in the usual sterile fashion. The overlying soft tissues were anesthetized with 1% lidocaine with epinephrine. A 17 gauge, 6.8 cm co-axial needle was advanced into a peripheral aspect of the lesion. This was followed by 5 core biopsies with an 18 gauge core device under direct ultrasound guidance. The coaxial needle tract was embolized with a small amount of Gel-Foam slurry and superficial hemostasis was obtained with manual compression. Post procedural scanning was negative for  definitive area of hemorrhage or additional complication. A dressing was placed. The patient tolerated the procedure well without immediate post procedural complication. IMPRESSION: Successful ultrasound guided core needle biopsy of liver mass. Roanna Banning, MD Vascular and Interventional Radiology Specialists [facility] Radiology Electronically Signed   By: Roanna Banning M.D.   On: 01/13/2023 13:53   DG ERCP  Result Date: 01/13/2023 CLINICAL DATA:  [id number] Surgery, elective [id number] EXAM: ERCP COMPARISON:  ERCP 01/09/2023. CT AP, 12/20/2022. MRCP, 12/31/2022 and 01/07/2023. IR ultrasound, 12/23/2022. FLUOROSCOPY: Exposure Index (as provided by the fluoroscopic device): 429 mGy Kerma FINDINGS: Limited oblique planar images of the RIGHT upper quadrant obtained C-arm. Images demonstrating flexible endoscopy, biliary duct cannulation, sphincterotomy, retrograde cholangiogram, balloon sweep and plastic biliary stent placement. No biliary ductal dilation. No evidence of biliary filling defect is demonstrated. IMPRESSION: Fluoroscopic imaging for ERCP and biliary stent placement. For complete description of intra procedural findings, please see performing service dictation. Electronically Signed   By: Roanna Banning M.D.   On: 01/13/2023 08:02   DG C-Arm 1-60 Min-No Report  Result Date: 01/12/2023 Fluoroscopy was utilized by the requesting physician.  No radiographic interpretation.    Scheduled Meds:  acidophilus  1 capsule Oral Daily   amLODipine  10 mg Oral Daily   enoxaparin (LOVENOX) injection  50 mg Subcutaneous Q24H   famotidine  20 mg Oral BID   indomethacin  100 mg Rectal Once   insulin aspart  0-15 Units Subcutaneous TID WC   insulin aspart  0-5 Units Subcutaneous QHS   insulin glargine-yfgn  10 Units Subcutaneous Daily   lisinopril  20 mg Oral Daily   pantoprazole  40 mg Oral BID AC   Ensure Max Protein  11 oz Oral BID   simethicone  80 mg Oral QID   sucralfate  1 g Oral TID WC & HS    Continuous Infusions:  ciprofloxacin 400 mg (01/14/23 0331)     LOS: 7 days   Time spent:  Azucena Fallen, DO [facility]  If 7PM-7AM, please contact night-coverage [email or web address]  01/14/2023, 7:43 AM

## 2023-01-14 NOTE — Progress Notes (Signed)
   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  ----------------------------------------------------------------------------------------------  Waiting on path and LFT trends - may need PBD by IR - if no appreciable change in bili tomorrow will consult IR  Iva Boop, MD, [facility] Gastroenterology See Loretha Stapler on call - gastroenterology for best contact person 01/14/2023 4:38 PM   Subjective  Some nausea and vomiting overnight but not today   Objective  BP 121/66 (BP Location: Left Arm)   Pulse 77   Temp 97.9 F (36.6 C) (Oral)   Resp 20   Ht 5\' 3"  (1.6 m)   Wt 101 kg   SpO2 98%   BMI 39.44 kg/m   Recent Labs  Lab 01/08/23 0554 01/09/23 1957 01/10/23 1427 01/11/23 1112 01/12/23 0632 01/13/23 0559 01/14/23 0553  AST 57*   < > 68* 63* 49* 44* 49*  ALT 41   < > 37 29 30 27 29   ALKPHOS 138*   < > 152* 134* 117 125 120  BILITOT 13.5*   < > 17.2* 16.5* 15.7* 15.1* 14.3*  PROT 6.2*   < > 6.8 6.5 6.2* 5.7* 6.2*  ALBUMIN 2.3*   < > 2.6* 2.4* 2.1* 2.0* 2.1*  INR 1.3*  --   --   --   --   --   --    < > = values in this interval not displayed.   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

## 2023-01-14 NOTE — Care Management Important Message (Signed)
Important Message  Patient Details IM Letter given. Name: Regina Mckay MRN: [id number] Date of Birth: August 23, 1950   Medicare Important Message Given:  Yes     Caren Macadam 01/14/2023, 9:10 AM

## 2023-01-14 NOTE — Progress Notes (Signed)
Regina Mckay   DOB:Dec 15, 1950   WG#:[id number]   VHQ#:[id number]  Medical oncology follow-up note  Subjective: Patient is known to me, was admitted from my office to [facility] 2 days ago due to worsening LFTs, especially hyperbilirubinemia.  She has undergone ERCP and stent placement twice by Dr. Chales Abrahams and Dr. Meridee Score, and the liver biopsy by IR yesterday.  She had a rough night last night, with intermittent nausea and vomiting.  She was n.p.o. for procedures in the past 2 days, and had a big meal last night.    Objective:  Vitals:   01/13/23 2007 01/14/23 0507  BP: (!) 122/55 (!) 143/63  Pulse: 75 75  Resp: 16 16  Temp: 98.3 F (36.8 C) (!) 97.4 F (36.3 C)  SpO2: 98% 96%    Body mass index is 39.44 kg/m.  Intake/Output Summary (Last 24 hours) at 01/14/2023 0818 Last data filed at 01/13/2023 1800 Gross per 24 hour  Intake 240 ml  Output --  Net 240 ml     Sclerae unicteric  Oropharynx clear  No peripheral adenopathy  Lungs clear -- no rales or rhonchi  Heart regular rate and rhythm  Abdomen benign  MSK no focal spinal tenderness, no peripheral edema  Neuro nonfocal    CBG (last 3)  Recent Labs    01/13/23 1545 01/13/23 2104 01/14/23 0746  GLUCAP 76 175* 160*     Labs:   Urine Studies No results for input(s): "UHGB", "CRYS" in the last 72 hours.  Invalid input(s): "UACOL", "UAPR", "USPG", "UPH", "UTP", "UGL", "UKET", "UBIL", "UNIT", "UROB", "ULEU", "UEPI", "UWBC", "URBC", "UBAC", "CAST", "UCOM", "BILUA"  Basic Metabolic Panel: Recent Labs  Lab 01/10/23 1427 01/11/23 1112 01/12/23 0632 01/13/23 0559 01/14/23 0553  NA 134* 133* 134* 131* 132*  K 4.2 3.9 4.1 3.8 3.5  CL 97* 100 101 99 100  CO2 22 26 23 23 23   GLUCOSE 167* 168* 138* 142* 186*  BUN 15 11 7* 11 13  CREATININE 0.77 0.49 0.63 0.73 0.85  CALCIUM 8.6* 7.9* 7.9* 7.9* 7.7*   GFR Estimated Creatinine Clearance: 66.8 mL/min (by C-G formula based on SCr of 0.85 mg/dL). Liver  Function Tests: Recent Labs  Lab 01/10/23 1427 01/11/23 1112 01/12/23 0632 01/13/23 0559 01/14/23 0553  AST 68* 63* 49* 44* 49*  ALT 37 29 30 27 29   ALKPHOS 152* 134* 117 125 120  BILITOT 17.2* 16.5* 15.7* 15.1* 14.3*  PROT 6.8 6.5 6.2* 5.7* 6.2*  ALBUMIN 2.6* 2.4* 2.1* 2.0* 2.1*   No results for input(s): "LIPASE", "AMYLASE" in the last 168 hours. No results for input(s): "AMMONIA" in the last 168 hours. Coagulation profile Recent Labs  Lab 01/08/23 0554  INR 1.3*    CBC: Recent Labs  Lab 01/07/23 0954 01/08/23 0554 01/10/23 0649 01/11/23 0631 01/13/23 0559  WBC 7.5 6.6 7.2 8.7 7.3  NEUTROABS 5.5  --  5.7 7.3  --   HGB 14.0 12.5 12.7 12.7 12.2  HCT 40.2 36.8 37.8 37.6 36.3  MCV 84.8 87.0 87.7 87.0 86.4  PLT 230 209 203 198 166   Cardiac Enzymes: No results for input(s): "CKTOTAL", "CKMB", "CKMBINDEX", "TROPONINI" in the last 168 hours. BNP: Invalid input(s): "POCBNP" CBG: Recent Labs  Lab 01/12/23 2050 01/13/23 0737 01/13/23 1545 01/13/23 2104 01/14/23 0746  GLUCAP 129* 123* 76 175* 160*   D-Dimer No results for input(s): "DDIMER" in the last 72 hours. Hgb A1c No results for input(s): "HGBA1C" in the last 72 hours. Lipid Profile No  results for input(s): "CHOL", "HDL", "LDLCALC", "TRIG", "CHOLHDL", "LDLDIRECT" in the last 72 hours. Thyroid function studies No results for input(s): "TSH", "T4TOTAL", "T3FREE", "THYROIDAB" in the last 72 hours.  Invalid input(s): "FREET3" Anemia work up No results for input(s): "VITAMINB12", "FOLATE", "FERRITIN", "TIBC", "IRON", "RETICCTPCT" in the last 72 hours. Microbiology Recent Results (from the past 240 hour(s))  Gastrointestinal Panel by PCR , Stool     Status: None   Collection Time: 01/07/23 12:00 PM  Result Value Ref Range Status   Campylobacter species NOT DETECTED NOT DETECTED Final   Plesimonas shigelloides NOT DETECTED NOT DETECTED Final   Salmonella species NOT DETECTED NOT DETECTED Final   Yersinia  enterocolitica NOT DETECTED NOT DETECTED Final   Vibrio species NOT DETECTED NOT DETECTED Final   Vibrio cholerae NOT DETECTED NOT DETECTED Final   Enteroaggregative E coli (EAEC) NOT DETECTED NOT DETECTED Final   Enteropathogenic E coli (EPEC) NOT DETECTED NOT DETECTED Final   Enterotoxigenic E coli (ETEC) NOT DETECTED NOT DETECTED Final   Shiga like toxin producing E coli (STEC) NOT DETECTED NOT DETECTED Final   Shigella/Enteroinvasive E coli (EIEC) NOT DETECTED NOT DETECTED Final   Cryptosporidium NOT DETECTED NOT DETECTED Final   Cyclospora cayetanensis NOT DETECTED NOT DETECTED Final   Entamoeba histolytica NOT DETECTED NOT DETECTED Final   Giardia lamblia NOT DETECTED NOT DETECTED Final   Adenovirus F40/41 NOT DETECTED NOT DETECTED Final   Astrovirus NOT DETECTED NOT DETECTED Final   Norovirus GI/GII NOT DETECTED NOT DETECTED Final   Rotavirus A NOT DETECTED NOT DETECTED Final   Sapovirus (I, II, IV, and V) NOT DETECTED NOT DETECTED Final    Comment: Performed at [facility], [address] [place], [place] [address]  C difficile quick screen w PCR reflex     Status: None   Collection Time: 01/07/23 12:06 PM   Specimen: STOOL  Result Value Ref Range Status   C Diff antigen NEGATIVE NEGATIVE Final   C Diff toxin NEGATIVE NEGATIVE Final   C Diff interpretation No C. difficile detected.  Final    Comment: Performed at [facility], [address] [place], [place] [address]      Studies:  US BIOPSY (LIVER)  Result Date: 01/13/2023 INDICATION: [id number] Liver mass [id number] Briefly, 72 year old female with liver masses suspicious for metastases. Previous liver mass biopsy 12/23/2022 was NEGATIVE malignancy. EXAM: ULTRASOUND GUIDED LIVER MASS BIOPSY COMPARISON:  MRI abdomen, 01/07/2023 and 12/22/2022. CT AP, 12/20/2022. IR ultrasound, 12/23/2022. MEDICATIONS: None ANESTHESIA/SEDATION: Moderate (conscious) sedation was employed during this procedure. A total  of Versed 2 mg and Fentanyl 100 mcg was administered intravenously. Moderate Sedation Time: 19 minutes. The patient's level of consciousness and vital signs were monitored continuously by radiology nursing throughout the procedure under my direct supervision. COMPLICATIONS: None immediate. PROCEDURE: Informed written consent was obtained from the patient and/or patient's representative after a discussion of the risks, benefits and alternatives to treatment. The patient understands and consents the procedure. A timeout was performed prior to the initiation of the procedure. Ultrasound scanning was performed of the right upper abdominal quadrant demonstrates ill-defined, heterogeneous inferior RIGHT hepatic lobe mass The RIGHT flank was selected for biopsy and the procedure was planned. The right upper abdominal quadrant was prepped and draped in the usual sterile fashion. The overlying soft tissues were anesthetized with 1% lidocaine with epinephrine. A 17 gauge, 6.8 cm co-axial needle was advanced into a peripheral aspect of the lesion. This was followed by 5 core  biopsies with an 18 gauge core device under direct ultrasound guidance. The coaxial needle tract was embolized with a small amount of Gel-Foam slurry and superficial hemostasis was obtained with manual compression. Post procedural scanning was negative for definitive area of hemorrhage or additional complication. A dressing was placed. The patient tolerated the procedure well without immediate post procedural complication. IMPRESSION: Successful ultrasound guided core needle biopsy of liver mass. Roanna Banning, MD [facility] Radiology Electronically Signed   By: Roanna Banning M.D.   On: 01/13/2023 13:53   DG ERCP  Result Date: 01/13/2023 CLINICAL DATA:  [id number] Surgery, elective [id number] EXAM: ERCP COMPARISON:  ERCP 01/09/2023. CT AP, 12/20/2022. MRCP, 12/31/2022 and 01/07/2023. IR ultrasound, 12/23/2022.  FLUOROSCOPY: Exposure Index (as provided by the fluoroscopic device): 429 mGy Kerma FINDINGS: Limited oblique planar images of the RIGHT upper quadrant obtained C-arm. Images demonstrating flexible endoscopy, biliary duct cannulation, sphincterotomy, retrograde cholangiogram, balloon sweep and plastic biliary stent placement. No biliary ductal dilation. No evidence of biliary filling defect is demonstrated. IMPRESSION: Fluoroscopic imaging for ERCP and biliary stent placement. For complete description of intra procedural findings, please see performing service dictation. Electronically Signed   By: Roanna Banning M.D.   On: 01/13/2023 08:02   DG C-Arm 1-60 Min-No Report  Result Date: 01/12/2023 Fluoroscopy was utilized by the requesting physician.  No radiographic interpretation.    Assessment: 72 y.o. female   Hyperbilirubinemia and liver masses, concerning for metastatic disease Duodenal mass, biopsy done on 6/10, result pending  History of recent bacteremia due to Klebsiella pneumoniae  Type 2 diabetes Hypertension  Plan:  -I will follow-up of her biopsy results with the pathology -her tbil is slightly trending down, will continue monitoring, she may need a PTC by IR if no significant improvement  -I will see her back when I have the results from biopsy   Malachy Mood, MD 01/14/2023  8:18 AM

## 2023-01-15 ENCOUNTER — Inpatient Hospital Stay (HOSPITAL_COMMUNITY): Payer: Medicare Other

## 2023-01-15 ENCOUNTER — Encounter: Payer: Self-pay | Admitting: Gastroenterology

## 2023-01-15 LAB — COMPREHENSIVE METABOLIC PANEL
ALT: 27 U/L (ref 0–44)
AST: 50 U/L — ABNORMAL HIGH (ref 15–41)
Albumin: 2.2 g/dL — ABNORMAL LOW (ref 3.5–5.0)
Alkaline Phosphatase: 138 U/L — ABNORMAL HIGH (ref 38–126)
Anion gap: 10 (ref 5–15)
BUN: 11 mg/dL (ref 8–23)
CO2: 23 mmol/L (ref 22–32)
Calcium: 8.1 mg/dL — ABNORMAL LOW (ref 8.9–10.3)
Chloride: 102 mmol/L (ref 98–111)
Creatinine, Ser: 0.85 mg/dL (ref 0.44–1.00)
GFR, Estimated: >60 mL/min (ref >60)
Glucose, Bld: 179 mg/dL — ABNORMAL HIGH (ref 70–99)
Potassium: 3.5 mmol/L (ref 3.5–5.1)
Sodium: 135 mmol/L (ref 135–145)
Total Bilirubin: 15.8 mg/dL — ABNORMAL HIGH (ref 0.3–1.2)
Total Protein: 6.4 g/dL — ABNORMAL LOW (ref 6.5–8.1)

## 2023-01-15 LAB — CBC
HCT: 38.6 % (ref 36.0–46.0)
Hemoglobin: 13 g/dL (ref 12.0–15.0)
MCH: 29.5 pg (ref 26.0–34.0)
MCHC: 33.7 g/dL (ref 30.0–36.0)
MCV: 87.7 fL (ref 80.0–100.0)
Platelets: 205 10*3/uL (ref 150–400)
RBC: 4.4 MIL/uL (ref 3.87–5.11)
RDW: 17.9 % — ABNORMAL HIGH (ref 11.5–15.5)
WBC: 7.5 10*3/uL (ref 4.0–10.5)
nRBC: 0 % (ref 0.0–0.2)

## 2023-01-15 LAB — GLUCOSE, CAPILLARY
Glucose-Capillary: 137 mg/dL — ABNORMAL HIGH (ref 70–99)
Glucose-Capillary: 149 mg/dL — ABNORMAL HIGH (ref 70–99)
Glucose-Capillary: 158 mg/dL — ABNORMAL HIGH (ref 70–99)
Glucose-Capillary: 175 mg/dL — ABNORMAL HIGH (ref 70–99)

## 2023-01-15 LAB — SURGICAL PATHOLOGY

## 2023-01-15 MED ORDER — SIMETHICONE 80 MG PO CHEW: 80.0000 mg | CHEWABLE_TABLET | Freq: Four times a day (QID) | ORAL | Status: DC | PRN

## 2023-01-15 MED ORDER — ENOXAPARIN SODIUM 60 MG/0.6ML IJ SOSY
50.0000 mg | PREFILLED_SYRINGE | INTRAMUSCULAR | Status: DC
  Administered 2023-01-16: 50 mg via SUBCUTANEOUS
  Filled 2023-01-15: qty 0.6

## 2023-01-15 MED ORDER — CIPROFLOXACIN HCL 500 MG PO TABS
500.0000 mg | ORAL_TABLET | Freq: Two times a day (BID) | ORAL | Status: AC
  Administered 2023-01-15 – 2023-01-16 (×3): 500 mg via ORAL
  Filled 2023-01-15 (×3): qty 1

## 2023-01-15 MED ORDER — IOHEXOL 300 MG/ML  SOLN
100.0000 mL | Freq: Once | INTRAMUSCULAR | Status: AC | PRN
  Administered 2023-01-15: 100 mL via INTRAVENOUS

## 2023-01-15 NOTE — Progress Notes (Addendum)
Progress Note  Primary GI:  Unassigned (Seen by Dr. 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 Provided some of the history.  Patient states she is feeling better today.  No nausea and vomiting.  Tolerating her breakfast without difficulty.   Objective   Vital signs in last 24 hours: Temp:  [97.9 F (36.6 C)-98.1 F (36.7 C)] 98.1 F (36.7 C) (06/13 0454) Pulse Rate:  [71-77] 71 (06/13 0454) Resp:  [16-20] 16 (06/13 0454) BP: (121-130)/(55-66) 130/62 (06/13 0454) SpO2:  [95 %-99 %] 99 % (06/13 0454) Last BM Date : 01/13/23 (per pt report) Last BM recorded by nurses in past 5 days Stool Type: Type 6 (Mushy consistency with ragged edges) (01/13/2023  6:00 PM)  General:   female in no acute distress  Heart:  Regular rate and rhythm; no murmurs Pulm: Clear anteriorly; no wheezing Abdomen: soft, nondistended, normal bowel sounds in all quadrants. Nontender without guarding. No organomegaly appreciated. Extremities:  No edema Neurologic:  Alert and  oriented x4;  No focal deficits.  Psych:  Cooperative. Normal mood and affect.  Intake/Output from previous day: No intake/output data recorded. Intake/Output this shift: No intake/output data recorded.  Studies/Results: US BIOPSY (LIVER)  Result Date: 01/13/2023 INDICATION: [id number] Liver mass [id number] Briefly, 72 year old female with liver masses suspicious for metastases. Previous liver mass biopsy 12/23/2022 was NEGATIVE malignancy. EXAM: ULTRASOUND GUIDED LIVER MASS BIOPSY COMPARISON:  MRI abdomen, 01/07/2023 and 12/22/2022. CT AP, 12/20/2022. IR ultrasound, 12/23/2022. MEDICATIONS: None ANESTHESIA/SEDATION: Moderate (conscious) sedation was employed during this procedure. A total of Versed 2 mg and Fentanyl 100 mcg was administered intravenously. Moderate Sedation Time: 19 minutes. The patient's level of  consciousness and vital signs were monitored continuously by radiology nursing throughout the procedure under my direct supervision. COMPLICATIONS: None immediate. PROCEDURE: Informed written consent was obtained from the patient and/or patient's representative after a discussion of the risks, benefits and alternatives to treatment. The patient understands and consents the procedure. A timeout was performed prior to the initiation of the procedure. Ultrasound scanning was performed of the right upper abdominal quadrant demonstrates ill-defined, heterogeneous inferior RIGHT hepatic lobe mass The RIGHT flank was selected for biopsy and the procedure was planned. The right upper abdominal quadrant was prepped and draped in the usual sterile fashion. The overlying soft tissues were anesthetized with 1% lidocaine with epinephrine. A 17 gauge, 6.8 cm co-axial needle was advanced into a peripheral aspect of the lesion. This was followed by 5 core biopsies with an 18 gauge core device under direct ultrasound guidance. The coaxial needle tract was embolized with a small amount of Gel-Foam slurry and superficial hemostasis was obtained with manual compression. Post procedural scanning was negative for definitive area of hemorrhage or additional complication. A dressing was placed. The patient tolerated the procedure well without immediate post procedural complication. IMPRESSION: Successful ultrasound guided core needle biopsy of liver mass. Roanna Banning, MD [facility] Radiology Electronically Signed   By: Roanna Banning M.D.   On: 01/13/2023 13:53    Lab Results: Recent Labs    01/13/23 0559  WBC 7.3  HGB 12.2  HCT 36.3  PLT 166   BMET Recent Labs    01/13/23 0559 01/14/23 0553  NA 131* 132*  K 3.8 3.5  CL 99 100  CO2 23 23  GLUCOSE 142* 186*  BUN 11  13  CREATININE 0.73 0.85  CALCIUM 7.9* 7.7*   LFT Recent Labs    01/14/23 0553  PROT 6.2*  ALBUMIN  2.1*  AST 49*  ALT 29  ALKPHOS 120  BILITOT 14.3*   PT/INR No results for input(s): "LABPROT", "INR" in the last 72 hours.   Scheduled Meds:  acidophilus  1 capsule Oral Daily   amLODipine  10 mg Oral Daily   enoxaparin (LOVENOX) injection  50 mg Subcutaneous Q24H   famotidine  20 mg Oral BID   indomethacin  100 mg Rectal Once   insulin aspart  0-15 Units Subcutaneous TID WC   insulin aspart  0-5 Units Subcutaneous QHS   insulin glargine-yfgn  10 Units Subcutaneous Daily   lisinopril  20 mg Oral Daily   pantoprazole  40 mg Oral BID AC   Ensure Max Protein  11 oz Oral BID   simethicone  80 mg Oral QID   sucralfate  1 g Oral TID WC & HS   Continuous Infusions:  ciprofloxacin 400 mg (01/15/23 0520)      Patient profile:   72 year old female with 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    -Liver biopsy nondiagnostic for malignant cells -EUS with FNA of peripancreatic and portal lns negative    REPEAT MRCP 6/5: Liver mets.  Mild intrahepatic bile duct dilation with termination of converging intrahepatic bile loops suggesting underlying central obstructing lesion versus mass effect from enlarged porta hepatic lymph nodes.  Gallstones with gallbladder wall thickening.  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 Could be due to extrinsic compression. S/p brushings. S/p insertion of left hepatic duct stent. 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 -CEA 18.9 -AFP 10.2 -CA 19-9 33,659 - ERCP 6/10: prior biliary sphincterotomy open, patent stent in biliary tree  (removed and sent for cytology). Multiple biliary strictures - treated with HurriCaine balloon. Left main hepatic duct and right main hepatic duct moderately dilated. One stent removed from bilary tree.  Biliary tree swept and sludge/pus/debris were found.  Cells for cytology deemed.  Plastic biliary stent placed into the right hepatic duct and left hepatic duct.  No gross lesions in esophagus/stomach.  Mass in third/fourth portion of duodenum, biopsied, clips placed. 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 Continue to await path - will obtain labs today to check for decline in bilirubin, hopefully continuing to trend down - If bilirubin is not improving, will consider PBD by IR. - Continue supportive care  Bridger Pizzi Leanna Sato  01/15/2023, 9:12 AM

## 2023-01-15 NOTE — Progress Notes (Signed)
PIV placed. This patient has had multiple attempts with infiltrations. Veins are deep and or small. Assess bilateral arms with [place]. Tomasita Morrow, RN VAST

## 2023-01-15 NOTE — Progress Notes (Signed)
Chaplain was paged to provide support after Regina Mckay received a diagnosis.  Family was at bedside with her and they are processing together.  They did not have any needs at the moment because they have someone coming in from their community.

## 2023-01-15 NOTE — Progress Notes (Addendum)
Regina Mckay   DOB:09/20/1950   ZO#:[id number]   [id number]#:[id number]  Medical oncology follow-up note  Subjective: Patient has no new complains, still feels weak and has mild diarrhea.   Objective:  Vitals:   01/15/23 1308 01/15/23 1953  BP: 122/64 (!) 152/66  Pulse: 73 81  Resp: 18 17  Temp: 98.3 F (36.8 C) 98.1 F (36.7 C)  SpO2: 96% 96%    Body mass index is 39.44 kg/m.  Intake/Output Summary (Last 24 hours) at 01/15/2023 2328 Last data filed at 01/15/2023 1000 Gross per 24 hour  Intake 240 ml  Output --  Net 240 ml     Sclerae icteric  Oropharynx clear  No peripheral adenopathy  Lungs clear -- no rales or rhonchi  Heart regular rate and rhythm  Abdomen benign  MSK no focal spinal tenderness, no peripheral edema  Neuro nonfocal    CBG (last 3)  Recent Labs    01/15/23 1112 01/15/23 1702 01/15/23 1954  GLUCAP 149* 158* 175*     Labs:   Urine Studies No results for input(s): "UHGB", "CRYS" in the last 72 hours.  Invalid input(s): "UACOL", "UAPR", "USPG", "UPH", "UTP", "UGL", "UKET", "UBIL", "UNIT", "UROB", "ULEU", "UEPI", "UWBC", "URBC", "UBAC", "CAST", "UCOM", "BILUA"  Basic Metabolic Panel: Recent Labs  Lab 01/11/23 1112 01/12/23 0632 01/13/23 0559 01/14/23 0553 01/15/23 1006  NA 133* 134* 131* 132* 135  K 3.9 4.1 3.8 3.5 3.5  CL 100 101 99 100 102  CO2 26 23 23 23 23   GLUCOSE 168* 138* 142* 186* 179*  BUN 11 7* 11 13 11   CREATININE 0.49 0.63 0.73 0.85 0.85  CALCIUM 7.9* 7.9* 7.9* 7.7* 8.1*   GFR Estimated Creatinine Clearance: 66.8 mL/min (by C-G formula based on SCr of 0.85 mg/dL). Liver Function Tests: Recent Labs  Lab 01/11/23 1112 01/12/23 0632 01/13/23 0559 01/14/23 0553 01/15/23 1006  AST 63* 49* 44* 49* 50*  ALT 29 30 27 29 27   ALKPHOS 134* 117 125 120 138*  BILITOT 16.5* 15.7* 15.1* 14.3* 15.8*  PROT 6.5 6.2* 5.7* 6.2* 6.4*  ALBUMIN 2.4* 2.1* 2.0* 2.1* 2.2*   No results for input(s): "LIPASE", "AMYLASE" in the last 168  hours. No results for input(s): "AMMONIA" in the last 168 hours. Coagulation profile No results for input(s): "INR", "PROTIME" in the last 168 hours.   CBC: Recent Labs  Lab 01/10/23 0649 01/11/23 0631 01/13/23 0559 01/15/23 1006  WBC 7.2 8.7 7.3 7.5  NEUTROABS 5.7 7.3  --   --   HGB 12.7 12.7 12.2 13.0  HCT 37.8 37.6 36.3 38.6  MCV 87.7 87.0 86.4 87.7  PLT 203 198 166 205   Cardiac Enzymes: No results for input(s): "CKTOTAL", "CKMB", "CKMBINDEX", "TROPONINI" in the last 168 hours. BNP: Invalid input(s): "POCBNP" CBG: Recent Labs  Lab 01/14/23 2042 01/15/23 0729 01/15/23 1112 01/15/23 1702 01/15/23 1954  GLUCAP 176* 137* 149* 158* 175*   D-Dimer No results for input(s): "DDIMER" in the last 72 hours. Hgb A1c No results for input(s): "HGBA1C" in the last 72 hours. Lipid Profile No results for input(s): "CHOL", "HDL", "LDLCALC", "TRIG", "CHOLHDL", "LDLDIRECT" in the last 72 hours. Thyroid function studies No results for input(s): "TSH", "T4TOTAL", "T3FREE", "THYROIDAB" in the last 72 hours.  Invalid input(s): "FREET3" Anemia work up No results for input(s): "VITAMINB12", "FOLATE", "FERRITIN", "TIBC", "IRON", "RETICCTPCT" in the last 72 hours. Microbiology Recent Results (from the past 240 hour(s))  Gastrointestinal Panel by PCR , Stool     Status: None  Collection Time: 01/07/23 12:00 PM  Result Value Ref Range Status   Campylobacter species NOT DETECTED NOT DETECTED Final   Plesimonas shigelloides NOT DETECTED NOT DETECTED Final   Salmonella species NOT DETECTED NOT DETECTED Final   Yersinia enterocolitica NOT DETECTED NOT DETECTED Final   Vibrio species NOT DETECTED NOT DETECTED Final   Vibrio cholerae NOT DETECTED NOT DETECTED Final   Enteroaggregative E coli (EAEC) NOT DETECTED NOT DETECTED Final   Enteropathogenic E coli (EPEC) NOT DETECTED NOT DETECTED Final   Enterotoxigenic E coli (ETEC) NOT DETECTED NOT DETECTED Final   Shiga like toxin producing E  coli (STEC) NOT DETECTED NOT DETECTED Final   Shigella/Enteroinvasive E coli (EIEC) NOT DETECTED NOT DETECTED Final   Cryptosporidium NOT DETECTED NOT DETECTED Final   Cyclospora cayetanensis NOT DETECTED NOT DETECTED Final   Entamoeba histolytica NOT DETECTED NOT DETECTED Final   Giardia lamblia NOT DETECTED NOT DETECTED Final   Adenovirus F40/41 NOT DETECTED NOT DETECTED Final   Astrovirus NOT DETECTED NOT DETECTED Final   Norovirus GI/GII NOT DETECTED NOT DETECTED Final   Rotavirus A NOT DETECTED NOT DETECTED Final   Sapovirus (I, II, IV, and V) NOT DETECTED NOT DETECTED Final    Comment: Performed at [facility], [address] [place], [place] [address]  C difficile quick screen w PCR reflex     Status: None   Collection Time: 01/07/23 12:06 PM   Specimen: STOOL  Result Value Ref Range Status   C Diff antigen NEGATIVE NEGATIVE Final   C Diff toxin NEGATIVE NEGATIVE Final   C Diff interpretation No C. difficile detected.  Final    Comment: Performed at [facility], [address], [place], [place] [address]      Studies:  CT ABDOMEN PELVIS W CONTRAST  Result Date: 01/15/2023 CLINICAL DATA:  Biliary obstruction. Possible biliary drain placement. EXAM: CT ABDOMEN AND PELVIS WITH CONTRAST TECHNIQUE: Multidetector CT imaging of the abdomen and pelvis was performed using the standard protocol following bolus administration of intravenous contrast. RADIATION DOSE REDUCTION: This exam was performed according to the departmental dose-optimization program which includes automated exposure control, adjustment of the mA and/or kV according to patient size and/or use of iterative reconstruction technique. CONTRAST:  OMNIPAQUE IOHEXOL 300 MG/ML  SOLN COMPARISON:  CT 12/20/2022, MRI 01/07/2023 FINDINGS: Lower chest: Bandlike atelectasis in the left lower lobe. Trace right pleural effusion and atelectasis. Hepatobiliary: Patient's known low-density liver lesions  are better assessed on MRI. This includes the question centrally obstructing lesion on MRI, better assessed on that exam. Small amount of perihepatic ascites. Common bile duct stent in place, the common bile duct is decompressed. There is mild central intrahepatic biliary ductal dilatation of the central right and left lobe. Small focus of pneumobilia centrally. Trace perihepatic ascites. Multiple gallstones. Gallbladder wall thickening nonspecific. Pancreas: No ductal dilatation or inflammation. Spleen: Normal in size without focal abnormality. Adrenals/Urinary Tract: No adrenal nodule. No hydronephrosis. Punctate bilateral intrarenal calculi. Small cysts in the left kidney needs no further imaging follow-up. Urinary bladder is decompressed. Stomach/Bowel: Detailed bowel assessment limited in the absence of enteric contrast as well as mild motion artifact. Possible surgical clip in the duodenum. There is wall thickening about the proximal jejunum in the left upper quadrant, series 3, image 33. Question of surgical clip in this region. No bowel obstruction. Normal appendix. Moderate colonic stool burden. No definite colonic mass. Vascular/Lymphatic: Multifocal adenopathy. There are enlarged periportal, retroperitoneal and mesenteric lymph nodes. This  includes 12 mm left mesenteric node in the region of small bowel wall thickening. Prominent retrocrural nodes. Aortic atherosclerosis. No evidence of portal vein thrombus. Reproductive: Uterine fibroids. Quiescent left ovary. Right ovary not definitively seen. Other: Small volume perisplenic and perihepatic ascites. Mild generalized body wall edema. Musculoskeletal: Sclerosis centered at T12-L1 has the appearance of degenerative Modic endplate change. On presumed bone island in the right iliac bone. No suspicious bone lesion. IMPRESSION: 1. Common bile duct stent in place, the common bile duct is decompressed. There is mild central intrahepatic biliary ductal dilatation  of the central right and left lobe. 2. Patient's known low-density liver lesions are better assessed on MRI. This includes the question of centrally obstructing lesion on MRI, better assessed on that exam. 3. Multifocal adenopathy, similar to prior. 4. Wall thickening about the proximal jejunum in the left upper quadrant, nonspecific. Question of surgical clip in this region. 5. Cholelithiasis. Gallbladder wall thickening is nonspecific. 6. Small volume upper abdominal ascites. 7. Uterine fibroids. 8. Nonobstructing bilateral intrarenal calculi. Aortic Atherosclerosis (ICD10-I70.0). Electronically Signed   By: Narda Rutherford M.D.   On: 01/15/2023 22:28    Assessment: 72 y.o. 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  I also spoke with pathologist Dr. Reynolds Bowl.  Given her radiographic distribution of her disease, IHC study results, and negative EGD except due to anal adenoma with high-grade dysplasia, this is most consistent with intrahepatic cholangiocarcinoma.  Unfortunately she has multiple metastasis to liver, thoracic and abdominal lymph nodes and possible lung metastasis.  This is not curable disease. -Due to her hyperbilirubinemia, and poor performance status, she is currently not a candidate for chemotherapy. -Despite biliary stent placement by GI team, her hyperbilirubinemia has not much improved in the past few days.  IR has been consulted, and plan to perform PTC tomorrow. -I discussed the incurable nature and overall poor prognosis with patient and her family, if her liver function does not improve and she is not able to get the cancer treatment, her life expectancy is likely to be a few months.  Patient voiced a good  understanding, but understandably became very upset and shocked, will request chaplain visit for emotional support. -I think palliative care consult to discuss goals of care is hopeful, I will reach out to them. -discharge per primary team. She will likely go home with home palliative care, I will see her back in office next week.  I spent a total of 50 mins on her care today.    Malachy Mood, MD 01/15/2023

## 2023-01-15 NOTE — Progress Notes (Signed)
Nutrition Follow-up  DOCUMENTATION CODES:   Obesity unspecified  INTERVENTION:   -D/c Ensure Max as pt is eating 100% and declining supplement  -No other interventions at this time. Will sign off. Consult if needs arise.  NUTRITION DIAGNOSIS:   Increased nutrient needs related to acute illness as evidenced by estimated needs.  Ongoing.  GOAL:   Patient will meet greater than or equal to 90% of their needs  Progressing.  MONITOR:   PO intake, Supplement acceptance, Weight trends, Labs, I & O's  ASSESSMENT:   72 y.o. female with medical history significant for obesity, hypertension, insulin-dependent type 2 diabetes admitted to the hospital with continued weakness and elevated bilirubin.  She was admitted to [facility] 5/18 to 5/22 during which time she was diagnosed with Klebsiella bacteremia of unknown origin.  He was then again admitted to the hospital with diarrhea, weakness, and elevated T. bili of 9 from 5/28 to 5/31.  During that hospital admission, she was seen by gastroenterology, she underwent EUS with brushings and biopsies which were normal.  6/7: ERCP 6/10: ERCP 6/11: liver mass biopsy  Patient currently consuming 100% of meals. Has maintained appetite.  Not accepting Ensure Max so will discontinue.   Admission weight: 222 lbs Need updated weight for admission.  Medications: Risaquad, Pepcid, Carafate  Labs reviewed: CBGs: 137-188   Diet Order:   Diet Order             Diet Heart Room service appropriate? Yes; Fluid consistency: Thin  Diet effective now                   EDUCATION NEEDS:   No education needs have been identified at this time  Skin:  Skin Assessment: Reviewed RN Assessment  Last BM:  6/13  Height:   Ht Readings from Last 1 Encounters:  01/07/23 5\' 3"  (1.6 m)    Weight:   Wt Readings from Last 1 Encounters:  01/07/23 101 kg    BMI:  Body mass index is 39.44 kg/m.  Estimated Nutritional Needs:    Kcal:  1550-1750  Protein:  75-85g  Fluid:  1.7L/day   Tilda Franco, MS, RD, LDN Inpatient Clinical Dietitian Contact information available via Amion

## 2023-01-15 NOTE — Progress Notes (Signed)
Dear Doctor: This patient has been identified as a candidate for PICC for the following reason (s): IV therapy over 48 hours, poor veins/poor circulatory system (CHF, COPD, emphysema, diabetes, steroid use, IV drug abuse, etc.), and restarts due to phlebitis and infiltration in 24 hours If you agree, please write an order for the indicated device.  Thank you for supporting the early vascular access assessment program. 

## 2023-01-15 NOTE — Progress Notes (Signed)
PROGRESS NOTE    Regina Mckay  VWU:[id number] DOB: [birth year] DOA: 01/07/2023 PCP: [name], NP   Brief Narrative:  This is a 72 year old female with a history of meningioma, hypertension, diabetes mellitus, severe esophagitis & erosive gastritis & recent Klebsiella bacteremia in May. Recent imaging reveals liver lesions suspicious for mets, retroperitoneal lymphadenopathy, pulmonary nodules, supraclavicular and mediastinal lymphadenopathy and thyroid nodules. EUS with biopy that was inconclusive. No CBD dilatation noted on imaging and EUS.  She was seen by oncology on 6/5 and noted to have an increasing bilirubin level and was admitted for hyperbilirubinemia, nausea,and  weakness. ERCP x 2 in hospital- found to have biliary strictures that have been stented.  Imaging also concerning for duodenal mass.   Multiple biopsy, fine-needle aspiration have returned negative from pathology.  Bilirubin continues to downtrend appropriately albeit slowly.  Symptoms appear to be minimal per patient and daughter at bedside.  Assessment & Plan:   Principal Problem:   Hyperbilirubinemia Active Problems:   Bacteremia due to Klebsiella pneumoniae   Class 3 severe obesity with serious comorbidity and body mass index (BMI) of 40.0 to 44.9 in adult [facility])   DM (diabetes mellitus), type 2 with ophthalmic complications (HCC)   Liver masses   Obstructive jaundice   Liver lesion, right lobe   History of ERCP   History of biliary duct stent placement   Duodenal adenoma   Hyperbilirubinemia -likely obstructive given multiple liver masses, duodenal mass, and biliary stricture - Repeat liver biopsy 01/13/23: 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 - Patient also has bulky abdominal adenopathy, lung nodules and thyroid nodules noted on imaging -Tumor marker elevation: CEA 18.9, CA 19-9 33K, AFP 10.2 - 6/7 ERCP - 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   History of hepatitis - Most likely hepatitis C given positive antibody testing, noted to be previously treated per patient - HCV RNA testing: not detected   Esophagitis and erosive gastritis - Continue PPI - Sucralfate started in the hospital  - Continue additional Pepcid  Diarrhea, intractable - Unclear etiology - does not appear infectious. Possibly in the setting of above biliary obstruction vs antibiotic administration - Imodium PRN loose stool   Elevated Cr/ AKI resolved - Cr .80> 1.20 with poor intake   History of recent bacteremia due to Klebsiella pneumoniae - Resolved - source may have been biliary at that time as well -Completed 14 days of Augmentin   Class 3 severe obesity with serious comorbidity and body mass index (BMI) of 40.0 to 44.9 in adult [facility]) Body mass index is 39.44 kg/m.   IDDM (diabetes mellitus), type 2 with ophthalmic complications (HCC), well controlled - Carb modified diet, Semglee and NovoLog sliding scale ordered - A1C 5.8   Hypertension Moderately well-controlled, continue amlodipine and lisinopril   Thyroid nodules, previously noted - noted to have a multinodular goiter on [place] on 5/28- repeat US recommended in 1 yr - last TSH 1.148   DVT prophylaxis: SCDs Start: 01/07/23  1354 Code Status:   Code Status: Full Code Family Communication: Daughter at bedside  Status is: Inpatient  Dispo: The patient is from: Home              Anticipated d/c is to: To be  determined, likely home              Anticipated d/c date is: To be determined              Patient currently not medically stable for discharge given advanced disease need for ongoing imaging, likely procedure possible intervention in the setting of biliary tree obstruction concerning for metastatic cancer.  Consultants:  GI, IR  Procedures:  Upper endoscopy ultrasound, ERCP with stent placement, repeat ERCP with stent removal and 2 stent placements  Antimicrobials:  As above ciprofloxacin stop date 01/16/2023  Subjective: No acute issues or events overnight, patient indicates she rested well, feels moderately improved from prior.  Diarrhea appears to be improving but not resolved.  Denies chest pain shortness of breath constipation nausea vomiting headache fevers or chills.  Objective: Vitals:   01/14/23 0507 01/14/23 1424 01/14/23 2032 01/15/23 0454  BP: (!) 143/63 121/66 (!) 126/55 130/62  Pulse: 75 77 71 71  Resp: 16 20 16 16   Temp: (!) 97.4 F (36.3 C) 97.9 F (36.6 C) 98 F (36.7 C) 98.1 F (36.7 C)  TempSrc: Oral Oral Oral Oral  SpO2: 96% 98% 95% 99%  Weight:      Height:       No intake or output data in the 24 hours ending 01/15/23 0721  Filed Weights   01/07/23 1329  Weight: 101 kg    Examination:  General:  Pleasantly resting in bed, No acute distress. HEENT:  Normocephalic atraumatic.  Sclerae nonicteric, noninjected.  Extraocular movements intact bilaterally. Neck:  Without mass or deformity.  Trachea is midline. Lungs:  Without rhonchi, wheeze, or rales. Heart: Without murmurs, rubs, or gallops. Abdomen:  Soft, nontender, nondistended. Extremities: Without cyanosis, clubbing Skin:  Warm and dry, no erythema  Data Reviewed: I have personally reviewed following labs and imaging studies  CBC: Recent Labs  Lab 01/10/23 0649 01/11/23 0631 01/13/23 0559  WBC 7.2 8.7 7.3  NEUTROABS 5.7 7.3  --   HGB 12.7 12.7 12.2  HCT 37.8 37.6 36.3  MCV 87.7 87.0  86.4  PLT 203 198 166    Basic Metabolic Panel: Recent Labs  Lab 01/10/23 1427 01/11/23 1112 01/12/23 0632 01/13/23 0559 01/14/23 0553  NA 134* 133* 134* 131* 132*  K 4.2 3.9 4.1 3.8 3.5  CL 97* 100 101 99 100  CO2 22 26 23 23 23   GLUCOSE 167* 168* 138* 142* 186*  BUN 15 11 7* 11 13  CREATININE 0.77 0.49 0.63 0.73 0.85  CALCIUM 8.6* 7.9* 7.9* 7.9* 7.7*    GFR: Estimated Creatinine Clearance: 66.8 mL/min (by C-G formula based on SCr of 0.85 mg/dL). Liver Function Tests: Recent Labs  Lab 01/10/23 1427 01/11/23 1112 01/12/23 0632 01/13/23 0559 01/14/23 0553  AST 68* 63* 49* 44* 49*  ALT 37 29 30 27 29   ALKPHOS 152* 134* 117 125 120  BILITOT 17.2* 16.5* 15.7* 15.1* 14.3*  PROT 6.8 6.5 6.2* 5.7* 6.2*  ALBUMIN 2.6* 2.4* 2.1* 2.0* 2.1*    No results for input(s): "LIPASE", "AMYLASE" in the last 168 hours. No results for input(s): "AMMONIA" in the last 168 hours. Coagulation Profile: No results for input(s): "INR", "PROTIME" in the last 168 hours.  Cardiac  Enzymes: No results for input(s): "CKTOTAL", "CKMB", "CKMBINDEX", "TROPONINI" in the last 168 hours. BNP (last 3 results) No results for input(s): "PROBNP" in the last 8760 hours. HbA1C: No results for input(s): "HGBA1C" in the last 72 hours. CBG: Recent Labs  Lab 01/13/23 2104 01/14/23 0746 01/14/23 1211 01/14/23 1802 01/14/23 2042  GLUCAP 175* 160* 157* 188* 176*    Lipid Profile: No results for input(s): "CHOL", "HDL", "LDLCALC", "TRIG", "CHOLHDL", "LDLDIRECT" in the last 72 hours. Thyroid Function Tests: No results for input(s): "TSH", "T4TOTAL", "FREET4", "T3FREE", "THYROIDAB" in the last 72 hours. Anemia Panel: No results for input(s): "VITAMINB12", "FOLATE", "FERRITIN", "TIBC", "IRON", "RETICCTPCT" in the last 72 hours. Sepsis Labs: No results for input(s): "PROCALCITON", "LATICACIDVEN" in the last 168 hours.  Recent Results (from the past 240 hour(s))  Gastrointestinal Panel by PCR , Stool      Status: None   Collection Time: 01/07/23 12:00 PM  Result Value Ref Range Status   Campylobacter species NOT DETECTED NOT DETECTED Final   Plesimonas shigelloides NOT DETECTED NOT DETECTED Final   Salmonella species NOT DETECTED NOT DETECTED Final   Yersinia enterocolitica NOT DETECTED NOT DETECTED Final   Vibrio species NOT DETECTED NOT DETECTED Final   Vibrio cholerae NOT DETECTED NOT DETECTED Final   Enteroaggregative E coli (EAEC) NOT DETECTED NOT DETECTED Final   Enteropathogenic E coli (EPEC) NOT DETECTED NOT DETECTED Final   Enterotoxigenic E coli (ETEC) NOT DETECTED NOT DETECTED Final   Shiga like toxin producing E coli (STEC) NOT DETECTED NOT DETECTED Final   Shigella/Enteroinvasive E coli (EIEC) NOT DETECTED NOT DETECTED Final   Cryptosporidium NOT DETECTED NOT DETECTED Final   Cyclospora cayetanensis NOT DETECTED NOT DETECTED Final   Entamoeba histolytica NOT DETECTED NOT DETECTED Final   Giardia lamblia NOT DETECTED NOT DETECTED Final   Adenovirus F40/41 NOT DETECTED NOT DETECTED Final   Astrovirus NOT DETECTED NOT DETECTED Final   Norovirus GI/GII NOT DETECTED NOT DETECTED Final   Rotavirus A NOT DETECTED NOT DETECTED Final   Sapovirus (I, II, IV, and V) NOT DETECTED NOT DETECTED Final    Comment: Performed at [facility], [address] [place], [place] [address]  C difficile quick screen w PCR reflex     Status: None   Collection Time: 01/07/23 12:06 PM   Specimen: STOOL  Result Value Ref Range Status   C Diff antigen NEGATIVE NEGATIVE Final   C Diff toxin NEGATIVE NEGATIVE Final   C Diff interpretation No C. difficile detected.  Final    Comment: Performed at [facility], [address], [place], [place] [address]         Radiology Studies: US BIOPSY (LIVER)  Result Date: 01/13/2023 INDICATION: [id number] Liver mass [id number] Briefly, 72 year old female with liver masses suspicious for metastases. Previous liver mass biopsy 12/23/2022  was NEGATIVE malignancy. EXAM: ULTRASOUND GUIDED LIVER MASS BIOPSY COMPARISON:  MRI abdomen, 01/07/2023 and 12/22/2022. CT AP, 12/20/2022. IR ultrasound, 12/23/2022. MEDICATIONS: None ANESTHESIA/SEDATION: Moderate (conscious) sedation was employed during this procedure. A total of Versed 2 mg and Fentanyl 100 mcg was administered intravenously. Moderate Sedation Time: 19 minutes. The patient's level of consciousness and vital signs were monitored continuously by radiology nursing throughout the procedure under my direct supervision. COMPLICATIONS: None immediate. PROCEDURE: Informed written consent was obtained from the patient and/or patient's representative after a discussion of the risks, benefits and alternatives to treatment. The patient understands and consents the procedure. A timeout was performed prior to the initiation of  the procedure. Ultrasound scanning was performed of the right upper abdominal quadrant demonstrates ill-defined, heterogeneous inferior RIGHT hepatic lobe mass The RIGHT flank was selected for biopsy and the procedure was planned. The right upper abdominal quadrant was prepped and draped in the usual sterile fashion. The overlying soft tissues were anesthetized with 1% lidocaine with epinephrine. A 17 gauge, 6.8 cm co-axial needle was advanced into a peripheral aspect of the lesion. This was followed by 5 core biopsies with an 18 gauge core device under direct ultrasound guidance. The coaxial needle tract was embolized with a small amount of Gel-Foam slurry and superficial hemostasis was obtained with manual compression. Post procedural scanning was negative for definitive area of hemorrhage or additional complication. A dressing was placed. The patient tolerated the procedure well without immediate post procedural complication. IMPRESSION: Successful ultrasound guided core needle biopsy of liver mass. Roanna Banning, MD [facility]  Electronically Signed   By: Roanna Banning M.D.   On: 01/13/2023 13:53    Scheduled Meds:  acidophilus  1 capsule Oral Daily   amLODipine  10 mg Oral Daily   enoxaparin (LOVENOX) injection  50 mg Subcutaneous Q24H   famotidine  20 mg Oral BID   indomethacin  100 mg Rectal Once   insulin aspart  0-15 Units Subcutaneous TID WC   insulin aspart  0-5 Units Subcutaneous QHS   insulin glargine-yfgn  10 Units Subcutaneous Daily   lisinopril  20 mg Oral Daily   pantoprazole  40 mg Oral BID AC   Ensure Max Protein  11 oz Oral BID   simethicone  80 mg Oral QID   sucralfate  1 g Oral TID WC & HS   Continuous Infusions:  ciprofloxacin 400 mg (01/15/23 0520)     LOS: 8 days   Time spent:  Azucena Fallen, DO [facility]  If 7PM-7AM, please contact night-coverage [email or web address]  01/15/2023, 7:21 AM

## 2023-01-16 ENCOUNTER — Inpatient Hospital Stay (HOSPITAL_COMMUNITY): Payer: Medicare Other

## 2023-01-16 HISTORY — PX: IR BILIARY DRAIN PLACEMENT WITH CHOLANGIOGRAM: IMG6043

## 2023-01-16 LAB — CBC WITH DIFFERENTIAL/PLATELET
Abs Immature Granulocytes: 0.06 10*3/uL (ref 0.00–0.07)
Basophils Absolute: 0 10*3/uL (ref 0.0–0.1)
Basophils Relative: 0 %
Eosinophils Absolute: 0.1 10*3/uL (ref 0.0–0.5)
Eosinophils Relative: 1 %
HCT: 36.6 % (ref 36.0–46.0)
Hemoglobin: 12.5 g/dL (ref 12.0–15.0)
Immature Granulocytes: 1 %
Lymphocytes Relative: 8 %
Lymphs Abs: 0.7 10*3/uL (ref 0.7–4.0)
MCH: 29.8 pg (ref 26.0–34.0)
MCHC: 34.2 g/dL (ref 30.0–36.0)
MCV: 87.1 fL (ref 80.0–100.0)
Monocytes Absolute: 1.4 10*3/uL — ABNORMAL HIGH (ref 0.1–1.0)
Monocytes Relative: 16 %
Neutro Abs: 6.2 10*3/uL (ref 1.7–7.7)
Neutrophils Relative %: 74 %
Platelets: 202 10*3/uL (ref 150–400)
RBC: 4.2 MIL/uL (ref 3.87–5.11)
RDW: 17.6 % — ABNORMAL HIGH (ref 11.5–15.5)
WBC: 8.5 10*3/uL (ref 4.0–10.5)
nRBC: 0 % (ref 0.0–0.2)

## 2023-01-16 LAB — COMPREHENSIVE METABOLIC PANEL
ALT: 26 U/L (ref 0–44)
AST: 48 U/L — ABNORMAL HIGH (ref 15–41)
Albumin: 2.1 g/dL — ABNORMAL LOW (ref 3.5–5.0)
Alkaline Phosphatase: 127 U/L — ABNORMAL HIGH (ref 38–126)
Anion gap: 8 (ref 5–15)
BUN: 10 mg/dL (ref 8–23)
CO2: 26 mmol/L (ref 22–32)
Calcium: 7.8 mg/dL — ABNORMAL LOW (ref 8.9–10.3)
Chloride: 101 mmol/L (ref 98–111)
Creatinine, Ser: 0.83 mg/dL (ref 0.44–1.00)
GFR, Estimated: >60 mL/min (ref >60)
Glucose, Bld: 147 mg/dL — ABNORMAL HIGH (ref 70–99)
Potassium: 3.5 mmol/L (ref 3.5–5.1)
Sodium: 135 mmol/L (ref 135–145)
Total Bilirubin: 15.4 mg/dL — ABNORMAL HIGH (ref 0.3–1.2)
Total Protein: 5.8 g/dL — ABNORMAL LOW (ref 6.5–8.1)

## 2023-01-16 LAB — GLUCOSE, CAPILLARY
Glucose-Capillary: 132 mg/dL — ABNORMAL HIGH (ref 70–99)
Glucose-Capillary: 114 mg/dL — ABNORMAL HIGH (ref 70–99)
Glucose-Capillary: 122 mg/dL — ABNORMAL HIGH (ref 70–99)
Glucose-Capillary: 130 mg/dL — ABNORMAL HIGH (ref 70–99)

## 2023-01-16 LAB — PROTIME-INR
INR: 1.4 — ABNORMAL HIGH (ref 0.8–1.2)
Prothrombin Time: 17.4 seconds — ABNORMAL HIGH (ref 11.4–15.2)

## 2023-01-16 MED ORDER — FENTANYL CITRATE (PF) 100 MCG/2ML IJ SOLN
INTRAMUSCULAR | Status: AC | PRN
  Administered 2023-01-16 (×4): 50 ug via INTRAVENOUS

## 2023-01-16 MED ORDER — SODIUM CHLORIDE 0.9 % IV SOLN: INTRAVENOUS | Status: DC

## 2023-01-16 MED ORDER — LEVOFLOXACIN IN D5W 500 MG/100ML IV SOLN
INTRAVENOUS | Status: AC | PRN
  Administered 2023-01-16: 500 mg via INTRAVENOUS

## 2023-01-16 MED ORDER — MIDAZOLAM HCL 2 MG/2ML IJ SOLN
INTRAMUSCULAR | Status: AC
  Filled 2023-01-16: qty 2

## 2023-01-16 MED ORDER — LEVOFLOXACIN IN D5W 500 MG/100ML IV SOLN
500.0000 mg | INTRAVENOUS | Status: DC
  Filled 2023-01-16: qty 100

## 2023-01-16 MED ORDER — IOHEXOL 300 MG/ML  SOLN
50.0000 mL | Freq: Once | INTRAMUSCULAR | Status: AC | PRN
  Administered 2023-01-16: 15 mL

## 2023-01-16 MED ORDER — FENTANYL CITRATE (PF) 100 MCG/2ML IJ SOLN
INTRAMUSCULAR | Status: AC
  Filled 2023-01-16: qty 2

## 2023-01-16 MED ORDER — MIDAZOLAM HCL 2 MG/2ML IJ SOLN
INTRAMUSCULAR | Status: AC | PRN
  Administered 2023-01-16 (×4): 1 mg via INTRAVENOUS

## 2023-01-16 MED ORDER — LIDOCAINE HCL 1 % IJ SOLN
INTRAMUSCULAR | Status: AC
  Filled 2023-01-16: qty 20

## 2023-01-16 NOTE — Procedures (Signed)
Interventional Radiology Procedure:   Indications: Cholangiocarcinoma with elevated bilirubin  Procedure: Attempted PTC and drain placement - unsuccessful  Findings: Minimal biliary dilatation in left hepatic lobe.  Multiple attempts were made to opacify the biliary system.  It appeared that very small bile ducts were visualized but could not opacify the central bile ducts. No significant dilatation in right hepatic lobe.   Complications: None     EBL: Minimal  Plan: Follow labs and consider another attempt in the future.   Regina Whitmire R. Lowella Dandy, MD  Pager: [phone]

## 2023-01-16 NOTE — Progress Notes (Signed)
Referring Physician(s): Feng,Yan/Gessner,C  Supervising Physician: Henn,A  Patient Status:  [facility] - In-pt  Chief Complaint: Weakness, jaundice, nausea, metastatic cholangiocarcinoma   Subjective: Pt known to IR team from liver mass bx on 12/23/22 and on 01/13/23. She is a 72 yo female with hx DM, HTN, hep C antibody reactive and hyperbilirubinemia. 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  Despite recent biliary stent placement by GI patient continues to have hyperbilirubinemia, currently with T. bili of 15.4.  Latest CT abdomen pelvis performed yesterday revealed:  1. Common bile duct stent in place, the common bile duct is decompressed. There is mild central intrahepatic biliary ductal dilatation of the central right and left lobe. 2. Patient's known low-density liver lesions are better assessed on MRI. This includes the question of centrally obstructing lesion on MRI, better assessed on that exam. 3. Multifocal adenopathy, similar to prior. 4. Wall thickening about the proximal jejunum in the left upper quadrant, nonspecific. Question of surgical clip in this region. 5. Cholelithiasis. Gallbladder wall thickening is nonspecific. 6. Small volume upper abdominal ascites. 7. Uterine fibroids. 8. Nonobstructing bilateral intrarenal calculi.   Aortic Atherosclerosis  Request now received from GI team for percutaneous transhepatic cholangiogram with biliary drain placement.  She is afebrile, WBC normal, hemoglobin normal, platelets normal, PT 17.4, INR 1.4 creatinine normal.  She denies fever, headache, chest pain, dyspnea, cough, abdominal pain, back pain, or bleeding. She does have some fatigue, jaundice, intermittent nausea /vomiting, some loose stools.   Past Medical History:  Diagnosis Date   Diabetes mellitus without complication (HCC)    Hypertension    Past Surgical  History:  Procedure Laterality Date   APPLICATION OF CRANIAL NAVIGATION N/A 10/11/2020   Procedure: APPLICATION OF CRANIAL NAVIGATION;  Surgeon: Bedelia Person, MD;  Location: [facility] OR;  Service: Neurosurgery;  Laterality: N/A;   BILIARY BRUSHING  01/09/2023   Procedure: BILIARY BRUSHING;  Surgeon: Lynann Bologna, MD;  Location: [facility];  Service: Gastroenterology;;   BILIARY BRUSHING  01/12/2023   Procedure: BILIARY BRUSHING;  Surgeon: [name]., MD;  Location: [facility];  Service: Gastroenterology;;   BILIARY DILATION  01/12/2023   Procedure: BILIARY DILATION;  Surgeon: [name]., MD;  Location: [facility];  Service: Gastroenterology;;   BILIARY STENT PLACEMENT N/A 01/09/2023   Procedure: BILIARY STENT PLACEMENT;  Surgeon: Lynann Bologna, MD;  Location: [facility];  Service: Gastroenterology;  Laterality: N/A;   BILIARY STENT PLACEMENT N/A 01/12/2023   Procedure: BILIARY STENT PLACEMENT;  Surgeon: Meridee Score Netty Starring., MD;  Location: [facility];  Service: Gastroenterology;  Laterality: N/A;   BIOPSY  01/01/2023   Procedure: BIOPSY;  Surgeon: Meridee Score Netty Starring., MD;  Location: [facility];  Service: Gastroenterology;;   BIOPSY  01/12/2023   Procedure: BIOPSY;  Surgeon: [name]., MD;  Location: [facility];  Service: Gastroenterology;;   CRANIOTOMY N/A 10/11/2020   Procedure: CRANIOTOMY FOR RESECTION OF MENINGIOMA;  Surgeon: Bedelia Person, MD;  Location: [facility] OR;  Service: Neurosurgery;  Laterality: N/A;   ERCP N/A 01/09/2023   Procedure: ENDOSCOPIC RETROGRADE CHOLANGIOPANCREATOGRAPHY (ERCP);  Surgeon: Lynann Bologna, MD;  Location: [facility];  Service: Gastroenterology;  Laterality: N/A;   ERCP N/A 01/12/2023   Procedure: ENDOSCOPIC RETROGRADE CHOLANGIOPANCREATOGRAPHY (ERCP);  Surgeon: [name]., MD;  Location: [facility];  Service: Gastroenterology;  Laterality: N/A;   ESOPHAGOGASTRODUODENOSCOPY (EGD) WITH PROPOFOL N/A  01/01/2023   Procedure: ESOPHAGOGASTRODUODENOSCOPY (EGD)  WITH PROPOFOL;  Surgeon: Mansouraty, Netty Starring., MD;  Location: [facility];  Service: Gastroenterology;  Laterality: N/A;   ESOPHAGOGASTRODUODENOSCOPY (EGD) WITH PROPOFOL N/A 01/12/2023   Procedure: ESOPHAGOGASTRODUODENOSCOPY (EGD) WITH PROPOFOL;  Surgeon: Meridee Score Netty Starring., MD;  Location: [facility];  Service: Gastroenterology;  Laterality: N/A;   EUS N/A 01/01/2023   Procedure: UPPER ENDOSCOPIC ULTRASOUND (EUS) LINEAR;  Surgeon: [name]., MD;  Location: [facility];  Service: Gastroenterology;  Laterality: N/A;   FINE NEEDLE ASPIRATION N/A 01/01/2023   Procedure: FINE NEEDLE ASPIRATION (FNA) LINEAR;  Surgeon: [name]., MD;  Location: [facility];  Service: Gastroenterology;  Laterality: N/A;   HEMOSTASIS CLIP PLACEMENT  01/12/2023   Procedure: HEMOSTASIS CLIP PLACEMENT;  Surgeon: [name]., MD;  Location: [facility];  Service: Gastroenterology;;   REMOVAL OF STONES  01/09/2023   Procedure: REMOVAL OF STONES;  Surgeon: Lynann Bologna, MD;  Location: [facility];  Service: Gastroenterology;;   REMOVAL OF STONES  01/12/2023   Procedure: REMOVAL OF SLUDGE;  Surgeon: [name]., MD;  Location: [facility];  Service: Gastroenterology;;   REPLACEMENT TOTAL KNEE     SPHINCTEROTOMY  01/09/2023   Procedure: Dennison Mascot;  Surgeon: Lynann Bologna, MD;  Location: [facility];  Service: Gastroenterology;;   Francine Graven REMOVAL  01/12/2023   Procedure: STENT REMOVAL;  Surgeon: [name]., MD;  Location: [facility];  Service: Gastroenterology;;   SUBMUCOSAL TATTOO INJECTION  01/12/2023   Procedure: SUBMUCOSAL TATTOO INJECTION;  Surgeon: [name]., MD;  Location: [facility];  Service: Gastroenterology;;   TONSILLECTOMY       Allergies: Augmentin [amoxicillin-pot clavulanate] and Lidoderm [lidocaine]  Medications: Prior to Admission medications   Medication  Sig Start Date End Date Taking? Authorizing Provider  amLODipine (NORVASC) 10 MG tablet Take 1 tablet (10 mg total) by mouth daily. Follow-up appt due in July must see provider for future refills Patient taking differently: Take 10 mg by mouth daily. 11/06/22  Yes [name], NP  bismuth subsalicylate (PEPTO BISMOL) 262 MG chewable tablet Chew 524 mg by mouth as needed for indigestion or diarrhea or loose stools.   Yes [provider]  cholecalciferol (VITAMIN D3) 10 MCG (400 UNIT) TABS tablet Take 400 Units by mouth daily.   Yes [provider]  Continuous Blood Gluc Sensor (FREESTYLE LIBRE 2 SENSOR) MISC USE 1 SENSOR EVERY 14 DAYS AS DIRECTED Patient taking differently: Inject 1 Device into the skin every 14 (fourteen) days. 09/30/22  Yes Carlus Pavlov, MD  hydrochlorothiazide (MICROZIDE) 12.5 MG capsule Take 1 capsule (12.5 mg total) by mouth daily. 05/28/22  Yes [name], NP  insulin aspart (NOVOLOG FLEXPEN) 100 UNIT/ML FlexPen Inject 5-7 units under skin up to 2x a day Patient taking differently: Inject 7-9 Units into the skin See admin instructions. Inject 7-9 into the skin twice a day 15 minutes before meals 05/06/22  Yes Carlus Pavlov, MD  Insulin Glargine (BASAGLAR KWIKPEN) 100 UNIT/ML Inject 22 Units into the skin daily. Patient taking differently: Inject 22 Units into the skin in the morning. 05/06/22  Yes Carlus Pavlov, MD  lisinopril (ZESTRIL) 20 MG tablet Take 1 tablet (20 mg total) by mouth daily. Follow-up appt due in July Patient taking differently: Take 20 mg by mouth daily. 11/17/22  Yes [name], NP  ondansetron (ZOFRAN) 4 MG tablet Take 1 tablet (4 mg total) by mouth every 8 (eight) hours as needed for nausea or vomiting. 01/02/23  Yes Rai, Delene Ruffini, MD  pantoprazole (PROTONIX)  40 MG tablet Take 1 tablet (40 mg total) by mouth 2 (two) times daily before a meal. 01/02/23  Yes Rai, Ripudeep K, MD  SYSTANE HYDRATION PF 0.4-0.3 % SOLN Place 1 drop  into both eyes 3 (three) times daily as needed (for dryness or irritation).   Yes [provider]  TYLENOL 8 HOUR ARTHRITIS PAIN 650 MG CR tablet Take 1,300 mg by mouth every 8 (eight) hours as needed for pain.   Yes [provider]  amoxicillin-clavulanate (AUGMENTIN) 875-125 MG tablet Take 1 tablet by mouth 2 (two) times daily for 14 days. Continue for 4 more days, then stop Patient not taking: Reported on 01/07/2023 01/02/23 01/16/23  Rai, Delene Ruffini, MD  Continuous Blood Gluc Receiver (FREESTYLE LIBRE 2 READER) DEVI 1 each by Does not apply route daily. 10/25/21   Carlus Pavlov, MD  Insulin Pen Needle 32G X 4 MM MISC Use 4x a day Patient taking differently: 1 each by Other route See admin instructions. Use 4x a day 09/13/21   Carlus Pavlov, MD  oxyCODONE (OXY IR/ROXICODONE) 5 MG immediate release tablet Take 0.5-1 tablets (2.5-5 mg total) by mouth every 4 (four) hours as needed for moderate pain. Patient not taking: Reported on 01/07/2023 12/24/22   Leatha Gilding, MD  Semaglutide,0.25 or 0.5MG /DOS, (OZEMPIC, 0.25 OR 0.5 MG/DOSE,) 2 MG/3ML SOPN Inject 0.25 mg into the skin once a week. Patient not taking: Reported on 01/07/2023 01/05/23   Carlus Pavlov, MD  sucralfate (CARAFATE) 1 g tablet Take 1 tablet (1 g total) by mouth 4 (four) times daily -  with meals and at bedtime. Patient not taking: Reported on 01/07/2023 01/07/23 03/08/23  Pollyann Samples, NP     Vital Signs: BP (!) 147/58 (BP Location: Left Arm)   Pulse 80   Temp 98.3 F (36.8 C) (Oral)   Resp 17   Ht 5\' 3"  (1.6 m)   Wt 222 lb 10.6 oz (101 kg)   SpO2 98%   BMI 39.44 kg/m   Physical Exam: awake/alert; scleral icterus; chest- CTA bilat; heart- RRR; abd- obese, soft,+BS,NT; no LE edema   Imaging: CT ABDOMEN PELVIS W CONTRAST  Result Date: 01/15/2023 CLINICAL DATA:  Biliary obstruction. Possible biliary drain placement. EXAM: CT ABDOMEN AND PELVIS WITH CONTRAST TECHNIQUE: Multidetector CT imaging of the  abdomen and pelvis was performed using the standard protocol following bolus administration of intravenous contrast. RADIATION DOSE REDUCTION: This exam was performed according to the departmental dose-optimization program which includes automated exposure control, adjustment of the mA and/or kV according to patient size and/or use of iterative reconstruction technique. CONTRAST:  OMNIPAQUE IOHEXOL 300 MG/ML  SOLN COMPARISON:  CT 12/20/2022, MRI 01/07/2023 FINDINGS: Lower chest: Bandlike atelectasis in the left lower lobe. Trace right pleural effusion and atelectasis. Hepatobiliary: Patient's known low-density liver lesions are better assessed on MRI. This includes the question centrally obstructing lesion on MRI, better assessed on that exam. Small amount of perihepatic ascites. Common bile duct stent in place, the common bile duct is decompressed. There is mild central intrahepatic biliary ductal dilatation of the central right and left lobe. Small focus of pneumobilia centrally. Trace perihepatic ascites. Multiple gallstones. Gallbladder wall thickening nonspecific. Pancreas: No ductal dilatation or inflammation. Spleen: Normal in size without focal abnormality. Adrenals/Urinary Tract: No adrenal nodule. No hydronephrosis. Punctate bilateral intrarenal calculi. Small cysts in the left kidney needs no further imaging follow-up. Urinary bladder is decompressed. Stomach/Bowel: Detailed bowel assessment limited in the absence of enteric contrast as well  as mild motion artifact. Possible surgical clip in the duodenum. There is wall thickening about the proximal jejunum in the left upper quadrant, series 3, image 33. Question of surgical clip in this region. No bowel obstruction. Normal appendix. Moderate colonic stool burden. No definite colonic mass. Vascular/Lymphatic: Multifocal adenopathy. There are enlarged periportal, retroperitoneal and mesenteric lymph nodes. This includes 12 mm left mesenteric node in  the region of small bowel wall thickening. Prominent retrocrural nodes. Aortic atherosclerosis. No evidence of portal vein thrombus. Reproductive: Uterine fibroids. Quiescent left ovary. Right ovary not definitively seen. Other: Small volume perisplenic and perihepatic ascites. Mild generalized body wall edema. Musculoskeletal: Sclerosis centered at T12-L1 has the appearance of degenerative Modic endplate change. On presumed bone island in the right iliac bone. No suspicious bone lesion. IMPRESSION: 1. Common bile duct stent in place, the common bile duct is decompressed. There is mild central intrahepatic biliary ductal dilatation of the central right and left lobe. 2. Patient's known low-density liver lesions are better assessed on MRI. This includes the question of centrally obstructing lesion on MRI, better assessed on that exam. 3. Multifocal adenopathy, similar to prior. 4. Wall thickening about the proximal jejunum in the left upper quadrant, nonspecific. Question of surgical clip in this region. 5. Cholelithiasis. Gallbladder wall thickening is nonspecific. 6. Small volume upper abdominal ascites. 7. Uterine fibroids. 8. Nonobstructing bilateral intrarenal calculi. Aortic Atherosclerosis (ICD10-I70.0). Electronically Signed   By: Narda Rutherford M.D.   On: 01/15/2023 22:28   US BIOPSY (LIVER)  Result Date: 01/13/2023 INDICATION: [id number] Liver mass [id number] Briefly, 72 year old female with liver masses suspicious for metastases. Previous liver mass biopsy 12/23/2022 was NEGATIVE malignancy. EXAM: ULTRASOUND GUIDED LIVER MASS BIOPSY COMPARISON:  MRI abdomen, 01/07/2023 and 12/22/2022. CT AP, 12/20/2022. IR ultrasound, 12/23/2022. MEDICATIONS: None ANESTHESIA/SEDATION: Moderate (conscious) sedation was employed during this procedure. A total of Versed 2 mg and Fentanyl 100 mcg was administered intravenously. Moderate Sedation Time: 19 minutes. The patient's level of consciousness and vital signs were  monitored continuously by radiology nursing throughout the procedure under my direct supervision. COMPLICATIONS: None immediate. PROCEDURE: Informed written consent was obtained from the patient and/or patient's representative after a discussion of the risks, benefits and alternatives to treatment. The patient understands and consents the procedure. A timeout was performed prior to the initiation of the procedure. Ultrasound scanning was performed of the right upper abdominal quadrant demonstrates ill-defined, heterogeneous inferior RIGHT hepatic lobe mass The RIGHT flank was selected for biopsy and the procedure was planned. The right upper abdominal quadrant was prepped and draped in the usual sterile fashion. The overlying soft tissues were anesthetized with 1% lidocaine with epinephrine. A 17 gauge, 6.8 cm co-axial needle was advanced into a peripheral aspect of the lesion. This was followed by 5 core biopsies with an 18 gauge core device under direct ultrasound guidance. The coaxial needle tract was embolized with a small amount of Gel-Foam slurry and superficial hemostasis was obtained with manual compression. Post procedural scanning was negative for definitive area of hemorrhage or additional complication. A dressing was placed. The patient tolerated the procedure well without immediate post procedural complication. IMPRESSION: Successful ultrasound guided core needle biopsy of liver mass. Roanna Banning, MD [facility] Electronically Signed   By: Roanna Banning M.D.   On: 01/13/2023 13:53   DG ERCP  Result Date: 01/13/2023 CLINICAL DATA:  [id number] Surgery, elective [id number] EXAM: ERCP COMPARISON:  ERCP 01/09/2023. CT AP, 12/20/2022. MRCP, 12/31/2022 and 01/07/2023. IR ultrasound,  12/23/2022. FLUOROSCOPY: Exposure Index (as provided by the fluoroscopic device): 429 mGy Kerma FINDINGS: Limited oblique planar images of the RIGHT upper quadrant obtained  C-arm. Images demonstrating flexible endoscopy, biliary duct cannulation, sphincterotomy, retrograde cholangiogram, balloon sweep and plastic biliary stent placement. No biliary ductal dilation. No evidence of biliary filling defect is demonstrated. IMPRESSION: Fluoroscopic imaging for ERCP and biliary stent placement. For complete description of intra procedural findings, please see performing service dictation. Electronically Signed   By: Roanna Banning M.D.   On: 01/13/2023 08:02   DG C-Arm 1-60 Min-No Report  Result Date: 01/12/2023 Fluoroscopy was utilized by the requesting physician.  No radiographic interpretation.    Labs:  CBC: Recent Labs    01/11/23 0631 01/13/23 0559 01/15/23 1006 01/16/23 0556  WBC 8.7 7.3 7.5 8.5  HGB 12.7 12.2 13.0 12.5  HCT 37.6 36.3 38.6 36.6  PLT 198 166 205 202    COAGS: Recent Labs    12/20/22 1308 01/08/23 0554 01/16/23 0556  INR 1.2 1.3* 1.4*  APTT 25  --   --     BMP: Recent Labs    01/13/23 0559 01/14/23 0553 01/15/23 1006 01/16/23 0556  NA 131* 132* 135 135  K 3.8 3.5 3.5 3.5  CL 99 100 102 101  CO2 23 23 23 26   GLUCOSE 142* 186* 179* 147*  BUN 11 13 11 10   CALCIUM 7.9* 7.7* 8.1* 7.8*  CREATININE 0.73 0.85 0.85 0.83  GFRNONAA >60 >60 >60 >60    LIVER FUNCTION TESTS: Recent Labs    01/13/23 0559 01/14/23 0553 01/15/23 1006 01/16/23 0556  BILITOT 15.1* 14.3* 15.8* 15.4*  AST 44* 49* 50* 48*  ALT 27 29 27 26   ALKPHOS 125 120 138* 127*  PROT 5.7* 6.2* 6.4* 5.8*  ALBUMIN 2.0* 2.1* 2.2* 2.1*    Assessment and Plan: 72 yo female with hx DM, HTN, hep C antibody reactive and hyperbilirubinemia. Previous liver bx on 5/21 was non diagnostic for malignant cells , latest liver bx performed on 6/11 revealed findings consistent with metastatic cholangiocarcinoma.  Despite recent biliary stent placement by GI patient continues to have hyperbilirubinemia, currently with T. bili of 15.4.  Latest CT abdomen pelvis performed  yesterday revealed:  1. Common bile duct stent in place, the common bile duct is decompressed. There is mild central intrahepatic biliary ductal dilatation of the central right and left lobe. 2. Patient's known low-density liver lesions are better assessed on MRI. This includes the question of centrally obstructing lesion on MRI, better assessed on that exam. 3. Multifocal adenopathy, similar to prior. 4. Wall thickening about the proximal jejunum in the left upper quadrant, nonspecific. Question of surgical clip in this region. 5. Cholelithiasis. Gallbladder wall thickening is nonspecific. 6. Small volume upper abdominal ascites. 7. Uterine fibroids. 8. Nonobstructing bilateral intrarenal calculi.   Aortic Atherosclerosis  Request now received from GI team for percutaneous transhepatic cholangiogram with biliary drain placement.  She is afebrile, WBC normal, hemoglobin normal, platelets normal, PT 17.4, INR 1.4 creatinine normal.  Imaging studies were reviewed by Dr.Henn.  Details/risks of procedure, including but not limited to, internal bleeding, infection, injury to adjacent structures with patient/family with their understanding and consent.  Procedure scheduled for this afternoon.  Electronically Signed: D. Jeananne Rama, PA-C 01/16/2023, 11:44 AM   I spent a total of 25 minutes at the the patient's bedside AND on the patient's hospital floor or unit, greater than 50% of which was counseling/coordinating care for percutaneous transhepatic cholangiogram with biliary  drain placement   Patient ID: Regina Mckay, female   DOB: 01/30/1951, 72 y.o.   MRN: [id number]

## 2023-01-16 NOTE — Progress Notes (Signed)
PROGRESS NOTE    Regina Mckay  GUY:[id number] DOB: Aug 17, 1950 DOA: 01/07/2023 PCP: [name], NP   Brief Narrative:  This is a 72 year old female with a history of meningioma, hypertension, diabetes mellitus, severe esophagitis & erosive gastritis & recent Klebsiella bacteremia in May. Recent imaging reveals liver lesions suspicious for mets, retroperitoneal lymphadenopathy, pulmonary nodules, supraclavicular and mediastinal lymphadenopathy and thyroid nodules. EUS with biopy that was inconclusive. No CBD dilatation noted on imaging and EUS.  She was seen by oncology on 6/5 and noted to have an increasing bilirubin level and was admitted for hyperbilirubinemia, nausea,and  weakness. ERCP x 2 in hospital- found to have biliary strictures that have been stented.  Imaging also concerning for duodenal mass.   Multiple biopsy initially returned negative from pathology.  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 Bilirubin initially downtrending slowly now somewhat labile increasing over the past 24 hours.  Plan for percutaneous drain with IR.  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   History of hepatitis - Most likely hepatitis C given positive antibody testing, noted to be previously treated per patient - HCV RNA testing: not detected   Esophagitis and erosive gastritis - Continue PPI - Sucralfate started in the hospital  - Continue additional Pepcid  Diarrhea, intractable - Unclear etiology - does not appear infectious. Possibly in the setting of above biliary obstruction vs antibiotic administration - Imodium PRN loose stool   Elevated Cr/ AKI resolved - Cr .80> 1.20 with poor intake   History of recent bacteremia due to Klebsiella pneumoniae - Resolved - Source may have originally been biliary  - Completed 14 days of Augmentin   Class 3 severe obesity with serious comorbidity and body mass index (BMI) of 40.0 to 44.9 in adult [facility]) Body mass index is 39.44 kg/m.   IDDM (diabetes mellitus), type 2 with ophthalmic complications (HCC), well controlled - Carb modified diet, Semglee and NovoLog sliding scale ordered - A1C 5.8   Hypertension Moderately  well-controlled, continue amlodipine and lisinopril   Thyroid nodules, previously noted - noted to have  a multinodular goiter on [place] on 5/28- repeat US recommended in 1 yr - last TSH 1.148   DVT prophylaxis: SCDs Start: 01/07/23 1354 Code Status:   Code Status: Full Code Family Communication: Daughter at bedside  Status is: Inpatient  Dispo: The patient is from: Home              Anticipated d/c is to: To be determined, likely home              Anticipated d/c date is: To be determined              Patient currently not medically stable for discharge given advanced disease need for ongoing imaging, likely procedure possible intervention in the setting of biliary tree obstruction concerning for metastatic cancer.  Consultants:  GI, IR  Procedures:  Upper endoscopy ultrasound, ERCP with stent placement, repeat ERCP with stent removal and 2 stent placements  Antimicrobials:  As above ciprofloxacin stop date 01/16/2023  Subjective: No acute issues or events overnight, patient indicates she rested well, feels moderately improved from prior.  Diarrhea appears to be improving but not resolved.  Denies chest pain shortness of breath constipation nausea vomiting headache fevers or chills.  Objective: Vitals:   01/15/23 0946 01/15/23 1308 01/15/23 1953 01/16/23 0448  BP: 135/62 122/64 (!) 152/66 (!) 147/58  Pulse:  73 81 80  Resp:  18 17 17   Temp:  98.3 F (36.8 C) 98.1 F (36.7 C) 98.3 F (36.8 C)  TempSrc:  Oral Oral Oral  SpO2:  96% 96% 98%  Weight:      Height:        Intake/Output Summary (Last 24 hours) at 01/16/2023 0735 Last data filed at 01/15/2023 1000 Gross per 24 hour  Intake 240 ml  Output --  Net 240 ml    Filed Weights   01/07/23 1329  Weight: 101 kg    Examination:  General:  Pleasantly resting in bed, No acute distress. HEENT:  Normocephalic atraumatic.  Sclerae nonicteric, noninjected.  Extraocular movements intact bilaterally. Neck:  Without mass or deformity.  Trachea is midline. Lungs:  Without rhonchi, wheeze, or rales. Heart: Without murmurs, rubs,  or gallops. Abdomen:  Soft, nontender, nondistended. Extremities: Without cyanosis, clubbing Skin:  Warm and dry, no erythema  Data Reviewed: I have personally reviewed following labs and imaging studies  CBC: Recent Labs  Lab 01/10/23 0649 01/11/23 0631 01/13/23 0559 01/15/23 1006 01/16/23 0556  WBC 7.2 8.7 7.3 7.5 8.5  NEUTROABS 5.7 7.3  --   --  6.2  HGB 12.7 12.7 12.2 13.0 12.5  HCT 37.8 37.6 36.3 38.6 36.6  MCV 87.7 87.0 86.4 87.7 87.1  PLT 203 198 166 205 202    Basic Metabolic Panel: Recent Labs  Lab 01/12/23 0632 01/13/23 0559 01/14/23 0553 01/15/23 1006 01/16/23 0556  NA 134* 131* 132* 135 135  K 4.1 3.8 3.5 3.5 3.5  CL 101 99 100 102 101  CO2 23 23 23 23 26   GLUCOSE 138* 142* 186* 179* 147*  BUN 7* 11 13 11 10   CREATININE 0.63 0.73 0.85 0.85 0.83  CALCIUM 7.9* 7.9* 7.7* 8.1* 7.8*    GFR: Estimated Creatinine Clearance: 68.4 mL/min (by C-G formula based on SCr of 0.83 mg/dL). Liver Function Tests: Recent Labs  Lab 01/12/23 3244 01/13/23 0559 01/14/23 0553 01/15/23 1006 01/16/23 0556  AST 49* 44* 49* 50* 48*  ALT 30 27 29 27 26   ALKPHOS 117 125 120 138* 127*  BILITOT 15.7* 15.1* 14.3* 15.8* 15.4*  PROT 6.2* 5.7* 6.2* 6.4* 5.8*  ALBUMIN 2.1* 2.0* 2.1* 2.2* 2.1*    No results for input(s): "LIPASE", "AMYLASE" in the last 168 hours. No results for input(s): "AMMONIA" in the last 168 hours. Coagulation Profile: Recent Labs  Lab 01/16/23 0556  INR 1.4*    Cardiac Enzymes: No results for input(s): "CKTOTAL", "CKMB", "CKMBINDEX", "TROPONINI" in the last 168 hours. BNP (last 3 results) No results for input(s): "PROBNP" in the last 8760 hours. HbA1C: No results for input(s): "HGBA1C" in the last 72 hours. CBG: Recent Labs  Lab 01/15/23 0729 01/15/23 1112 01/15/23 1702 01/15/23 1954 01/16/23 0731  GLUCAP 137* 149* 158* 175* 132*    Lipid Profile: No results for input(s): "CHOL", "HDL", "LDLCALC", "TRIG", "CHOLHDL", "LDLDIRECT" in the  last 72 hours. Thyroid Function Tests: No results for input(s): "TSH", "T4TOTAL", "FREET4", "T3FREE", "THYROIDAB" in the last 72 hours. Anemia Panel: No results for input(s): "VITAMINB12", "FOLATE", "FERRITIN", "TIBC", "IRON", "RETICCTPCT" in the last 72 hours. Sepsis Labs: No results for input(s): "PROCALCITON", "LATICACIDVEN" in the last 168 hours.  Recent Results (from the past 240 hour(s))  Gastrointestinal Panel by PCR , Stool     Status: None   Collection Time: 01/07/23 12:00 PM  Result Value Ref Range Status   Campylobacter species NOT DETECTED NOT DETECTED Final   Plesimonas shigelloides NOT DETECTED NOT DETECTED Final   Salmonella species NOT DETECTED NOT DETECTED Final   Yersinia enterocolitica NOT DETECTED NOT DETECTED Final   Vibrio species NOT DETECTED NOT DETECTED Final   Vibrio cholerae NOT DETECTED NOT DETECTED Final   Enteroaggregative E coli (EAEC) NOT DETECTED NOT DETECTED Final   Enteropathogenic E coli (EPEC) NOT DETECTED NOT DETECTED Final   Enterotoxigenic E coli (ETEC) NOT DETECTED NOT DETECTED Final   Shiga like toxin producing E coli (STEC) NOT DETECTED NOT DETECTED Final   Shigella/Enteroinvasive E coli (EIEC) NOT DETECTED NOT DETECTED Final   Cryptosporidium NOT DETECTED NOT DETECTED Final   Cyclospora cayetanensis NOT DETECTED NOT DETECTED Final   Entamoeba histolytica NOT DETECTED NOT DETECTED Final   Giardia lamblia NOT DETECTED NOT DETECTED Final   Adenovirus F40/41 NOT DETECTED NOT DETECTED Final   Astrovirus NOT DETECTED NOT DETECTED Final   Norovirus GI/GII NOT DETECTED NOT DETECTED Final   Rotavirus A NOT DETECTED NOT DETECTED Final   Sapovirus (I, II, IV, and V) NOT DETECTED NOT DETECTED Final    Comment: Performed at [facility], [address] [place], [place] [address]  C difficile quick screen w PCR reflex     Status: None   Collection Time: 01/07/23 12:06 PM   Specimen: STOOL  Result Value Ref Range Status   C Diff antigen  NEGATIVE NEGATIVE Final   C Diff toxin NEGATIVE NEGATIVE Final   C Diff interpretation No C. difficile detected.  Final    Comment: Performed at [facility], [address], [place], [place] [address]         Radiology Studies: CT ABDOMEN PELVIS W CONTRAST  Result Date: 01/15/2023 CLINICAL DATA:  Biliary obstruction. Possible biliary drain placement. EXAM: CT ABDOMEN AND PELVIS WITH CONTRAST TECHNIQUE: Multidetector CT imaging of the abdomen and pelvis was performed using the standard protocol following bolus administration of intravenous contrast. RADIATION DOSE REDUCTION: This exam was performed according to the departmental dose-optimization program which includes automated exposure control, adjustment of the  mA and/or kV according to patient size and/or use of iterative reconstruction technique. CONTRAST:  OMNIPAQUE IOHEXOL 300 MG/ML  SOLN COMPARISON:  CT 12/20/2022, MRI 01/07/2023 FINDINGS: Lower chest: Bandlike atelectasis in the left lower lobe. Trace right pleural effusion and atelectasis. Hepatobiliary: Patient's known low-density liver lesions are better assessed on MRI. This includes the question centrally obstructing lesion on MRI, better assessed on that exam. Small amount of perihepatic ascites. Common bile duct stent in place, the common bile duct is decompressed. There is mild central intrahepatic biliary ductal dilatation of the central right and left lobe. Small focus of pneumobilia centrally. Trace perihepatic ascites. Multiple gallstones. Gallbladder wall thickening nonspecific. Pancreas: No ductal dilatation or inflammation. Spleen: Normal in size without focal abnormality. Adrenals/Urinary Tract: No adrenal nodule. No hydronephrosis. Punctate bilateral intrarenal calculi. Small cysts in the left kidney needs no further imaging follow-up. Urinary bladder is decompressed. Stomach/Bowel: Detailed bowel assessment limited in the absence of enteric contrast as  well as mild motion artifact. Possible surgical clip in the duodenum. There is wall thickening about the proximal jejunum in the left upper quadrant, series 3, image 33. Question of surgical clip in this region. No bowel obstruction. Normal appendix. Moderate colonic stool burden. No definite colonic mass. Vascular/Lymphatic: Multifocal adenopathy. There are enlarged periportal, retroperitoneal and mesenteric lymph nodes. This includes 12 mm left mesenteric node in the region of small bowel wall thickening. Prominent retrocrural nodes. Aortic atherosclerosis. No evidence of portal vein thrombus. Reproductive: Uterine fibroids. Quiescent left ovary. Right ovary not definitively seen. Other: Small volume perisplenic and perihepatic ascites. Mild generalized body wall edema. Musculoskeletal: Sclerosis centered at T12-L1 has the appearance of degenerative Modic endplate change. On presumed bone island in the right iliac bone. No suspicious bone lesion. IMPRESSION: 1. Common bile duct stent in place, the common bile duct is decompressed. There is mild central intrahepatic biliary ductal dilatation of the central right and left lobe. 2. Patient's known low-density liver lesions are better assessed on MRI. This includes the question of centrally obstructing lesion on MRI, better assessed on that exam. 3. Multifocal adenopathy, similar to prior. 4. Wall thickening about the proximal jejunum in the left upper quadrant, nonspecific. Question of surgical clip in this region. 5. Cholelithiasis. Gallbladder wall thickening is nonspecific. 6. Small volume upper abdominal ascites. 7. Uterine fibroids. 8. Nonobstructing bilateral intrarenal calculi. Aortic Atherosclerosis (ICD10-I70.0). Electronically Signed   By: Narda Rutherford M.D.   On: 01/15/2023 22:28    Scheduled Meds:  acidophilus  1 capsule Oral Daily   amLODipine  10 mg Oral Daily   ciprofloxacin  500 mg Oral BID   enoxaparin (LOVENOX) injection  50 mg Subcutaneous  Q24H   famotidine  20 mg Oral BID   indomethacin  100 mg Rectal Once   insulin aspart  0-15 Units Subcutaneous TID WC   insulin aspart  0-5 Units Subcutaneous QHS   insulin glargine-yfgn  10 Units Subcutaneous Daily   lisinopril  20 mg Oral Daily   pantoprazole  40 mg Oral BID AC   Ensure Max Protein  11 oz Oral BID   sucralfate  1 g Oral TID WC & HS   Continuous Infusions:     LOS: 9 days   Time spent:  Azucena Fallen, DO [facility]  If 7PM-7AM, please contact night-coverage [email or web address]  01/16/2023, 7:35 AM

## 2023-01-17 LAB — HEPATIC FUNCTION PANEL
ALT: 26 U/L (ref 0–44)
AST: 57 U/L — ABNORMAL HIGH (ref 15–41)
Albumin: 2.1 g/dL — ABNORMAL LOW (ref 3.5–5.0)
Alkaline Phosphatase: 120 U/L (ref 38–126)
Bilirubin, Direct: 9.7 mg/dL — ABNORMAL HIGH (ref 0.0–0.2)
Indirect Bilirubin: 8.4 mg/dL — ABNORMAL HIGH (ref 0.3–0.9)
Total Bilirubin: 18.1 mg/dL (ref 0.3–1.2)
Total Protein: 6.3 g/dL — ABNORMAL LOW (ref 6.5–8.1)

## 2023-01-17 LAB — GLUCOSE, CAPILLARY: Glucose-Capillary: 180 mg/dL — ABNORMAL HIGH (ref 70–99)

## 2023-01-17 MED ORDER — ONDANSETRON HCL 4 MG PO TABS
4.00 mg | ORAL_TABLET | Freq: Three times a day (TID) | ORAL | 1 refills | Status: DC | PRN
Start: 2023-01-17 — End: 2023-02-09

## 2023-01-17 MED ORDER — LOPERAMIDE HCL 2 MG PO CAPS: 2.0000 mg | ORAL_CAPSULE | ORAL | 0 refills | Status: DC | PRN

## 2023-01-17 MED ORDER — ACETAMINOPHEN 325 MG PO TABS: 650.0000 mg | ORAL_TABLET | Freq: Four times a day (QID) | ORAL | 0 refills | Status: DC | PRN

## 2023-01-17 MED ORDER — OXYCODONE HCL 5 MG PO TABS: 2.5000 mg | ORAL_TABLET | ORAL | 0 refills | Status: DC | PRN

## 2023-01-17 MED ORDER — SUCRALFATE 1 G PO TABS: 1.0000 g | ORAL_TABLET | Freq: Three times a day (TID) | ORAL | 1 refills | Status: DC

## 2023-01-17 NOTE — Discharge Summary (Signed)
Physician Discharge Summary  Regina Mckay ZOX:[id number] DOB: 1951/07/25 DOA: 01/07/2023  PCP: [name], NP  Admit date: 01/07/2023 Discharge date: 01/17/2023  Admitted From: Home Disposition:  Home  Recommendations for Outpatient Follow-up:  Follow up with PCP in 1-2 weeks Follow up with Oncology as scheduled  Home Health:None  Equipment/Devices:No new equipment  Discharge Condition: Stable  CODE STATUS: Full  Diet recommendation: As tolerated  Brief/Interim Summary: This is a 72 year old female with a history of meningioma, hypertension, diabetes mellitus, severe esophagitis & erosive gastritis & recent Klebsiella bacteremia in May. Recent imaging reveals liver lesions suspicious for mets, retroperitoneal lymphadenopathy, pulmonary nodules, supraclavicular and mediastinal lymphadenopathy and thyroid nodules. EUS with biopy that was inconclusive. No CBD dilatation noted on imaging and EUS.   She was seen by oncology on 6/5 and noted to have an increasing bilirubin level and was admitted for hyperbilirubinemia, nausea,and  weakness. ERCP x 2 in hospital- found to have biliary strictures that have been stented.  Imaging also concerning for duodenal mass.    Multiple biopsy initially returned negative from pathology.  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 Bilirubin initially downtrending slowly now somewhat labile at this point.  Plan for percutaneous drain with IR unsuccessful due to decompressed/small biliary tree. Given stable clinical status patient is otherwise stable and agreeable for discharge. Follow up with IR for possible drain placement in the future if indicated.  Discharge Diagnoses:  Principal Problem:   Hyperbilirubinemia Active Problems:   Bacteremia due to Klebsiella pneumoniae   Class 3 severe obesity with serious comorbidity and body mass index (BMI) of 40.0 to 44.9 in adult [facility])   DM (diabetes mellitus), type 2 with  ophthalmic complications (HCC)   Liver masses   Obstructive jaundice   Liver lesion, right lobe   History of ERCP   History of biliary duct stent placement   Duodenal adenoma  Hyperbilirubinemia -likely obstructive given multiple liver masses, duodenal mass, and biliary stricture - Repeat liver biopsy 01/13/23: 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 - Patient also has bulky abdominal adenopathy, lung nodules and thyroid nodules noted on imaging - Tumor marker elevation: CEA 18.9, CA 19-9 33K, AFP 10.2 - 6/7 ERCP - Common L and R hepatic duct strictures noted - stent placed in the left hepatic duct - 6/10- repeat ERCP- prior stent (from 6/7) removed and sent for cytology- replaced by 2 new stents, multiple biliary strictures notes, pus drained from biliary tree, new finding of duodenal mass noted - Cipro completed 01/16/23 - Liver panel previously downtrending however somewhat worse over the past 24 hours, discussed case with GI/IR -initial plan to place percutaneous drain was unsuccessful - plan to follow up later this month with IR for re-evaluation   History of hepatitis - Most likely hepatitis C given positive antibody testing, noted to be previously treated per patient - HCV RNA testing: not detected   Esophagitis and erosive gastritis - Continue PPI - Sucralfate started in the hospital  - Continue additional Pepcid   Diarrhea, intractable - Unclear etiology - does not appear infectious. Possibly in the setting of above biliary obstruction vs antibiotic administration - Imodium PRN loose stool   Elevated Cr/ AKI resolved - Cr .80> 1.20 with poor intake   History of recent bacteremia  due to Klebsiella pneumoniae -  Resolved - Source may have originally been biliary  - Completed 14 days of Augmentin   Class 3 severe obesity with serious comorbidity and body mass index (BMI) of 40.0 to 44.9 in adult [facility]) Body mass index is 39.44 kg/m.   IDDM (diabetes mellitus), type 2 with ophthalmic complications (HCC), well controlled - Carb modified diet, Semglee and NovoLog sliding scale ordered - A1C 5.8   Hypertension Moderately well-controlled, continue amlodipine and lisinopril   Thyroid nodules, previously noted - noted to have a multinodular goiter on [place] on 5/28- repeat US recommended in 1 yr - last TSH 1.148  Discharge Instructions   Allergies as of 01/17/2023       Reactions   Augmentin [amoxicillin-pot Clavulanate] Nausea Only, Other (See Comments)   Severe nausea that requires Zofran to tolerate   Lidoderm [lidocaine] Other (See Comments)   The 5% patch increased the BGL        Medication List     STOP taking these medications    amoxicillin-clavulanate 875-125 MG tablet Commonly known as: AUGMENTIN   Ozempic (0.25 or 0.5 MG/DOSE) 2 MG/3ML Sopn Generic drug: Semaglutide(0.25 or 0.5MG /DOS)   Tylenol 8 Hour Arthritis Pain 650 MG CR tablet Generic drug: acetaminophen Replaced by: acetaminophen 325 MG tablet       TAKE these medications    acetaminophen 325 MG tablet Commonly known as: TYLENOL Take 2 tablets (650 mg total) by mouth every 6 (six) hours as needed for mild pain (or Fever >/= 101). Replaces: Tylenol 8 Hour Arthritis Pain 650 MG CR tablet   amLODipine 10 MG tablet Commonly known as: NORVASC Take 1 tablet (10 mg total) by mouth daily. Follow-up appt due in July must see provider for future refills What changed: additional instructions   Basaglar KwikPen 100 UNIT/ML Inject 22 Units into the skin daily. What changed: when to take this   bismuth subsalicylate 262 MG chewable tablet Commonly known as: PEPTO BISMOL Chew 524 mg by mouth as needed for indigestion  or diarrhea or loose stools.   cholecalciferol 10 MCG (400 UNIT) Tabs tablet Commonly known as: VITAMIN D3 Take 400 Units by mouth daily.   FreeStyle Libre 2 Reader [place] 1 each by Does not apply route daily.   FreeStyle Libre 2 Sensor Misc USE 1 SENSOR EVERY 14 DAYS AS DIRECTED What changed: See the new instructions.   hydrochlorothiazide 12.5 MG capsule Commonly known as: MICROZIDE Take 1 capsule (12.5 mg total) by mouth daily.   Insulin Pen Needle 32G X 4 MM Misc Use 4x a day What changed:  how much to take how to take this when to take this   lisinopril 20 MG tablet Commonly known as: ZESTRIL Take 1 tablet (20 mg total) by mouth daily. Follow-up appt due in July What changed: additional instructions   loperamide 2 MG capsule Commonly known as: IMODIUM Take 1 capsule (2 mg total) by mouth as needed for diarrhea or loose stools.   NovoLOG FlexPen 100 UNIT/ML FlexPen Generic drug: insulin aspart Inject 5-7 units under skin up to 2x a day What changed:  how much to take how to take this when to take this additional instructions   ondansetron 4 MG tablet Commonly known as: ZOFRAN Take 1 tablet (4 mg total) by mouth every 8 (eight) hours as needed for nausea or vomiting.   oxyCODONE 5 MG immediate release tablet Commonly known as: Oxy IR/ROXICODONE Take 0.5-1 tablets (2.5-5 mg total) by mouth every  4 (four) hours as needed for moderate pain.   pantoprazole 40 MG tablet Commonly known as: PROTONIX Take 1 tablet (40 mg total) by mouth 2 (two) times daily before a meal.   sucralfate 1 g tablet Commonly known as: CARAFATE Take 1 tablet (1 g total) by mouth 4 (four) times daily -  with meals and at bedtime.   Systane Hydration PF 0.4-0.3 % Soln Generic drug: Polyethyl Glyc-Propyl Glyc PF Place 1 drop into both eyes 3 (three) times daily as needed (for dryness or irritation).        Allergies  Allergen Reactions   Augmentin [Amoxicillin-Pot Clavulanate]  Nausea Only and Other (See Comments)    Severe nausea that requires Zofran to tolerate   Lidoderm [Lidocaine] Other (See Comments)    The 5% patch increased the BGL    Consultations: Oncology, GI, IR   Procedures/Studies: IR BILIARY DRAIN PLACEMENT WITH CHOLANGIOGRAM  Result Date: 01/17/2023 INDICATION: 72 year old with cholangiocarcinoma and persistent hyperbilirubinemia despite placement of endoscopic biliary stent. EXAM: 1. Percutaneous transhepatic cholangiogram MEDICATIONS: 500 mg Levaquin; The antibiotic was administered within an appropriate time frame prior to the initiation of the procedure. ANESTHESIA/SEDATION: Moderate (conscious) sedation was employed during this procedure. A total of Versed 4 mg and Fentanyl 200 mcg was administered intravenously by the radiology nurse. Total intra-service moderate Sedation Time: 74 minutes. The patient's level of consciousness and vital signs were monitored continuously by radiology nursing throughout the procedure under my direct supervision. FLUOROSCOPY: Radiation Exposure Index (as provided by the fluoroscopic device): 179 mGy Kerma COMPLICATIONS: None immediate. CONTRAST:  15 mL Omnipaque 300 PROCEDURE: Informed written consent was obtained from the patient after a thorough discussion of the procedural risks, benefits and alternatives. All questions were addressed. Maximal Sterile Barrier Technique was utilized including caps, mask, sterile gowns, sterile gloves, sterile drape, hand hygiene and skin antiseptic. A timeout was performed prior to the initiation of the procedure. The anterior and right side of the abdomen was prepped and draped in sterile fashion. Ultrasound suggested mild biliary dilatation in the left hepatic lobe. Anterior abdomen was anesthetized using 1% lidocaine. Using ultrasound guidance, 21 gauge and 22 gauge needles were directed into the left hepatic lobe for percutaneous transhepatic cholangiography. The procedure was  technically difficult due to the minimal biliary dilatation and only small irregular peripheral bile ducts were opacified with contrast. Unable to opacify the central ducts and unable to find an adequate duct to place a biliary drain. Right hepatic lobe was also evaluated with ultrasound and no significant intrahepatic biliary dilatation was identified. Percutaneous transhepatic cholangiography was not attempted in the right hepatic lobe. Due to the minimal opacification of the bile ducts, a biliary drain placement was not performed. Bandages were placed at the puncture sites. FINDINGS: Ultrasound suggested mild intrahepatic biliary dilatation in left hepatic lobe. No significant intrahepatic biliary dilatation was identified in the right hepatic lobe with ultrasound. Patient has an endoscopic biliary stent in place. Percutaneous transhepatic cholangiography in the left hepatic lobe demonstrated small irregular bile ducts without filling of the central biliary system. IMPRESSION: 1. Technically challenging percutaneous transhepatic cholangiography. Small irregular bile ducts were identified in the left hepatic lobe but the central bile ducts were not opacified and a biliary drain was not attempted. 2. Minimal intrahepatic biliary dilatation based on ultrasound. Electronically Signed   By: Richarda Overlie M.D.   On: 01/17/2023 06:53   CT ABDOMEN PELVIS W CONTRAST  Result Date: 01/15/2023 CLINICAL DATA:  Biliary obstruction. Possible biliary drain  placement. EXAM: CT ABDOMEN AND PELVIS WITH CONTRAST TECHNIQUE: Multidetector CT imaging of the abdomen and pelvis was performed using the standard protocol following bolus administration of intravenous contrast. RADIATION DOSE REDUCTION: This exam was performed according to the departmental dose-optimization program which includes automated exposure control, adjustment of the mA and/or kV according to patient size and/or use of iterative reconstruction technique. CONTRAST:   OMNIPAQUE IOHEXOL 300 MG/ML  SOLN COMPARISON:  CT 12/20/2022, MRI 01/07/2023 FINDINGS: Lower chest: Bandlike atelectasis in the left lower lobe. Trace right pleural effusion and atelectasis. Hepatobiliary: Patient's known low-density liver lesions are better assessed on MRI. This includes the question centrally obstructing lesion on MRI, better assessed on that exam. Small amount of perihepatic ascites. Common bile duct stent in place, the common bile duct is decompressed. There is mild central intrahepatic biliary ductal dilatation of the central right and left lobe. Small focus of pneumobilia centrally. Trace perihepatic ascites. Multiple gallstones. Gallbladder wall thickening nonspecific. Pancreas: No ductal dilatation or inflammation. Spleen: Normal in size without focal abnormality. Adrenals/Urinary Tract: No adrenal nodule. No hydronephrosis. Punctate bilateral intrarenal calculi. Small cysts in the left kidney needs no further imaging follow-up. Urinary bladder is decompressed. Stomach/Bowel: Detailed bowel assessment limited in the absence of enteric contrast as well as mild motion artifact. Possible surgical clip in the duodenum. There is wall thickening about the proximal jejunum in the left upper quadrant, series 3, image 33. Question of surgical clip in this region. No bowel obstruction. Normal appendix. Moderate colonic stool burden. No definite colonic mass. Vascular/Lymphatic: Multifocal adenopathy. There are enlarged periportal, retroperitoneal and mesenteric lymph nodes. This includes 12 mm left mesenteric node in the region of small bowel wall thickening. Prominent retrocrural nodes. Aortic atherosclerosis. No evidence of portal vein thrombus. Reproductive: Uterine fibroids. Quiescent left ovary. Right ovary not definitively seen. Other: Small volume perisplenic and perihepatic ascites. Mild generalized body wall edema. Musculoskeletal: Sclerosis centered at T12-L1 has the appearance of  degenerative Modic endplate change. On presumed bone island in the right iliac bone. No suspicious bone lesion. IMPRESSION: 1. Common bile duct stent in place, the common bile duct is decompressed. There is mild central intrahepatic biliary ductal dilatation of the central right and left lobe. 2. Patient's known low-density liver lesions are better assessed on MRI. This includes the question of centrally obstructing lesion on MRI, better assessed on that exam. 3. Multifocal adenopathy, similar to prior. 4. Wall thickening about the proximal jejunum in the left upper quadrant, nonspecific. Question of surgical clip in this region. 5. Cholelithiasis. Gallbladder wall thickening is nonspecific. 6. Small volume upper abdominal ascites. 7. Uterine fibroids. 8. Nonobstructing bilateral intrarenal calculi. Aortic Atherosclerosis (ICD10-I70.0). Electronically Signed   By: Narda Rutherford M.D.   On: 01/15/2023 22:28   US BIOPSY (LIVER)  Result Date: 01/13/2023 INDICATION: [id number] Liver mass [id number] Briefly, 72 year old female with liver masses suspicious for metastases. Previous liver mass biopsy 12/23/2022 was NEGATIVE malignancy. EXAM: ULTRASOUND GUIDED LIVER MASS BIOPSY COMPARISON:  MRI abdomen, 01/07/2023 and 12/22/2022. CT AP, 12/20/2022. IR ultrasound, 12/23/2022. MEDICATIONS: None ANESTHESIA/SEDATION: Moderate (conscious) sedation was employed during this procedure. A total of Versed 2 mg and Fentanyl 100 mcg was administered intravenously. Moderate Sedation Time: 19 minutes. The patient's level of consciousness and vital signs were monitored continuously by radiology nursing throughout the procedure under my direct supervision. COMPLICATIONS: None immediate. PROCEDURE: Informed written consent was obtained from the patient and/or patient's representative after a discussion of the risks, benefits and alternatives to treatment. The patient  understands and consents the procedure. A timeout was performed prior to the  initiation of the procedure. Ultrasound scanning was performed of the right upper abdominal quadrant demonstrates ill-defined, heterogeneous inferior RIGHT hepatic lobe mass The RIGHT flank was selected for biopsy and the procedure was planned. The right upper abdominal quadrant was prepped and draped in the usual sterile fashion. The overlying soft tissues were anesthetized with 1% lidocaine with epinephrine. A 17 gauge, 6.8 cm co-axial needle was advanced into a peripheral aspect of the lesion. This was followed by 5 core biopsies with an 18 gauge core device under direct ultrasound guidance. The coaxial needle tract was embolized with a small amount of Gel-Foam slurry and superficial hemostasis was obtained with manual compression. Post procedural scanning was negative for definitive area of hemorrhage or additional complication. A dressing was placed. The patient tolerated the procedure well without immediate post procedural complication. IMPRESSION: Successful ultrasound guided core needle biopsy of liver mass. Roanna Banning, MD [facility] Electronically Signed   By: Roanna Banning M.D.   On: 01/13/2023 13:53   DG ERCP  Result Date: 01/13/2023 CLINICAL DATA:  [id number] Surgery, elective [id number] EXAM: ERCP COMPARISON:  ERCP 01/09/2023. CT AP, 12/20/2022. MRCP, 12/31/2022 and 01/07/2023. IR ultrasound, 12/23/2022. FLUOROSCOPY: Exposure Index (as provided by the fluoroscopic device): 429 mGy Kerma FINDINGS: Limited oblique planar images of the RIGHT upper quadrant obtained C-arm. Images demonstrating flexible endoscopy, biliary duct cannulation, sphincterotomy, retrograde cholangiogram, balloon sweep and plastic biliary stent placement. No biliary ductal dilation. No evidence of biliary filling defect is demonstrated. IMPRESSION: Fluoroscopic imaging for ERCP and biliary stent placement. For complete description of intra procedural findings, please see  performing service dictation. Electronically Signed   By: Roanna Banning M.D.   On: 01/13/2023 08:02   DG C-Arm 1-60 Min-No Report  Result Date: 01/12/2023 Fluoroscopy was utilized by the requesting physician.  No radiographic interpretation.   DG ERCP  Result Date: 01/09/2023 CLINICAL DATA:  ERCP for abnormal MRCP. EXAM: DG C-ARM 1-60 MIN; ERCP CONTRAST:  Please see operative report. FLUOROSCOPY: Fluoroscopy Time:  7 minutes 27 seconds Radiation Exposure Index (if provided by the fluoroscopic device): 230.61 mGy Number of Acquired Spot Images: 20 COMPARISON:  01/07/2023. FINDINGS: Fluoroscopy was utilized for ERCP. Please see operative report for additional information. IMPRESSION: Intraoperative utilization of fluoroscopy. Electronically Signed   By: Thornell Sartorius M.D.   On: 01/09/2023 20:54   DG C-Arm 1-60 Min  Result Date: 01/09/2023 CLINICAL DATA:  ERCP for abnormal MRCP. EXAM: DG C-ARM 1-60 MIN; ERCP CONTRAST:  Please see operative report. FLUOROSCOPY: Fluoroscopy Time:  7 minutes 27 seconds Radiation Exposure Index (if provided by the fluoroscopic device): 230.61 mGy Number of Acquired Spot Images: 20 COMPARISON:  01/07/2023. FINDINGS: Fluoroscopy was utilized for ERCP. Please see operative report for additional information. IMPRESSION: Intraoperative utilization of fluoroscopy. Electronically Signed   By: Thornell Sartorius M.D.   On: 01/09/2023 20:54   MR ABDOMEN MRCP W WO CONTAST  Result Date: 01/08/2023 CLINICAL DATA:  Jaundice.  Liver metastasis. EXAM: MRI ABDOMEN WITHOUT AND WITH CONTRAST (INCLUDING MRCP) TECHNIQUE: Multiplanar multisequence MR imaging of the abdomen was performed both before and after the administration of intravenous contrast. Heavily T2-weighted images of the biliary and pancreatic ducts were obtained, and three-dimensional MRCP images were rendered by post processing. CONTRAST:  10mL GADAVIST GADOBUTROL 1 MMOL/ML IV SOLN COMPARISON:  12/31/2022 FINDINGS: Lower chest: Trace  right pleural fluid. Hepatobiliary: As noted on recent MRIs there  are multifocal hypoenhancing liver lesions suspicious for metastatic disease. Index lesion within the lateral dome measures 2.6 x 1.7 cm, image 16/18. This is unchanged from 12/22/2022. Index lesion within the posteroinferior right lobe of liver measures 1.8 x 1.3 cm, image 44/18. Previously 1.6 x 1.2 cm. Also in the posterior right lobe of liver there is a 1.4 by 0.9 cm lesion. Previously 1.1 x 0.9 cm. Small gallstones. Mild gallbladder wall thickening measures up to 3 mm. Trace pericholecystic fluid. Mild intrahepatic bile duct dilatation. Unchanged from 12/22/2022. On the MRCP mid images the there is abrupt termination of the converging intrahepatic bile loops centrally suggesting underlying central obstructing lesion, image 4/12. The common bile duct appears nondilated. Pancreas: No mass, inflammatory changes, or other parenchymal abnormality identified. Spleen:  Within normal limits in size and appearance. Adrenals/Urinary Tract: Normal adrenal glands. Small left kidney cysts are identified which measure up to 8 mm. No follow-up imaging recommended. Stomach/Bowel: Visualized portions within the abdomen are unremarkable. Vascular/Lymphatic: Normal appearance of the abdominal aorta. Patent upper abdominal vascularity. 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 Enlarged lymph nodes within the hepatic duodenal ligament and retroperitoneum are again noted. Other: Mild perihepatic ascites and perisplenic ascites. Similar to 12/31/22 Musculoskeletal: No suspicious bone lesions identified. IMPRESSION: 1. Again noted are multifocal hypoenhancing liver lesions suspicious for metastatic disease. The dominant lesion within the lateral dome of liver is unchanged from previous exam. 2. Mild intrahepatic bile duct dilatation with abrupt termination of the converging intrahepatic bile loops centrally suggesting  underlying central obstructing lesion versus mass effect from enlarged portahepatic lymph nodes. The common bile duct appears nondilated. Consider further evaluation with ERCP. 3. Gallstones with mild gallbladder wall thickening. 4. Enlarged lymph nodes within the hepatic duodenal ligament and retroperitoneum are again noted. Concerning for nodal metastasis. 5. Mild perihepatic ascites and perisplenic ascites. Electronically Signed   By: Signa Kell M.D.   On: 01/08/2023 06:44   MR 3D Recon At Scanner  Result Date: 01/08/2023 CLINICAL DATA:  Jaundice.  Liver metastasis. EXAM: MRI ABDOMEN WITHOUT AND WITH CONTRAST (INCLUDING MRCP) TECHNIQUE: Multiplanar multisequence MR imaging of the abdomen was performed both before and after the administration of intravenous contrast. Heavily T2-weighted images of the biliary and pancreatic ducts were obtained, and three-dimensional MRCP images were rendered by post processing. CONTRAST:  10mL GADAVIST GADOBUTROL 1 MMOL/ML IV SOLN COMPARISON:  12/31/2022 FINDINGS: Lower chest: Trace right pleural fluid. Hepatobiliary: As noted on recent MRIs there are multifocal hypoenhancing liver lesions suspicious for metastatic disease. Index lesion within the lateral dome measures 2.6 x 1.7 cm, image 16/18. This is unchanged from 12/22/2022. Index lesion within the posteroinferior right lobe of liver measures 1.8 x 1.3 cm, image 44/18. Previously 1.6 x 1.2 cm. Also in the posterior right lobe of liver there is a 1.4 by 0.9 cm lesion. Previously 1.1 x 0.9 cm. Small gallstones. Mild gallbladder wall thickening measures up to 3 mm. Trace pericholecystic fluid. Mild intrahepatic bile duct dilatation. Unchanged from 12/22/2022. On the MRCP mid images the there is abrupt termination of the converging intrahepatic bile loops centrally suggesting underlying central obstructing lesion, image 4/12. The common bile duct appears nondilated. Pancreas: No mass, inflammatory changes, or other  parenchymal abnormality identified. Spleen:  Within normal limits in size and appearance. Adrenals/Urinary Tract: Normal adrenal glands. Small left kidney cysts are identified which measure up to 8 mm. No follow-up imaging recommended. Stomach/Bowel: Visualized portions within the abdomen are unremarkable.  Vascular/Lymphatic: Normal appearance of the abdominal aorta. Patent upper abdominal vascularity. 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 Enlarged lymph nodes within the hepatic duodenal ligament and retroperitoneum are again noted. Other: Mild perihepatic ascites and perisplenic ascites. Similar to 12/31/22 Musculoskeletal: No suspicious bone lesions identified. IMPRESSION: 1. Again noted are multifocal hypoenhancing liver lesions suspicious for metastatic disease. The dominant lesion within the lateral dome of liver is unchanged from previous exam. 2. Mild intrahepatic bile duct dilatation with abrupt termination of the converging intrahepatic bile loops centrally suggesting underlying central obstructing lesion versus mass effect from enlarged portahepatic lymph nodes. The common bile duct appears nondilated. Consider further evaluation with ERCP. 3. Gallstones with mild gallbladder wall thickening. 4. Enlarged lymph nodes within the hepatic duodenal ligament and retroperitoneum are again noted. Concerning for nodal metastasis. 5. Mild perihepatic ascites and perisplenic ascites. Electronically Signed   By: Signa Kell M.D.   On: 01/08/2023 06:44   MR 3D Recon At Scanner  Result Date: 12/31/2022 : CLINICAL DATA: Elevated liver enzymes, multiple liver lesions recently biopsied EXAM: MRI ABDOMEN WITHOUT AND WITH CONTRAST (INCLUDING MRCP) TECHNIQUE: Multiplanar multisequence MR imaging of the abdomen was performed both before and after the administration of intravenous contrast. Heavily T2-weighted images of the biliary and pancreatic ducts were obtained,  and three-dimensional MRCP images were rendered by post processing. CONTRAST: 10mL GADAVIST GADOBUTROL 1 MMOL/ML IV SOLN COMPARISON: 12/22/2022 FINDINGS: Lower chest: No acute abnormality. Hepatobiliary: Multiple hypoenhancing liver lesions are again seen, the largest within the peripheral liver dome, hepatic segments VII and VIII (series 18, image 11). There are multiple additional smaller hypoenhancing lesions scattered throughout the liver, predominantly in the inferior right lobe, not described by prior examination but unchanged (series 18, image 38, 25). Small gallstones (series 8, image 20). No gallbladder wall thickening, or biliary dilatation. Pancreas: Unremarkable. No pancreatic ductal dilatation or surrounding inflammatory changes. Spleen: Normal in size without significant abnormality. Adrenals/Urinary Tract: Adrenal glands are unremarkable. Kidneys are normal, without renal calculi, solid lesion, or hydronephrosis. Stomach/Bowel: Stomach is within normal limits. No evidence of bowel wall thickening, distention, or inflammatory changes. Vascular/Lymphatic: No significant vascular findings are present. Unchanged bulky, matted gastrohepatic ligament, celiac axis, portacaval, retroperitoneal, and small bowel mesenteric lymph nodes. Other: No abdominal wall hernia or abnormality. Trace perihepatic ascites. Musculoskeletal: No acute or significant osseous findings. IMPRESSION: 1. Multiple hypoenhancing liver lesions are again seen, the largest within the peripheral liver dome, hepatic segments VII and VIII. There are multiple additional smaller hypoenhancing lesions scattered throughout the liver, predominantly in the inferior right lobe, not described by prior examination but unchanged. Findings remain most consistent with metastases. Correlate with biopsy findings. 2. Unchanged bulky, matted abdominal lymphadenopathy, consistent with nodal metastatic disease. 3. Cholelithiasis without evidence of acute  cholecystitis. 4. No biliary ductal dilatation. 5. Trace perihepatic ascites. Electronically Signed   By: Jearld Lesch M.D.   On: 12/31/2022 12:40   MR ABDOMEN MRCP W WO CONTAST  Result Date: 12/31/2022 CLINICAL DATA:  Elevated liver enzymes, multiple liver lesions recently biopsied EXAM: MRI ABDOMEN WITHOUT AND WITH CONTRAST (INCLUDING MRCP) TECHNIQUE: Multiplanar multisequence MR imaging of the abdomen was performed both before and after the administration of intravenous contrast. Heavily T2-weighted images of the biliary and pancreatic ducts were obtained, and three-dimensional MRCP images were rendered by post processing. CONTRAST:  10mL GADAVIST GADOBUTROL 1 MMOL/ML IV SOLN COMPARISON:  12/22/2022 FINDINGS: Lower chest: No acute abnormality. Hepatobiliary: Multiple hypoenhancing liver lesions  are again seen, the largest within the peripheral liver dome, hepatic segments VII and VIII (series 18, image 11). There are multiple additional smaller hypoenhancing lesions scattered throughout the liver, predominantly in the inferior right lobe, not described by prior examination but unchanged (series 18, image 38, 25). Small gallstones (series 8, image 20). No gallbladder wall thickening, or biliary dilatation. Pancreas: Unremarkable. No pancreatic ductal dilatation or surrounding inflammatory changes. Spleen: Normal in size without significant abnormality. Adrenals/Urinary Tract: Adrenal glands are unremarkable. Kidneys are normal, without renal calculi, solid lesion, or hydronephrosis. Stomach/Bowel: Stomach is within normal limits. No evidence of bowel wall thickening, distention, or inflammatory changes. Vascular/Lymphatic: No significant vascular findings are present. Unchanged bulky, matted gastrohepatic ligament, celiac axis, portacaval, retroperitoneal, and small bowel mesenteric lymph nodes. Other: No abdominal wall hernia or abnormality. Trace perihepatic ascites. Musculoskeletal: No acute or significant  osseous findings. IMPRESSION: 1. Multiple hypoenhancing liver lesions are again seen, the largest within the peripheral liver dome, hepatic segments VII and VIII. There are multiple additional smaller hypoenhancing lesions scattered throughout the liver, predominantly in the inferior right lobe, not described by prior examination but unchanged. Findings remain most consistent with metastases. Correlate with biopsy findings. 2. Unchanged bulky, matted abdominal lymphadenopathy, consistent with nodal metastatic disease. 3. Cholelithiasis without evidence of acute cholecystitis. 4. No biliary ductal dilatation. 5. Trace perihepatic ascites. Electronically Signed   By: Jearld Lesch M.D.   On: 12/31/2022 12:18   US THYROID  Result Date: 12/31/2022 CLINICAL DATA:  Incidental on CT. EXAM: THYROID ULTRASOUND TECHNIQUE: Ultrasound examination of the thyroid gland and adjacent soft tissues was performed. COMPARISON:  None Available. FINDINGS: Parenchymal Echotexture: Moderately heterogenous Isthmus: 1.0 cm Right lobe: 5.7 x 2.7 x 3.1 cm Left lobe: 4.3 x 1.7 x 2.3 cm _________________________________________________________ Estimated total number of nodules >/= 1 cm: 3 Number of spongiform nodules >/=  2 cm not described below (TR1): 0 Number of mixed cystic and solid nodules >/= 1.5 cm not described below (TR2): 0 _________________________________________________________ Nodule # 1: Isoechoic ill-defined solid nodule in the right mid gland measures 2.1 x 1.9 x 1.6 cm. No evidence of calcifications. Findings are consistent with TI-RADS category 3. *Given size (>/= 1.5 - 2.4 cm) and appearance, a follow-up ultrasound in 1 year should be considered based on TI-RADS criteria. Nodule # 2: Smaller 1.4 cm isoechoic solid nodule versus pseudo nodule in the right inferior gland. Given size (<1.4 cm) and appearance, this nodule does NOT meet TI-RADS criteria for biopsy or dedicated follow-up. Nodule # 3: Ill-defined 1.3 cm  hypoechoic solid nodule in the left inferior gland. TI-RADS category 4. *Given size (>/= 1 - 1.4 cm) and appearance, a follow-up ultrasound in 1 year should be considered based on TI-RADS criteria. IMPRESSION: 1. Enlarged, heterogeneous and multinodular thyroid gland. 2. Nodule # 1 is a 2.1 cm TI-RADS category 3 nodule in the right mid gland meets criteria for imaging surveillance. Recommend follow-up ultrasound in 1 year. 3. Nodule # 3 is a 1.3 cm TI-RADS category 4 nodule in the left inferior gland and also meets criteria for imaging surveillance. The above is in keeping with the ACR TI-RADS recommendations - J Am Coll Radiol 2017;14:587-595. Electronically Signed   By: Malachy Moan M.D.   On: 12/31/2022 06:55   US BIOPSY (LIVER)  Result Date: 12/23/2022 INDICATION: [id number] Liver lesion [id number] EXAM: ULTRASOUND GUIDED LIVER MASS BIOPSY COMPARISON:  CT AP, 12/20/2022.  US Abdomen, 12/21/2022 MEDICATIONS: None ANESTHESIA/SEDATION: Moderate (conscious) sedation was employed during this procedure.  A total of Versed 1.5 mg and Fentanyl 100 mcg was administered intravenously. Moderate Sedation Time: 23 minutes. The patient's level of consciousness and vital signs were monitored continuously by radiology nursing throughout the procedure under my direct supervision. COMPLICATIONS: None immediate. PROCEDURE: Informed written consent was obtained from the patient and/or patient's representative after a discussion of the risks, benefits and alternatives to treatment. The patient understands and consents the procedure. A timeout was performed prior to the initiation of the procedure. Ultrasound scanning was performed of the right upper abdominal quadrant demonstrates ill-defined RIGHT hepatic lobe liver mass The RIGHT hepatic lobe mass was selected for biopsy and the procedure was planned. The right upper abdominal quadrant was prepped and draped in the usual sterile fashion. The overlying soft tissues were  anesthetized with 1% lidocaine with epinephrine. A 17 gauge, 6.8 cm co-axial needle was advanced into a peripheral aspect of the lesion. This was followed by 4 core biopsies with an 18 gauge core device under direct ultrasound guidance. The coaxial needle tract was embolized with a small amount of Gel-Foam slurry and superficial hemostasis was obtained with manual compression. Post procedural scanning was negative for definitive area of hemorrhage or additional complication. A dressing was placed. The patient tolerated the procedure well without immediate post procedural complication. IMPRESSION: Successful ultrasound guided core needle biopsy of liver mass. Roanna Banning, MD [facility] Electronically Signed   By: Roanna Banning M.D.   On: 12/23/2022 17:32   MR ABDOMEN MRCP W WO CONTAST  Result Date: 12/23/2022 CLINICAL DATA:  Right upper quadrant abdominal pain. Liver lesions on previous imaging. EXAM: MRI ABDOMEN WITHOUT AND WITH CONTRAST (INCLUDING MRCP) TECHNIQUE: Multiplanar multisequence MR imaging of the abdomen was performed both before and after the administration of intravenous contrast. Heavily T2-weighted images of the biliary and pancreatic ducts were obtained, and three-dimensional MRCP images were rendered by post processing. CONTRAST:  10mL GADAVIST GADOBUTROL 1 MMOL/ML IV SOLN COMPARISON:  Chest CT 12/21/2022.  Abdomen and pelvis CT 12/20/2022 FINDINGS: Lower chest: The pulmonary nodule seen on recent chest CT are not well demonstrated by MRI. Hepatobiliary: As noted on recent CT scan, 3 distinct lesions are noted in the liver. These are relatively subtle but do show some peripheral enhancement and evidence of restricted diffusion. Index lesion anterior hepatic dome measures 2.3 x 2.2 cm on T2 image 10 of series 7. Lesion more posterior in the hepatic dome measures 1.9 cm on the same image. Posterior right hepatic lobe lesion measures 14 mm  on image 11 of series 7. Additional tiny hypoenhancing lesions seen in the liver parenchyma on previous CT are less well demonstrated by MRI. Intrahepatic bile ducts are somewhat prominent but there is no extrahepatic biliary duct dilatation. No evidence for choledocholithiasis. Gallbladder wall is diffusely thickened and edematous. Tiny dependent gallstones (2-3 mm) are seen in the dependent gallbladder lumen (coronal T2 image 12 of series 6 and axial T2 image 23 of series 7) MRCP images are motion degraded but some element of mass-effect on the confluence of the left and right hepatic ducts cannot be excluded.). Pancreas: No focal mass lesion. No dilatation of the main duct. No intraparenchymal cyst. No peripancreatic edema. Spleen:  No splenomegaly. No focal mass lesion. Adrenals/Urinary Tract: No adrenal nodule or mass. Kidneys unremarkable. Stomach/Bowel: Stomach is unremarkable. No gastric wall thickening. No evidence of outlet obstruction. Duodenum is normally positioned as is the ligament of Treitz. No small bowel or colonic dilatation within the  visualized abdomen. Vascular/Lymphatic: No abdominal aortic aneurysm. No abdominal aortic atherosclerotic calcification. Main portal vein is patent. Right portal vein appears markedly attenuated and patency cannot be confirmed. This is similar to recent CT scan. SMV and splenic vein are patent. Bulky lymphadenopathy in the hepatoduodenal ligament again noted. There is associated retroperitoneal lymphadenopathy. Other:  No substantial intraperitoneal free fluid. Musculoskeletal: No focal suspicious marrow enhancement within the visualized bony anatomy. IMPRESSION: 1. 3 distinct lesions are noted in the liver parenchyma. These are relatively subtle but do show some peripheral enhancement and evidence of restricted diffusion. Additional tiny hypoenhancing lesions seen in the liver parenchyma on previous CT are less well demonstrated by MRI. Imaging features raise  concern for metastatic disease. 2. Bulky lymphadenopathy in the hepatoduodenal ligament again noted. There is associated retroperitoneal lymphadenopathy. 3. Right portal vein appears markedly attenuated and patency cannot be confirmed. This is similar to recent CT scan. Main portal vein is patent. SMV and splenic vein are patent. 4. Cholelithiasis with apparent gallbladder wall thickening and mural edema. Acute cholecystitis not excluded. 5. MRCP images are motion degraded but there is no extrahepatic biliary duct dilatation. No evidence for choledocholithiasis. Some element of mass-effect on the confluence of the left and right hepatic ducts cannot be excluded. 6. The pulmonary nodule seen on recent chest CT are not well demonstrated by MRI. Electronically Signed   By: Kennith Center M.D.   On: 12/23/2022 06:46   MR 3D Recon At Scanner  Result Date: 12/23/2022 CLINICAL DATA:  Right upper quadrant abdominal pain. Liver lesions on previous imaging. EXAM: MRI ABDOMEN WITHOUT AND WITH CONTRAST (INCLUDING MRCP) TECHNIQUE: Multiplanar multisequence MR imaging of the abdomen was performed both before and after the administration of intravenous contrast. Heavily T2-weighted images of the biliary and pancreatic ducts were obtained, and three-dimensional MRCP images were rendered by post processing. CONTRAST:  10mL GADAVIST GADOBUTROL 1 MMOL/ML IV SOLN COMPARISON:  Chest CT 12/21/2022.  Abdomen and pelvis CT 12/20/2022 FINDINGS: Lower chest: The pulmonary nodule seen on recent chest CT are not well demonstrated by MRI. Hepatobiliary: As noted on recent CT scan, 3 distinct lesions are noted in the liver. These are relatively subtle but do show some peripheral enhancement and evidence of restricted diffusion. Index lesion anterior hepatic dome measures 2.3 x 2.2 cm on T2 image 10 of series 7. Lesion more posterior in the hepatic dome measures 1.9 cm on the same image. Posterior right hepatic lobe lesion measures 14 mm on  image 11 of series 7. Additional tiny hypoenhancing lesions seen in the liver parenchyma on previous CT are less well demonstrated by MRI. Intrahepatic bile ducts are somewhat prominent but there is no extrahepatic biliary duct dilatation. No evidence for choledocholithiasis. Gallbladder wall is diffusely thickened and edematous. Tiny dependent gallstones (2-3 mm) are seen in the dependent gallbladder lumen (coronal T2 image 12 of series 6 and axial T2 image 23 of series 7) MRCP images are motion degraded but some element of mass-effect on the confluence of the left and right hepatic ducts cannot be excluded.). Pancreas: No focal mass lesion. No dilatation of the main duct. No intraparenchymal cyst. No peripancreatic edema. Spleen:  No splenomegaly. No focal mass lesion. Adrenals/Urinary Tract: No adrenal nodule or mass. Kidneys unremarkable. Stomach/Bowel: Stomach is unremarkable. No gastric wall thickening. No evidence of outlet obstruction. Duodenum is normally positioned as is the ligament of Treitz. No small bowel or colonic dilatation within the visualized abdomen. Vascular/Lymphatic: No abdominal aortic aneurysm. No abdominal aortic atherosclerotic  calcification. Main portal vein is patent. Right portal vein appears markedly attenuated and patency cannot be confirmed. This is similar to recent CT scan. SMV and splenic vein are patent. Bulky lymphadenopathy in the hepatoduodenal ligament again noted. There is associated retroperitoneal lymphadenopathy. Other:  No substantial intraperitoneal free fluid. Musculoskeletal: No focal suspicious marrow enhancement within the visualized bony anatomy. IMPRESSION: 1. 3 distinct lesions are noted in the liver parenchyma. These are relatively subtle but do show some peripheral enhancement and evidence of restricted diffusion. Additional tiny hypoenhancing lesions seen in the liver parenchyma on previous CT are less well demonstrated by MRI. Imaging features raise concern  for metastatic disease. 2. Bulky lymphadenopathy in the hepatoduodenal ligament again noted. There is associated retroperitoneal lymphadenopathy. 3. Right portal vein appears markedly attenuated and patency cannot be confirmed. This is similar to recent CT scan. Main portal vein is patent. SMV and splenic vein are patent. 4. Cholelithiasis with apparent gallbladder wall thickening and mural edema. Acute cholecystitis not excluded. 5. MRCP images are motion degraded but there is no extrahepatic biliary duct dilatation. No evidence for choledocholithiasis. Some element of mass-effect on the confluence of the left and right hepatic ducts cannot be excluded. 6. The pulmonary nodule seen on recent chest CT are not well demonstrated by MRI. Electronically Signed   By: Kennith Center M.D.   On: 12/23/2022 06:46   CT CHEST W CONTRAST  Result Date: 12/22/2022 CLINICAL DATA:  Inpatient. Abnormal CT abdomen/pelvis study with liver lesions and retroperitoneal adenopathy suspicious for metastatic disease of uncertain primary. * Tracking Code: BO * EXAM: CT CHEST WITH CONTRAST TECHNIQUE: Multidetector CT imaging of the chest was performed during intravenous contrast administration. RADIATION DOSE REDUCTION: This exam was performed according to the departmental dose-optimization program which includes automated exposure control, adjustment of the mA and/or kV according to patient size and/or use of iterative reconstruction technique. CONTRAST:  75mL OMNIPAQUE IOHEXOL 300 MG/ML  SOLN COMPARISON:  12/20/2022 CT abdomen/pelvis and chest radiograph. FINDINGS: Cardiovascular: Normal heart size. No significant pericardial effusion/thickening. Three-vessel coronary atherosclerosis. Atherosclerotic nonaneurysmal thoracic aorta. Normal caliber pulmonary arteries. No central pulmonary emboli. Mediastinum/Nodes: Hypodense posterior right thyroid 1.5 cm nodule. Unremarkable esophagus. Mild left supraclavicular adenopathy up to 1.0 cm  (series 2/image 15). No axillary adenopathy. Mildly enlarged low posterior mediastinal para-lymph nodes up to 1.2 cm on the right (series 2/image 104). No hilar adenopathy. Lungs/Pleura: No pneumothorax. No pleural effusion. No acute consolidative airspace disease or lung masses. At least five solid pulmonary nodules scattered in the lungs bilaterally, largest 0.5 cm in the posteromedial basilar subpleural right lower lobe (series 6/image 107), 0.4 cm in the medial left lower lobe (series 6/image 100) and 0.4 cm in the anterior right middle lobe (series 6/image 70). Upper abdomen: Multiple persistent hypodense superior right liver lesions, poorly defined on this chest CT study. Subcentimeter hypodense posterior upper left renal cortical lesion, too small to characterize, for which no follow-up imaging is recommended. Partially visualized porta hepatis, portacaval and aortocaval adenopathy in the upper abdomen, better seen on CT abdomen study from 1 day prior. Musculoskeletal: No aggressive appearing focal osseous lesions. Marked thoracic degenerative disc disease. IMPRESSION: 1. At least five solid pulmonary nodules scattered in the lungs bilaterally, largest 0.5 cm, equivocal for pulmonary metastases. Recommend attention on follow-up chest CT in 3 months. 2. Mild left supraclavicular and low posterior mediastinal lymphadenopathy, suspicious for nodal metastases. 3. Multiple persistent hypodense superior right liver lesions and partially visualized porta hepatis, portacaval and aortocaval adenopathy in  the upper abdomen, all better seen on CT abdomen study from 1 day prior. 4. Hypodense posterior right thyroid 1.5 cm nodule. Recommend non-emergent thyroid ultrasound. Reference: J Am Coll Radiol. 2015 Feb;12(2): 143-50 5. Three-vessel coronary atherosclerosis. 6.  Aortic Atherosclerosis (ICD10-I70.0). Electronically Signed   By: Delbert Phenix M.D.   On: 12/22/2022 08:00   US Abdomen Limited RUQ (LIVER/GB)  Result  Date: 12/21/2022 CLINICAL DATA:  Abdominal pain EXAM: ULTRASOUND ABDOMEN LIMITED RIGHT UPPER QUADRANT COMPARISON:  CT from the previous day FINDINGS: Gallbladder: Gallbladder incompletely distended. Mild gallbladder wall thickening up to 6 mm. To nerve root reports no sonographic Murphy sign however. Several gallstones measuring up to 8 mm in the lumen. Common bile duct: Diameter: 4 mm.  No intrahepatic biliary ductal dilatation. Liver: Scattered echogenic lesions corresponding to CT findings. Portal vein is patent on color Doppler imaging with normal direction of blood flow towards the liver. Other: Technologist describes technically difficult study secondary to patient breathing, overlying bowel, body habitus. IMPRESSION: 1. Cholelithiasis with mild gallbladder wall thickening. No sonographic Murphy sign. 2. No biliary dilatation. 3. Scattered echogenic liver lesions corresponding to CT findings, presumed metastatic disease. Electronically Signed   By: Corlis Leak M.D.   On: 12/21/2022 11:16   CT ABDOMEN PELVIS W CONTRAST  Result Date: 12/20/2022 CLINICAL DATA:  72 year old female with acute abdominal and pelvic pain. EXAM: CT ABDOMEN AND PELVIS WITH CONTRAST TECHNIQUE: Multidetector CT imaging of the abdomen and pelvis was performed using the standard protocol following bolus administration of intravenous contrast. RADIATION DOSE REDUCTION: This exam was performed according to the departmental dose-optimization program which includes automated exposure control, adjustment of the mA and/or kV according to patient size and/or use of iterative reconstruction technique. CONTRAST:  OMNIPAQUE IOHEXOL 300 MG/ML  SOLN COMPARISON:  05/17/2019 CT and prior studies FINDINGS: Lower chest: No acute abnormality. Hepatobiliary: There are multiple low-density lesions within both the RIGHT and LEFT liver, with index 1.5 x 2 cm lesion and 1.5 x 2.5 cm lesion at the dome (image 10: Series 2). Gallbladder wall thickening  and cholelithiasis noted. No definite biliary dilatation identified. Pancreas: Unremarkable Spleen: Unremarkable Adrenals/Urinary Tract: Punctate nonobstructing bilateral renal calculi are again identified without other significant change. There is no evidence of suspicious renal mass hydronephrosis. The adrenal glands and bladder are unremarkable. Stomach/Bowel: No definite bowel wall thickening noted. There is no evidence of bowel obstruction or inflammatory changes. Vascular/Lymphatic: Periportal, mesenteric, periaortic and retroperitoneal adenopathy identified with the index 1.6 x 3.9 cm portacaval node (22:2) identified. Aortic atherosclerotic calcifications noted without aneurysm. Reproductive: Uterus and adnexal regions are unchanged with ureteral calcifications/fibroids. Other: No ascites, focal collection or pneumoperitoneum. Musculoskeletal: No acute or suspicious bony abnormalities are noted. Moderate degenerative disc disease/spondylosis at T12-L1 and L4-5 again noted. IMPRESSION: 1. Multiple low-density lesions throughout the liver and abdominal/retroperitoneal lymphadenopathy, highly suspicious for malignancy/metastatic disease. Primary malignancy is not identified on this examination. 2. Gallbladder wall thickening and cholelithiasis without definite biliary dilatation. If there is strong clinical suspicion for acute cholecystitis, recommend ultrasound or nuclear medicine study. 3. Punctate nonobstructing bilateral renal calculi. 4.  Aortic Atherosclerosis (ICD10-I70.0). Electronically Signed   By: Harmon Pier M.D.   On: 12/20/2022 15:38   DG Chest Port 1 View  Result Date: 12/20/2022 CLINICAL DATA:  Nausea and vomiting.  Melena.  Sepsis. EXAM: PORTABLE CHEST 1 VIEW COMPARISON:  10/10/2020 FINDINGS: Patient is rotated to the left. Heart size is within normal limits. Aortic atherosclerotic calcification incidentally noted. Both lungs are  clear. IMPRESSION: No active disease. Electronically Signed    By: Danae Orleans M.D.   On: 12/20/2022 13:55     Subjective: No acute issues/events overnight   Discharge Exam: Vitals:   01/16/23 2046 01/17/23 0536  BP: (!) 142/99 (!) 130/56  Pulse: 79 80  Resp: 14 14  Temp: 99 F (37.2 C) 98.8 F (37.1 C)  SpO2: 99% 96%   Vitals:   01/16/23 1555 01/16/23 1651 01/16/23 2046 01/17/23 0536  BP: 137/73 130/60 (!) 142/99 (!) 130/56  Pulse: 71 71 79 80  Resp: 14  14 14   Temp:  98.8 F (37.1 C) 99 F (37.2 C) 98.8 F (37.1 C)  TempSrc:  Oral Oral Oral  SpO2: 99% 98% 99% 96%  Weight:      Height:        General: Pt is alert, awake, not in acute distress Cardiovascular: RRR, S1/S2 +, no rubs, no gallops Respiratory: CTA bilaterally, no wheezing, no rhonchi Abdominal: Soft, NT, ND, bowel sounds + Extremities: no edema, no cyanosis    The results of significant diagnostics from this hospitalization (including imaging, microbiology, ancillary and laboratory) are listed below for reference.     Microbiology: No results found for this or any previous visit (from the past 240 hour(s)).    Labs: BNP (last 3 results) Recent Labs    12/23/22 1813  BNP 46.9   Basic Metabolic Panel: Recent Labs  Lab 01/12/23 0632 01/13/23 0559 01/14/23 0553 01/15/23 1006 01/16/23 0556  NA 134* 131* 132* 135 135  K 4.1 3.8 3.5 3.5 3.5  CL 101 99 100 102 101  CO2 23 23 23 23 26   GLUCOSE 138* 142* 186* 179* 147*  BUN 7* 11 13 11 10   CREATININE 0.63 0.73 0.85 0.85 0.83  CALCIUM 7.9* 7.9* 7.7* 8.1* 7.8*   Liver Function Tests: Recent Labs  Lab 01/13/23 0559 01/14/23 0553 01/15/23 1006 01/16/23 0556 01/17/23 0644  AST 44* 49* 50* 48* 57*  ALT 27 29 27 26 26   ALKPHOS 125 120 138* 127* 120  BILITOT 15.1* 14.3* 15.8* 15.4* 18.1*  PROT 5.7* 6.2* 6.4* 5.8* 6.3*  ALBUMIN 2.0* 2.1* 2.2* 2.1* 2.1*   No results for input(s): "LIPASE", "AMYLASE" in the last 168 hours. No results for input(s): "AMMONIA" in the last 168 hours. CBC: Recent Labs   Lab 01/11/23 0631 01/13/23 0559 01/15/23 1006 01/16/23 0556  WBC 8.7 7.3 7.5 8.5  NEUTROABS 7.3  --   --  6.2  HGB 12.7 12.2 13.0 12.5  HCT 37.6 36.3 38.6 36.6  MCV 87.0 86.4 87.7 87.1  PLT 198 166 205 202   Cardiac Enzymes: No results for input(s): "CKTOTAL", "CKMB", "CKMBINDEX", "TROPONINI" in the last 168 hours. BNP: Invalid input(s): "POCBNP" CBG: Recent Labs  Lab 01/16/23 0731 01/16/23 1153 01/16/23 1634 01/16/23 2046 01/17/23 0717  GLUCAP 132* 114* 122* 130* 180*   D-Dimer No results for input(s): "DDIMER" in the last 72 hours. Hgb A1c No results for input(s): "HGBA1C" in the last 72 hours. Lipid Profile No results for input(s): "CHOL", "HDL", "LDLCALC", "TRIG", "CHOLHDL", "LDLDIRECT" in the last 72 hours. Thyroid function studies No results for input(s): "TSH", "T4TOTAL", "T3FREE", "THYROIDAB" in the last 72 hours.  Invalid input(s): "FREET3" Anemia work up No results for input(s): "VITAMINB12", "FOLATE", "FERRITIN", "TIBC", "IRON", "RETICCTPCT" in the last 72 hours. Urinalysis    Component Value Date/Time   COLORURINE YELLOW 12/20/2022 1859   APPEARANCEUR CLEAR 12/20/2022 1859   LABSPEC 1.010 12/20/2022 1859  PHURINE 5.5 12/20/2022 1859   GLUCOSEU NEGATIVE 12/20/2022 1859   HGBUR NEGATIVE 12/20/2022 1859   BILIRUBINUR SMALL (A) 12/20/2022 1859   KETONESUR NEGATIVE 12/20/2022 1859   PROTEINUR NEGATIVE 12/20/2022 1859   UROBILINOGEN 1.0 10/18/2009 1052   NITRITE NEGATIVE 12/20/2022 1859   LEUKOCYTESUR NEGATIVE 12/20/2022 1859   Sepsis Labs Recent Labs  Lab 01/11/23 0631 01/13/23 0559 01/15/23 1006 01/16/23 0556  WBC 8.7 7.3 7.5 8.5   Microbiology No results found for this or any previous visit (from the past 240 hour(s)).    Time coordinating discharge: Over 30 minutes  SIGNED:   Azucena Fallen, DO [facility] 01/17/2023, 4:59 PM Pager   If 7PM-7AM, please contact night-coverage [email or web address]

## 2023-01-17 NOTE — Plan of Care (Signed)
Patient is alert and oriented X4, all discharge paperwork explained to patient and sister, PIV removed, patients sister to transport her home, patient left in stable and safe condition, all appointments explained and medications.  Problem: Education: Goal: Knowledge of General Education information will improve Description: Including pain rating scale, medication(s)/side effects and non-pharmacologic comfort measures Outcome: Adequate for Discharge   Problem: Health Behavior/Discharge Planning: Goal: Ability to manage health-related needs will improve Outcome: Adequate for Discharge   Problem: Clinical Measurements: Goal: Ability to maintain clinical measurements within normal limits will improve Outcome: Adequate for Discharge Goal: Will remain free from infection Outcome: Adequate for Discharge Goal: Diagnostic test results will improve Outcome: Adequate for Discharge Goal: Respiratory complications will improve Outcome: Adequate for Discharge Goal: Cardiovascular complication will be avoided Outcome: Adequate for Discharge   Problem: Activity: Goal: Risk for activity intolerance will decrease Outcome: Adequate for Discharge   Problem: Nutrition: Goal: Adequate nutrition will be maintained Outcome: Adequate for Discharge   Problem: Coping: Goal: Level of anxiety will decrease Outcome: Adequate for Discharge   Problem: Elimination: Goal: Will not experience complications related to bowel motility Outcome: Adequate for Discharge Goal: Will not experience complications related to urinary retention Outcome: Adequate for Discharge   Problem: Pain Managment: Goal: General experience of comfort will improve Outcome: Adequate for Discharge   Problem: Safety: Goal: Ability to remain free from injury will improve Outcome: Adequate for Discharge   Problem: Skin Integrity: Goal: Risk for impaired skin integrity will decrease Outcome: Adequate for Discharge

## 2023-01-19 ENCOUNTER — Telehealth: Payer: Self-pay

## 2023-01-19 NOTE — Transitions of Care (Post Inpatient/ED Visit) (Signed)
01/19/2023  Name: Regina Mckay MRN: [id number] DOB: 02/27/1951  Today's TOC FU Call Status: Today's TOC FU Call Status:: Successful TOC FU Call Competed TOC FU Call Complete Date: 01/19/23  Transition Care Management Follow-up Telephone Call Date of Discharge: 01/17/23 Discharge Facility: [facility]) Type of Discharge: Inpatient Admission Primary Inpatient Discharge Diagnosis:: liver disease How have you been since you were released from the hospital?: Better Any questions or concerns?: No  Items Reviewed: Did you receive and understand the discharge instructions provided?: Yes Medications obtained,verified, and reconciled?: Yes (Medications Reviewed) Any new allergies since your discharge?: No Dietary orders reviewed?: NA Do you have support at home?: Yes People in Home: grandchild(ren) Name of Support/Comfort Primary Source: Arianna  Medications Reviewed Today: Medications Reviewed Today     Reviewed by Delana Meyer (Medical Assistant) on 01/19/23 at 1007  Med List Status: <None>   Medication Order Taking? Sig Documenting Provider Last Dose Status Informant  acetaminophen (TYLENOL) 325 MG tablet [id number] Yes Take 2 tablets (650 mg total) by mouth every 6 (six) hours as needed for mild pain (or Fever >/= 101). Azucena Fallen, MD Taking Active   amLODipine (NORVASC) 10 MG tablet [id number] Yes Take 1 tablet (10 mg total) by mouth daily. Follow-up appt due in July must see provider for future refills  Patient taking differently: Take 10 mg by mouth daily.   [name], NP Taking Active Self  bismuth subsalicylate (PEPTO BISMOL) 262 MG chewable tablet [id number] Yes Chew 524 mg by mouth as needed for indigestion or diarrhea or loose stools. [provider] Taking Active Self  cholecalciferol (VITAMIN D3) 10 MCG (400 UNIT) TABS tablet [id number] Yes Take 400 Units by mouth daily. [provider] Taking Active Self  Continuous Blood Gluc Receiver  (FREESTYLE LIBRE 2 READER) DEVI [id number] Yes 1 each by Does not apply route daily. Carlus Pavlov, MD Taking Active Self  Continuous Blood Gluc Sensor (FREESTYLE LIBRE 2 SENSOR) [place] [id number] Yes USE 1 SENSOR EVERY 14 DAYS AS DIRECTED  Patient taking differently: Inject 1 Device into the skin every 14 (fourteen) days.   Carlus Pavlov, MD Taking Active Self  hydrochlorothiazide (MICROZIDE) 12.5 MG capsule [id number] Yes Take 1 capsule (12.5 mg total) by mouth daily. [name], NP Taking Active Self  insulin aspart (NOVOLOG FLEXPEN) 100 UNIT/ML FlexPen [id number] Yes Inject 5-7 units under skin up to 2x a day  Patient taking differently: Inject 7-9 Units into the skin See admin instructions. Inject 7-9 into the skin twice a day 15 minutes before meals   Carlus Pavlov, MD Taking Active Self  Insulin Glargine [facility] KWIKPEN) 100 UNIT/ML [id number] Yes Inject 22 Units into the skin daily.  Patient taking differently: Inject 22 Units into the skin in the morning.   Carlus Pavlov, MD Taking Active Self  Insulin Pen Needle 32G X 4 MM MISC [id number] Yes Use 4x a day  Patient taking differently: 1 each by Other route See admin instructions. Use 4x a day   Carlus Pavlov, MD Taking Active Self  lisinopril (ZESTRIL) 20 MG tablet [id number] Yes Take 1 tablet (20 mg total) by mouth daily. Follow-up appt due in July  Patient taking differently: Take 20 mg by mouth daily.   [name], NP Taking Active Self  loperamide (IMODIUM) 2 MG capsule [id number] Yes Take 1 capsule (2 mg total) by mouth as needed for diarrhea or loose stools. Azucena Fallen, MD Taking Active   ondansetron [facility]) 4 MG tablet  [id number] Yes Take 1 tablet (4 mg total) by mouth every 8 (eight) hours as needed for nausea or vomiting. Azucena Fallen, MD Taking Active   oxyCODONE (OXY IR/ROXICODONE) 5 MG immediate release tablet [id number] Yes Take 0.5-1 tablets (2.5-5 mg total) by mouth every 4 (four) hours as  needed for moderate pain. Azucena Fallen, MD Taking Active   pantoprazole (PROTONIX) 40 MG tablet [id number] Yes Take 1 tablet (40 mg total) by mouth 2 (two) times daily before a meal. Rai, Ripudeep K, MD Taking Active Self  sucralfate (CARAFATE) 1 g tablet [id number] Yes Take 1 tablet (1 g total) by mouth 4 (four) times daily -  with meals and at bedtime. Azucena Fallen, MD Taking Active   SYSTANE HYDRATION PF 0.4-0.3 % SOLN [id number] Yes Place 1 drop into both eyes 3 (three) times daily as needed (for dryness or irritation). [provider] Taking Active Self            Home Care and Equipment/Supplies: Were Home Health Services Ordered?: No Any new equipment or medical supplies ordered?: No  Functional Questionnaire: Do you need assistance with bathing/showering or dressing?: No Do you need assistance with meal preparation?: No Do you need assistance with eating?: No Do you have difficulty maintaining continence: No Do you need assistance with getting out of bed/getting out of a chair/moving?: No Do you have difficulty managing or taking your medications?: No  Follow up appointments reviewed: PCP Follow-up appointment confirmed?:  (does not wish to schedule at this time wants to go through Dr Mosetta Putt) [facility] Follow-up appointment confirmed?: Yes Date of Specialist follow-up appointment?: 01/23/23 Follow-Up Specialty Provider:: Dr Mosetta Putt Do you need transportation to your follow-up appointment?: No Do you understand care options if your condition(s) worsen?: Yes-patient verbalized understanding    SIGNATURE TB,CMA

## 2023-01-19 NOTE — Progress Notes (Signed)
I sent an email to [facility] pathology requesting MMR testing on specimen [phone].

## 2023-01-20 ENCOUNTER — Other Ambulatory Visit: Payer: Self-pay

## 2023-01-20 DIAGNOSIS — C221 Intrahepatic bile duct carcinoma: Secondary | ICD-10-CM

## 2023-01-20 NOTE — Progress Notes (Signed)
MMR and NSG testing requested via [employer] on line portal.  Order [id number]. PT will hve peripheral blood drawn on 01/23/2023 at [facility].

## 2023-01-21 ENCOUNTER — Other Ambulatory Visit: Payer: Self-pay

## 2023-01-21 NOTE — Progress Notes (Signed)
The proposed treatment discussed in conference is for discussion purpose only and is not a binding recommendation.  The patients have not been physically examined, or presented with their treatment options.  Therefore, final treatment plans cannot be decided.  

## 2023-01-22 NOTE — Progress Notes (Unsigned)
[facility] OFFICE PROGRESS NOTE  [name], NP [address] [place] [address]  DIAGNOSIS: Adenocarcinoma, most consistent with cholangiocarcinoma   CURRENT THERAPY: Hospice   INTERVAL HISTORY: Regina Mckay 72 y.o. female returns to the clinic for a follow-up visit.  In summary, the patient was hospitalized in May 2024 for sepsis with nausea, vomiting, diarrhea, and Klebsiella bacteremia. Imaging studies showed concern for metastatic disease/malignancy with multiple low-density lesions throughout the liver, abdominal, retroperitoneal, and left supraclavicular, and low posterior mediastinal lymphadenopathy concerning for nodal metastases.  She also had pulmonary nodules.   The patient was subsequently hospitalized twice (5/28-5/31 and 6/5-6/15) for obstructive jaundice, weakness, nausea, and to expedite the workup. The patient underwent several inconclusive biopsies. Eventually, FNA on 01/13/23 was consistent with adenocarcinoma. Taking into consideration the radiographic distribution of disease, IHC, negative EGD, Dr. 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   She had ERCP x 2 in hospital, she was found to have biliary strictures that had been stented. She had slowly downtrending bilirubin initially but then was somewhat labile. She had unsuccessful percutaneous drain with IR due to decompressed/small biliary tree. Her bilirubin started to increase on the day of discharge to 18.0.  Dr. Mosetta Putt saw the patient while admitted. The patient continued to be quite symptomatic with poor performance status. Her condition is not curable. Given her poor performance status and liver dysfunction, Dr. Mosetta Putt does not feel she is a candidate for chemotherapy. Given the lack of improvement in her hyperbilirubinemia, it unclear if any further stenting or drain placement would have lasting improvement in her labs/symptoms.   MMR testing was  requested a few days ago but is not resulted yet.  She had [employer] testing performed today.    50%. In bed and chair. Difficult situation fatigue and not safe. Waiting for a few tests to see if IO is an option. Problem IO does not work right away and take a month. Can still offer.   Since being discharged, she is feeling fatigued.  She estimates she spends about 50% of the day in bed.  She lives with her granddaughter.  The patient states that she is able to perform her basic activities of daily living.  Does not use any assistive devices for ambulation.  She spends about half of the day up in a chair watching TV.  In addition to fatigue and generalized weakness, her other main complaint is related to poor appetite and weight loss.  She does not like the consistency of boost and Ensure so she has purchased the boost breeze which is not milk-based.  She previously was endorsing odynophagia and dysphagia secondary to esophagitis but she denies any more odynophagia or dysphagia.  She was previously taking Carafate.  She does still continue to have nausea or vomiting.  She reportedly had vomiting 5 times yesterday.  She is prescribed Zofran.  She also continues to have intermittent diarrhea.  She takes Imodium once per day.  She did not realize that she can take more Imodium 1 tablet/day.  She denies any chest pain, shortness of breath, cough, or hemoptysis.  She denies any pain at this time.  She denies any unusual itching.  She does not recognize the jaundice in herself but her family continues to note the jaundice.  She reports that her urine she believes is turning a more normal color.  She is here today for evaluation and to discuss her next steps and  repeat blood work.     MEDICAL HISTORY: Past Medical History:  Diagnosis Date   Diabetes mellitus without complication (HCC)    Hypertension     ALLERGIES:  is allergic to augmentin [amoxicillin-pot clavulanate] and lidoderm  [lidocaine].  MEDICATIONS:  Current Outpatient Medications  Medication Sig Dispense Refill   acetaminophen (TYLENOL) 325 MG tablet Take 2 tablets (650 mg total) by mouth every 6 (six) hours as needed for mild pain (or Fever >/= 101). 30 tablet 0   amLODipine (NORVASC) 10 MG tablet Take 1 tablet (10 mg total) by mouth daily. Follow-up appt due in July must see provider for future refills (Patient taking differently: Take 10 mg by mouth daily.) 90 tablet 0   bismuth subsalicylate (PEPTO BISMOL) 262 MG chewable tablet Chew 524 mg by mouth as needed for indigestion or diarrhea or loose stools.     cholecalciferol (VITAMIN D3) 10 MCG (400 UNIT) TABS tablet Take 400 Units by mouth daily.     Continuous Blood Gluc Receiver (FREESTYLE LIBRE 2 READER) DEVI 1 each by Does not apply route daily. 1 each 0   Continuous Blood Gluc Sensor (FREESTYLE LIBRE 2 SENSOR) MISC USE 1 SENSOR EVERY 14 DAYS AS DIRECTED (Patient taking differently: Inject 1 Device into the skin every 14 (fourteen) days.) 6 each 1   hydrochlorothiazide (MICROZIDE) 12.5 MG capsule Take 1 capsule (12.5 mg total) by mouth daily. 90 capsule 2   insulin aspart (NOVOLOG FLEXPEN) 100 UNIT/ML FlexPen Inject 5-7 units under skin up to 2x a day (Patient taking differently: Inject 7-9 Units into the skin See admin instructions. Inject 7-9 into the skin twice a day 15 minutes before meals) 30 mL 3   Insulin Glargine (BASAGLAR KWIKPEN) 100 UNIT/ML Inject 22 Units into the skin daily. (Patient taking differently: Inject 22 Units into the skin in the morning.) 30 mL 3   Insulin Pen Needle 32G X 4 MM MISC Use 4x a day (Patient taking differently: 1 each by Other route See admin instructions. Use 4x a day) 300 each 3   lisinopril (ZESTRIL) 20 MG tablet Take 1 tablet (20 mg total) by mouth daily. Follow-up appt due in July (Patient taking differently: Take 20 mg by mouth daily.) 90 tablet 0   loperamide (IMODIUM) 2 MG capsule Take 1 capsule (2 mg total) by mouth  as needed for diarrhea or loose stools. 30 capsule 0   ondansetron (ZOFRAN) 4 MG tablet Take 1 tablet (4 mg total) by mouth every 8 (eight) hours as needed for nausea or vomiting. 30 tablet 1   oxyCODONE (OXY IR/ROXICODONE) 5 MG immediate release tablet Take 0.5-1 tablets (2.5-5 mg total) by mouth every 4 (four) hours as needed for moderate pain. 20 tablet 0   pantoprazole (PROTONIX) 40 MG tablet Take 1 tablet (40 mg total) by mouth 2 (two) times daily before a meal. 60 tablet 3   sucralfate (CARAFATE) 1 g tablet Take 1 tablet (1 g total) by mouth 4 (four) times daily -  with meals and at bedtime. 120 tablet 1   SYSTANE HYDRATION PF 0.4-0.3 % SOLN Place 1 drop into both eyes 3 (three) times daily as needed (for dryness or irritation).     No current facility-administered medications for this visit.    SURGICAL HISTORY:  Past Surgical History:  Procedure Laterality Date   APPLICATION OF CRANIAL NAVIGATION N/A 10/11/2020   Procedure: APPLICATION OF CRANIAL NAVIGATION;  Surgeon: Bedelia Person, MD;  Location: [facility] OR;  Service: Neurosurgery;  Laterality: N/A;   BILIARY BRUSHING  01/09/2023   Procedure: BILIARY BRUSHING;  Surgeon: Lynann Bologna, MD;  Location: [facility];  Service: Gastroenterology;;   BILIARY BRUSHING  01/12/2023   Procedure: BILIARY BRUSHING;  Surgeon: [name]., MD;  Location: [facility];  Service: Gastroenterology;;   BILIARY DILATION  01/12/2023   Procedure: BILIARY DILATION;  Surgeon: [name]., MD;  Location: [facility];  Service: Gastroenterology;;   BILIARY STENT PLACEMENT N/A 01/09/2023   Procedure: BILIARY STENT PLACEMENT;  Surgeon: Lynann Bologna, MD;  Location: [facility];  Service: Gastroenterology;  Laterality: N/A;   BILIARY STENT PLACEMENT N/A 01/12/2023   Procedure: BILIARY STENT PLACEMENT;  Surgeon: Meridee Score Netty Starring., MD;  Location: [facility];  Service: Gastroenterology;  Laterality: N/A;   BIOPSY  01/01/2023   Procedure:  BIOPSY;  Surgeon: Meridee Score Netty Starring., MD;  Location: [facility];  Service: Gastroenterology;;   BIOPSY  01/12/2023   Procedure: BIOPSY;  Surgeon: [name]., MD;  Location: [facility];  Service: Gastroenterology;;   CRANIOTOMY N/A 10/11/2020   Procedure: CRANIOTOMY FOR RESECTION OF MENINGIOMA;  Surgeon: Bedelia Person, MD;  Location: [facility] OR;  Service: Neurosurgery;  Laterality: N/A;   ERCP N/A 01/09/2023   Procedure: ENDOSCOPIC RETROGRADE CHOLANGIOPANCREATOGRAPHY (ERCP);  Surgeon: Lynann Bologna, MD;  Location: [facility];  Service: Gastroenterology;  Laterality: N/A;   ERCP N/A 01/12/2023   Procedure: ENDOSCOPIC RETROGRADE CHOLANGIOPANCREATOGRAPHY (ERCP);  Surgeon: [name]., MD;  Location: [facility];  Service: Gastroenterology;  Laterality: N/A;   ESOPHAGOGASTRODUODENOSCOPY (EGD) WITH PROPOFOL N/A 01/01/2023   Procedure: ESOPHAGOGASTRODUODENOSCOPY (EGD) WITH PROPOFOL;  Surgeon: Meridee Score Netty Starring., MD;  Location: [facility];  Service: Gastroenterology;  Laterality: N/A;   ESOPHAGOGASTRODUODENOSCOPY (EGD) WITH PROPOFOL N/A 01/12/2023   Procedure: ESOPHAGOGASTRODUODENOSCOPY (EGD) WITH PROPOFOL;  Surgeon: Meridee Score Netty Starring., MD;  Location: [facility];  Service: Gastroenterology;  Laterality: N/A;   EUS N/A 01/01/2023   Procedure: UPPER ENDOSCOPIC ULTRASOUND (EUS) LINEAR;  Surgeon: [name]., MD;  Location: [facility];  Service: Gastroenterology;  Laterality: N/A;   FINE NEEDLE ASPIRATION N/A 01/01/2023   Procedure: FINE NEEDLE ASPIRATION (FNA) LINEAR;  Surgeon: [name]., MD;  Location: [facility];  Service: Gastroenterology;  Laterality: N/A;   HEMOSTASIS CLIP PLACEMENT  01/12/2023   Procedure: HEMOSTASIS CLIP PLACEMENT;  Surgeon: [name]., MD;  Location: [facility];  Service: Gastroenterology;;   IR BILIARY DRAIN PLACEMENT WITH CHOLANGIOGRAM  01/16/2023   REMOVAL OF STONES  01/09/2023   Procedure:  REMOVAL OF STONES;  Surgeon: Lynann Bologna, MD;  Location: [facility];  Service: Gastroenterology;;   REMOVAL OF STONES  01/12/2023   Procedure: REMOVAL OF SLUDGE;  Surgeon: [name]., MD;  Location: [facility];  Service: Gastroenterology;;   REPLACEMENT TOTAL KNEE     SPHINCTEROTOMY  01/09/2023   Procedure: Dennison Mascot;  Surgeon: Lynann Bologna, MD;  Location: [facility];  Service: Gastroenterology;;   Francine Graven REMOVAL  01/12/2023   Procedure: STENT REMOVAL;  Surgeon: [name]., MD;  Location: [facility];  Service: Gastroenterology;;   SUBMUCOSAL TATTOO INJECTION  01/12/2023   Procedure: SUBMUCOSAL TATTOO INJECTION;  Surgeon: [name]., MD;  Location: [facility];  Service: Gastroenterology;;   TONSILLECTOMY      REVIEW OF SYSTEMS:   Review of Systems  Constitutional: Positive for fatigue, generalized weakness, appetite change, and weight loss. Negative for chills and fever. HENT: Negative for mouth sores, nosebleeds, sore throat and trouble swallowing.   Eyes: Positive for scleral icterus.  Respiratory: Negative for cough, hemoptysis, shortness of breath and wheezing.   Cardiovascular: Negative for chest pain and leg swelling.  Gastrointestinal: Positive for intermittent nausea, vomiting, and diarrhea. negative for abdominal pain and constipation. Genitourinary: Negative for bladder incontinence, difficulty urinating, dysuria, frequency and hematuria.   Musculoskeletal: Negative for back pain, gait problem, neck pain and neck stiffness.  Skin: Consulted for jaundice.  Negative for itching and rash.  Neurological: Negative for dizziness, extremity weakness, gait problem, headaches, light-headedness and seizures.  Hematological: Negative for adenopathy. Does not bruise/bleed easily.  Psychiatric/Behavioral: Negative for confusion, depression and sleep disturbance. The patient is not nervous/anxious.     PHYSICAL EXAMINATION:  There were no  vitals taken for this visit.  ECOG PERFORMANCE STATUS: 2-3  Physical Exam  Constitutional: Oriented to person, place, and time and fatigued appearing female and in no distress.  HENT:  Head: Normocephalic and atraumatic.  Mouth/Throat: Oropharynx is clear and moist. No oropharyngeal exudate.  Eyes: Positive for yellowing of the sclera.  Right eye exhibits no discharge. Left eye exhibits no discharge.  Neck: Normal range of motion. Neck supple.  Cardiovascular: Normal rate, regular rhythm, normal heart sounds and intact distal pulses.   Pulmonary/Chest: Effort normal and breath sounds normal. No respiratory distress. No wheezes. No rales.  Abdominal: Soft. Bowel sounds are normal. Exhibits no distension and no mass. There is no tenderness.  Musculoskeletal: Normal range of motion. Exhibits no edema.  Lymphadenopathy:    No cervical adenopathy.  Neurological: Alert and oriented to person, place, and time. Exhibits normal muscle tone.  Examined in the wheelchair. Skin: Skin is warm and dry. No rash noted. Not diaphoretic. No erythema. No pallor.  Psychiatric: Mood, memory and judgment normal.  Vitals reviewed.  LABORATORY DATA: Lab Results  Component Value Date   WBC 8.5 01/16/2023   HGB 12.5 01/16/2023   HCT 36.6 01/16/2023   MCV 87.1 01/16/2023   PLT 202 01/16/2023      Chemistry      Component Value Date/Time   NA 135 01/16/2023 0556   K 3.5 01/16/2023 0556   CL 101 01/16/2023 0556   CO2 26 01/16/2023 0556   BUN 10 01/16/2023 0556   CREATININE 0.83 01/16/2023 0556   CREATININE 0.91 01/07/2023 0954      Component Value Date/Time   CALCIUM 7.8 (L) 01/16/2023 0556   ALKPHOS 120 01/17/2023 0644   AST 57 (H) 01/17/2023 0644   AST 65 (H) 01/07/2023 0954   ALT 26 01/17/2023 0644   ALT 38 01/07/2023 0954   BILITOT 18.1 (HH) 01/17/2023 0644   BILITOT 14.1 (HH) 01/07/2023 0954       RADIOGRAPHIC STUDIES:  IR BILIARY DRAIN PLACEMENT WITH CHOLANGIOGRAM  Result Date:  01/17/2023 INDICATION: 72 year old with cholangiocarcinoma and persistent hyperbilirubinemia despite placement of endoscopic biliary stent. EXAM: 1. Percutaneous transhepatic cholangiogram MEDICATIONS: 500 mg Levaquin; The antibiotic was administered within an appropriate time frame prior to the initiation of the procedure. ANESTHESIA/SEDATION: Moderate (conscious) sedation was employed during this procedure. A total of Versed 4 mg and Fentanyl 200 mcg was administered intravenously by the radiology nurse. Total intra-service moderate Sedation Time: 74 minutes. The patient's level of consciousness and vital signs were monitored continuously by radiology nursing throughout the procedure under my direct supervision. FLUOROSCOPY: Radiation Exposure Index (as provided by the fluoroscopic device): 179 mGy Kerma COMPLICATIONS: None immediate. CONTRAST:  15 mL Omnipaque 300 PROCEDURE: Informed written consent was obtained from the patient after a thorough discussion of the procedural risks,  benefits and alternatives. All questions were addressed. Maximal Sterile Barrier Technique was utilized including caps, mask, sterile gowns, sterile gloves, sterile drape, hand hygiene and skin antiseptic. A timeout was performed prior to the initiation of the procedure. The anterior and right side of the abdomen was prepped and draped in sterile fashion. Ultrasound suggested mild biliary dilatation in the left hepatic lobe. Anterior abdomen was anesthetized using 1% lidocaine. Using ultrasound guidance, 21 gauge and 22 gauge needles were directed into the left hepatic lobe for percutaneous transhepatic cholangiography. The procedure was technically difficult due to the minimal biliary dilatation and only small irregular peripheral bile ducts were opacified with contrast. Unable to opacify the central ducts and unable to find an adequate duct to place a biliary drain. Right hepatic lobe was also evaluated with ultrasound and no  significant intrahepatic biliary dilatation was identified. Percutaneous transhepatic cholangiography was not attempted in the right hepatic lobe. Due to the minimal opacification of the bile ducts, a biliary drain placement was not performed. Bandages were placed at the puncture sites. FINDINGS: Ultrasound suggested mild intrahepatic biliary dilatation in left hepatic lobe. No significant intrahepatic biliary dilatation was identified in the right hepatic lobe with ultrasound. Patient has an endoscopic biliary stent in place. Percutaneous transhepatic cholangiography in the left hepatic lobe demonstrated small irregular bile ducts without filling of the central biliary system. IMPRESSION: 1. Technically challenging percutaneous transhepatic cholangiography. Small irregular bile ducts were identified in the left hepatic lobe but the central bile ducts were not opacified and a biliary drain was not attempted. 2. Minimal intrahepatic biliary dilatation based on ultrasound. Electronically Signed   By: Richarda Overlie M.D.   On: 01/17/2023 06:53   CT ABDOMEN PELVIS W CONTRAST  Result Date: 01/15/2023 CLINICAL DATA:  Biliary obstruction. Possible biliary drain placement. EXAM: CT ABDOMEN AND PELVIS WITH CONTRAST TECHNIQUE: Multidetector CT imaging of the abdomen and pelvis was performed using the standard protocol following bolus administration of intravenous contrast. RADIATION DOSE REDUCTION: This exam was performed according to the departmental dose-optimization program which includes automated exposure control, adjustment of the mA and/or kV according to patient size and/or use of iterative reconstruction technique. CONTRAST:  OMNIPAQUE IOHEXOL 300 MG/ML  SOLN COMPARISON:  CT 12/20/2022, MRI 01/07/2023 FINDINGS: Lower chest: Bandlike atelectasis in the left lower lobe. Trace right pleural effusion and atelectasis. Hepatobiliary: Patient's known low-density liver lesions are better assessed on MRI. This includes  the question centrally obstructing lesion on MRI, better assessed on that exam. Small amount of perihepatic ascites. Common bile duct stent in place, the common bile duct is decompressed. There is mild central intrahepatic biliary ductal dilatation of the central right and left lobe. Small focus of pneumobilia centrally. Trace perihepatic ascites. Multiple gallstones. Gallbladder wall thickening nonspecific. Pancreas: No ductal dilatation or inflammation. Spleen: Normal in size without focal abnormality. Adrenals/Urinary Tract: No adrenal nodule. No hydronephrosis. Punctate bilateral intrarenal calculi. Small cysts in the left kidney needs no further imaging follow-up. Urinary bladder is decompressed. Stomach/Bowel: Detailed bowel assessment limited in the absence of enteric contrast as well as mild motion artifact. Possible surgical clip in the duodenum. There is wall thickening about the proximal jejunum in the left upper quadrant, series 3, image 33. Question of surgical clip in this region. No bowel obstruction. Normal appendix. Moderate colonic stool burden. No definite colonic mass. Vascular/Lymphatic: Multifocal adenopathy. There are enlarged periportal, retroperitoneal and mesenteric lymph nodes. This includes 12 mm left mesenteric node in the region of small bowel wall thickening.  Prominent retrocrural nodes. Aortic atherosclerosis. No evidence of portal vein thrombus. Reproductive: Uterine fibroids. Quiescent left ovary. Right ovary not definitively seen. Other: Small volume perisplenic and perihepatic ascites. Mild generalized body wall edema. Musculoskeletal: Sclerosis centered at T12-L1 has the appearance of degenerative Modic endplate change. On presumed bone island in the right iliac bone. No suspicious bone lesion. IMPRESSION: 1. Common bile duct stent in place, the common bile duct is decompressed. There is mild central intrahepatic biliary ductal dilatation of the central right and left lobe. 2.  Patient's known low-density liver lesions are better assessed on MRI. This includes the question of centrally obstructing lesion on MRI, better assessed on that exam. 3. Multifocal adenopathy, similar to prior. 4. Wall thickening about the proximal jejunum in the left upper quadrant, nonspecific. Question of surgical clip in this region. 5. Cholelithiasis. Gallbladder wall thickening is nonspecific. 6. Small volume upper abdominal ascites. 7. Uterine fibroids. 8. Nonobstructing bilateral intrarenal calculi. Aortic Atherosclerosis (ICD10-I70.0). Electronically Signed   By: Narda Rutherford M.D.   On: 01/15/2023 22:28   US BIOPSY (LIVER)  Result Date: 01/13/2023 INDICATION: [id number] Liver mass [id number] Briefly, 72 year old female with liver masses suspicious for metastases. Previous liver mass biopsy 12/23/2022 was NEGATIVE malignancy. EXAM: ULTRASOUND GUIDED LIVER MASS BIOPSY COMPARISON:  MRI abdomen, 01/07/2023 and 12/22/2022. CT AP, 12/20/2022. IR ultrasound, 12/23/2022. MEDICATIONS: None ANESTHESIA/SEDATION: Moderate (conscious) sedation was employed during this procedure. A total of Versed 2 mg and Fentanyl 100 mcg was administered intravenously. Moderate Sedation Time: 19 minutes. The patient's level of consciousness and vital signs were monitored continuously by radiology nursing throughout the procedure under my direct supervision. COMPLICATIONS: None immediate. PROCEDURE: Informed written consent was obtained from the patient and/or patient's representative after a discussion of the risks, benefits and alternatives to treatment. The patient understands and consents the procedure. A timeout was performed prior to the initiation of the procedure. Ultrasound scanning was performed of the right upper abdominal quadrant demonstrates ill-defined, heterogeneous inferior RIGHT hepatic lobe mass The RIGHT flank was selected for biopsy and the procedure was planned. The right upper abdominal quadrant was prepped and  draped in the usual sterile fashion. The overlying soft tissues were anesthetized with 1% lidocaine with epinephrine. A 17 gauge, 6.8 cm co-axial needle was advanced into a peripheral aspect of the lesion. This was followed by 5 core biopsies with an 18 gauge core device under direct ultrasound guidance. The coaxial needle tract was embolized with a small amount of Gel-Foam slurry and superficial hemostasis was obtained with manual compression. Post procedural scanning was negative for definitive area of hemorrhage or additional complication. A dressing was placed. The patient tolerated the procedure well without immediate post procedural complication. IMPRESSION: Successful ultrasound guided core needle biopsy of liver mass. Roanna Banning, MD [facility] Electronically Signed   By: Roanna Banning M.D.   On: 01/13/2023 13:53   DG ERCP  Result Date: 01/13/2023 CLINICAL DATA:  [id number] Surgery, elective [id number] EXAM: ERCP COMPARISON:  ERCP 01/09/2023. CT AP, 12/20/2022. MRCP, 12/31/2022 and 01/07/2023. IR ultrasound, 12/23/2022. FLUOROSCOPY: Exposure Index (as provided by the fluoroscopic device): 429 mGy Kerma FINDINGS: Limited oblique planar images of the RIGHT upper quadrant obtained C-arm. Images demonstrating flexible endoscopy, biliary duct cannulation, sphincterotomy, retrograde cholangiogram, balloon sweep and plastic biliary stent placement. No biliary ductal dilation. No evidence of biliary filling defect is demonstrated. IMPRESSION: Fluoroscopic imaging for ERCP and biliary stent placement. For complete description of intra procedural findings, please see performing service  dictation. Electronically Signed   By: Roanna Banning M.D.   On: 01/13/2023 08:02   DG C-Arm 1-60 Min-No Report  Result Date: 01/12/2023 Fluoroscopy was utilized by the requesting physician.  No radiographic interpretation.   DG ERCP  Result Date: 01/09/2023 CLINICAL DATA:   ERCP for abnormal MRCP. EXAM: DG C-ARM 1-60 MIN; ERCP CONTRAST:  Please see operative report. FLUOROSCOPY: Fluoroscopy Time:  7 minutes 27 seconds Radiation Exposure Index (if provided by the fluoroscopic device): 230.61 mGy Number of Acquired Spot Images: 20 COMPARISON:  01/07/2023. FINDINGS: Fluoroscopy was utilized for ERCP. Please see operative report for additional information. IMPRESSION: Intraoperative utilization of fluoroscopy. Electronically Signed   By: Thornell Sartorius M.D.   On: 01/09/2023 20:54   DG C-Arm 1-60 Min  Result Date: 01/09/2023 CLINICAL DATA:  ERCP for abnormal MRCP. EXAM: DG C-ARM 1-60 MIN; ERCP CONTRAST:  Please see operative report. FLUOROSCOPY: Fluoroscopy Time:  7 minutes 27 seconds Radiation Exposure Index (if provided by the fluoroscopic device): 230.61 mGy Number of Acquired Spot Images: 20 COMPARISON:  01/07/2023. FINDINGS: Fluoroscopy was utilized for ERCP. Please see operative report for additional information. IMPRESSION: Intraoperative utilization of fluoroscopy. Electronically Signed   By: Thornell Sartorius M.D.   On: 01/09/2023 20:54   MR ABDOMEN MRCP W WO CONTAST  Result Date: 01/08/2023 CLINICAL DATA:  Jaundice.  Liver metastasis. EXAM: MRI ABDOMEN WITHOUT AND WITH CONTRAST (INCLUDING MRCP) TECHNIQUE: Multiplanar multisequence MR imaging of the abdomen was performed both before and after the administration of intravenous contrast. Heavily T2-weighted images of the biliary and pancreatic ducts were obtained, and three-dimensional MRCP images were rendered by post processing. CONTRAST:  10mL GADAVIST GADOBUTROL 1 MMOL/ML IV SOLN COMPARISON:  12/31/2022 FINDINGS: Lower chest: Trace right pleural fluid. Hepatobiliary: As noted on recent MRIs there are multifocal hypoenhancing liver lesions suspicious for metastatic disease. Index lesion within the lateral dome measures 2.6 x 1.7 cm, image 16/18. This is unchanged from 12/22/2022. Index lesion within the posteroinferior right lobe  of liver measures 1.8 x 1.3 cm, image 44/18. Previously 1.6 x 1.2 cm. Also in the posterior right lobe of liver there is a 1.4 by 0.9 cm lesion. Previously 1.1 x 0.9 cm. Small gallstones. Mild gallbladder wall thickening measures up to 3 mm. Trace pericholecystic fluid. Mild intrahepatic bile duct dilatation. Unchanged from 12/22/2022. On the MRCP mid images the there is abrupt termination of the converging intrahepatic bile loops centrally suggesting underlying central obstructing lesion, image 4/12. The common bile duct appears nondilated. Pancreas: No mass, inflammatory changes, or other parenchymal abnormality identified. Spleen:  Within normal limits in size and appearance. Adrenals/Urinary Tract: Normal adrenal glands. Small left kidney cysts are identified which measure up to 8 mm. No follow-up imaging recommended. Stomach/Bowel: Visualized portions within the abdomen are unremarkable. Vascular/Lymphatic: Normal appearance of the abdominal aorta. Patent upper abdominal vascularity. 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 Enlarged lymph nodes within the hepatic duodenal ligament and retroperitoneum are again noted. Other: Mild perihepatic ascites and perisplenic ascites. Similar to 12/31/22 Musculoskeletal: No suspicious bone lesions identified. IMPRESSION: 1. Again noted are multifocal hypoenhancing liver lesions suspicious for metastatic disease. The dominant lesion within the lateral dome of liver is unchanged from previous exam. 2. Mild intrahepatic bile duct dilatation with abrupt termination of the converging intrahepatic bile loops centrally suggesting underlying central obstructing lesion versus mass effect from enlarged portahepatic lymph nodes. The common bile duct appears nondilated. Consider further evaluation with ERCP. 3.  Gallstones with mild gallbladder wall thickening. 4. Enlarged lymph nodes within the hepatic duodenal ligament and  retroperitoneum are again noted. Concerning for nodal metastasis. 5. Mild perihepatic ascites and perisplenic ascites. Electronically Signed   By: Signa Kell M.D.   On: 01/08/2023 06:44   MR 3D Recon At Scanner  Result Date: 01/08/2023 CLINICAL DATA:  Jaundice.  Liver metastasis. EXAM: MRI ABDOMEN WITHOUT AND WITH CONTRAST (INCLUDING MRCP) TECHNIQUE: Multiplanar multisequence MR imaging of the abdomen was performed both before and after the administration of intravenous contrast. Heavily T2-weighted images of the biliary and pancreatic ducts were obtained, and three-dimensional MRCP images were rendered by post processing. CONTRAST:  10mL GADAVIST GADOBUTROL 1 MMOL/ML IV SOLN COMPARISON:  12/31/2022 FINDINGS: Lower chest: Trace right pleural fluid. Hepatobiliary: As noted on recent MRIs there are multifocal hypoenhancing liver lesions suspicious for metastatic disease. Index lesion within the lateral dome measures 2.6 x 1.7 cm, image 16/18. This is unchanged from 12/22/2022. Index lesion within the posteroinferior right lobe of liver measures 1.8 x 1.3 cm, image 44/18. Previously 1.6 x 1.2 cm. Also in the posterior right lobe of liver there is a 1.4 by 0.9 cm lesion. Previously 1.1 x 0.9 cm. Small gallstones. Mild gallbladder wall thickening measures up to 3 mm. Trace pericholecystic fluid. Mild intrahepatic bile duct dilatation. Unchanged from 12/22/2022. On the MRCP mid images the there is abrupt termination of the converging intrahepatic bile loops centrally suggesting underlying central obstructing lesion, image 4/12. The common bile duct appears nondilated. Pancreas: No mass, inflammatory changes, or other parenchymal abnormality identified. Spleen:  Within normal limits in size and appearance. Adrenals/Urinary Tract: Normal adrenal glands. Small left kidney cysts are identified which measure up to 8 mm. No follow-up imaging recommended. Stomach/Bowel: Visualized portions within the abdomen are  unremarkable. Vascular/Lymphatic: Normal appearance of the abdominal aorta. Patent upper abdominal vascularity. 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 Enlarged lymph nodes within the hepatic duodenal ligament and retroperitoneum are again noted. Other: Mild perihepatic ascites and perisplenic ascites. Similar to 12/31/22 Musculoskeletal: No suspicious bone lesions identified. IMPRESSION: 1. Again noted are multifocal hypoenhancing liver lesions suspicious for metastatic disease. The dominant lesion within the lateral dome of liver is unchanged from previous exam. 2. Mild intrahepatic bile duct dilatation with abrupt termination of the converging intrahepatic bile loops centrally suggesting underlying central obstructing lesion versus mass effect from enlarged portahepatic lymph nodes. The common bile duct appears nondilated. Consider further evaluation with ERCP. 3. Gallstones with mild gallbladder wall thickening. 4. Enlarged lymph nodes within the hepatic duodenal ligament and retroperitoneum are again noted. Concerning for nodal metastasis. 5. Mild perihepatic ascites and perisplenic ascites. Electronically Signed   By: Signa Kell M.D.   On: 01/08/2023 06:44   MR 3D Recon At Scanner  Result Date: 12/31/2022 : CLINICAL DATA: Elevated liver enzymes, multiple liver lesions recently biopsied EXAM: MRI ABDOMEN WITHOUT AND WITH CONTRAST (INCLUDING MRCP) TECHNIQUE: Multiplanar multisequence MR imaging of the abdomen was performed both before and after the administration of intravenous contrast. Heavily T2-weighted images of the biliary and pancreatic ducts were obtained, and three-dimensional MRCP images were rendered by post processing. CONTRAST: 10mL GADAVIST GADOBUTROL 1 MMOL/ML IV SOLN COMPARISON: 12/22/2022 FINDINGS: Lower chest: No acute abnormality. Hepatobiliary: Multiple hypoenhancing liver lesions are again seen, the largest within the peripheral  liver dome, hepatic segments VII and VIII (series 18, image 11). There are multiple additional smaller hypoenhancing lesions scattered throughout the liver, predominantly in  the inferior right lobe, not described by prior examination but unchanged (series 18, image 38, 25). Small gallstones (series 8, image 20). No gallbladder wall thickening, or biliary dilatation. Pancreas: Unremarkable. No pancreatic ductal dilatation or surrounding inflammatory changes. Spleen: Normal in size without significant abnormality. Adrenals/Urinary Tract: Adrenal glands are unremarkable. Kidneys are normal, without renal calculi, solid lesion, or hydronephrosis. Stomach/Bowel: Stomach is within normal limits. No evidence of bowel wall thickening, distention, or inflammatory changes. Vascular/Lymphatic: No significant vascular findings are present. Unchanged bulky, matted gastrohepatic ligament, celiac axis, portacaval, retroperitoneal, and small bowel mesenteric lymph nodes. Other: No abdominal wall hernia or abnormality. Trace perihepatic ascites. Musculoskeletal: No acute or significant osseous findings. IMPRESSION: 1. Multiple hypoenhancing liver lesions are again seen, the largest within the peripheral liver dome, hepatic segments VII and VIII. There are multiple additional smaller hypoenhancing lesions scattered throughout the liver, predominantly in the inferior right lobe, not described by prior examination but unchanged. Findings remain most consistent with metastases. Correlate with biopsy findings. 2. Unchanged bulky, matted abdominal lymphadenopathy, consistent with nodal metastatic disease. 3. Cholelithiasis without evidence of acute cholecystitis. 4. No biliary ductal dilatation. 5. Trace perihepatic ascites. Electronically Signed   By: Jearld Lesch M.D.   On: 12/31/2022 12:40   MR ABDOMEN MRCP W WO CONTAST  Result Date: 12/31/2022 CLINICAL DATA:  Elevated liver enzymes, multiple liver lesions recently biopsied EXAM:  MRI ABDOMEN WITHOUT AND WITH CONTRAST (INCLUDING MRCP) TECHNIQUE: Multiplanar multisequence MR imaging of the abdomen was performed both before and after the administration of intravenous contrast. Heavily T2-weighted images of the biliary and pancreatic ducts were obtained, and three-dimensional MRCP images were rendered by post processing. CONTRAST:  10mL GADAVIST GADOBUTROL 1 MMOL/ML IV SOLN COMPARISON:  12/22/2022 FINDINGS: Lower chest: No acute abnormality. Hepatobiliary: Multiple hypoenhancing liver lesions are again seen, the largest within the peripheral liver dome, hepatic segments VII and VIII (series 18, image 11). There are multiple additional smaller hypoenhancing lesions scattered throughout the liver, predominantly in the inferior right lobe, not described by prior examination but unchanged (series 18, image 38, 25). Small gallstones (series 8, image 20). No gallbladder wall thickening, or biliary dilatation. Pancreas: Unremarkable. No pancreatic ductal dilatation or surrounding inflammatory changes. Spleen: Normal in size without significant abnormality. Adrenals/Urinary Tract: Adrenal glands are unremarkable. Kidneys are normal, without renal calculi, solid lesion, or hydronephrosis. Stomach/Bowel: Stomach is within normal limits. No evidence of bowel wall thickening, distention, or inflammatory changes. Vascular/Lymphatic: No significant vascular findings are present. Unchanged bulky, matted gastrohepatic ligament, celiac axis, portacaval, retroperitoneal, and small bowel mesenteric lymph nodes. Other: No abdominal wall hernia or abnormality. Trace perihepatic ascites. Musculoskeletal: No acute or significant osseous findings. IMPRESSION: 1. Multiple hypoenhancing liver lesions are again seen, the largest within the peripheral liver dome, hepatic segments VII and VIII. There are multiple additional smaller hypoenhancing lesions scattered throughout the liver, predominantly in the inferior right  lobe, not described by prior examination but unchanged. Findings remain most consistent with metastases. Correlate with biopsy findings. 2. Unchanged bulky, matted abdominal lymphadenopathy, consistent with nodal metastatic disease. 3. Cholelithiasis without evidence of acute cholecystitis. 4. No biliary ductal dilatation. 5. Trace perihepatic ascites. Electronically Signed   By: Jearld Lesch M.D.   On: 12/31/2022 12:18   US THYROID  Result Date: 12/31/2022 CLINICAL DATA:  Incidental on CT. EXAM: THYROID ULTRASOUND TECHNIQUE: Ultrasound examination of the thyroid gland and adjacent soft tissues was performed. COMPARISON:  None Available. FINDINGS: Parenchymal Echotexture: Moderately heterogenous Isthmus: 1.0 cm Right lobe: 5.7 x  2.7 x 3.1 cm Left lobe: 4.3 x 1.7 x 2.3 cm _________________________________________________________ Estimated total number of nodules >/= 1 cm: 3 Number of spongiform nodules >/=  2 cm not described below (TR1): 0 Number of mixed cystic and solid nodules >/= 1.5 cm not described below (TR2): 0 _________________________________________________________ Nodule # 1: Isoechoic ill-defined solid nodule in the right mid gland measures 2.1 x 1.9 x 1.6 cm. No evidence of calcifications. Findings are consistent with TI-RADS category 3. *Given size (>/= 1.5 - 2.4 cm) and appearance, a follow-up ultrasound in 1 year should be considered based on TI-RADS criteria. Nodule # 2: Smaller 1.4 cm isoechoic solid nodule versus pseudo nodule in the right inferior gland. Given size (<1.4 cm) and appearance, this nodule does NOT meet TI-RADS criteria for biopsy or dedicated follow-up. Nodule # 3: Ill-defined 1.3 cm hypoechoic solid nodule in the left inferior gland. TI-RADS category 4. *Given size (>/= 1 - 1.4 cm) and appearance, a follow-up ultrasound in 1 year should be considered based on TI-RADS criteria. IMPRESSION: 1. Enlarged, heterogeneous and multinodular thyroid gland. 2. Nodule # 1 is a 2.1 cm  TI-RADS category 3 nodule in the right mid gland meets criteria for imaging surveillance. Recommend follow-up ultrasound in 1 year. 3. Nodule # 3 is a 1.3 cm TI-RADS category 4 nodule in the left inferior gland and also meets criteria for imaging surveillance. The above is in keeping with the ACR TI-RADS recommendations - J Am Coll Radiol 2017;14:587-595. Electronically Signed   By: Malachy Moan M.D.   On: 12/31/2022 06:55     ASSESSMENT/PLAN:  This is a very pleasant 72 year old African American female with   Stage IV Intrahepatic Cholangiocarcinoma, diagnosed in June 2024 -The patient presented with fever, nausea vomiting to hospital on 12/20/22, was found to have Klebsiella bacteremia, CT and abdominal MRI showed multiple (at least 3) liver lesions, bulky abdominal and retroperitoneal adenopathy, and multiple small lung nodules up to 5 mm, concerning for metastatic malignancy.   -gallbladder appears diffusely thickened, with multiple small stones.  -Admitted from clinic 12/30/22 for hyperbilirubinemia 9, EUS with FNA of peripancreatic and portal LNs again negative  -Admitted to the hospital from the clinic again on 01/07/23 due to appearing ill, dehydrated, and worsening hyperbilirubinemia -ERCP x 2 in hospital- found to have biliary strictures that have been stented. Bilirubin intiallly downtrend but then stabilized/mildly increased.  -IR attempted percutaneous drain which was unsuccessful.  -The patient underwent several biopsies that were inconclusive. Eventually, the FNA of the liver on 01/13/23 showed adenocarcinoma -Given her radiographic distribution of her disease, IHC study results, and negative EGD except due to anal adenoma with high-grade dysplasia, Dr. Mosetta Putt feels this is most consistent with intrahepatic cholangiocarcinoma.  -Unfortunately she has multiple metastasis to liver, thoracic and abdominal lymph nodes and possible lung metastasis. This is not curable disease.  -Due to her  continued hepatic dysfunction and poor performance status, she is not a good candidate for chemotherapy.  -The patient was seen with Dr. Mosetta Putt today.  -Dr. Mosetta Putt again explained the overall poor prognosis. Her LFTs today show continued hyperbilirubinemia with a bilirubin of 18.1. Given her overall poor functional status and hepatic dysfunction, Dr. Mosetta Putt recommends comfort care with palliative/hospice.  Patient would likely not tolerate chemotherapy.   -Of course, we are still waiting for the results of [place] and MMR.  If the patient has any findings for additional therapies then we will call the patient and let her know.  If the  patient was a candidate for immunotherapy, Dr. Mosetta Putt mentioned it may take several months to show improvement in her condition.   -The patient voiced a good understanding. She is in agreement with the plan.  -I have placed a referral to hospice -We will call them today to ensure they received the referral so they may schedule a visit in the next few days -No follow-up is needed.  We will see the patient on an as-needed basis  Poor P.O intake/decreased appetite/fatigue/N/V -Patient's main symptom today is related to fatigue and poor appetite. -Mild elevation in her creatinine to 1.24 today.  I offered IV fluids but the patient declined -Calcium is slightly low, I have sent a few tablets of potassium chloride.  Educated the patient she may crush this if needed and take in applesauce -Will try boost breeze -Sent her prescription for Remeron to take at nighttime which may help her appetite and her anxiety/situational depression. -So sent Compazine to alternate with Zofran if needed for nausea and vomiting.  Diarrhea, intractable - Unclear etiology - does not appear infectious. Possibly in the setting of above biliary obstruction vs antibiotic administration per hospital discharge summary - Imodium PRN loose stool -The patient has only been taking this once per day, we reviewed  the package insert instructions she may take up to 16 mg daily  Esophagitis and erosive gastritis - Continue PPI - Sucralfate started in the hospital  - Continue additional Pepcid  Plan: -The patient was seen with Dr. Mosetta Putt today -Repeat labs today -Referral to hospice -Prescription for Compazine and Remeron sent to pharmacy -Refill potassium -Declined IV fluids -No follow-up needed but we will be happy to see the patient on an as-needed basis  No orders of the defined types were placed in this encounter.    Regina Kats L Eivin Mascio, Regina Mckay 01/22/23  Addendum I have seen the patient, examined her. I agree with the assessment and and plan and have edited the notes.   I again reviewed her hospital course and workup findings.  Her liver biopsy confirmed adenocarcinoma, unfortunately her primary site is still uncertain,  primary intrahepatic cholangiocarcinoma is likely but GI primary is not ruled out completely given her large duodenal mass (biopsy showed high-grade dysplasia) or colon cancer (colonoscopy has not done).  Unfortunately despite multiple ERCP, her hyperbilirubinemia continues getting worse.  She has low performance status, low appetite, with high-grade of hyperbilirulinemia, she is not a candidate for chemotherapy.  I discussed the incurable nature of her metastatic disease, and overall poor prognosis due to her liver failure. Her life expectancy is likely a few weeks to a few months.  I recommend home palliative care and hospice, Stacks and benefits discussed with patient and her family, they are in agreement.  Referral was made today.  I will continue to be her attending when she is under hospice care.  A total of 40 mins spent for her visit today.   Malachy Mood MD 01/23/2023

## 2023-01-23 ENCOUNTER — Telehealth: Payer: Self-pay

## 2023-01-23 ENCOUNTER — Inpatient Hospital Stay: Payer: Federal, State, Local not specified - PPO | Admitting: Physician Assistant

## 2023-01-23 ENCOUNTER — Other Ambulatory Visit: Payer: Self-pay

## 2023-01-23 ENCOUNTER — Inpatient Hospital Stay: Payer: Federal, State, Local not specified - PPO

## 2023-01-23 ENCOUNTER — Telehealth: Payer: Self-pay | Admitting: *Deleted

## 2023-01-23 VITALS — BP 131/80 | HR 78 | Temp 98.2°F | Resp 18 | Wt 228.2 lb

## 2023-01-23 DIAGNOSIS — Z7189 Other specified counseling: Secondary | ICD-10-CM | POA: Diagnosis not present

## 2023-01-23 DIAGNOSIS — C221 Intrahepatic bile duct carcinoma: Secondary | ICD-10-CM

## 2023-01-23 DIAGNOSIS — E876 Hypokalemia: Secondary | ICD-10-CM | POA: Diagnosis not present

## 2023-01-23 DIAGNOSIS — R630 Anorexia: Secondary | ICD-10-CM

## 2023-01-23 DIAGNOSIS — K769 Liver disease, unspecified: Secondary | ICD-10-CM

## 2023-01-23 LAB — CMP (CANCER CENTER ONLY)
ALT: 22 U/L (ref 0–44)
AST: 60 U/L — ABNORMAL HIGH (ref 15–41)
Albumin: 2.7 g/dL — ABNORMAL LOW (ref 3.5–5.0)
Alkaline Phosphatase: 152 U/L — ABNORMAL HIGH (ref 38–126)
Anion gap: 7 (ref 5–15)
BUN: 13 mg/dL (ref 8–23)
CO2: 29 mmol/L (ref 22–32)
Calcium: 8.3 mg/dL — ABNORMAL LOW (ref 8.9–10.3)
Chloride: 100 mmol/L (ref 98–111)
Creatinine: 1.24 mg/dL — ABNORMAL HIGH (ref 0.44–1.00)
GFR, Estimated: 46 mL/min — ABNORMAL LOW (ref >60)
Glucose, Bld: 131 mg/dL — ABNORMAL HIGH (ref 70–99)
Potassium: 3.3 mmol/L — ABNORMAL LOW (ref 3.5–5.1)
Sodium: 136 mmol/L (ref 135–145)
Total Bilirubin: 18 mg/dL (ref 0.3–1.2)
Total Protein: 6 g/dL — ABNORMAL LOW (ref 6.5–8.1)

## 2023-01-23 LAB — CBC WITH DIFFERENTIAL (CANCER CENTER ONLY)
Abs Immature Granulocytes: 0.11 10*3/uL — ABNORMAL HIGH (ref 0.00–0.07)
Basophils Absolute: 0.1 10*3/uL (ref 0.0–0.1)
Basophils Relative: 1 %
Eosinophils Absolute: 0 10*3/uL (ref 0.0–0.5)
Eosinophils Relative: 0 %
HCT: 39.7 % (ref 36.0–46.0)
Hemoglobin: 14.1 g/dL (ref 12.0–15.0)
Immature Granulocytes: 1 %
Lymphocytes Relative: 7 %
Lymphs Abs: 0.8 10*3/uL (ref 0.7–4.0)
MCH: 30.5 pg (ref 26.0–34.0)
MCHC: 35.5 g/dL (ref 30.0–36.0)
MCV: 85.9 fL (ref 80.0–100.0)
Monocytes Absolute: 1.3 10*3/uL — ABNORMAL HIGH (ref 0.1–1.0)
Monocytes Relative: 11 %
Neutro Abs: 9.2 10*3/uL — ABNORMAL HIGH (ref 1.7–7.7)
Neutrophils Relative %: 80 %
Platelet Count: 319 10*3/uL (ref 150–400)
RBC: 4.62 MIL/uL (ref 3.87–5.11)
RDW: 18.1 % — ABNORMAL HIGH (ref 11.5–15.5)
WBC Count: 11.5 10*3/uL — ABNORMAL HIGH (ref 4.0–10.5)
nRBC: 0 % (ref 0.0–0.2)

## 2023-01-23 LAB — MISCELLANEOUS TEST

## 2023-01-23 LAB — SAMPLE TO BLOOD BANK

## 2023-01-23 MED ORDER — POTASSIUM CHLORIDE CRYS ER 20 MEQ PO TBCR: 20.0000 meq | EXTENDED_RELEASE_TABLET | Freq: Every day | ORAL | 0 refills | Status: DC

## 2023-01-23 MED ORDER — MIRTAZAPINE 15 MG PO TABS
15.0000 mg | ORAL_TABLET | Freq: Every day | ORAL | 2 refills | Status: DC
Start: 2023-01-23 — End: 2023-02-09

## 2023-01-23 MED ORDER — PROCHLORPERAZINE MALEATE 10 MG PO TABS
10.0000 mg | ORAL_TABLET | Freq: Four times a day (QID) | ORAL | 2 refills | Status: DC | PRN
Start: 2023-01-23 — End: 2023-02-09

## 2023-01-23 NOTE — Telephone Encounter (Signed)
Critical Lab Value Reported:  Tbili 18.0 today.  Notified Dr. Mosetta Putt and Cassandra Heilingoetter, PA-C (pt being seen by Cassandra in clinic today).

## 2023-01-23 NOTE — Progress Notes (Signed)
I emailed financial assistance application to [employer].

## 2023-01-23 NOTE — Telephone Encounter (Signed)
Called in referral to [facility]. Stated they will give family time to get home and call to set up home visit. Made family aware in office that they will be contacted. Family verbalized understanding.

## 2023-01-23 NOTE — Patient Instructions (Addendum)
-  It was very nice meeting you today.  I know we covered a lot of important information.  Here is a summary of the main take away points. -The biopsy of the liver that you had done on 01/13/2023 finally was able to get [place] an answer about what is going on. Thank you for being a trooper and bearing with [place] as we try to get an answer to your condition. The biopsies showed cancer called adenocarcinoma.  Dr. Mosetta Putt believes that the primary site of the cancer came from the bile ducts in the liver.  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 -Unfortunately, because of the liver dysfunction chemotherapy would likely not be a good option for you because it can make you feel more tired, weak, fatigued, and there are concerns about your body being able to properly metabolize the chemo medicine with the liver dysfunction.  -Unfortunately, we are unable to get significant improvement in your bilirubin with the prior stent placements and the biliary drain.  Therefore, your bilirubin continues to be high 18 today (normal is than 1.2)  -We are still checking other options on tests that were requested last week and the blood test today.  Unfortunately, the likelihood of this showing any good options is not very high but we will still check anyway.  We will call you if we get results and feel like there is something that could be a good option for you. -The meantime, we will refer due to [facility] palliative care and hospice.  We will call them today to ensure that they evaluate you promptly  A nurse will come out to your house and review the services they offer and help you with your needs.  We are hopeful that they will be in contact with you in the next day or two. -I sent you prescription for Remeron which is an antidepressant can help you sleep at night and increase her appetite.  If hospice and palliative care thinks that you do not need this medication, then you do not need to pick it up.  I sent you Compazine to  alternate with the Zofran if needed for nausea and vomiting. -These review the package insert for Imodium.  You can take up to 8 tablets/day (16 milligrams is the max dose) -Please don't hesitate to reach out to [place] if you need help with anything.

## 2023-01-26 ENCOUNTER — Ambulatory Visit: Payer: Federal, State, Local not specified - PPO | Admitting: Physician Assistant

## 2023-01-26 ENCOUNTER — Encounter: Payer: Self-pay | Admitting: Physician Assistant

## 2023-01-26 ENCOUNTER — Other Ambulatory Visit: Payer: Federal, State, Local not specified - PPO

## 2023-01-29 ENCOUNTER — Encounter (HOSPITAL_COMMUNITY): Payer: Self-pay

## 2023-02-04 ENCOUNTER — Encounter (HOSPITAL_COMMUNITY): Payer: Self-pay | Admitting: Hematology

## 2023-02-09 ENCOUNTER — Other Ambulatory Visit: Payer: Self-pay

## 2023-02-12 ENCOUNTER — Ambulatory Visit: Payer: Federal, State, Local not specified - PPO | Admitting: Nurse Practitioner

## 2023-02-17 ENCOUNTER — Other Ambulatory Visit: Payer: Self-pay | Admitting: Nurse Practitioner

## 2023-02-17 DIAGNOSIS — Z794 Long term (current) use of insulin: Secondary | ICD-10-CM

## 2023-02-17 DIAGNOSIS — I10 Essential (primary) hypertension: Secondary | ICD-10-CM

## 2023-03-05 DEATH — deceased

## 2023-04-09 ENCOUNTER — Ambulatory Visit: Payer: Federal, State, Local not specified - PPO | Admitting: Neurology

## 2023-05-07 ENCOUNTER — Ambulatory Visit: Payer: Federal, State, Local not specified - PPO | Admitting: Internal Medicine
